# Patient Record
Sex: Male | Born: 1950
Health system: Southern US, Community
[De-identification: ages and names within clinical notes are randomized; demographics above are authoritative.]

## PROBLEM LIST (undated history)

## (undated) DIAGNOSIS — T7840XA Allergy, unspecified, initial encounter: Secondary | ICD-10-CM

## (undated) DIAGNOSIS — M0520 Rheumatoid vasculitis with rheumatoid arthritis of unspecified site: Secondary | ICD-10-CM

## (undated) DIAGNOSIS — R739 Hyperglycemia, unspecified: Secondary | ICD-10-CM

## (undated) DIAGNOSIS — E039 Hypothyroidism, unspecified: Secondary | ICD-10-CM

## (undated) DIAGNOSIS — I10 Essential (primary) hypertension: Secondary | ICD-10-CM

## (undated) DIAGNOSIS — M1990 Unspecified osteoarthritis, unspecified site: Secondary | ICD-10-CM

## (undated) DIAGNOSIS — D869 Sarcoidosis, unspecified: Secondary | ICD-10-CM

## (undated) DIAGNOSIS — G4730 Sleep apnea, unspecified: Secondary | ICD-10-CM

## (undated) DIAGNOSIS — R599 Enlarged lymph nodes, unspecified: Principal | ICD-10-CM

## (undated) DIAGNOSIS — R748 Abnormal levels of other serum enzymes: Secondary | ICD-10-CM

## (undated) DIAGNOSIS — E042 Nontoxic multinodular goiter: Secondary | ICD-10-CM

## (undated) DIAGNOSIS — K219 Gastro-esophageal reflux disease without esophagitis: Secondary | ICD-10-CM

## (undated) DIAGNOSIS — Z0000 Encounter for general adult medical examination without abnormal findings: Secondary | ICD-10-CM

## (undated) HISTORY — DX: Unspecified osteoarthritis, unspecified site: M19.90

## (undated) HISTORY — DX: Rheumatoid vasculitis with rheumatoid arthritis of unspecified site: M05.20

## (undated) HISTORY — PX: POLYPECTOMY: SHX149

## (undated) HISTORY — PX: ROTATOR CUFF REPAIR: SHX139

## (undated) HISTORY — DX: Hyperglycemia, unspecified: R73.9

## (undated) HISTORY — DX: Sarcoidosis, unspecified: D86.9

## (undated) HISTORY — DX: Sleep apnea, unspecified: G47.30

## (undated) HISTORY — PX: COLONOSCOPY: SHX174

## (undated) HISTORY — DX: Allergy, unspecified, initial encounter: T78.40XA

## (undated) HISTORY — DX: Essential (primary) hypertension: I10

## (undated) HISTORY — DX: Nontoxic multinodular goiter: E04.2

## (undated) HISTORY — DX: Hypothyroidism, unspecified: E03.9

## (undated) HISTORY — DX: Encounter for general adult medical examination without abnormal findings: Z00.00

## (undated) HISTORY — DX: Gastro-esophageal reflux disease without esophagitis: K21.9

## (undated) HISTORY — DX: Enlarged lymph nodes, unspecified: R59.9

## (undated) HISTORY — DX: Abnormal levels of other serum enzymes: R74.8

---

## 1997-06-26 ENCOUNTER — Ambulatory Visit: Admission: RE | Admit: 1997-06-26 | Discharge: 1997-06-26 | Payer: Self-pay | Admitting: Otolaryngology

## 1997-08-13 ENCOUNTER — Other Ambulatory Visit: Admission: RE | Admit: 1997-08-13 | Discharge: 1997-08-13 | Payer: Self-pay | Admitting: Urology

## 1997-10-03 ENCOUNTER — Ambulatory Visit: Admission: RE | Admit: 1997-10-03 | Discharge: 1997-10-03 | Payer: Self-pay | Admitting: Otolaryngology

## 1999-10-14 ENCOUNTER — Encounter: Payer: Self-pay | Admitting: Endocrinology

## 1999-10-14 ENCOUNTER — Ambulatory Visit (HOSPITAL_COMMUNITY): Admission: RE | Admit: 1999-10-14 | Discharge: 1999-10-14 | Payer: Self-pay | Admitting: Endocrinology

## 2000-02-28 ENCOUNTER — Encounter: Payer: Self-pay | Admitting: Otolaryngology

## 2000-02-29 ENCOUNTER — Encounter (INDEPENDENT_AMBULATORY_CARE_PROVIDER_SITE_OTHER): Payer: Self-pay | Admitting: *Deleted

## 2000-02-29 ENCOUNTER — Ambulatory Visit (HOSPITAL_COMMUNITY): Admission: RE | Admit: 2000-02-29 | Discharge: 2000-02-29 | Payer: Self-pay | Admitting: Otolaryngology

## 2000-03-13 HISTORY — PX: THYROIDECTOMY: SHX17

## 2000-03-14 ENCOUNTER — Ambulatory Visit (HOSPITAL_COMMUNITY): Admission: RE | Admit: 2000-03-14 | Discharge: 2000-03-14 | Payer: Self-pay | Admitting: Otolaryngology

## 2000-03-28 ENCOUNTER — Encounter (INDEPENDENT_AMBULATORY_CARE_PROVIDER_SITE_OTHER): Payer: Self-pay | Admitting: Specialist

## 2000-03-28 ENCOUNTER — Inpatient Hospital Stay (HOSPITAL_COMMUNITY): Admission: RE | Admit: 2000-03-28 | Discharge: 2000-03-30 | Payer: Self-pay | Admitting: Otolaryngology

## 2000-05-03 ENCOUNTER — Encounter (INDEPENDENT_AMBULATORY_CARE_PROVIDER_SITE_OTHER): Payer: Self-pay | Admitting: Specialist

## 2000-05-04 ENCOUNTER — Inpatient Hospital Stay (HOSPITAL_COMMUNITY): Admission: EM | Admit: 2000-05-04 | Discharge: 2000-05-07 | Payer: Self-pay | Admitting: Internal Medicine

## 2000-05-06 ENCOUNTER — Encounter: Payer: Self-pay | Admitting: Internal Medicine

## 2000-05-07 ENCOUNTER — Encounter: Payer: Self-pay | Admitting: Internal Medicine

## 2004-02-11 ENCOUNTER — Ambulatory Visit: Payer: Self-pay | Admitting: Endocrinology

## 2004-02-12 ENCOUNTER — Ambulatory Visit: Payer: Self-pay | Admitting: Internal Medicine

## 2004-04-05 ENCOUNTER — Ambulatory Visit: Payer: Self-pay | Admitting: Endocrinology

## 2004-05-13 ENCOUNTER — Encounter: Admission: RE | Admit: 2004-05-13 | Discharge: 2004-05-13 | Payer: Self-pay | Admitting: Otolaryngology

## 2004-08-04 ENCOUNTER — Ambulatory Visit: Payer: Self-pay | Admitting: Internal Medicine

## 2004-09-09 ENCOUNTER — Ambulatory Visit: Payer: Self-pay | Admitting: Internal Medicine

## 2004-09-09 ENCOUNTER — Encounter (INDEPENDENT_AMBULATORY_CARE_PROVIDER_SITE_OTHER): Payer: Self-pay | Admitting: Specialist

## 2004-09-09 ENCOUNTER — Inpatient Hospital Stay (HOSPITAL_COMMUNITY): Admission: RE | Admit: 2004-09-09 | Discharge: 2004-09-11 | Payer: Self-pay | Admitting: Otolaryngology

## 2004-12-23 ENCOUNTER — Encounter (INDEPENDENT_AMBULATORY_CARE_PROVIDER_SITE_OTHER): Payer: Self-pay | Admitting: Otolaryngology

## 2004-12-23 ENCOUNTER — Encounter (INDEPENDENT_AMBULATORY_CARE_PROVIDER_SITE_OTHER): Payer: Self-pay | Admitting: Specialist

## 2004-12-23 ENCOUNTER — Ambulatory Visit (HOSPITAL_COMMUNITY): Admission: RE | Admit: 2004-12-23 | Discharge: 2004-12-23 | Payer: Self-pay | Admitting: Otolaryngology

## 2005-01-19 ENCOUNTER — Ambulatory Visit: Payer: Self-pay | Admitting: Oncology

## 2005-02-17 ENCOUNTER — Ambulatory Visit (HOSPITAL_COMMUNITY): Admission: RE | Admit: 2005-02-17 | Discharge: 2005-02-17 | Payer: Self-pay | Admitting: Oncology

## 2005-02-21 ENCOUNTER — Ambulatory Visit: Payer: Self-pay | Admitting: Internal Medicine

## 2005-08-23 ENCOUNTER — Ambulatory Visit: Payer: Self-pay | Admitting: Oncology

## 2005-08-24 ENCOUNTER — Ambulatory Visit (HOSPITAL_COMMUNITY): Admission: RE | Admit: 2005-08-24 | Discharge: 2005-08-24 | Payer: Self-pay | Admitting: Oncology

## 2005-08-25 LAB — CBC WITH DIFFERENTIAL/PLATELET
BASO%: 0.3 % (ref 0.0–2.0)
Basophils Absolute: 0 10*3/uL (ref 0.0–0.1)
EOS%: 2.4 % (ref 0.0–7.0)
HGB: 16.5 g/dL (ref 13.0–17.1)
MCH: 31 pg (ref 28.0–33.4)
MCHC: 34.2 g/dL (ref 32.0–35.9)
MONO#: 0.7 10*3/uL (ref 0.1–0.9)
RDW: 13.7 % (ref 11.2–14.6)
WBC: 8.1 10*3/uL (ref 4.0–10.0)
lymph#: 3.2 10*3/uL (ref 0.9–3.3)

## 2005-08-25 LAB — MORPHOLOGY
PLT EST: ADEQUATE
RBC Comments: NORMAL

## 2005-10-06 ENCOUNTER — Ambulatory Visit: Payer: Self-pay | Admitting: Endocrinology

## 2005-10-23 ENCOUNTER — Ambulatory Visit: Payer: Self-pay | Admitting: Endocrinology

## 2005-11-30 ENCOUNTER — Ambulatory Visit: Payer: Self-pay | Admitting: Endocrinology

## 2005-12-04 ENCOUNTER — Ambulatory Visit: Payer: Self-pay | Admitting: Endocrinology

## 2006-01-16 ENCOUNTER — Ambulatory Visit: Payer: Self-pay | Admitting: Endocrinology

## 2006-01-27 ENCOUNTER — Emergency Department (HOSPITAL_COMMUNITY): Admission: EM | Admit: 2006-01-27 | Discharge: 2006-01-27 | Payer: Self-pay | Admitting: Emergency Medicine

## 2006-02-15 ENCOUNTER — Ambulatory Visit: Payer: Self-pay | Admitting: Oncology

## 2006-03-22 ENCOUNTER — Ambulatory Visit (HOSPITAL_COMMUNITY): Admission: RE | Admit: 2006-03-22 | Discharge: 2006-03-22 | Payer: Self-pay | Admitting: Oncology

## 2006-03-26 ENCOUNTER — Ambulatory Visit: Payer: Self-pay | Admitting: Endocrinology

## 2006-03-26 LAB — CONVERTED CEMR LAB
Hgb A1c MFr Bld: 5.8 % (ref 4.6–6.0)
TSH: 14.99 microintl units/mL — ABNORMAL HIGH (ref 0.35–5.50)

## 2006-04-18 ENCOUNTER — Ambulatory Visit: Payer: Self-pay

## 2006-05-10 ENCOUNTER — Ambulatory Visit: Payer: Self-pay | Admitting: Endocrinology

## 2006-05-10 LAB — CONVERTED CEMR LAB
Basophils Relative: 0.9 % (ref 0.0–1.0)
Eosinophils Absolute: 0.2 10*3/uL (ref 0.0–0.6)
Lymphocytes Relative: 43.9 % (ref 12.0–46.0)
Monocytes Relative: 10.4 % (ref 3.0–11.0)
Neutro Abs: 3.3 10*3/uL (ref 1.4–7.7)
Platelets: 240 10*3/uL (ref 150–400)
TSH: 0.86 microintl units/mL (ref 0.35–5.50)

## 2006-05-28 ENCOUNTER — Ambulatory Visit: Payer: Self-pay | Admitting: Gastroenterology

## 2006-05-29 ENCOUNTER — Ambulatory Visit (HOSPITAL_COMMUNITY): Admission: RE | Admit: 2006-05-29 | Discharge: 2006-05-30 | Payer: Self-pay | Admitting: Orthopedic Surgery

## 2006-06-26 ENCOUNTER — Ambulatory Visit: Payer: Self-pay | Admitting: Endocrinology

## 2006-07-24 ENCOUNTER — Ambulatory Visit: Payer: Self-pay | Admitting: Gastroenterology

## 2006-08-03 ENCOUNTER — Ambulatory Visit: Payer: Self-pay | Admitting: Gastroenterology

## 2006-10-30 ENCOUNTER — Ambulatory Visit: Payer: Self-pay | Admitting: Endocrinology

## 2007-03-21 ENCOUNTER — Ambulatory Visit (HOSPITAL_COMMUNITY): Admission: RE | Admit: 2007-03-21 | Discharge: 2007-03-21 | Payer: Self-pay | Admitting: Oncology

## 2007-03-21 ENCOUNTER — Ambulatory Visit: Payer: Self-pay | Admitting: Oncology

## 2007-03-25 ENCOUNTER — Encounter: Payer: Self-pay | Admitting: Endocrinology

## 2007-05-16 ENCOUNTER — Telehealth: Payer: Self-pay | Admitting: Endocrinology

## 2007-05-29 ENCOUNTER — Ambulatory Visit: Payer: Self-pay | Admitting: Endocrinology

## 2007-05-30 LAB — CONVERTED CEMR LAB
AST: 29 units/L (ref 0–37)
Bilirubin, Direct: 0.2 mg/dL (ref 0.0–0.3)
Cholesterol: 152 mg/dL (ref 0–200)
Eosinophils Absolute: 0.4 10*3/uL (ref 0.0–0.6)
Eosinophils Relative: 3.8 % (ref 0.0–5.0)
GFR calc Af Amer: 90 mL/min
GFR calc non Af Amer: 74 mL/min
Glucose, Bld: 99 mg/dL (ref 70–99)
HCT: 47.4 % (ref 39.0–52.0)
Hemoglobin: 15.2 g/dL (ref 13.0–17.0)
Leukocytes, UA: NEGATIVE
Lymphocytes Relative: 43.6 % (ref 12.0–46.0)
MCV: 94.6 fL (ref 78.0–100.0)
Neutro Abs: 3.8 10*3/uL (ref 1.4–7.7)
Neutrophils Relative %: 39.5 % — ABNORMAL LOW (ref 43.0–77.0)
Nitrite: NEGATIVE
PSA: 0.72 ng/mL (ref 0.10–4.00)
Sodium: 139 meq/L (ref 135–145)
Total CHOL/HDL Ratio: 3.7
Total Protein: 6.9 g/dL (ref 6.0–8.3)
Urobilinogen, UA: 1 (ref 0.0–1.0)
WBC: 9.7 10*3/uL (ref 4.5–10.5)

## 2007-06-06 ENCOUNTER — Ambulatory Visit: Payer: Self-pay | Admitting: Endocrinology

## 2007-06-06 DIAGNOSIS — R7309 Other abnormal glucose: Secondary | ICD-10-CM | POA: Insufficient documentation

## 2007-06-06 DIAGNOSIS — E042 Nontoxic multinodular goiter: Secondary | ICD-10-CM | POA: Insufficient documentation

## 2007-06-06 DIAGNOSIS — J45909 Unspecified asthma, uncomplicated: Secondary | ICD-10-CM | POA: Insufficient documentation

## 2007-06-06 DIAGNOSIS — K649 Unspecified hemorrhoids: Secondary | ICD-10-CM | POA: Insufficient documentation

## 2007-06-06 DIAGNOSIS — J310 Chronic rhinitis: Secondary | ICD-10-CM | POA: Insufficient documentation

## 2007-06-06 DIAGNOSIS — A6000 Herpesviral infection of urogenital system, unspecified: Secondary | ICD-10-CM | POA: Insufficient documentation

## 2007-06-06 DIAGNOSIS — G4730 Sleep apnea, unspecified: Secondary | ICD-10-CM | POA: Insufficient documentation

## 2007-06-06 DIAGNOSIS — D869 Sarcoidosis, unspecified: Secondary | ICD-10-CM | POA: Insufficient documentation

## 2007-12-25 ENCOUNTER — Telehealth (INDEPENDENT_AMBULATORY_CARE_PROVIDER_SITE_OTHER): Payer: Self-pay | Admitting: *Deleted

## 2007-12-26 ENCOUNTER — Ambulatory Visit: Payer: Self-pay | Admitting: Internal Medicine

## 2007-12-27 ENCOUNTER — Ambulatory Visit: Payer: Self-pay | Admitting: Endocrinology

## 2007-12-27 DIAGNOSIS — M5137 Other intervertebral disc degeneration, lumbosacral region: Secondary | ICD-10-CM | POA: Insufficient documentation

## 2007-12-27 DIAGNOSIS — M51379 Other intervertebral disc degeneration, lumbosacral region without mention of lumbar back pain or lower extremity pain: Secondary | ICD-10-CM | POA: Insufficient documentation

## 2007-12-27 LAB — CONVERTED CEMR LAB
Albumin: 4 g/dL (ref 3.5–5.2)
Basophils Absolute: 0 10*3/uL (ref 0.0–0.1)
Basophils Relative: 0.4 % (ref 0.0–3.0)
CO2: 29 meq/L (ref 19–32)
Calcium: 9.5 mg/dL (ref 8.4–10.5)
Chloride: 105 meq/L (ref 96–112)
GFR calc Af Amer: 100 mL/min
Lymphocytes Relative: 40 % (ref 12.0–46.0)
MCHC: 33.7 g/dL (ref 30.0–36.0)
Neutrophils Relative %: 45.4 % (ref 43.0–77.0)
Platelets: 229 10*3/uL (ref 150–400)
Potassium: 4.6 meq/L (ref 3.5–5.1)
RBC: 5.11 M/uL (ref 4.22–5.81)
TSH: 0.18 microintl units/mL — ABNORMAL LOW (ref 0.35–5.50)
Total Protein: 7.1 g/dL (ref 6.0–8.3)
WBC: 9.3 10*3/uL (ref 4.5–10.5)

## 2007-12-30 LAB — CONVERTED CEMR LAB: Angiotensin 1 Converting Enzyme: 70 units/L — ABNORMAL HIGH (ref 9–67)

## 2008-01-03 ENCOUNTER — Telehealth (INDEPENDENT_AMBULATORY_CARE_PROVIDER_SITE_OTHER): Payer: Self-pay | Admitting: *Deleted

## 2008-02-13 ENCOUNTER — Ambulatory Visit: Payer: Self-pay | Admitting: Internal Medicine

## 2008-02-13 DIAGNOSIS — R635 Abnormal weight gain: Secondary | ICD-10-CM | POA: Insufficient documentation

## 2008-02-14 ENCOUNTER — Telehealth: Payer: Self-pay | Admitting: Endocrinology

## 2008-02-25 ENCOUNTER — Ambulatory Visit (HOSPITAL_COMMUNITY): Admission: RE | Admit: 2008-02-25 | Discharge: 2008-02-25 | Payer: Self-pay | Admitting: Oncology

## 2008-03-23 ENCOUNTER — Encounter: Payer: Self-pay | Admitting: Endocrinology

## 2008-03-23 ENCOUNTER — Ambulatory Visit: Payer: Self-pay | Admitting: Oncology

## 2008-04-16 ENCOUNTER — Encounter: Payer: Self-pay | Admitting: Endocrinology

## 2008-04-22 ENCOUNTER — Encounter: Admission: RE | Admit: 2008-04-22 | Discharge: 2008-04-22 | Payer: Self-pay | Admitting: Otolaryngology

## 2008-06-19 ENCOUNTER — Ambulatory Visit: Payer: Self-pay | Admitting: Internal Medicine

## 2008-07-06 ENCOUNTER — Ambulatory Visit: Payer: Self-pay | Admitting: Endocrinology

## 2008-07-06 LAB — CONVERTED CEMR LAB
ALT: 26 units/L (ref 0–53)
Bilirubin Urine: NEGATIVE
Calcium: 9.1 mg/dL (ref 8.4–10.5)
Cholesterol: 146 mg/dL (ref 0–200)
Eosinophils Relative: 4.2 % (ref 0.0–5.0)
GFR calc non Af Amer: 99.38 mL/min (ref >60)
HCT: 48.2 % (ref 39.0–52.0)
HDL: 39.6 mg/dL (ref >39.00)
Hemoglobin, Urine: NEGATIVE
Ketones, ur: NEGATIVE mg/dL
Leukocytes, UA: NEGATIVE
Lymphs Abs: 3.6 10*3/uL (ref 0.7–4.0)
MCV: 93.3 fL (ref 78.0–100.0)
Monocytes Absolute: 0.9 10*3/uL (ref 0.1–1.0)
PSA: 0.84 ng/mL (ref 0.10–4.00)
Platelets: 194 10*3/uL (ref 150.0–400.0)
RDW: 12.8 % (ref 11.5–14.6)
Sodium: 143 meq/L (ref 135–145)
Specific Gravity, Urine: 1.01 (ref 1.000–1.030)
TSH: 11.91 microintl units/mL — ABNORMAL HIGH (ref 0.35–5.50)
Total Bilirubin: 1.3 mg/dL — ABNORMAL HIGH (ref 0.3–1.2)
Triglycerides: 41 mg/dL (ref 0.0–149.0)
Urobilinogen, UA: 0.2 (ref 0.0–1.0)
WBC: 7.4 10*3/uL (ref 4.5–10.5)

## 2008-07-07 ENCOUNTER — Ambulatory Visit: Payer: Self-pay | Admitting: Endocrinology

## 2008-07-07 DIAGNOSIS — N401 Enlarged prostate with lower urinary tract symptoms: Secondary | ICD-10-CM | POA: Insufficient documentation

## 2008-07-07 DIAGNOSIS — E890 Postprocedural hypothyroidism: Secondary | ICD-10-CM | POA: Insufficient documentation

## 2008-11-02 ENCOUNTER — Ambulatory Visit: Payer: Self-pay | Admitting: Internal Medicine

## 2008-11-10 ENCOUNTER — Ambulatory Visit: Payer: Self-pay | Admitting: Internal Medicine

## 2008-11-10 DIAGNOSIS — L559 Sunburn, unspecified: Secondary | ICD-10-CM | POA: Insufficient documentation

## 2008-11-24 ENCOUNTER — Ambulatory Visit: Payer: Self-pay | Admitting: Endocrinology

## 2008-11-24 DIAGNOSIS — I10 Essential (primary) hypertension: Secondary | ICD-10-CM | POA: Insufficient documentation

## 2008-11-27 ENCOUNTER — Telehealth (INDEPENDENT_AMBULATORY_CARE_PROVIDER_SITE_OTHER): Payer: Self-pay | Admitting: *Deleted

## 2008-11-27 LAB — CONVERTED CEMR LAB: Hgb A1c MFr Bld: 5.5 % (ref 4.6–6.5)

## 2009-01-25 ENCOUNTER — Ambulatory Visit: Payer: Self-pay | Admitting: Internal Medicine

## 2009-02-08 ENCOUNTER — Telehealth (INDEPENDENT_AMBULATORY_CARE_PROVIDER_SITE_OTHER): Payer: Self-pay | Admitting: *Deleted

## 2009-02-09 ENCOUNTER — Ambulatory Visit: Payer: Self-pay | Admitting: Endocrinology

## 2009-02-15 ENCOUNTER — Encounter: Payer: Self-pay | Admitting: Endocrinology

## 2009-02-19 ENCOUNTER — Ambulatory Visit: Payer: Self-pay | Admitting: Pulmonary Disease

## 2009-02-19 ENCOUNTER — Inpatient Hospital Stay (HOSPITAL_COMMUNITY): Admission: RE | Admit: 2009-02-19 | Discharge: 2009-02-21 | Payer: Self-pay | Admitting: Orthopedic Surgery

## 2009-03-18 ENCOUNTER — Ambulatory Visit: Payer: Self-pay | Admitting: Oncology

## 2009-03-24 ENCOUNTER — Ambulatory Visit (HOSPITAL_COMMUNITY): Admission: RE | Admit: 2009-03-24 | Discharge: 2009-03-24 | Payer: Self-pay | Admitting: Oncology

## 2009-03-29 ENCOUNTER — Encounter: Payer: Self-pay | Admitting: Endocrinology

## 2009-03-29 LAB — CBC WITH DIFFERENTIAL/PLATELET
BASO%: 0.4 % (ref 0.0–2.0)
Basophils Absolute: 0 10*3/uL (ref 0.0–0.1)
EOS%: 2.6 % (ref 0.0–7.0)
HGB: 17.6 g/dL — ABNORMAL HIGH (ref 13.0–17.1)
MCH: 31.2 pg (ref 27.2–33.4)
MONO#: 1 10*3/uL — ABNORMAL HIGH (ref 0.1–0.9)
RDW: 14.3 % (ref 11.0–14.6)
WBC: 9.1 10*3/uL (ref 4.0–10.3)
lymph#: 3.8 10*3/uL — ABNORMAL HIGH (ref 0.9–3.3)

## 2009-03-29 LAB — MORPHOLOGY: PLT EST: ADEQUATE

## 2009-04-15 ENCOUNTER — Telehealth: Payer: Self-pay | Admitting: Endocrinology

## 2010-01-17 ENCOUNTER — Telehealth: Payer: Self-pay | Admitting: Endocrinology

## 2010-02-15 ENCOUNTER — Ambulatory Visit: Payer: Self-pay | Admitting: Endocrinology

## 2010-02-15 LAB — CONVERTED CEMR LAB
Albumin: 3.9 g/dL (ref 3.5–5.2)
Alkaline Phosphatase: 68 units/L (ref 39–117)
BUN: 17 mg/dL (ref 6–23)
Basophils Absolute: 0 10*3/uL (ref 0.0–0.1)
Bilirubin Urine: NEGATIVE
Bilirubin, Direct: 0.2 mg/dL (ref 0.0–0.3)
CO2: 31 meq/L (ref 19–32)
Calcium: 9.1 mg/dL (ref 8.4–10.5)
Cholesterol: 139 mg/dL (ref 0–200)
Creatinine, Ser: 1.1 mg/dL (ref 0.4–1.5)
Eosinophils Absolute: 0.4 10*3/uL (ref 0.0–0.7)
Glucose, Bld: 93 mg/dL (ref 70–99)
HDL: 36.5 mg/dL — ABNORMAL LOW (ref >39.00)
Ketones, ur: NEGATIVE mg/dL
LDL Cholesterol: 95 mg/dL (ref 0–99)
Leukocytes, UA: NEGATIVE
Lymphocytes Relative: 41.7 % (ref 12.0–46.0)
MCHC: 34.8 g/dL (ref 30.0–36.0)
Monocytes Absolute: 1 10*3/uL (ref 0.1–1.0)
Neutrophils Relative %: 42.6 % — ABNORMAL LOW (ref 43.0–77.0)
RDW: 14 % (ref 11.5–14.6)
Total CHOL/HDL Ratio: 4
Triglycerides: 39 mg/dL (ref 0.0–149.0)
Urine Glucose: NEGATIVE mg/dL
pH: 7 (ref 5.0–8.0)

## 2010-02-18 ENCOUNTER — Ambulatory Visit: Payer: Self-pay | Admitting: Endocrinology

## 2010-02-22 ENCOUNTER — Encounter: Payer: Self-pay | Admitting: Endocrinology

## 2010-03-17 ENCOUNTER — Ambulatory Visit: Payer: Self-pay | Admitting: Oncology

## 2010-03-21 ENCOUNTER — Ambulatory Visit (HOSPITAL_COMMUNITY)
Admission: RE | Admit: 2010-03-21 | Discharge: 2010-03-21 | Payer: Self-pay | Source: Home / Self Care | Attending: Oncology | Admitting: Oncology

## 2010-03-28 ENCOUNTER — Encounter: Payer: Self-pay | Admitting: Endocrinology

## 2010-03-28 LAB — CBC WITH DIFFERENTIAL/PLATELET
BASO%: 0.5 % (ref 0.0–2.0)
Basophils Absolute: 0 10*3/uL (ref 0.0–0.1)
EOS%: 3.8 % (ref 0.0–7.0)
Eosinophils Absolute: 0.3 10*3/uL (ref 0.0–0.5)
HCT: 48.7 % (ref 38.4–49.9)
HGB: 16.7 g/dL (ref 13.0–17.1)
LYMPH%: 45.4 % (ref 14.0–49.0)
MCH: 31.8 pg (ref 27.2–33.4)
MCHC: 34.2 g/dL (ref 32.0–36.0)
MCV: 92.8 fL (ref 79.3–98.0)
MONO#: 0.8 10*3/uL (ref 0.1–0.9)
MONO%: 9.9 % (ref 0.0–14.0)
NEUT#: 3.1 10*3/uL (ref 1.5–6.5)
NEUT%: 40.4 % (ref 39.0–75.0)
Platelets: 204 10*3/uL (ref 140–400)
RBC: 5.25 10*6/uL (ref 4.20–5.82)
RDW: 14.6 % (ref 11.0–14.6)
WBC: 7.7 10*3/uL (ref 4.0–10.3)
lymph#: 3.5 10*3/uL — ABNORMAL HIGH (ref 0.9–3.3)

## 2010-03-28 LAB — COMPREHENSIVE METABOLIC PANEL
ALT: 26 U/L (ref 0–53)
AST: 36 U/L (ref 0–37)
Albumin: 4.5 g/dL (ref 3.5–5.2)
Alkaline Phosphatase: 71 U/L (ref 39–117)
BUN: 14 mg/dL (ref 6–23)
CO2: 28 mEq/L (ref 19–32)
Calcium: 9.4 mg/dL (ref 8.4–10.5)
Chloride: 105 mEq/L (ref 96–112)
Creatinine, Ser: 1.14 mg/dL (ref 0.40–1.50)
Glucose, Bld: 89 mg/dL (ref 70–99)
Potassium: 4.1 mEq/L (ref 3.5–5.3)
Sodium: 140 mEq/L (ref 135–145)
Total Bilirubin: 0.8 mg/dL (ref 0.3–1.2)
Total Protein: 7 g/dL (ref 6.0–8.3)

## 2010-03-28 LAB — LACTATE DEHYDROGENASE: LDH: 216 U/L (ref 94–250)

## 2010-04-03 ENCOUNTER — Encounter: Payer: Self-pay | Admitting: Oncology

## 2010-04-12 NOTE — Progress Notes (Signed)
Summary: rx  Phone Note Call from Patient Call back at Home Phone [phone]   Caller: Patient Summary of Call: need rx resent for LEVOTHYROXINE SODIUM 125 MCG TABS (LEVOTHYROXINE SODIUM) qd  #30 x 11.Thayer Headings stated pharmacy did not get   Entered and Authorized by:   Minus Breeding MD   Signed by:   Minus Breeding MD on 12/27/2007   Method used:   Electronically to        [facility] #[id number]* (retail)       [address] [place], [place]  [address]       Ph: [phone]       Fax: [phone] Initial call taken by: Shelbie Proctor,  January 03, 2008 8:52 AM  Follow-up for Phone Call        i have resent Follow-up by: Minus Breeding MD,  January 03, 2008 9:54 AM  Additional Follow-up for Phone Call Additional follow up Details #1::        called pt to inform spoke with pt made aware Additional Follow-up by: Shelbie Proctor,  January 03, 2008 10:57 AM      Prescriptions: LEVOTHYROXINE SODIUM 125 MCG TABS (LEVOTHYROXINE SODIUM) qd  #30 x 11   Entered and Authorized by:   Minus Breeding MD   Signed by:   Minus Breeding MD on 01/03/2008   Method used:   Electronically to        [facility] #[id number]* (retail)       [address] [place], [place]  [address]       Ph: [phone]       Fax: [phone]   RxID:   [id number]

## 2010-04-12 NOTE — Letter (Signed)
Summary: [facility]  [facility]   Imported By: Esmeralda Links D'jimraou 04/17/2007 11:20:16  _____________________________________________________________________  External Attachment:    Type:   Image     Comment:   External Document

## 2010-04-12 NOTE — Progress Notes (Signed)
Summary: back brace  Phone Note Call from Patient   Caller: Patient [phone] Summary of Call: pt called stating that Rx written for back brace was lost, okay to re-print for MD's signiture? Initial call taken by: Crissie Sickles, [place],  April 15, 2009 8:28 AM  Follow-up for Phone Call        i printed Follow-up by: Donavan Foil MD,  April 15, 2009 8:45 AM  Additional Follow-up for Phone Call Additional follow up Details #1::        pt informed via VM. told to call back with any further questions or concerns. rx in [place] for pt pick up Additional Follow-up by: Crissie Sickles, CMA,  April 15, 2009 9:30 AM    Prescriptions: BACK  BRACE 722.52   #1 x 0   Entered and Authorized by:   Donavan Foil MD   Signed by:   Donavan Foil MD on 04/15/2009   Method used:   Print then Give to Patient   RxID:   [id number]

## 2010-04-12 NOTE — Progress Notes (Signed)
Summary: Lab  order   Phone Note Call from Patient   Caller: Patient Summary of Call: Patient called requesting lab order to have thyroid level rechecked. Please advise Initial call taken by: Rock Nephew CMA,  May 16, 2007 9:51 AM  Follow-up for Phone Call        looks like it has been 1 year since last ov.  please advise ov--cpx if you wish Follow-up by: Minus Breeding MD,  May 16, 2007 11:15 AM  Additional Follow-up for Phone Call Additional follow up Details #1::        Returned call to patient to advise per MD//LMOVM to call back Additional Follow-up by: Rock Nephew CMA,  May 16, 2007 2:11 PM    Additional Follow-up for Phone Call Additional follow up Details #2::    LMOVM per MD Follow-up by: Rock Nephew CMA,  May 17, 2007 11:13 AM

## 2010-04-12 NOTE — Consult Note (Signed)
Summary: Thyroid neoplasm [facility]  Thyroid neoplasm [facility]ign/[facility]   Imported By: Lester Stanley 04/27/2008 10:56:36  _____________________________________________________________________  External Attachment:    Type:   Image     Comment:   External Document

## 2010-04-12 NOTE — Progress Notes (Signed)
Summary: levothyroxine  Phone Note Refill Request Message from:  Fax from Pharmacy on February 14, 2008 8:38 AM  Refills Requested: Medication #1:  LEVOTHYROXINE SODIUM 125 MCG TABS qd. [facility] pharm [phone]   Method Requested: Fax to Local Pharmacy Initial call taken by: Orlan Leavens,  February 14, 2008 8:39 AM      Prescriptions: LEVOTHYROXINE SODIUM 125 MCG TABS (LEVOTHYROXINE SODIUM) qd  #90 x 3   Entered by:   Orlan Leavens   Authorized by:   Minus Breeding MD   Signed by:   Orlan Leavens on 02/14/2008   Method used:   Electronically to        [facility]* (retail)       [address]       [place], [place]  [address]       Ph: [phone]       Fax: [phone]   RxID:   [id number]

## 2010-04-12 NOTE — Letter (Signed)
Summary: [facility]   Imported By: Rise Patience 04/15/2009 14:25:24  _____________________________________________________________________  External Attachment:    Type:   Image     Comment:   External Document

## 2010-04-12 NOTE — Assessment & Plan Note (Signed)
Summary: PER CHRISTY--PAPER WORK  --STC   Vital Signs:  Patient profile:   60 year old male Height:      67 inches (170.18 cm) Weight:      206.25 pounds (93.75 kg) O2 Sat:      95 % on Room air Temp:     97.8 degrees F (36.56 degrees C) oral Pulse rate:   89 / minute BP sitting:   140 / 78  (left arm) Cuff size:   large  Vitals Entered By: Josph Macho CMA (February 09, 2009 2:00 PM)  O2 Flow:  Room air CC: Follow-up visit/ pt needs paperwork filled out/ pt denies flu shot/ CF Is Patient Diabetic? No   Referring Provider:  Self Primary Provider:  Dr. Romero Belling  CC:  Follow-up visit/ pt needs paperwork filled out/ pt denies flu shot/ CF.  History of Present Illness: pt is scheduled for right shoulder surgery on 02/19/09.  pt states he feels well in general. synthroid was increased 2 1/2 mos ago. cxr 2 weeks ago was unchanged. mildly elevated bp is noted today.  Current Medications (verified): 1)  Acyclovir 800 Mg Tabs (Acyclovir) .... Take 1 By Mouth Once Daily Physical Is Due No Addtional Refills Until Appt 2)  Back  Brace 722.52 3)  Xyzal 5 Mg Tabs (Levocetirizine Dihydrochloride) .... Once Daily As Needed For Icthing 4)  Levothyroxine Sodium 175 Mcg Tabs (Levothyroxine Sodium) .Marland Kitchen.. 1 Qd  Allergies (verified): 1)  ! * Seafood  Past History:  Past Medical History: Last updated: 01/25/2009 ROUTINE GENERAL MEDICAL EXAM@HEALTH  CARE FACL (ICD-V70.0).........Marland KitchenEllison UNSPECIFIED GENITAL HERPES (ICD-054.10) ALLERGIC RHINITIS (ICD-477.9) SLEEP APNEA (ICD-780.57) SARCOIDOSIS (ICD-135)....................................................................................Marland KitchenWert    - Dx by TBBx 2/02    - PFT's nl January 25, 2009  HYPOTHYROIDISM (ICD-244.9) GOITER, MULTINODULAR (ICD-241.1) HEMORRHOIDS (ICD-455.6) HYPERGLYCEMIA (ICD-790.29)  Review of Systems  The patient denies chest pain and dyspnea on exertion.         denes numbness  Physical  Exam  General:  normal appearance.   Lungs:  Clear to auscultation bilaterally. Normal respiratory effort.  Heart:  Regular rate and rhythm without murmurs or gallops noted. Normal S1,S2.   Additional Exam:  TSH          8.81 uIU/mL  (sept 2010)   Impression & Recommendations:  Problem # 1:  HYPERTENSION (ICD-401.9) prob has situational component  Problem # 2:  HYPOTHYROIDISM, POSTSURGICAL (ICD-244.0)  Problem # 3:  SARCOIDOSIS (ICD-135) stable  Other Orders: Est. Patient Level IV ([address])  Patient Instructions: 1)  his surgical risk is low and outweighed by the potential benefit of the surgery.  he is therefore medically cleared. 2)  we'll follow the elevated bp for now. 3)  cc dr Ranell Patrick

## 2010-04-12 NOTE — Assessment & Plan Note (Signed)
Summary: 2 wks f/u $50/cde   Vital Signs:  Patient profile:   61 year old male Height:      67 inches Weight:      206 pounds BMI:     32.38 O2 Sat:      97 % on Room air Temp:     97.0 degrees F oral Pulse rate:   61 / minute BP sitting:   142 / 78  (left arm) Cuff size:   large  Vitals Entered By: Bill Salinas CMA (November 24, 2008 9:31 AM)  O2 Flow:  Room air CC: 2 week follow up/ ab   Referring Provider:  Self Primary Provider:  Dr. Romero Belling  CC:  2 week follow up/ ab.  History of Present Illness: he feels much better since recent sunburn episode.   he does not have h/o htn  Current Medications (verified): 1)  Acyclovir 800 Mg Tabs (Acyclovir) .... Take 1 By Mouth Once Daily Physical Is Due No Addtional Refills Until Appt 2)  Levothyroxine Sodium 150 Mcg Tabs (Levothyroxine Sodium) .Marland Kitchen.. 1 Qd 3)  Back  Brace 722.52 4)  Xyzal 5 Mg Tabs (Levocetirizine Dihydrochloride) .... Once Daily As Needed For Icthing  Allergies (verified): 1)  ! * Seafood  Past History:  Past Medical History: Last updated: 11/02/2008 ROUTINE GENERAL MEDICAL EXAM@HEALTH  CARE FACL (ICD-V70.0).........Marland KitchenEverardo All ASTHMA (ICD-493.90) UNSPECIFIED GENITAL HERPES (ICD-054.10) ALLERGIC RHINITIS (ICD-477.9) SLEEP APNEA (ICD-780.57) SARCOIDOSIS (ICD-135)...................................................................................Marland KitchenWert    - Dx by TBBx 2/02 HYPOTHYROIDISM (ICD-244.9) GOITER, MULTINODULAR (ICD-241.1) HEMORRHOIDS (ICD-455.6) HYPERGLYCEMIA (ICD-790.29)  Review of Systems  The patient denies weight loss and weight gain.    Physical Exam  General:  normal appearance.   Neck:  Supple without thyroid enlargement or tenderness. No cervical lymphadenopathy, neck masses or tracheal deviation.  Skin:  Normal turgor and pigmentation without lesions or rashes.     Impression & Recommendations:  Problem # 1:  HYPERTENSION (ICD-401.9)  Problem # 2:   sunburn better  Medications Added to Medication List This Visit: 1)  Levothyroxine Sodium 175 Mcg Tabs (Levothyroxine sodium) .Marland Kitchen.. 1 qd  Other Orders: TLB-TSH (Thyroid Stimulating Hormone) (84443-TSH) TLB-A1C / Hgb A1C (Glycohemoglobin) (83036-A1C) Est. Patient Level III ([address])  Patient Instructions: 1)  we'll follow bp for now 2)  same medications 3)  tests are being ordered for you today.  a few days after the test(s), please call [phone] to hear your test results.  this is very important to do because the results may change the instructions you see here 4)  physical next year. Prescriptions: LEVOTHYROXINE SODIUM 175 MCG TABS (LEVOTHYROXINE SODIUM) 1 qd  #30 x 11   Entered and Authorized by:   Minus Breeding MD   Signed by:   Minus Breeding MD on 11/24/2008   Method used:   Electronically to        [facility] #[id number]* (retail)       [address] [place], [place]  [address]       Ph: [phone]       Fax: [phone]   RxID:   [id number]

## 2010-04-12 NOTE — Assessment & Plan Note (Signed)
Summary: Pulmonary/ sarcoidosis fu   Visit Type:  Initial Consult Referred by:  Self PCP:  Dr. Romero Belling  Chief Complaint:  Sarcoid.  History of Present Illness: 75 yobm never smoker with sarcoidosis dx by FOB    December 26, 2007 seen in pulmonary clinic for indolent onset increasing doe x sev  months , just walking down the hall.  no cough fever, chills sweats, wt loss, rash or aches or pains  only new problem is 10 lbs wt gain and working out less. Pt denies any significant sore throat, nasal congestion or excess secretions, fever, chills, sweats, unintended wt loss, pleuritic or exertional cp, orthopnea pnd or leg swelling.  Pt also denies any obvious fluctuation in symptoms with weather or environmental change or other alleviating or aggravating factors.          Updated Prior Medication List: LEVOTHYROXINE SODIUM 150 MCG  TABS (LEVOTHYROXINE SODIUM) TAKE 1 by mouth once daily NEED FOLLOW-[facility] PPT FOR ADDTIONAL REFILLS  Current Allergies (reviewed today): ! * SEAFOOD  Past Medical History:    Reviewed history from 06/06/2007 and no changes required:       ROUTINE GENERAL MEDICAL EXAM@HEALTH  CARE FACL (ICD-V70.0)       ASTHMA (ICD-493.90)       UNSPECIFIED GENITAL HERPES (ICD-054.10)       ALLERGIC RHINITIS (ICD-477.9)       SLEEP APNEA (ICD-780.57)       SARCOIDOSIS (ICD-135)          - Dx by TBBx 2/02       HYPOTHYROIDISM (ICD-244.9)       GOITER, MULTINODULAR (ICD-241.1)       HEMORRHOIDS (ICD-455.6)       HYPERGLYCEMIA (ICD-790.29)          Family History:    Cousins have asthma    neg sarcoid  Social History:    married, children    works [employer]    Never smoker    ETOH 3 times a month        Review of Systems  The patient denies anorexia, fever, weight loss, weight gain, vision loss, decreased hearing, hoarseness, chest pain, syncope, peripheral edema, prolonged cough, headaches, hemoptysis, abdominal pain, melena, hematochezia,  severe indigestion/heartburn, hematuria, incontinence, muscle weakness, suspicious skin lesions, transient blindness, difficulty walking, depression, unusual weight change, abnormal bleeding, enlarged lymph nodes, and angioedema.     Vital Signs:  Patient Profile:   60 Years Old Male Weight:      200.50 pounds O2 Sat:      98 % O2 treatment:    Room Air Temp:     98.4 degrees F oral Pulse rate:   84 / minute BP sitting:   110 / 68  (left arm)  Vitals Entered By: Vernie Murders (December 26, 2007 10:38 AM)                 Physical Exam  wt 198 > 200 Ambulatory healthy appearing in no acute distress. Afeb with normal vital signs HEENT: nl dentition, turbinates, and orophanx. Nl external ear canals without cough reflex Neck without JVD/Nodes/TM Lungs clear to A and P bilaterally without cough on insp or exp maneuvers RRR no s3 or murmur or increase in P2 Abd soft and benign with nl excursion in the supine position. No bruits or organomegaly Ext warm without calf tenderness, cyanosis clubbing or edema Skin warm and dry without lesions        Problem # 1:  SARCOIDOSIS (ICD-135) no evidence or progression or recurrence, needs pft's to complete w/u  Suspect his doe is related to wt gain and deconditioning  Problem # 2:  HYPOTHYROIDISM (ICD-244.9) TSH is a bit low, defer rx adjustments in thyroid replacement to Dr Everardo All His updated medication list for this problem includes:    Levothyroxine Sodium 150 Mcg Tabs (Levothyroxine sodium) .Marland Kitchen... Take 1 by mouth once daily need follow-upa ppt for addtional refills    Patient Instructions: 1)  Please schedule a follow-up appointment in 1 month. 2)  Weight control is simply a matter of calorie balance which needs to be tilted in your favor by eating less and exercising more.  To get the most out of exercise, you need to be continuously aware that you are short of breath, but never out of breath, for 30 minutes daily. As you improve,  it will actually be easier for you to do the same amount in  30 minutes so always push to the level where you are short of breath.  If this does not result in gradual weight reduction,  I recommend  a nutritionist for a food diary   ]  Appended Document: Pulmonary/ sarcoidosis fu apparently did not go for cxr - needs cxr and pft's on his return visit to complete the work up  Appended Document: Pulmonary/ sarcoidosis fu lmomtcb

## 2010-04-12 NOTE — Progress Notes (Signed)
Summary: Rx refill req  Phone Note Refill Request Message from:  Patient on January 17, 2010 2:15 PM  Refills Requested: Medication #1:  ACYCLOVIR 800 MG TABS TAKE 1 by mouth once daily PHYSICAL IS DUE NO ADDTIONAL REFILLS UNTIL APPT   Dosage confirmed as above?Dosage Confirmed   Supply Requested: 6 months Pt req refill to [address]   Method Requested: Electronic Initial call taken by: Crissie Sickles, CMA,  January 17, 2010 2:16 PM  Follow-up for Phone Call        Pt has scheduled CPX Follow-up by: Crissie Sickles, CMA,  January 17, 2010 2:17 PM    Prescriptions: ACYCLOVIR 800 MG TABS (ACYCLOVIR) TAKE 1 by mouth once daily PHYSICAL IS DUE NO ADDTIONAL REFILLS UNTIL APPT  #90 x 1   Entered by:   Crissie Sickles, CMA   Authorized by:   Donavan Foil MD   Signed by:   Crissie Sickles, CMA on 01/17/2010   Method used:   Electronically to        [facility] #[id number]* (retail)       [place], [place]  [address]       Ph: [phone]       Fax: [phone]   RxID:   [id number]

## 2010-04-14 NOTE — Assessment & Plan Note (Signed)
Summary: cpx-lb   Vital Signs:  Patient profile:   60 year old male Height:      67 inches (170.18 cm) Weight:      210.13 pounds (95.51 kg) BMI:     33.03 O2 Sat:      98 % on Room air Temp:     98.5 degrees F (36.94 degrees C) oral Pulse rate:   83 / minute Pulse rhythm:   regular BP sitting:   120 / 70  (left arm) Cuff size:   large  Vitals Entered By: Rebeca Alert CMA Deborra Medina) (February 22, 2010 1:36 PM)  O2 Flow:  Room air CC: CPX/pt declined flu shot/Refill on Levothyroxine/test to find out blood type/aj Is Patient Diabetic? No   Referring Provider:  Self Primary Provider:  Dr. Renato Shin  CC:  CPX/pt declined flu shot/Refill on Levothyroxine/test to find out blood type/aj.  History of Present Illness: here for regular wellness examination.  He's feeling pretty well in general, and does not smoke.  alcohol is rare.    Current Medications (verified): 1)  Acyclovir 800 Mg Tabs (Acyclovir) .... Take 1 By Mouth Once Daily Physical Is Due No Addtional Refills Until Appt 2)  Back  Brace 722.52 3)  Levothyroxine Sodium 175 Mcg Tabs (Levothyroxine Sodium) .Marland Kitchen.. 1 Qd  Allergies (verified): 1)  ! * Seafood  Past History:  Past Medical History: ROUTINE GENERAL MEDICAL EXAM@HEALTH  CARE FACL (ICD-V70.0).........Marland KitchenEllison UNSPECIFIED GENITAL HERPES (ICD-054.10) ALLERGIC RHINITIS (ICD-477.9) SLEEP APNEA (ICD-780.57) SARCOIDOSIS (ICD-135)....................................................................................Marland KitchenWert    - Dx by TBBx 2/02    - PFT's nl January 25, 2009  HYPOTHYROIDISM (ICD-244.9) GOITER, MULTINODULAR (ICD-241.1) HEMORRHOIDS (ICD-455.6) HYPERGLYCEMIA (ICD-790.29)  pulm: wert oncol: granfortuna urol: kimbrough  Family History: Reviewed history from 12/26/2007 and no changes required. Cousins have asthma neg sarcoid  Social History: Reviewed history from 07/07/2008 and no changes required. 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         he has a slight cough  Physical Exam  General:  normal appearance.   Head:  head: no deformity eyes: no periorbital swelling, no proptosis external nose and ears are normal mouth: no lesion seen Neck:  a healed scar is present.  i do not appreciate a nodule in the thyroid or elsewhere in the neck  Lungs:  Clear to auscultation bilaterally. Normal respiratory effort.  Heart:  Regular rate and rhythm without murmurs or gallops noted. Normal S1,S2.   Abdomen:  abdomen is soft, nontender.  no hepatosplenomegaly.   not distended.  no hernia  Rectal:  normal external and internal exam.  heme neg  Prostate:  Normal size prostate without masses or tenderness.  Msk:  muscle bulk and strength are grossly normal.  no obvious joint swelling.  gait is normal and steady there are several surgical scars on both shoulders Pulses:  dorsalis pedis intact bilat.  no carotid bruit  Extremities:  no deformity.  no ulcer on the feet.  feet are of normal color and temp.  no edema  Neurologic:  sensation is intact to touch on the feet. Skin:  normal texture and temp. not diaphoretic  Cervical Nodes:  No significant adenopathy.  Psych:  Alert and cooperative; normal mood and affect; normal attention span and concentration.  Additional Exam:  SEPARATE EVALUATION FOLLOWS--EACH PROBLEM HERE IS NEW, NOT RESPONDING TO TREATMENT, OR POSES SIGNIFICANT RISK TO THE PATIENT'S HEALTH: HISTORY OF THE PRESENT ILLNESS: pt states 1-2 mos of slight rash on the hands and feet, and assoc itching PAST MEDICAL HISTORY reviewed and up to date today REVIEW OF SYSTEMS: he has toenail  fungus PHYSICAL EXAMINATION: mild eczematous rash of dorsal aspects of the hands and feet IMPRESSION: rash.  uncertain etiology onychomycosis of the toenails PLAN: see instruction sheet   Impression & Recommendations:  Problem # 1:  ROUTINE GENERAL MEDICAL EXAM@HEALTH  CARE FACL (ICD-V70.0)  Medications Added to Medication List This Visit: 1)  Acyclovir 800 Mg Tabs (Acyclovir) .... Take 1 by mouth once daily 2)  Clotrimazole-betamethasone 1-0.05 % Crea (Clotrimazole-betamethasone) .... Apply to hands three times a day as needed for rash 3)  Terbinafine Hcl 250 Mg Tabs (Terbinafine hcl) .Marland Kitchen.. 1 tab once daily  Other Orders: Est. Patient Level III (67672) Est. Patient 40-64 years ([address])   Patient Instructions: 1)  clotrimazole-betamethasone cream three times a day to the hands and feet as needed for rash. 2)  take terbinafine 250 mg once daily, x 90 days 3)  please consider these measures for your health:  minimize alcohol.  do not use tobacco products.  have a colonoscopy at least every 10 years from age 65.  keep firearms safely stored.  always use seat belts.  have working smoke alarms in your home.  see an eye doctor and dentist regularly.  never drive under the influence of alcohol or drugs (including prescription drugs).  Prescriptions: TERBINAFINE HCL 250 MG TABS (TERBINAFINE HCL) 1 tab once daily  #90 x 0   Entered and Authorized by:   Donavan Foil MD   Signed by:   Donavan Foil MD on 02/22/2010   Method used:   Electronically to        [facility] [phone]* (retail)       [address] [place], [place]  [address]       Ph: [phone]       Fax: [phone]   RxID:   [id number] ACYCLOVIR 800 MG TABS (ACYCLOVIR) TAKE 1 by mouth once daily  #90 x 3   Entered and Authorized by:   Donavan Foil MD   Signed by:   Donavan Foil MD on 02/22/2010   Method used:   Electronically to        [facility] #[id number]*  (retail)       [place], [place]  [address]       Ph: [phone]       Fax: [phone]   RxID:   [id number] LEVOTHYROXINE SODIUM 175 MCG TABS (LEVOTHYROXINE SODIUM) 1 qd  #90 x 3   Entered and Authorized by:   Donavan Foil MD   Signed by:   Donavan Foil MD on 02/22/2010   Method used:   Electronically to        [facility]. #[id number]* (retail)       [place],   [address]       Ph: [phone]       Fax: [phone]   RxID:   [id number] CLOTRIMAZOLE-BETAMETHASONE 1-0.05 % CREA (CLOTRIMAZOLE-BETAMETHASONE) apply to hands three times a day as needed  for rash  #1 med tube x 3   Entered and Authorized by:   Donavan Foil MD   Signed by:   Donavan Foil MD on 02/22/2010   Method used:   Electronically to        [facility] #[id number]* (retail)       [place], [place]  [address]       Ph: [phone]       Fax: [phone]   RxID:   [id number]    Orders Added: 1)  Est. Patient Level III [41364] 2)  Est. Patient 40-64 years [id number]   Not Administered:    Influenza Vaccine not given due to: declined

## 2010-04-20 NOTE — Letter (Signed)
Summary: [facility]   Imported By: Phillis Knack 04/15/2010 07:55:16  _____________________________________________________________________  External Attachment:    Type:   Image     Comment:   External Document

## 2010-06-14 LAB — CBC
HCT: 46.3 % (ref 39.0–52.0)
HCT: 50.8 % (ref 39.0–52.0)
HCT: 52 % (ref 39.0–52.0)
Hemoglobin: 17.8 g/dL — ABNORMAL HIGH (ref 13.0–17.0)
Hemoglobin: 18.3 g/dL — ABNORMAL HIGH (ref 13.0–17.0)
MCHC: 34.2 g/dL (ref 30.0–36.0)
MCHC: 34.6 g/dL (ref 30.0–36.0)
MCV: 93.7 fL (ref 78.0–100.0)
Platelets: 175 10*3/uL (ref 150–400)
Platelets: 182 10*3/uL (ref 150–400)
Platelets: 183 10*3/uL (ref 150–400)
RBC: 5.53 MIL/uL (ref 4.22–5.81)
RDW: 14.4 % (ref 11.5–15.5)
RDW: 14.4 % (ref 11.5–15.5)
WBC: 20 10*3/uL — ABNORMAL HIGH (ref 4.0–10.5)
WBC: 23.6 10*3/uL — ABNORMAL HIGH (ref 4.0–10.5)
WBC: 8 10*3/uL (ref 4.0–10.5)

## 2010-06-14 LAB — COMPREHENSIVE METABOLIC PANEL
ALT: 34 U/L (ref 0–53)
AST: 58 U/L — ABNORMAL HIGH (ref 0–37)
Albumin: 3.6 g/dL (ref 3.5–5.2)
Alkaline Phosphatase: 69 U/L (ref 39–117)
Calcium: 8.4 mg/dL (ref 8.4–10.5)
GFR calc Af Amer: >60 mL/min (ref >60)
Potassium: 4.2 mEq/L (ref 3.5–5.1)
Sodium: 140 mEq/L (ref 135–145)
Total Protein: 6.5 g/dL (ref 6.0–8.3)

## 2010-06-14 LAB — URINALYSIS, ROUTINE W REFLEX MICROSCOPIC
Glucose, UA: NEGATIVE mg/dL
Nitrite: NEGATIVE
pH: 7 (ref 5.0–8.0)

## 2010-06-14 LAB — DIFFERENTIAL
Eosinophils Relative: 3 % (ref 0–5)
Lymphocytes Relative: 38 % (ref 12–46)
Lymphs Abs: 3 10*3/uL (ref 0.7–4.0)
Monocytes Absolute: 0.8 10*3/uL (ref 0.1–1.0)
Monocytes Relative: 10 % (ref 3–12)
Neutro Abs: 3.9 10*3/uL (ref 1.7–7.7)

## 2010-06-14 LAB — MRSA PCR SCREENING: MRSA by PCR: NEGATIVE

## 2010-06-14 LAB — BLOOD GAS, ARTERIAL
Bicarbonate: 26.2 mEq/L — ABNORMAL HIGH (ref 20.0–24.0)
Expiratory PAP: 5
O2 Saturation: 97.8 %
Patient temperature: 98.6
TCO2: 27.7 mmol/L (ref 0–100)
pH, Arterial: 7.342 — ABNORMAL LOW (ref 7.350–7.450)
pO2, Arterial: 108 mmHg — ABNORMAL HIGH (ref 80.0–100.0)

## 2010-06-14 LAB — BASIC METABOLIC PANEL
BUN: 13 mg/dL (ref 6–23)
CO2: 25 mEq/L (ref 19–32)
Chloride: 99 mEq/L (ref 96–112)
Creatinine, Ser: 1.09 mg/dL (ref 0.4–1.5)
GFR calc Af Amer: >60 mL/min (ref >60)
GFR calc non Af Amer: >60 mL/min (ref >60)
Glucose, Bld: 147 mg/dL — ABNORMAL HIGH (ref 70–99)
Glucose, Bld: 93 mg/dL (ref 70–99)
Potassium: 4.2 mEq/L (ref 3.5–5.1)
Potassium: 4.3 mEq/L (ref 3.5–5.1)
Sodium: 139 mEq/L (ref 135–145)

## 2010-06-14 LAB — APTT: aPTT: 34 seconds (ref 24–37)

## 2010-06-14 LAB — CARDIAC PANEL(CRET KIN+CKTOT+MB+TROPI)
CK, MB: 14.7 ng/mL — ABNORMAL HIGH (ref 0.3–4.0)
Relative Index: 1.1 (ref 0.0–2.5)
Total CK: 1354 U/L — ABNORMAL HIGH (ref 7–232)

## 2010-06-14 LAB — BRAIN NATRIURETIC PEPTIDE: Pro B Natriuretic peptide (BNP): <30 pg/mL (ref 0.0–100.0)

## 2010-06-22 ENCOUNTER — Other Ambulatory Visit: Payer: Self-pay | Admitting: Endocrinology

## 2010-06-22 MED ORDER — LEVOTHYROXINE SODIUM 175 MCG PO TABS: 175.0000 ug | ORAL_TABLET | Freq: Every day | ORAL | Status: DC

## 2010-06-22 NOTE — Telephone Encounter (Signed)
R'cd fax from [facility] for refill of pt's Levothyroxine.  Last OV-02/22/2010  Last filled-02/22/2010 (sent to [facility])

## 2010-07-26 NOTE — Assessment & Plan Note (Signed)
[place] OFFICE NOTE   ADONI, GREENOUGH                      MRN:          [id number]  DATE:07/24/2006                            DOB:          09-13-1950    Mr. Lindahl has had substantial improvement in his rectal bleeding. He  still notes intermittent small amounts of bleeding and he continues to  use Canasa suppositories. He notes that raw vegetables and bread tend to  lead to small-volume hematochezia without other bowel habit changes. He  is status post a recent left rotator cuff repair and he remains in  physical therapy. He is able to tolerate lying on his left side and left  shoulder without any difficulty. He is restricted in full extension of  his shoulder.   CURRENT MEDICATIONS:  Listed on the chart updated and reviewed.   MEDICATION ALLERGIES:  SHELLFISH.   PHYSICAL EXAMINATION:  GENERAL:  No acute distress.  VITAL SIGNS:  Weight 194 pounds, blood pressure is 118/82, pulse is 88  and regular. He is not reexamined, see prior exam from May 28, 2006.   ASSESSMENT/PLAN:  Persistent small-volume hematochezia. Rule out  hemorrhoids, proctitis, and colorectal neoplasms. The risks, benefits,  and alternatives to colonoscopy with possible biopsy, possible  polypectomy and possible destruction of internal hemorrhoids discussed  with the patient and he consents to proceed. This will be scheduled  electively. He will remain on Canasa suppositories for now.     Pricilla Riffle. Fuller Plan, MD, [facility]  Electronically Signed    MTS/MedQ  DD: 07/24/2006  DT: 07/24/2006  Job #: [id number]   cc:   Hilliard Clark A. Loanne Drilling, MD

## 2010-07-29 NOTE — Op Note (Signed)
[place]  Patient:    Philip Woodard, Philip Woodard                      MRN: [id number] Proc. Date: 02/29/00 Adm. Date:  [id number] Attending:  Delsa Bern CC:         Gloris Ham, M.D. [facility]   Operative Report  PREOPERATIVE DIAGNOSIS: 1. Submental mass. 2. Right buccal space mass.  POSTOPERATIVE DIAGNOSIS: 1. Submental mass. 2. Right buccal space mass.  OPERATION PERFORMED: 1. Excisional biopsy of submental mass. 2. Transoral excision of right buccal space mass.  SURGEON:  Early Chars. Wilburn Cornelia, M.D.  ANESTHESIA:  General endotracheal.  ASSISTANT:  Dr. Jennette Kettle.  ESTIMATED BLOOD LOSS:  Minimal.  COMPLICATIONS:  None.   The patient was transferred from the operating room to the recovery room in stable condition.  INDICATIONS FOR PROCEDURE:  [name] is a 60 year old black male who has been followed with a history of obstructive sleep apnea.  He was referred by his primary care physician, Gloris Ham, M.D. with a six-week history of swelling in the submental space with a corresponding mass in the right perimandibular/anterior buccal space.  The patient was treated with a course of antibiotics and a CT scan was obtained.  This showed heterogeneous density in the deep submental space consistent with possible lymphadenopathy.  The patient also had a mass in the right anterior buccal/perimandibular space consistent with a cyst or necrotic lymph node.  Incidental finding included a large multinodular goiter with large substernal extent and some minimal tracheal deviation.  Given the patients history and examination and failure to respond to antibiotic therapy, I recommended undertaking excisional biopsy of the submental and anterior buccal space masses.  Risks, benefits and possible complications were discussed in detail.  The patient understood and concurred with our plan for surgery which was scheduled as above.  DESCRIPTION  OF PROCEDURE:  The patient was brought to the operating room on February 29, 2000 and placed in supine position on the operating table. General endotracheal anesthesia was established without difficulty.  When the patient was adequately anesthetized, he was injected with a total of 3 cc of 1% lidocaine with 1:100,000 dilution of epinephrine.  2 cc were injected in the submental subcutaneous space and 1 cc was injected transorally into the buccal mucosa.  After allowing adequate time for vasoconstriction and anesthesia, the patient was prepped and draped and the surgical procedure was begun.  Creating an approximately 3 cm horizontal skin incision in the submental area in a pre-existing skin crease, the skin was incised and the deep subcutaneous tissues was divided.  Platysma muscle was identified and divided.  This allowed access to the deep submental space.  A large approximately 2 x 3 cm cluster of fibrous lymph nodes was identified.  This was gently dissected from the surrounding fascial tissue and resected in its entirety.  Sent to pathology for frozen section examination which revealed lymphoid hyperplasia or possibly low-grade lymphoma.  No evidence of metastatic carcinoma.  The incision was thoroughly irrigated with antibiotic saline solution and closed in layers with a 4-0 Vicryl suture with reapproximation of the platysma muscle and deep subcutaneous layers.  Final skin closure was achieved with a 5-0 Ethilon suture in a running locked fashion.  The patients wound was then dressed with bacitracin ointment. Attention was turned to the anterior buccal space mass and a 2 cm horizontal transoral incision was created in the lateral aspect  of the right cheek.  This was carried through the mucosa and a large approximately 2 x 2 cm cystic mass was identified.  This was gently dissected from the surrounding tissues.  The cyst was ruptured and thick mucoid material was suctioned.  There was  no evidence of infection or significant inflammation.  The cyst was then dissected from the surrounding buccal musculature and buccal fat and the entire cyst was removed.  The wound was then thoroughly irrigated with antibiotic saline solution and closed with a 3-0 Vicryl suture, tapered needle with several interrupted sutures.  The patients oral cavity and oropharynx was then thoroughly irrigated and suctioned.  He was awakened from his anesthetic, extubated and transferred from the operating room to the recovery room in stable condition.  There were no complications.  Estimated blood loss for this procedure was minimal. DD:  02/29/00 TD:  02/29/00 Job: [id number]

## 2010-07-29 NOTE — Assessment & Plan Note (Signed)
[place]                         GASTROENTEROLOGY OFFICE NOTE   Philip Woodard, Philip Woodard                      MRN:          [id number]  DATE:05/28/2006                            DOB:          1950-09-15    REFERRING PHYSICIAN:  Sean A. Loanne Drilling, MD   REASON FOR REFERRAL:  Hematochezia.   HISTORY OF PRESENT ILLNESS:  Philip Woodard is a very nice 60 year old  African American male that I have seen in the past.  He underwent  colonoscopy in October 2004 for hematochezia.  Medium-sized internal  hemorrhoids were noted and they were injected with hypertonic saline.  He had a very good response and he has not noted any significant  problems until several months ago, when he noted problems with recurrent  small amounts of rectal bleeding with bowel movements.  The bleeding  increased over the past several days and he was evaluated by Dr. Loanne Drilling  on February 28 and prescribed Anusol-HC cream and stool softeners.  Rectal examination at that time included external hemorrhoids and no  other abnormalities.  Despite this treatment, his symptoms have  persisted and actually worsened over the past few days and he is now  referred to me for further evaluation.  He has left rotator cuff surgery  scheduled for tomorrow with Dr. Veverly Fells.  He notes no change in stool  caliber, abdominal pain or weight loss.  No family history of colon  cancer, colon polyps or inflammatory bowel disease.   PAST MEDICAL HISTORY:  1. Asthma.  2. Status post thyroidectomy.  3. Sleep apnea.  4. Internal and external hemorrhoids.  5. Sarcoidosis.  6. Recurrent follicular hyperplasia.   CURRENT MEDICATIONS:  Listed on the chart, updated and reviewed.   MEDICATION ALLERGIES:  None known.   SOCIAL HISTORY AND REVIEW OF SYSTEMS:  Per the handwritten form.   PHYSICAL EXAMINATION:  Overweight, in no acute distress.  Height 5 feet  8 inches.  Weight 202 pounds.  Blood pressure is 138/80,  pulse 88 and regular.  HEENT:  Anicteric sclerae.  EOMI.  PERRLA.  Conjunctivae are pink.  Oropharynx clear.  NECK:  Well-healed incision consistent with his history of a  thyroidectomy.  No adenopathy appreciated.  Supple without carotid  bruits.  CHEST:  Clear to auscultation bilaterally.  CARDIAC:  Regular rate and rhythm without murmurs; normal S1 and S2.  There are gallops or rubs.  ABDOMEN:  Soft, nontender and non-distended.  Normoactive bowel sounds.  No palpable organomegaly, masses or hernias.  DIGITAL RECTAL:  Examination revealed external hemorrhoids and 1+  Hemoccult-positive brown stool in the vault.  EXTREMITIES:  Without clubbing, cyanosis, or edema.  NEUROLOGIC:  Alert and oriented x3.  Grossly nonfocal.   ASSESSMENT AND PLAN:  1. Hematochezia. Hemoccult-positive stool. Internal and external      hemorrhoids. The bleeding appears to be hemorrhoidal; however,      proctitis, inflammatory bowel disease and colorectal neoplasms need      to be further excluded with colonoscopy.  He will need to recover      from his left rotator cuff repair before we  can proceed with      colonoscopy.  Begin Canasa 1000 mg suppositories per rectum daily      for 2 weeks and then p.r.n., begin Analpram 2.5% HC cream b.i.d.      for 2 weeks and then p.r.n.  He is to maintain standard rectal care      and hemorrhoidal care instructions and written instructions were      supplied to the patient.  Return office visit with me in 6 weeks to      discuss the timing of colonoscopy.     Pricilla Riffle. Fuller Plan, MD, [facility]  Electronically Signed    MTS/MedQ  DD: 05/28/2006  DT: 05/29/2006  Job #: [id number]   cc:   Hilliard Clark A. Loanne Drilling, MD

## 2010-07-29 NOTE — Discharge Summary (Signed)
[place]. [facility]  Patient:    Philip Woodard, Philip Woodard                      MRN: [id number] Adm. Date:  [id number] Disc. Date: [id number] Attending:  Delsa Bern                           Discharge Summary  ADMISSION DIAGNOSES: 1. Multinodular goiter with substernal extension and tracheal compression. 2. Diffuse pulmonary nodularity.  DISCHARGE DIAGNOSES: 1. Multinodular goiter with substernal extension and tracheal compression. 2. Diffuse pulmonary nodularity.  PROCEDURES:  Subtotal thyroidectomy with substernal dissection performed on March 28, 2000.  DISPOSITION:  Discharged to home, in the company of his family.  CONDITION ON DISCHARGE:  Stable.  DISCHARGE MEDICATIONS: 1. Admission medications, which are:    a. Allegra 60 mg p.o. b.i.d. p.r.n.    b. Azmacort metered dose inhaler 3 puffs twice daily.    c. Nasonex nasal spray 2 puffs per nostril q.h.s.    d. Vioxx 50 mg p.o. q.d. 2. Additional discharge medications include:    a. Augmentin 500 mg p.o. b.i.d. for 10 days.    b. Percocet 5/325 1-2 tablets every four to six hours as needed for pain.  ACTIVITY:  Limited, no lifting or straining.  DIET:  No restrictions.  WOUND CARE:  Half-strength hydrogen peroxide and bacitracin ointment applied to the incision twice daily.  FOLLOW-UP:  The patient is scheduled to follow up in my office on April 09, 2000, for further postoperative care, or sooner as needed.  HISTORY OF PRESENT ILLNESS:  Philip Woodard is a 60 year old black male followed with a history of a large multinodular goiter.  The patient was referred by his primary care physician for evaluation of a submental mass, and workup included a CT scan of the neck.  This revealed a large substernal component to the patients multinodular goiter with compression of the trachea and significant narrowing as well as substernal expansion of the goiter.  In addition, the patient was found to have  diffuse nodularity within the lungs bilaterally, and this was thought to be consistent with possible metastatic disease.  Given significant thyroid enlargement, concerns were raised regarding possible thyroid carcinoma.  Given these findings and history, the patient was scheduled for a thyroidectomy under general anesthesia.  Prior to the operation, the risks, benefits, and possible complications of subtotal thyroidectomy were discussed in detail with Philip Woodard and his wife.  They understood and concurred with our plan for surgery, which was scheduled on March 28, 2000.  HOSPITAL COURSE:  The patient was admitted to the ENT service under Dr. Danie Binder care on March 28, 2000, at [facility].  He was taken to the operating room and under general anesthesia a subtotal thyroidectomy with substernal dissection was performed.  The patients surgical procedure was uneventful.  Frozen section pathology from the resected specimen revealed findings that were benign and consistent with multinodular goiter.  The surgical procedure consisted of subtotal thyroidectomy with removal of left thyroid lobe and large goiterous mass extending to the substernal/upper mediastinal space.  A portion of the right thyroid lobe and isthmus were also removed, and there was significant nodularity in this region.  The patient was extubated and then transferred from the operating room to recovery and from recovery to Unit 5700 for postoperative care.  The patients postoperative course was uneventful.  He had a moderate amount of  drain output through the Jackson-Pratt drain.  This was followed on an every-shift basis and, over the two days after surgery, the output gradually diminished to 15 cc per shift.  The Jackson-Pratt drain was removed on March 30, 2000.  The incision was dry and intact.  The patient was doing well.  He was tolerating a normal oral diet without difficulty.  He was ambulating,  had normal bowel and bladder function and no significant complaints.  Laboratory values were followed closely.  The patients calcium was normal throughout the postoperative period.  He had a gradually rising white blood cell count which may have been related to intravenous steroids given at the time of surgery. There was no evidence of infection.  The wound was dry and intact, and the patient was afebrile.  Philip Woodard was discharged to home in stable condition, with the above discharge instructions and medications.  He is comfortable with our discharge plan and will return to my office on April 09, 2000, for further postoperative evaluation, or sooner as warranted.  Prior to discharge, pathology of the resected specimen was reviewed with Philip Woodard.  Pathologist had contacted me regarding his final pathologic diagnosis, which was benign multinodular goiter without evidence of thyroid carcinoma.DD:  03/30/00 TD:  04/01/00 Job: [id number]

## 2010-07-29 NOTE — Op Note (Signed)
NAME:  Philip Woodard, Philip Woodard NO.:  [id number]   MEDICAL RECORD NO.:  [id number]          PATIENT TYPE:  INP   LOCATION:  3308                         FACILITY:  [place]   PHYSICIAN:  Early Chars. Wilburn Cornelia, M.D.DATE OF BIRTH:  [birth year]   DATE OF PROCEDURE:  09/09/2004  DATE OF DISCHARGE:                                 OPERATIVE REPORT   PREOPERATIVE DIAGNOSES:  1.  Post surgical hemorrhage/hematoma  2.  Status post right hemithyroidectomy with substernal dissection.  3.  Obstructive sleep apnea.   POSTOPERATIVE DIAGNOSES:  1.  Post surgical hemorrhage/hematoma  2.  Status post right hemithyroidectomy with substernal dissection.  3.  Obstructive sleep apnea.   SURGICAL PROCEDURES:  Examination and evacuation of hemorrhage and ligation  of bleeding site after right hemithyroidectomy.   ANESTHESIA:  General endotracheal.   SURGEON:  Early Chars. Wilburn Cornelia, M.D.   COMPLICATIONS:  None.   ESTIMATED BLOOD LOSS:  Approximately 100 mL.   DISPOSITION:  The patient was transferred from the operating room to the  recovery room in stable condition.   BRIEF HISTORY:  [name] is a 60 year old white male with a history of a  large right substernal goiter, who underwent transcervical excision on September 09, 2004. The patient was transferred from the operating room to recovery  and had postobstructive sleep apnea with a known history of sleep apnea and  was transferred to unit 3300 for postoperative observation and care. On the  floor approximately six hours after his surgery, the patient developed  swelling in the right neck with pain and pressure symptoms. No airway  distress or stridor. Examination at the bedside revealed a subcutaneous  hematoma in the right neck. No evidence of tracheal deviation or airway  compromise. Given the patient's history and examination, I recommended that  we take him to the operating room and explore the incision and ligate  bleeding source. The  risks, benefits and possible complications of the  procedure were discussed with the patient and his wife, who understood and  concurred with our plan for surgery which is scheduled on an emergent basis  at the [facility] [place]:  The patient was brought to the operating room on  September 09, 2004. General endotracheal anesthesia was established without  difficulty. When the patient had been adequately anesthetized, his sutures  were cut and the surgical incision was explored. There were several areas of  active bleeding, particularly along the right lateral neck superficial to  the platysma muscle and external to the  thyroidectomy site. The patient was  found to have a large vessel which appeared to be a branch from the external  jugular vein. This was suture ligated. Several other areas of point  hemorrhage were also ligated and cauterized. The patient's wound was then  thoroughly irrigated and suctioned and was closed in layers. A previously  placed drain was left in place. The platysma muscle was reapproximated with  a 3-0 Vicryl suture. The subcutaneous tissue was approximated with a 4-0  Vicryl suture. The final skin edges were  approximated with a 5-0 Ethilon  suture. The patient then awakened from his anesthetic, extubated and was  transferred from the operating room to the recovery room in stable  condition. No complications. The blood loss was approximately 100 mL.       DLS/MEDQ  D:  62/94/7654  T:  09/09/2004  Job:  [id number]

## 2010-07-29 NOTE — Op Note (Signed)
NAME:  Philip Woodard, Philip Woodard NO.:  [id number]   MEDICAL RECORD NO.:  [id number]          PATIENT TYPE:  OIB   LOCATION:  2550                         FACILITY:  [facility]   PHYSICIAN:  Doran Heater. Veverly Fells, M.D. DATE OF BIRTH:  1950-08-12   DATE OF PROCEDURE:  05/29/2006  DATE OF DISCHARGE:                               OPERATIVE REPORT   PREOPERATIVE DIAGNOSIS:  Left shoulder rotator cuff tear.   POSTOPERATIVE DIAGNOSES:  1. Left shoulder rotator cuff tear.  2. Left shoulder frozen shoulder.   PROCEDURE PERFORMED:  Left shoulder arthroscopy with extensive  intraarticular debridement including arthroscopic capsule release,  arthroscopic subacromial decompression with coracoacromial ligament  release followed by mini open rotator cuff repair.   ATTENDING SURGEON:  Doran Heater. Veverly Fells, M.D.   ASSISTANT:  Abbott Pao. Dixon, P.A.-C   ANESTHESIA:  General.  Left interscalene block.   ESTIMATED BLOOD LOSS:  Minimal.   FLUID REPLACEMENT:  1200 mL crystalloid.   INSTRUMENT COUNT:  Correct.   COMPLICATIONS:  No complications.   PERIOPERATIVE ANTIBIOTICS:  Given.   INDICATIONS:  The patient is a 60 year old male with worsening left  shoulder pain noted secondary to rotator cuff tear.  The patient had an  MRI documenting full thickness rotator cuff tear and has failed  conservative management, consistent of activity modifications, anti-  inflammatories, injections, and therapy, presents now for operative  treatment to relieve pain and regain function.  Informed consent  obtained.   DESCRIPTION OF PROCEDURE:  After an adequate level of anesthesia was  achieved, the patient was positioned in the modified beach-chair  position. The left shoulder  was examined under anesthesia.  Had forward elevation limited to 90  degrees.  Manipulated up to 160.  Abduction was about 70 degrees.  We  improved this to about 100 degrees.  External rotation started about 45  and manipulated out to  about 70.  Internal rotation was just to the mid  axillary line.  We were able to get that behind his back and  approximately 45 degrees of the arm abducted.  We did have a nice pop at  posterior cross arm adduction and internal rotation indicating posterior  capsular disruption.  At this point, we concluded the exam under  anesthesia and manipulation under anesthesia.  We went ahead and  proceeded with our shoulder arthroscopy through standard portals.  Anterior, posterior, and lateral portals were all created.  We entered  the shoulder and noted there to be significant capsular inflammation,  consistent with adhesive capsulitis.  There was a thickened rotator  interval.  We performed a debridement of all inflamed capsule using the  ArthroCare unit, followed by capsule release, starting with the rotator  interval release first.  Subscapularis was normal.  Rotator cuff was  torn full thickness with retraction.  The superior labrum and biceps  anchor were intact.  Biceps was normal.  Posterior cuff looked to be  good in the region of the teres minor.  Did perform a capsule release  inferiorly, as well, down in the anterior inferior glenohumeral ligament  area such that the  subscap was fully freed up and skeletonized.  We did  not release the inferior portion of the capsule over a fear of injuring  the axillary nerve.  We did do a complete posterior capsule release, as  visualized from the anterior portal.  Following the capsule release, we  went ahead and placed the scope in the subacromial space, performed a  thorough bursectomy and acromioplasty, creating a type-1 acromial shape  with a third decompression.  The patient had an extremely large and  impinging osteophyte off the anterior edge of the acromion.  This was  completely removed.  We visualized from both lateral portals and  posterior portals, as well as the bursa was removed and we noted there  to be full thickness rotator cuff  tear.  At this point, we concluded the  arthroscopy, went ahead and made our small mini open incision at the  anterolateral border of the acromion.  We dissected down to the deltoid  and split it in line with the raphe to the anterolateral head to  identify the rotator cuff tear.  Significant bursa still remaining, we  removed all of that bursa and then went ahead and noted this to be an L-  type tear.  We freed up both the bursal surface and the joint surface of  the tear, releasing over the top of the glenoid.  We placed two #2  FiberWire sutures in a modified Mason-Allen suture technique into the  free edge of the tendon and got it to where it balanced nicely and  returned to its normal anatomic location.  We then prepared the rotator  cuff with rongeurs and curets up to the articular margin.  We then  placed two 5.5 [employer]-Corkscrew anchors, adjusting the articular  margin, and used those with matching suture technique to repair the  medial portion of the footprint, bringing the lateral modified [employer] sutures out through drill holes in the greater tuberosity and tied  out over the lateral humerus.  Once we had all our sutures tied, we had  basically an anatomic repair of the rotator cuff.  We reinforced the  posterior-most aspect of the tear with some 0 Vicryl suture, as well as  the anterior-most into the rotator interval area with 0 Vicryl suture,  tucking the subscapularis down.  The biceps appeared to be in normal  position.  We checked to make sure we did not incarcerate that and the  biceps screw.  We had nice low-profile repair.  We took the shoulder  through a full range of motion, thoroughly irrigating, and then closing  the deltoid to itself with interrupted 0 Vicryl suture, followed by 2-0  Vicryl subcutaneous, and 4-0 Monocryl to the skin.  Steri-Strips  applied, followed by sterile dressing.  The patient tolerated surgery  well.      Doran Heater. Veverly Fells,  M.D.  Electronically Signed     SRN/MEDQ  D:  05/29/2006  T:  05/29/2006  Job:  [id number]

## 2010-07-29 NOTE — Discharge Summary (Signed)
NAME:  Philip Woodard, Philip Woodard NO.:  [id number]   MEDICAL RECORD NO.:  [id number]          PATIENT TYPE:  OIB   LOCATION:  5012                         FACILITY:  [facility]   PHYSICIAN:  Doran Heater. Veverly Fells, M.D. DATE OF BIRTH:  09-16-1950   DATE OF ADMISSION:  DATE OF DISCHARGE:  05/30/2006                               DISCHARGE SUMMARY   ADMISSION DIAGNOSIS:  Left shoulder pain secondary to rotator cuff tear.   DISCHARGE DIAGNOSIS:  Left shoulder rotator cuff tear and also a left  frozen shoulder.   PROCEDURE:  The patient had a left shoulder arthroscopy with rotator  cuff repair performed by Dr. Esmond Plants on May 29, 2006.  The  assistant was Shelle Iron, PA-C.  General anesthesia with inner scalene  block.  Estimated blood loss was minimal with no complications.   HOSPITAL COURSE:  The patient was admitted on May 29, 2006 for the  above-stated procedure which the patient tolerated well, and after  adequate time in the post anesthesia care unit, he was transferred up to  5000.  Postoperative day 1, the patient complained about some moderate  pain in that left shoulder, but it was tolerable and under good control  from his current pain medications.  The patient did rest in a recliner  the entire night.  Denied any nausea.  Was afebrile throughout his  entire stay.  Neurovascularly, he was intact on exam of that left upper  extremity and was left in a shoulder abduction pillow sling.  Capillary  refill was less than 2 seconds.   DISCHARGE PLANNING:  The patient will be discharged home on May 30, 2006.  His condition is stable.  His diet is regular.  Activities:  No  use of the left upper extremity for weightbearing.  The patient has  discharge medications of Percocet 5/325 mg 2 tablets every 4-6 hours  p.r.n. for pain and also Robaxin 500 mg one tablet every 6 hours p.r.n.  for pain.  He also takes Zovirax 800 mg p.o. daily, Levothyroxine 150  mcg p.o. daily,  and he has no known drug allergies.      Thomas B. Doren Custard, P.A.      Doran Heater. Veverly Fells, M.D.  Electronically Signed    TBD/MEDQ  D:  05/30/2006  T:  05/30/2006  Job:  [id number]

## 2010-07-29 NOTE — Op Note (Signed)
[facility]  Patient:    Philip Woodard, Philip Woodard                      MRN: [id number] Proc. Date: 03/28/00 Adm. Date:  [id number] Attending:  Delsa Bern                           Operative Report  PREOPERATIVE DIAGNOSES: 1. Multinodular goiter with substernal extension and tracheal compression. 2. Pulmonary nodularity.  POSTOPERATIVE DIAGNOSES: 1. Multinodular goiter with substernal extension and tracheal compression. 2. Pulmonary nodularity.  PROCEDURE:  Subtotal thyroidectomy with substernal dissection.  ANESTHESIA:  General endotracheal.  SURGEON:  Early Chars. Wilburn Cornelia, M.D.  ASSISTANT:  Juanda Bond. Harle Battiest, M.D.  COMPLICATIONS:  None.  ESTIMATED BLOOD LOSS:  Approximately 150 cc.  DISPOSITION:  Patient transferred from the operating room to the recovery room in stable condition.  DRAINS:  A 10 mm Blake drain was placed at the conclusion of the procedure.  PATHOLOGY:  Frozen section pathology showed benign thyroid tissue consistent with multinodular goiter.  BRIEF HISTORY:  Mr. Philip Woodard is a 60 year old black male who is followed through my office with a history of obstructive sleep apnea.  The patient was seen approximately eight weeks prior to surgery with swelling in the submental space and on evaluation was found to have enlargement of his previously- identified multinodular goiter.  A CT scan of the neck was obtained, which showed a mass in the submental space and a significant enlargement of the patients thyroid gland with substernal extension and tracheal compression. He was also found to have pulmonary nodularity on preoperative chest x-ray. The patient underwent uneventful excisional biopsy of his submental mass, which was consistent with benign lymphadenopathy.  Given findings of enlarging multinodular goiter and tracheal compression, he was counseled regarding thyroidectomy.  Findings on chest x-ray and chest CT scan were  consistent with diffuse pulmonary nodularity, and concerns were raised regarding possible metastatic disease.  Workup included CT scan of the abdomen and pelvis, which was negative.  Based on these findings, we continued to be concerned about possible metastatic thyroid carcinoma.  Given his findings and history, I have recommended that he undertake subtotal thyroidectomy with excision of the large multinodular goiter substernal portion and biopsy for possible malignancy.  The risks, benefits, and possible complications of a subtotal thyroidectomy were discussed in detail with [name] and his family.  These include but are certainly not limited to bleeding, infection, pain, scarring, hypocalcemia secondary to parathyroid injury, and possible recurrent laryngeal nerve injury with chronic hoarseness.  [name] understood and concurred with our plan for surgery as outlined above, scheduled at [facility] main OR.  DESCRIPTION OF PROCEDURE:  The patient was brought to the operating room on March 28, 2000, and placed in a supine position on the operating table. General endotracheal anesthesia was established without difficulty.  When the patient was adequately anesthetized, he was injected with 6 cc of 1% lidocaine and 1:100,000 solution of epinephrine injected in a subcutaneous fashion in a horizontal line across the anterior inferior neck.  The patient was then prepped and draped in a sterile fashion.  The endotracheal tube placed was a NIMS (nerve integrity monitor system) endotracheal tube for monitoring of the recurrent laryngeal nerve and vocal function.  The nerve monitor was tested prior to surgery, and there was good nerve conduction.  The patient was then prepped and draped in a sterile  fashion, and the surgical procedure was begun by creating a 7 cm horizontal incision along the anterior lower aspect of the neck.  This was carried through the skin and underlying subcutaneous  tissue. The platysma muscles were identified laterally and divided, and a subplatysmal flap was elevated superiorly and inferiorly from the level of the incision to the laryngeal cartilage and inferiorly to the suprasternal notch and clavicles.  The midline strap muscles were identified and divided in the midline.  They were then lateralized, allowing direct access to the anterior compartment of the neck.  The sternocleidomastoid muscles were also identified and dissected free of the surrounding tissue along the anterior borders with lateral retraction.  A self-retaining retractor was placed, and the procedure was begun by gently elevating the tissue surrounding the thyroid gland.  The patient had a large mass involving the inferior aspect of the left thyroid gland and thyroid isthmus.  The mass measured approximately 3 x 4 cm.  With gentle dissection, I was able to elevate the lateral and inferior aspects of the left thyroid gland, gently dissecting along the posterior aspect of the sternum and elevating the substernal component of the tumor.  Dissection was then carried in an extracapsular fashion along the lateral aspect of the left thyroid lobe.  The middle thyroid vein was identified, divided, and ligated. This mobilized the gland medially.  Attention was then turned to the superior component of the gland, and gentle dissection in an extracapsular fashion was performed.  The superior thyroid artery and vein were identified, divided, and suture ligated individually, and the superior aspect of the left thyroid lobe was reflected inferiorly and medially.  Attention was then turned to the inferior component of the gland, and again gentle blunt and sharp dissection were carried out in order to mobilize the gland superiorly from its substernal component.  With the gland mobilized into the neck, substernal dissection was completed and there was no significant bleeding or injury to the  substernal or intrathoracic structures.  The inferior thyroid pedicle was then identified along the entrance to the gland.  Artery and vein were divided and suture  ligated.  Deep dissection in the tracheoesophageal groove identified the main trunk of the recurrent laryngeal nerve, and this was traced from proximal to distal, heading superiorly along the lateral aspect of the left side of the trachea, dissecting the overlying thyroid gland and elevating it medially. The anterior thyroid ligament was identified and again dissected off the anterior tracheal wall, preserving the recurrent laryngeal nerve throughout. A left superior parathyroid gland was identified and preserved.  Inferior gland on the left-hand side was not identified.  The left thyroid lobe was then removed at the thyroid isthmus and sent to pathology for initial frozen section evaluation.  Frozen section showed benign thyroid mass with significant multinodular component and cystic structures.  Attention was then turned to the patients right-hand side, where a firm mass in the medial aspect of the right thyroid lobe was identified.  The gland was gently dissected off the right side of the trachea.  Superior midline veins were identified and divided, and using a large curved clamp, the medial aspect of the gland was clamped.  This was removed using the 15 scalpel and sent to pathology for separate frozen section evaluation, which was negative for carcinoma.  The free edge was then oversewn with a 2-0 silk suture in a running locked fashion.  The remainder of the thyroid gland, including the superior, posterior, and inferior aspects,  were left intact.  The recurrent laryngeal nerve was not identified on the right-hand side.  The wound was then thoroughly irrigated and hemostasis was maintained with bipolar cautery. Small piece of Surgicel gauze measuring approximately 1 x 3 cm was laid over the left recurrent laryngeal nerve,  and a 10 mm Blake suction was placed through a separate stab incision in the depths of the left neck.  The wound was then closed in multiple layers, initially reapproximating the strap muscles in the midline with a 3-0 Vicryl suture, platysma muscle was reapproximated with a 4-0 Vicryl suture, as were midline structures, and the final subcutaneous closure was achieved with 4-0 Vicryl.  Skin closure was completed with 5-0 Ethilon suture in a running locked fashion.  The patients wound was dressed with bacitracin ointment.  He was awakened from his anesthetic, extubated without difficulty, and transferred from the operating room to the recovery room in stable condition.  There were no complications. Estimated blood loss was approximately 150 cc. DD:  03/28/00 TD:  03/29/00 Job: [id number]

## 2010-07-29 NOTE — Op Note (Signed)
[facility]  Patient:    Philip Woodard, Philip Woodard                      MRN: [id number] Proc. Date: 05/03/00 Adm. Date:  [id number] Attending:  Arturo Morton CC:         Gloris Ham, M.D. [facility]   Operative Report  REFERRING PHYSICIAN:  Gloris Ham, M.D.  PROCEDURE:  Fiberoptic bronchoscopy with transbronchial biopsy of the right upper lobe.  SURGEON:  Christena Deem. Melvyn Novas, M.D.  HISTORY AND INDICATIONS:  This is a very nice 60 year old black male with a biliary pattern on chest x-ray and CT scan which were done during the workup for a thyroid _________ which turned out to be benign.  He also had associated nonspecific lymphadenitis.  The discrepancy between the pulmonary infiltrates, which are extensive, and the clinical syndrome, which is basically asymptomatic, strongly suggest sarcoid and a transbronchial biopsy is being performed at this point to obtain tissue for confirmation.  The patient agreed to the procedure after a full discussion of risks, benefits, and alternatives.  For more information on this patient, please see comprehensive pulmonary consultation note dictated in the office within the past two weeks.  Since that evaluation, there has been no change on exam or history as the patient remains totally asymptomatic.  DESCRIPTION OF PROCEDURE:  The procedure was performed in the bronchoscopy suite with continuously monitoring by surface ECG and oximetry.  He maintained greater than 90% saturation throughout the procedure in sinus rhythm.  He received a total of 25 mg of IV Demerol and 2.5 mg of IV Versed for adequate sedation and cough suppression.  The right nares and oropharynx were liberally anesthetized with 10% lidocaine spray.  Both nares were additionally prepared with 2% lidocaine jelly.  I could not pass the bronchoscope easily through the right nares, but I could pass it through the left nares on a second attempt.  The  upper airway was poorly visualized until we did a _______, but appeared normal with no definite endobronchial lesions and vocal cords appeared to move normally to phonation.  Using additional 1% lidocaine as needed, the entire tracheal bronchial tree was explored bilaterally with the following findings:  1. There was striking diffuse macrocobblestoning of the airway.  It appeared    to perhaps a bit worse in the right upper lobe, but was generalized.  All    the airways however were opened widely with the subsegmental bilaterally    with no focal lesions.  No focal obstruction from these macronodular    changes consistent with large "cobblestones."  Therefore, using a transbronchial approach in the right upper lobe, multiple biopsies were obtained with adequate tissue.  There was minimal bleeding encountered and so the right middle lobe was also lavaged.  I approached the right upper lobe in a wedge position using fluoroscopic guidance with six total transbronchial biopsies obtained.  IMPRESSION:  Diffuse macronodular lung disease consistent with sarcoid in this patient who is asymptomatic.  Differential diagnosis does include other granulomatous diseases and also metastatic cancer, although he certainly does not appear to have an infection or malignant process at present clinically.  RECOMMENDATIONS:  Await tissue diagnosis from histology from the transbronchial biopsies and also I submitted the lavage of the right middle lobe for cytology, AFB, and fungal stain and culture, and Pneumocystis stain.  FINAL DIAGNOSIS:  Multiple lung nodules, undetermined etiology, strongly suspect sarcoid. DD:  05/03/00 TD:  05/04/00  Job: [id number]

## 2010-07-29 NOTE — Discharge Summary (Signed)
[facility]  Patient:    Philip Woodard, Philip Woodard                      MRN: [id number] Adm. Date:  [id number] Disc. Date: 05/07/00 Attending:  Arturo Morton Dictator:   Thomasene Lot, RN, MSN, ACNP CC:         Early Chars. Wilburn Cornelia, M.D.             Gloris Ham, M.D. [facility]                           Discharge Summary  DISCHARGE DIAGNOSES: 1. Right pneumothorax, status post transbronchial biopsy. 2. Sarcoid. 3. Nasal stuffiness. 4. Asthma. 5. Obstructive sleep apnea. 7. Goiter, status post surgery.  HISTORY OF PRESENT ILLNESS:  Philip Woodard is a 60 year old, African-American male who admits to have never smoked.  He has been under evaluation by Dr. Renato Shin and Dr. Jerrell Belfast for submental mass and goiter, found to have lymphoid hyperplasia with no specific diagnosis other than nonspecific goiter.  He was incidentally discovered to have miliary changes by CT scan and workup for goiter March 07, 2000.  He agreed to have transbronchial biopsy on May 04, 2000.  Following the procedure, he had a tiny pneumothorax on the right and approximately two hours later after being on observation, he was noted to have a 50% pneumothorax which required a chest tube insertion per Dr. Melvyn Novas.  LABORATORY DATA AND X-RAY FINDINGS:  ESR 4.  Angiotensin converting enzyme is pending.  Pneumocystis carinii is negative.  AFBs are pending.  Fungus and culture smear are negative at this time.  No yeast is seen.  Initial chest x-ray revealed a tiny right pneumothorax.  Subsequent chest x-ray on February 22, revealed 50% pneumothorax.  Chest tube removal and followup chest x-ray on February 25, showed no pneumothorax.  Chest x-ray also shows some air space disease with lungs bilaterally.  Diffuse, interstitial patchy air space opacity in lungs were described.  HOSPITAL COURSE:  #1 - RIGHT PNEUMOTHORAX, STATUS POST TRANSBRONCHIAL BIOPSY: Chest tube was inserted on  May 04, 2000, by Dr. Melvyn Novas with resolution of a right 50% pneumothorax.  Subsequent chest x-ray revealed the lung to be improperly inflated.  Chest tube was removed on 05/06/00.  Twenty-four hours later, the chest x-ray revealed no pneumothorax.  Right pneumothorax has resolved.  Transbronchial biopsies consistent with sarcoid.  Currently, he is not on any steroid therapy.  #2 - SARCOID:  He will follow up with Dr. Melvyn Novas within two days to be evaluated for steroids versus monitoring of diagnosis of sarcoid.  Also noted prior to transbronchial biopsy, a fiberoptic bronchoscopy noted cobblestoning which is consistent with sarcoid.  #3 - NASAL STUFFINESS:  Nasal stuffiness is being addressed with Nasonex.  #4 - ASTHMA:  He will be continued on his Pulmicort three puffs b.i.d.  #5 - OBSTRUCTIVE SLEEP APNEA:  For obstructive sleep apnea, he will be continued on this CPAP machine as per Dr. Wilburn Cornelia.  #6 - GOITER:  He is status post goiter surgery.  This has been evaluated by Dr. Wilburn Cornelia.  ACTIVITY:  As tolerated.  DIET:  No restriction.  WOUND CARE:  Right chest tube insertion site is being addressed with antimicrobial therapy after three days of Rocephin.  He will be on four more days of Ceftin 250 mg b.i.d.  He has been instructed if he has any drainage that is purulent,  to contact Dr. Melvyn Novas.  FOLLOWUP:  Follow-up appointment with Dr. Melvyn Novas on May 10, 2000, at 10:50 a.m.  DISCHARGE MEDICATIONS: 1. Ceftin 250 mg one b.i.d. 2. Pulmicort three puffs b.i.d. 3. Vioxx 25 mg and/or Advil 800 mg t.i.d.  CONDITION ON DISCHARGE:  Right pneumothorax has completely resolved.  He has been diagnosed with sarcoid and this will be further evaluated by Dr. Melvyn Novas on an outpatient basis for the question of initiation of steroid therapy.  Of note, he is asymptomatic from his sarcoid at this point.  ACE level is pending.  His sedimentation rate was 4. DD:  05/07/00 TD:  05/07/00 Job:  [id number]

## 2010-07-29 NOTE — Op Note (Signed)
NAME:  Philip Woodard, Philip Woodard NO.:  [id number]   MEDICAL RECORD NO.:  [id number]          PATIENT TYPE:  OIB   LOCATION:  6381                         FACILITY:  [facility]   PHYSICIAN:  Early Chars. Wilburn Cornelia, M.D.DATE OF BIRTH:  05/04/1950   DATE OF PROCEDURE:  DATE OF DISCHARGE:                                 OPERATIVE REPORT   PRE-AND-POSTOPERATIVE DIAGNOSIS AND INDICATIONS FOR SURGERY:  1.  Submental deep neck mass.  2.  Status post thyroidectomy for a benign multinodular goiter.   SURGICAL PROCEDURES:  Excisional biopsy of the submental deep neck mass.   ANESTHESIA:  General endotracheal   SURGEON:  Shanon Brow L. Wilburn Cornelia, M.D.   COMPLICATIONS:  None.   BLOOD LOSS:  Minimal.   The patient transferred to the operating room to recovery room in stable  condition.   BRIEF HISTORY:  The patient is a 60 year old black male who has been  followed with a history of obstructive sleep apnea and multinodular goiter.  The patient underwent excision of substernal thyroid goiter and was also  noted to have an incidental submental neck mass. Previous biopsy in that  area showed benign lymphadenopathy.  After his biopsy several years ago, the  patient noted gradual enlargement of a similar mass which was otherwise  asymptomatic. No erythema or tenderness. The finding was noted on the  patient's preoperative CT scan for the thyroid surgery; and we opted to  proceed with an excisional biopsy, to rule out any additional problems.  The  risk, benefits and possible complications of the surgical procedure were  discussed in detail. Surgery performed under general anesthesia at [place].   DESCRIPTION OF PROCEDURE:  The patient brought to the operating room at  [facility] and placed in the supine position on the operating  table. General endotracheal anesthesia was established without difficulty;  and the patient was adequately anesthetized.  He was injected  with 2 mL of  1% lidocaine 1:100,000 dilution epinephrine injected in a subcutaneous  fashion in the submental region. The patient was then prepped and draped in  a sterile fashion and a 3-cm, horizontally oriented incision was created in  the submental space. This was carried through the skin into underlying deep  subcutaneous tissue.   The midline strap muscles were identified and divided in the midline. They  were then lateralized and a large approximately 2-cm soft tissue mass was  dissected free from the deep submental soft tissues superior to the hyoid  bone. Dissection was carried out with Bovie electrocautery and sharp  dissection.  The mass was resected in its entirety and sent to pathology for  gross and microscopic evaluation. The patient's wound was then thoroughly  irrigated with saline solution. There several areas of point hemorrhage  which were cauterized. The patient's wound was closed with deep subcutaneous  closure consisting of 5-0 Vicryl suture and final skin closure with 4-0  Vicryl  suture. Final skin closure with 5-0 Ethilon. The patient was dressed with  bacitracin ointment. The patient was awakened from his anesthetic,  extubated, and transferred from  the operating room to the recovery in stable  condition.           ______________________________  Early Chars Wilburn Cornelia, M.D.     DLS/MEDQ  D:  20/23/3435  T:  12/23/2004  Job:  [id number]

## 2010-07-29 NOTE — Consult Note (Signed)
[facility]                          ENDOCRINOLOGY CONSULTATION   CHIDIEBERE, WYNN                      MRN:          [id number]  DATE:03/26/2006                            DOB:          05/06/1950    REFERRING PHYSICIAN:  Doran Heater. Veverly Fells, M.D.   This is a preoperative consult.   REASON FOR REFERRAL:  Scheduled for left rotator cuff surgery early  2008.  He states he feels well in general.   Regarding his hypothyroidism, he is still on Synthroid 125 mcg a day.   Regarding his hyperglycemia, no recent change in his weight.   Regarding his sarcoidosis, he has been off steroids for more 2 years.   PAST MEDICAL HISTORY:  1. Allergic rhinitis.  2. HSV-2.  3. Mild asthma.  4. Sleep apnea.  5. Hemorrhoids.   MEDICATIONS:  1. Levothyroxine 125 mcg a day.  2. Mobic 15 mg a day.  3. Acyclovir 800 mg a day.   REVIEW OF SYSTEMS:  Denies the following:  Fever, chest pain, shortness  of breath, and cough.   PHYSICAL EXAMINATION:  VITAL SIGNS:  Blood pressure 136/81, heart rate  97, temperature is 97.8, the weight is 201.  GENERAL:  No distress.  SKIN:  Not diaphoretic, no rash.  HEENT:  No proptosis, no periorbital swelling.  Pharynx is normal  NECK:  Thyroid is minimally enlarged with an irregular surface.  CHEST:  Clear to auscultation.  No respiratory distress.  CARDIOVASCULAR:  No JVD, no edema.  Regular rate and rhythm, no murmur.  Pedal pulses are intact.  ABDOMEN:  Soft, nontender, no hepatosplenomegaly, no mass, no hernia.   Electrocardiogram is minimally abnormal with a question of a QS complex  in V2.   IMPRESSION:  1. Abnormal electrocardiogram in a preoperative patient.  2. Hyperglycemia.  3. Hypothyroidism.  4. History of sarcoidosis, not recently active.   PLAN:  1. Check exercise cardiac nuclear study.  2. Check TSH.  3. Check A1c.  4. Chest x-ray is done in the office and to my reading there is no       abnormality.  5. Return here p.r.n.  6. If the above studies are okay, he is medically cleared for surgery      (if his TSH is slightly high, I would increase his Synthroid, but      he does not have to have that normalized to go for surgery).     Sean A. Loanne Drilling, MD  Electronically Signed    SAE/MedQ  DD: 03/26/2006  DT: 03/26/2006  Job #: [id number]   cc:   Pollyann Savoy

## 2010-07-29 NOTE — H&P (Signed)
[facility]  Patient:    Philip Woodard, Philip Woodard                      MRN: [id number] Adm. Date:  [id number] Attending:  Arturo Morton                         History and Physical  CHIEF COMPLAINT:  Dyspnea.  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 Gloris Ham and Dr. Early Chars. Sho[place] for submental mass with goiter and found to have lymphoid hyperplasia with no specific diagnosis other than nonspecific goiter. He was incidentally discovered to miliary changes by CT scan on workup for the goiter on December 26th and he agreed to a transbronchial biopsy today after discussion of the risks, benefits and alternatives in the office.  The procedure was remarkable for marked cobblestoning and studding of the airways consistent with either severe sarcoid or even the possibility of malignant involvement.  A total of four biopsies were obtained from the right upper lobe randomly.  A post-bronchoscopy chest x-ray showed a tiny right pneumothorax and he was kept in observation on 6 L nasal prongs through the morning.  Three hours later, the pneumothorax had increased to 50% in size and therefore I admitted the patient to the telemetry floor for placement of a right chest tube.  At the time of admission, the patient is mildly short of breath at rest but denies any pleuritic pain, fevers, chills, sweats, nausea, orthopnea, PND or history of leg swelling or unintended weight loss.  PAST MEDICAL HISTORY:  Significant for thyroidectomy, March 28, 2000, and a buccal mass excision, February 29, 2000.  ALLERGIES:  None known.  SOCIAL HISTORY:  He has never smoked.  He works as a Landscape architect extensively for 25 years.  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  FAMILY HISTORY:  Negative for respiratory disease, TB or cancer.  REVIEW OF SYSTEMS:   Review of systems is taken in detail and essentially negative except as noted above.  PHYSICAL EXAMINATION  GENERAL:  This is a very pleasant, ambulatory black male who does not appear in any acute distress although he has minimal increased work of breathing.  VITAL SIGNS:  He is afebrile with normal vital signs.  HEENT:  Unremarkable.  Pharynx is clear.  Dentition is intact.  NECK:  Supple without cervical adenopathy or tenderness.  Trachea is midline. There is a scar at the base of the neck consistent with thyroidectomy scar and healing well.  CHEST:  Chest reveals hyperresonance to percussion and decreased breath sounds in the right versus the left.  Overall air movement is adequate.  CARDIAC:  There is a regular rate and rhythm without murmur, gallop or rub present.  There was no Hammans crunch present.  ABDOMEN:  Soft and benign.  EXTREMITIES:  Warm without calf tenderness, cyanosis, clubbing or edema.  NEUROLOGIC:  No focal deficits or pathologic reflexes.  SKIN:  Warm and dry.  X-RAY FINDINGS:  Chest x-ray reveals a large right pneumothorax with bilaterally severe diffuse reticular macular nodular infiltrates of undetermined etiology.  IMPRESSION:  Right pneumothorax following transbronchial biopsy.  Since these biopsies are tiny forceps biopsies, the amount of air usually is very small. I was reassured initially by the tiny amount of pneumothorax present but he states he was coughing after the procedure  and perhaps he generated a secondary worsening air leak related to coughing.  At this point we have no choice but to place a chest tube and I discussed the risks, benefits and alternatives to this in detail and he agreed to the procedure this afternoon, which will be done immediately after arrival to the floor.  We will await transbronchial biopsy results.  If they are nonspecific, I believe an open lung biopsy will be necessary this admission, given the severity  of his parenchymal lung disease and the lack of a specific diagnosis. DD:  05/03/00 TD:  05/04/00 Job: [id number]

## 2010-07-29 NOTE — Op Note (Signed)
NAME:  Philip Woodard, Philip Woodard NO.:  [id number]   MEDICAL RECORD NO.:  [id number]          PATIENT TYPE:  INP   LOCATION:  2899                         FACILITY:  [facility]   PHYSICIAN:  Early Chars. Wilburn Cornelia, M.D.DATE OF BIRTH:  April 04, 1950   DATE OF PROCEDURE:  09/09/2004  DATE OF DISCHARGE:                                 OPERATIVE REPORT   PREOPERATIVE DIAGNOSES:  1.  Right substernal thyroid mass.  2.  Status post subtotal thyroidectomy with left substernal dissection for      benign multinodular goiter (March 28, 2000).  3.  History obstructive sleep apnea.   POSTOPERATIVE DIAGNOSES:  1.  Right substernal thyroid mass.  2.  Status post subtotal thyroidectomy with left substernal dissection for      benign multinodular goiter (March 28, 2000).  3.  History obstructive sleep apnea.   INDICATIONS FOR PROCEDURE:  1.  Right substernal thyroid mass.  2.  Status post subtotal thyroidectomy with left substernal dissection for      benign multinodular goiter (March 28, 2000).  3.  History obstructive sleep apnea.   BRIEF HISTORY:  Mr. Hovis is a 60 year old black male who has been followed  with a history of a multinodular goiter. He was initially treated in January  2002, with a left hemithyroidectomy and substernal dissection for large,  approximately 8 cm, substernal thyroid goiter.  The mass was benign.  The  patient has been on Synthroid for thyroid suppression and at the time of the  surgery, the right thyroid gland was not significantly abnormal and was not  operated on.  Over the last four years, the patient has had no significant  symptoms but follow-up CT scanning for a submental neck mass revealed a  large, approximately 6 cm, right thyroid enlarged with substernal extension.  Given the patient's history and examination, I recommended that we undertake  a right hemithyroidectomy with substernal dissection to reduce the risk of  enlargement over time and  airway obstruction. The patient understood the  risks, benefits and possible complications of the surgical procedure, in  addition to typical risks of bleeding, infection, pain and scarring,  additional risks of possible postoperative hypocalcemia and recurrent  laryngeal nerve injury. The patient understood and concurred with our plan  for surgery which is scheduled as above.   SURGICAL PROCEDURE:  The patient brought to the operating room [place]. [place] A NIMS nerve integrity monitor enabled  endotracheal tube was placed for monitoring of recurrent laryngeal nerve  function throughout the operation.  The patient had been adequately  anesthetized.  He was injected with a total of 4 mL of 1% lidocaine with  1:100,000 solution epinephrine injected in the preexisting scar was prior  thyroid surgery.  After allowing adequate time for vasoconstriction and  hemostasis, the patient was prepped and draped in sterile fashion.  The  surgical procedure was begun incising his previous thyroid incision with a  #15 scalpel blade.  This was carried through the skin and underlying  subcutaneous tissue.  The platysma muscles identified on the right-hand side  and divided and subplatysmal flaps were elevated superiorly and inferiorly.  The midline was identified and divided.  Strap muscles were lateralized.  There was a significant amount of scarring from his previous surgery.  Left  side appeared normal, right side showed a large thyroid mass consistent with  multinodular goiter.  The dissection was then carried out along the capsule  of the thyroid gland. The middle thyroid vein was identified, divided and  suture ligated.  The superior pole of the thyroid gland was then dissected  and superior thyroid artery and vein were identified and suture ligated with  2-0 silk suture.  The gland was then reflected medially. Dissection was  carried out along the lateral and inferior aspect  of the gland.  The  recurrent laryngeal nerve was identified in the lateral aspect the gland and  this was confirmed with NIMS monitor and once the nerve had been identified,  dissection was carried out preserving the nerve through its entire course.  Dissection then carried out inferiorly around the inferior pole to gland,  dividing the inferior thyroid vessels and staying as close to the thyroid  capsule as possible and to preserve blood flow to the parathyroid glands.  The gland was then reflected further medially and the attachment to the  trachea was dissected with bipolar scissors. The gland was then removed  entirely and sent to pathology for gross microscopic evaluation. The  patient's wound was thoroughly irrigated with saline solution. Several areas  of point hemorrhage were cauterized with suction cautery. At the conclusion  of the procedure, the NIMS system was used to stimulate the nerve with good  conduction to  demonstrate intact right recurrent laryngeal nerve.  A 7 mm  Blake drain was then placed through a separate stab incision at the base of  the incision and strap muscles were reapproximated midline with 4-0 Vicryl  suture. The platysma muscle was reapproximated with 4-0 Vicryl suture in  interrupted fashion. Deep subcutaneous closure was achieved with the same  stitch.  Final skin closure was achieved with 5-0 Ethilon in running locked  fashion and the wound was dressed with bacitracin ointment. The patient was  then awakened from his anesthetic, extubated and transferred from the  operating room to recovery room in stable condition. No complications. Blood  loss approximately 100 mL.       DLS/MEDQ  D:  50/05/7046  T:  09/09/2004  Job:  [id number]

## 2010-09-19 ENCOUNTER — Encounter: Payer: Self-pay | Admitting: Adult Health

## 2010-09-19 ENCOUNTER — Ambulatory Visit (INDEPENDENT_AMBULATORY_CARE_PROVIDER_SITE_OTHER): Payer: BC Managed Care – PPO | Admitting: Adult Health

## 2010-09-19 VITALS — BP 134/80 | HR 72 | Temp 97.3°F | Ht 67.0 in | Wt 214.8 lb

## 2010-09-19 DIAGNOSIS — L568 Other specified acute skin changes due to ultraviolet radiation: Secondary | ICD-10-CM | POA: Insufficient documentation

## 2010-09-19 DIAGNOSIS — L559 Sunburn, unspecified: Secondary | ICD-10-CM

## 2010-09-19 MED ORDER — PREDNISONE 10 MG PO TABS: ORAL_TABLET | ORAL | Status: AC

## 2010-09-19 MED ORDER — METHYLPREDNISOLONE ACETATE 80 MG/ML IJ SUSP
120.0000 mg | Freq: Once | INTRAMUSCULAR | Status: AC
  Administered 2010-09-19: 120 mg via INTRAMUSCULAR

## 2010-09-19 NOTE — Assessment & Plan Note (Addendum)
?   Dermatitis due to sun exposure.  Unclear if this is due to hypersensitivity to sun or extreme heat duration/sun  Doubt if due to synthroid hypersensitivity reaction.   Plan:  Close follow up  Depo Medrol 137m IM  Prednisone pack Begin Zyrtec 133mdaily for 5 days  Begin Benadryl 2552mt bedtime  For 5 days  Begin Pepcid 29m67m bedtime  For 5 days  follow up 4 days and As needed   Please contact office for sooner follow up if symptoms do not improve or worsen or seek emergency care
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
## 2010-09-19 NOTE — Patient Instructions (Signed)
Prednisone taper over next week.  Begin Zyrtec 81m daily for 5 days  Begin Benadryl 249mAt bedtime  For 5 days  Begin Pepcid 2061mt bedtime  For 5 days  follow up 4 days and As needed   Please contact office for sooner follow up if symptoms do not improve or worsen or seek emergency care

## 2010-09-22 ENCOUNTER — Encounter: Payer: Self-pay | Admitting: Adult Health

## 2010-09-22 ENCOUNTER — Ambulatory Visit (INDEPENDENT_AMBULATORY_CARE_PROVIDER_SITE_OTHER): Payer: BC Managed Care – PPO | Admitting: Adult Health

## 2010-09-22 DIAGNOSIS — L559 Sunburn, unspecified: Secondary | ICD-10-CM

## 2010-09-22 NOTE — Patient Instructions (Signed)
Finish Prednisone taper .  May use the combination of  Zyrtec 89m daily, and Benadryl 281mAt bedtime , and  Pepcid 2042mrior to sun exposure Limit sun and extreme heat exposure.  Use hypoallergenic sun screen. And proper clothing if going to be out in sun  Hydrate well .  Please contact office for sooner follow up if symptoms do not improve or worsen or seek emergency care

## 2010-09-22 NOTE — Assessment & Plan Note (Addendum)
Improving with tx  Advised on possible pre tx prior to sun exposure.  Educated on sun exposure/prevention   Plan:  Finish Prednisone taper .  May use the combination of  Zyrtec 67m daily, and Benadryl 258mAt bedtime , and  Pepcid 2056mrior to sun exposure Limit sun and extreme heat exposure.  Use hypoallergenic sun screen. And proper clothing if going to be out in sun  Hydrate well .  Please contact office for sooner follow up if symptoms do not improve or worsen or seek emergency care

## 2010-09-22 NOTE — Progress Notes (Signed)
Subjective:    Patient ID: Philip Woodard, male    DOB: Apr 25, 1950, 60 y.o.   MRN: [id number]  HPI 66 yobm never smoker with sarcoidosis dx by [facility] 04/2000 rx x 3 months with prednisone initially not chronically steroid dep previously with cough /sob did not relapse off prednisone   December 26, 2007 seen in pulmonary clinic for indolent onset increasing doe x sev months , just walking down the hall. cxr suggested slt worse, ace 70, tsh decreased, so thyroid supplement decreased.   February 13, 2008 returns doing fine, no doe, rash, arthritis so no prednisone or change in rx   June 19, 2008 ov no sob, ex aerobics tol well, no cough, rash, arthralgias or optic co's, so no change in rx.  ? November 02, 2008 ov good ex tol, no articular or optic c'os or rash.   January 25, 2009 3 month followup with PFT's and cxr. Pt states that his breathing has been okay. He only c/o dry "seasonal cough" x 3 months = tickle attributable to pnds. no rash or arthraligia or ocular co's. >>no changes   09/19/2010 Acute OV  Pt presents for a work in visit. Complains of sun burn to face x 2 days. Pt was unable to get in with his PCP today . He informs me that he gets sun burned easily has had this few times in past.  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 He used a hat and long sleeves and some sunscreen. He is on Synthroid daily . No other otc meds. Previously on Lamisil however has not taken in greater than 2 months.  Noticed face was red that night and next day with puffiness along face, eyes and neck, skin tender and tight. No tongue/lips/throat swelling. No difficulty swallowing or dyspnea. Had ~2 years very similar required steroid shot.  Has been using aloe vera , sunburn creams. No rash, or skin irritation other than face and neck. No cough or discolored mucus. No headache or dizziness.  >>?photodermatitis  09/22/2010 follow up  Pt was seen 4 days ago  for skin rash after sun exposure . Felt to be from photodematitis /sensitivty reaction.  He was treated with steroid taper along with zyrtec, pepcid and benadryl. He returns today much improved . Swelling along ears  And eyes has resolved. Redness is going away. Continues to use aloe vera lotion on face.      Past Medical History:  ROUTINE GENERAL MEDICAL EXAM@HEALTH  CARE FACL (ICD-V70.0).........Marland KitchenEllison  UNSPECIFIED GENITAL HERPES (ICD-054.10)  ALLERGIC RHINITIS (ICD-477.9)  SLEEP APNEA (ICD-780.57)  SARCOIDOSIS (ICD-135)....................................................................................Marland KitchenWert  - Dx by [facility] 2/02  - PFT's nl January 25, 2009  HYPOTHYROIDISM (ICD-244.9)  GOITER, MULTINODULAR (ICD-241.1)  HEMORRHOIDS (ICD-455.6)  HYPERGLYCEMIA (ICD-790.29)     Review of Systems  Constitutional:   No  weight loss, night sweats,  Fevers, chills, fatigue, or  lassitude.  HEENT:   No headaches,  Difficulty swallowing,  Tooth/dental problems, or  Sore throat,                No sneezing, itching, ear ache, nasal congestion, post nasal drip,   CV:  No chest pain,  Orthopnea, PND, swelling in lower extremities, anasarca, dizziness, palpitations, syncope.   GI  No heartburn, indigestion, abdominal pain, nausea, vomiting, diarrhea, change in bowel habits, loss of appetite, bloody stools.   Resp: No shortness of breath with exertion or at rest.  No excess mucus, no  productive cough,  No non-productive cough,  No coughing up of blood.  No change in color of mucus.  No wheezing.  No chest wall deformity  Skin: +resolving sunburn on face and neck   GU: no dysuria, change in color of urine, no urgency or frequency.  No flank pain, no hematuria   MS:  No joint pain or swelling.  No decreased range of motion.  No back pain.  Psych:  No change in mood or affect. No depression or anxiety.  No memory loss.          Objective:   Physical Exam  GEN: A/Ox3; pleasant , NAD,  well nourished   HEENT:  [place]/AT,  EACs-clear, TMs-wnl, NOSE-clear, THROAT-clear, no lesions, no postnasal drip or exudate noted.   NECK:  Supple w/ fair ROM; no JVD; normal carotid impulses w/o bruits; no thyromegaly or nodules palpated; no lymphadenopathy.  RESP  Clear  P & A; w/o, wheezes/ rales/ or rhonchi.no accessory muscle use, no dullness to percussion  CARD:  RRR, no m/r/g  , no peripheral edema, pulses intact, no cyanosis or clubbing.  GI:   Soft & nt; nml bowel sounds; no organomegaly or masses detected.  Musco: Warm bil, no deformities or joint swelling noted.   Neuro: alert, no focal deficits noted.    Skin: Warm, resolved swelling along eyes and ears. Skin with decreased redness Minimal skin peeling along beard line.        Assessment & Plan:

## 2010-10-26 ENCOUNTER — Ambulatory Visit (INDEPENDENT_AMBULATORY_CARE_PROVIDER_SITE_OTHER): Payer: BC Managed Care – PPO | Admitting: Internal Medicine

## 2010-10-26 ENCOUNTER — Encounter: Payer: Self-pay | Admitting: Internal Medicine

## 2010-10-26 VITALS — BP 112/70 | HR 64 | Temp 98.6°F | Ht 67.0 in | Wt 206.4 lb

## 2010-10-26 DIAGNOSIS — M542 Cervicalgia: Secondary | ICD-10-CM | POA: Insufficient documentation

## 2010-10-26 DIAGNOSIS — Y92410 Unspecified street and highway as the place of occurrence of the external cause: Secondary | ICD-10-CM | POA: Insufficient documentation

## 2010-10-26 DIAGNOSIS — I10 Essential (primary) hypertension: Secondary | ICD-10-CM

## 2010-10-26 MED ORDER — IBUPROFEN 800 MG PO TABS: 800.0000 mg | ORAL_TABLET | Freq: Three times a day (TID) | ORAL | Status: AC | PRN

## 2010-10-26 MED ORDER — CYCLOBENZAPRINE HCL 5 MG PO TABS: 5.0000 mg | ORAL_TABLET | Freq: Three times a day (TID) | ORAL | Status: AC | PRN

## 2010-10-26 NOTE — Patient Instructions (Signed)
Take all new medications as prescribed Continue all other medications as before  

## 2010-10-26 NOTE — Assessment & Plan Note (Signed)
Exam o/w benign, no other acute issues noted, pt reassured, no further eval or tx needed
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
## 2010-10-26 NOTE — Assessment & Plan Note (Signed)
stable overall by hx and exam, most recent data reviewed with pt, and pt to continue medical treatment as before  BP Readings from Last 3 Encounters:  10/26/10 112/70  09/22/10 134/84  09/19/10 134/80

## 2011-03-04 ENCOUNTER — Telehealth: Payer: Self-pay | Admitting: Oncology

## 2011-03-04 NOTE — Telephone Encounter (Signed)
lmonvm adviisng the pt of her r/s appts from [place] to feb and march due to the md will be out of the office for surgery

## 2011-03-20 ENCOUNTER — Other Ambulatory Visit: Payer: BC Managed Care – PPO | Admitting: Lab

## 2011-04-14 ENCOUNTER — Other Ambulatory Visit: Payer: Self-pay | Admitting: Endocrinology

## 2011-04-22 ENCOUNTER — Other Ambulatory Visit: Payer: Self-pay | Admitting: Endocrinology

## 2011-05-11 ENCOUNTER — Other Ambulatory Visit: Payer: BC Managed Care – PPO | Admitting: Lab

## 2011-05-15 ENCOUNTER — Ambulatory Visit: Payer: BC Managed Care – PPO | Admitting: Oncology

## 2011-06-14 ENCOUNTER — Other Ambulatory Visit: Payer: Self-pay | Admitting: Oncology

## 2011-06-14 ENCOUNTER — Telehealth: Payer: Self-pay | Admitting: *Deleted

## 2011-06-14 DIAGNOSIS — D869 Sarcoidosis, unspecified: Secondary | ICD-10-CM

## 2011-06-14 NOTE — Telephone Encounter (Signed)
left voice mail message to inform the patient of the new date and time

## 2011-06-21 ENCOUNTER — Other Ambulatory Visit: Payer: Self-pay | Admitting: Oncology

## 2011-06-23 ENCOUNTER — Telehealth: Payer: Self-pay | Admitting: Oncology

## 2011-06-23 ENCOUNTER — Other Ambulatory Visit (HOSPITAL_BASED_OUTPATIENT_CLINIC_OR_DEPARTMENT_OTHER): Payer: Federal, State, Local not specified - PPO | Admitting: Lab

## 2011-06-23 ENCOUNTER — Ambulatory Visit (HOSPITAL_COMMUNITY)
Admission: RE | Admit: 2011-06-23 | Discharge: 2011-06-23 | Disposition: A | Discharge status: Home / Self Care | Payer: Federal, State, Local not specified - PPO | Source: Ambulatory Visit | Attending: Oncology | Admitting: Oncology

## 2011-06-23 DIAGNOSIS — E039 Hypothyroidism, unspecified: Secondary | ICD-10-CM

## 2011-06-23 DIAGNOSIS — D869 Sarcoidosis, unspecified: Secondary | ICD-10-CM | POA: Insufficient documentation

## 2011-06-23 DIAGNOSIS — R918 Other nonspecific abnormal finding of lung field: Secondary | ICD-10-CM | POA: Insufficient documentation

## 2011-06-23 DIAGNOSIS — I517 Cardiomegaly: Secondary | ICD-10-CM | POA: Insufficient documentation

## 2011-06-23 DIAGNOSIS — M4120 Other idiopathic scoliosis, site unspecified: Secondary | ICD-10-CM | POA: Insufficient documentation

## 2011-06-23 LAB — CBC WITH DIFFERENTIAL/PLATELET
Eosinophils Absolute: 0.3 10*3/uL (ref 0.0–0.5)
HCT: 45.9 % (ref 38.4–49.9)
LYMPH%: 43.4 % (ref 14.0–49.0)
MCHC: 33.9 g/dL (ref 32.0–36.0)
MCV: 91.1 fL (ref 79.3–98.0)
MONO%: 8.5 % (ref 0.0–14.0)
NEUT#: 3 10*3/uL (ref 1.5–6.5)
NEUT%: 43.1 % (ref 39.0–75.0)
Platelets: 218 10*3/uL (ref 140–400)
RBC: 5.03 10*6/uL (ref 4.20–5.82)
nRBC: 0 % (ref 0–0)

## 2011-06-23 NOTE — Telephone Encounter (Signed)
Called pt today and left message regarding new appt date 4/16 per MD, lab schduled for today 4/12 4:15 pm

## 2011-06-26 LAB — COMPREHENSIVE METABOLIC PANEL
ALT: 24 U/L (ref 0–53)
BUN: 13 mg/dL (ref 6–23)
CO2: 28 mEq/L (ref 19–32)
Calcium: 9.4 mg/dL (ref 8.4–10.5)
Creatinine, Ser: 0.97 mg/dL (ref 0.50–1.35)
Total Bilirubin: 0.8 mg/dL (ref 0.3–1.2)

## 2011-06-26 LAB — LACTATE DEHYDROGENASE: LDH: 246 U/L (ref 94–250)

## 2011-06-27 ENCOUNTER — Ambulatory Visit (HOSPITAL_BASED_OUTPATIENT_CLINIC_OR_DEPARTMENT_OTHER): Payer: Federal, State, Local not specified - PPO | Admitting: Oncology

## 2011-06-27 ENCOUNTER — Encounter: Payer: Self-pay | Admitting: Oncology

## 2011-06-27 VITALS — BP 124/72 | HR 104 | Temp 97.5°F | Ht 67.0 in | Wt 211.2 lb

## 2011-06-27 DIAGNOSIS — R599 Enlarged lymph nodes, unspecified: Secondary | ICD-10-CM

## 2011-06-27 DIAGNOSIS — R748 Abnormal levels of other serum enzymes: Secondary | ICD-10-CM

## 2011-06-27 DIAGNOSIS — D869 Sarcoidosis, unspecified: Secondary | ICD-10-CM

## 2011-06-27 HISTORY — DX: Enlarged lymph nodes, unspecified: R59.9

## 2011-06-28 ENCOUNTER — Other Ambulatory Visit: Payer: BC Managed Care – PPO | Admitting: Lab

## 2011-06-28 ENCOUNTER — Encounter: Payer: Self-pay | Admitting: Oncology

## 2011-06-28 DIAGNOSIS — R748 Abnormal levels of other serum enzymes: Secondary | ICD-10-CM | POA: Insufficient documentation

## 2011-06-28 HISTORY — DX: Abnormal levels of other serum enzymes: R74.8

## 2011-06-28 NOTE — Progress Notes (Signed)
Hematology and Oncology Follow Up Visit  Philip Woodard [id number] 14-Feb-1951 61 y.o. 06/28/2011 10:27 AM   Principle Diagnosis: Encounter Diagnosis  Name Primary?  . Hyperplastic lymph node Yes     Interim History:   Followup visit for this pleasant 61 year old man with known sarcoidosis which has been inactive and not required any recent treatment..  I saw him initially back in 01/2005 to evaluate recurrent submental lymphadenopathy.  A submental node was excised in 12/2004 with previous resection in 03/2000.  Nodes showed follicular hyperplasia.  The 12/2004 biopsy showed a small focus of atypical lymphoid proliferation.  Findings were not diagnostic for lymphoma.  He had small mediastinal lymph gland enlargement on CT scan, and other patchy pulmonary parenchymal changes consistent with his sarcoid, and these were stable over time.  When CT scans remained stable over a 2-year interval, I elected to follow him just with regular chest radiographs on an annual basis, and these have also remained unchanged including most recent study done  06/23/2011 which I personally reviewed with him today.  He has had no interim medical problems. He gets through the winter without any significant infection even though he was exposed to people who have the Nor virus. No cough or bronchitis. No constitutional symptoms. 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 Despite his seafood allergy, since he is raising the shrimp in fresh water, he is able to he can without getting a reaction.   Medications: reviewed  Allergies:  Allergies  Allergen Reactions  . Other     SEAFOOD    Review of Systems: Constitutional:   No constitutional symptoms Respiratory: No cough or dyspnea Cardiovascular:  No chest pain or palpitations Gastrointestinal: No change in bowel habit Genito-Urinary: No urinary tract symptoms Musculoskeletal: No muscle or bone pain Neurologic: No headache or change in  vision Skin: No rash or ecchymosis Remaining ROS negative.  Physical Exam: Blood pressure 124/72, pulse 104, temperature 97.5 F (36.4 C), temperature source Oral, height 5' 7"  (1.702 m), weight 211 lb 3.2 oz (95.8 kg). Wt Readings from Last 3 Encounters:  06/27/11 211 lb 3.2 oz (95.8 kg)  10/26/10 206 lb 6 oz (93.611 kg)  09/22/10 210 lb 3.2 oz (95.346 kg)     General appearance: Pleasant well-nourished African American man HENNT: Tail of the parotid glands are prominent bilaterally but symmetric, pharynx no erythema exudate or mass, and previous thyroidectomy scar. Lymph nodes: No cervical supraclavicular submental or axillary lymphadenopathy Breasts: Lungs: Clear to auscultation resonant to percussion Heart: Regular rhythm no murmur Abdomen: Soft nontender no mass no organomegaly Extremities: No edema no calf tenderness Vascular: No cyanosis Neurologic: No focal deficit Skin: No rash or ecchymosis  Lab Results: Lab Results  Component Value Date   WBC 7.0 06/23/2011   HGB 15.5 06/23/2011   HCT 45.9 06/23/2011   MCV 91.1 06/23/2011   PLT 218 06/23/2011     Chemistry      Component Value Date/Time   NA 140 06/23/2011 1637   K 3.6 06/23/2011 1637   CL 102 06/23/2011 1637   CO2 28 06/23/2011 1637   BUN 13 06/23/2011 1637   CREATININE 0.97 06/23/2011 1637      Component Value Date/Time   CALCIUM 9.4 06/23/2011 1637   ALKPHOS 85 06/23/2011 1637   AST 30 06/23/2011 1637   ALT 24 06/23/2011 1637   BILITOT 0.8 06/23/2011 1637       Radiological Studies: See above   Impression and Plan:  1. Submental lymphadenopathy showing follicular hyperplasia.  No further recurrence since 12/2004. 2. He has now been followed for over 6 years since second submental lymph node biopsy and has not developed any obvious lymphoproliferative process. I told him I would be happy to graduate  Him from  our practice at this time and see him only on an as needed basis.   3. Sarcoidosis, currently  asymptomatic on no treatment. 4. Hypothyroid on replacement with history of previous thyroidectomy for benign multinodular goiter. 5. High normal hemoglobin and hematocrit in the past. Hemoglobin currently normal. No further evaluation indicated. 6. Fluctuating elevation of alkaline phosphatase. I believe this is likely related to granuloma formation in his liver related to sarcoidosis. I brought this to his attention. I don't think he needs further evaluation at this time.  CC:. Dr. Renato Shin; Christinia Gully; Jerrell Belfast   Annia Belt, MD 4/17/201310:27 AM

## 2011-07-05 ENCOUNTER — Ambulatory Visit: Payer: BC Managed Care – PPO | Admitting: Oncology

## 2011-07-20 ENCOUNTER — Other Ambulatory Visit: Payer: Self-pay | Admitting: Endocrinology

## 2011-08-22 ENCOUNTER — Other Ambulatory Visit: Payer: Self-pay | Admitting: Endocrinology

## 2011-08-29 ENCOUNTER — Telehealth: Payer: Self-pay | Admitting: *Deleted

## 2011-08-29 DIAGNOSIS — Z0389 Encounter for observation for other suspected diseases and conditions ruled out: Secondary | ICD-10-CM

## 2011-08-29 DIAGNOSIS — Z0000 Encounter for general adult medical examination without abnormal findings: Secondary | ICD-10-CM

## 2011-08-29 NOTE — Telephone Encounter (Signed)
Message copied by Legrand Como on Tue Aug 29, 2011  2:22 PM ------      Message from: Sherral Hammers      Created: Fri Aug 18, 2011  3:47 PM       PHYSICAL LABS Henriette Combs 28 APPT / NOT DM

## 2011-08-29 NOTE — Telephone Encounter (Signed)
Labs placed into Epic for upcoming CPX appointment.

## 2011-09-08 ENCOUNTER — Encounter: Payer: Federal, State, Local not specified - PPO | Admitting: Endocrinology

## 2011-09-11 ENCOUNTER — Other Ambulatory Visit (INDEPENDENT_AMBULATORY_CARE_PROVIDER_SITE_OTHER): Payer: Federal, State, Local not specified - PPO

## 2011-09-11 DIAGNOSIS — Z0000 Encounter for general adult medical examination without abnormal findings: Secondary | ICD-10-CM

## 2011-09-11 DIAGNOSIS — Z0389 Encounter for observation for other suspected diseases and conditions ruled out: Secondary | ICD-10-CM

## 2011-09-11 LAB — URINALYSIS, ROUTINE W REFLEX MICROSCOPIC
Hgb urine dipstick: NEGATIVE
Nitrite: NEGATIVE
Specific Gravity, Urine: 1.025 (ref 1.000–1.030)
Total Protein, Urine: NEGATIVE
Urine Glucose: NEGATIVE
pH: 6 (ref 5.0–8.0)

## 2011-09-11 LAB — LIPID PANEL: Cholesterol: 140 mg/dL (ref 0–200)

## 2011-09-11 LAB — CBC WITH DIFFERENTIAL/PLATELET
Basophils Relative: 0.5 % (ref 0.0–3.0)
Eosinophils Absolute: 0.4 10*3/uL (ref 0.0–0.7)
Eosinophils Relative: 4.2 % (ref 0.0–5.0)
HCT: 46.5 % (ref 39.0–52.0)
Lymphs Abs: 3.4 10*3/uL (ref 0.7–4.0)
MCHC: 34.1 g/dL (ref 30.0–36.0)
MCV: 91.3 fl (ref 78.0–100.0)
Monocytes Absolute: 1.1 10*3/uL — ABNORMAL HIGH (ref 0.1–1.0)
Neutrophils Relative %: 47 % (ref 43.0–77.0)
Platelets: 206 10*3/uL (ref 150.0–400.0)
RBC: 5.1 Mil/uL (ref 4.22–5.81)
WBC: 9.2 10*3/uL (ref 4.5–10.5)

## 2011-09-11 LAB — BASIC METABOLIC PANEL
BUN: 20 mg/dL (ref 6–23)
Calcium: 9.4 mg/dL (ref 8.4–10.5)
GFR: 96.05 mL/min (ref >60.00)
Glucose, Bld: 90 mg/dL (ref 70–99)

## 2011-09-11 LAB — HEPATIC FUNCTION PANEL
Albumin: 3.9 g/dL (ref 3.5–5.2)
Alkaline Phosphatase: 71 U/L (ref 39–117)
Bilirubin, Direct: 0.2 mg/dL (ref 0.0–0.3)
Total Bilirubin: 1.3 mg/dL — ABNORMAL HIGH (ref 0.3–1.2)

## 2011-09-13 ENCOUNTER — Ambulatory Visit (INDEPENDENT_AMBULATORY_CARE_PROVIDER_SITE_OTHER): Payer: Federal, State, Local not specified - PPO | Admitting: Endocrinology

## 2011-09-13 ENCOUNTER — Encounter: Payer: Self-pay | Admitting: Endocrinology

## 2011-09-13 ENCOUNTER — Other Ambulatory Visit: Payer: Federal, State, Local not specified - PPO

## 2011-09-13 VITALS — BP 118/78 | HR 86 | Temp 97.0°F | Ht 67.0 in | Wt 215.0 lb

## 2011-09-13 DIAGNOSIS — K769 Liver disease, unspecified: Secondary | ICD-10-CM

## 2011-09-13 DIAGNOSIS — I10 Essential (primary) hypertension: Secondary | ICD-10-CM

## 2011-09-13 MED ORDER — FLUTICASONE PROPIONATE 50 MCG/ACT NA SUSP: 2.0000 | Freq: Every day | NASAL | Status: DC

## 2011-09-13 MED ORDER — CICLOPIROX 8 % EX SOLN: Freq: Every day | CUTANEOUS | Status: AC

## 2011-09-13 MED ORDER — LEVOTHYROXINE SODIUM 175 MCG PO TABS: ORAL_TABLET | ORAL | Status: DC

## 2011-09-13 MED ORDER — ACYCLOVIR 800 MG PO TABS: ORAL_TABLET | ORAL | Status: DC

## 2011-09-13 NOTE — Patient Instructions (Addendum)
blood tests are being requested for you today.  You will receive a letter with results.   i have sent a prescription to your pharmacy, for a nasal steroid spray.  Call if this does not help, and i would be happy to refer you to an allergy specialist.   please consider these measures for your health:  minimize alcohol.  do not use tobacco products.  have a colonoscopy at least every 10 years from age 61.  keep firearms safely stored.  always use seat belts.  have working smoke alarms in your home.  see an eye doctor and dentist regularly.  never drive under the influence of alcohol or drugs (including prescription drugs).   Please return in 1 year.   Also, i have sent a prescription to your pharmacy, for a liquid to apply to your left brig toenail.

## 2011-09-13 NOTE — Progress Notes (Signed)
Subjective:    Patient ID: Philip Woodard, male    DOB: Mar 11, 1951, 61 y.o.   MRN: [id number]  HPI here for regular wellness examination.  He's feeling pretty well in general, and says chronic med probs are stable, except as noted below Past Medical History  Diagnosis Date  . Routine general medical examination at a health care facility   . Genital herpes   . Allergic rhinitis   . Sleep apnea   . Sarcoidosis   . Hypothyroidism   . Multinodular goiter   . Hemorrhoids   . Hyperglycemia   . Hyperplastic lymph node 06/27/2011    Submental node excised 1/02; regrowth: re-excision 10/06  . Alkaline phosphatase raised 06/28/2011    Fluctuating - suspect due to sarcoidosis    No past surgical history on file.  History   Social History  . Marital Status: Married    Spouse Name: N/A    Number of Children: N/A  . Years of Education: N/A   Occupational History  . Not on file.   Social History Main Topics  . Smoking status: Never Smoker   . Smokeless tobacco: Not on file  . Alcohol Use: Yes     3 times per month  . Drug Use: Not on file  . Sexually Active: Not on file   Other Topics Concern  . Not on file   Social History Narrative  . No narrative on file    Current Outpatient Prescriptions on File Prior to Visit  Medication Sig Dispense Refill  . levothyroxine (SYNTHROID, LEVOTHROID) 175 MCG tablet TAKE 1 TABLET BY MOUTH EVERY DAY  30 tablet  11  . fluticasone (FLONASE) 50 MCG/ACT nasal spray Place 2 sprays into the nose daily.  16 g  6    Allergies  Allergen Reactions  . Other     SEAFOOD    Family History  Problem Relation Age of Onset  . Asthma Cousin   . Sarcoidosis Neg Hx     BP 118/78  Pulse 86  Temp 97 F (36.1 C) (Oral)  Ht 5' 7"  (1.702 m)  Wt 215 lb (97.523 kg)  BMI 33.67 kg/m2  SpO2 96%     Review of Systems  Constitutional: Negative for fever and unexpected weight change.  HENT: Negative for hearing loss.   Eyes: Negative for visual  disturbance.  Respiratory: Negative for shortness of breath.   Cardiovascular: Negative for chest pain.  Gastrointestinal: Negative for anal bleeding.  Genitourinary: Negative for hematuria and difficulty urinating.  Skin: Negative for rash.  Neurological: Negative for syncope.  Hematological: Does not bruise/bleed easily.  Psychiatric/Behavioral: Negative for dysphoric mood.       Objective:   Physical Exam VS: see vs page GEN: no distress HEAD: head: no deformity eyes: no periorbital swelling, no proptosis external nose and ears are normal mouth: no lesion seen Neck: a healed scar is present.  i do not appreciate a nodule in the thyroid or elsewhere in the neck CHEST WALL: no deformity LUNGS: clear to auscultation CV: reg rate and rhythm, no murmur ABD: abdomen is soft, nontender.  no hepatosplenomegaly.  not distended.  no hernia.   MUSCULOSKELETAL: muscle bulk and strength are grossly normal.  no obvious joint swelling.  gait is normal and steady GENITAL/PROSTATE: sees urology PULSES: dorsalis pedis intact bilat.  no carotid bruit NEURO:  cn 2-12 grossly intact.   readily moves all 4's.  sensation is intact to touch on the feet SKIN:  Normal texture  and temperature.  No rash or suspicious lesion is visible.  Old healed surgical scars at both shoulders. NODES:  None palpable at the neck. PSYCH: alert, oriented x3.  Does not appear anxious nor depressed.      Assessment & Plan:  Wellness visit today, with problems stable, except as noted.    SEPARATE EVALUATION FOLLOWS--EACH PROBLEM HERE IS NEW, NOT RESPONDING TO TREATMENT, OR POSES SIGNIFICANT RISK TO THE PATIENT'S HEALTH: HISTORY OF THE PRESENT ILLNESS: Pt states few years of thickening of the left great toenail, and assoc cracking PAST MEDICAL HISTORY reviewed and up to date today. REVIEW OF SYSTEMS: He has nasal congestion despite claritin-d.  Denies numbness PHYSICAL EXAMINATION: Pulses: dorsalis pedis intact  bilat.   Feet: no deformity.  no ulcer on the feet.  feet are of normal color and temp.  no edema.  There is onychomycosis of the left great toe. VITAL SIGNS:  See vs page GENERAL: no distress LAB/XRAY RESULTS: Lab Results  Component Value Date   WBC 9.2 09/11/2011   HGB 15.9 09/11/2011   HCT 46.5 09/11/2011   PLT 206.0 09/11/2011   GLUCOSE 90 09/11/2011   CHOL 140 09/11/2011   TRIG 78.0 09/11/2011   HDL 34.20* 09/11/2011   LDLCALC 90 09/11/2011   ALT 29 09/11/2011   AST 50* 09/11/2011   NA 140 09/11/2011   K 4.1 09/11/2011   CL 103 09/11/2011   CREATININE 1.0 09/11/2011   BUN 20 09/11/2011   CO2 28 09/11/2011   TSH 0.53 09/11/2011   PSA 1.05 09/11/2011   INR 1.04 02/18/2009   HGBA1C 5.5 11/24/2008  IMPRESSION: elev transaminase, prob due to sacroidosis Onychomycosis, new allergic rhinitis, persistent PLAN: See instruction page

## 2011-09-14 ENCOUNTER — Encounter: Payer: Self-pay | Admitting: Endocrinology

## 2011-09-14 LAB — HEPATITIS B SURFACE ANTIGEN: Hepatitis B Surface Ag: NEGATIVE

## 2011-09-18 ENCOUNTER — Telehealth: Payer: Self-pay | Admitting: *Deleted

## 2011-09-18 NOTE — Telephone Encounter (Signed)
Called pt to inform of lab results, left message for pt to callback office (letter also mailed to pt). 

## 2011-11-30 ENCOUNTER — Encounter: Payer: Self-pay | Admitting: Gastroenterology

## 2011-12-12 ENCOUNTER — Ambulatory Visit (INDEPENDENT_AMBULATORY_CARE_PROVIDER_SITE_OTHER): Payer: Federal, State, Local not specified - PPO | Admitting: Internal Medicine

## 2011-12-12 ENCOUNTER — Encounter: Payer: Self-pay | Admitting: Internal Medicine

## 2011-12-12 VITALS — BP 124/72 | HR 84 | Temp 98.3°F | Ht 67.0 in | Wt 215.6 lb

## 2011-12-12 DIAGNOSIS — D869 Sarcoidosis, unspecified: Secondary | ICD-10-CM

## 2011-12-12 DIAGNOSIS — R599 Enlarged lymph nodes, unspecified: Secondary | ICD-10-CM

## 2011-12-12 NOTE — Patient Instructions (Addendum)
Return April 2013 for chest xray and pft's on return  If lymph node gets a lot worse may need to see ENT doctor

## 2011-12-12 NOTE — Assessment & Plan Note (Addendum)
Submental node excised 1/02; regrowth: re-excision 12/2004 > hyperplastic, no NCG, neg flow cytometry.  Not clear this is the same process but note he has noticed regression(indicating benign reactive node) in size since first detected, but if grows again consider ent referral as very unlikely this is sarcoid ( has seen [facility]
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
## 2011-12-12 NOTE — Assessment & Plan Note (Signed)
-   Dx by [facility] 04/2000   - PFT's nl January 25, 2009  No clinical evidence of pulmonary or systemic dz activity now almost 10 years p last required prednisone but does need yearly f/u cxr and since no pft's in 3 years we'll do this on return as well and if stable as suspected just f/u prn from that point onward

## 2012-02-19 ENCOUNTER — Telehealth: Payer: Self-pay | Admitting: Internal Medicine

## 2012-02-19 NOTE — Telephone Encounter (Signed)
Left Message 3x to schd follow up apt. Sent letter 02/19/12.

## 2012-03-29 DIAGNOSIS — E039 Hypothyroidism, unspecified: Secondary | ICD-10-CM | POA: Insufficient documentation

## 2012-05-01 ENCOUNTER — Ambulatory Visit (INDEPENDENT_AMBULATORY_CARE_PROVIDER_SITE_OTHER): Payer: Federal, State, Local not specified - PPO | Admitting: Endocrinology

## 2012-05-01 VITALS — BP 126/80 | HR 118 | Wt 215.0 lb

## 2012-05-01 DIAGNOSIS — R42 Dizziness and giddiness: Secondary | ICD-10-CM | POA: Insufficient documentation

## 2012-05-01 LAB — CBC WITH DIFFERENTIAL/PLATELET
Basophils Absolute: 0 10*3/uL (ref 0.0–0.1)
Eosinophils Relative: 0.7 % (ref 0.0–5.0)
HCT: 51.2 % (ref 39.0–52.0)
Lymphs Abs: 1.3 10*3/uL (ref 0.7–4.0)
Monocytes Relative: 6.4 % (ref 3.0–12.0)
Neutrophils Relative %: 82.1 % — ABNORMAL HIGH (ref 43.0–77.0)
Platelets: 203 10*3/uL (ref 150.0–400.0)
RDW: 14.5 % (ref 11.5–14.6)
WBC: 12.1 10*3/uL — ABNORMAL HIGH (ref 4.5–10.5)

## 2012-05-01 LAB — BASIC METABOLIC PANEL
BUN: 25 mg/dL — ABNORMAL HIGH (ref 6–23)
Calcium: 9.4 mg/dL (ref 8.4–10.5)
Creatinine, Ser: 1.3 mg/dL (ref 0.4–1.5)
GFR: 74.42 mL/min (ref >60.00)
Glucose, Bld: 100 mg/dL — ABNORMAL HIGH (ref 70–99)
Potassium: 4.1 mEq/L (ref 3.5–5.1)

## 2012-05-01 MED ORDER — ONDANSETRON HCL 4 MG PO TABS: 4.0000 mg | ORAL_TABLET | Freq: Three times a day (TID) | ORAL | Status: DC | PRN

## 2012-05-01 NOTE — Patient Instructions (Addendum)
Here are some samples of "prilosec otc."  Take 2 per day blood tests are being requested for you today.  We'll contact you with results. i have sent a prescription to your pharmacy, for an anti-nausea pill.   I hope you feel better soon.  If you don't feel better by next week, please call back.  Please call sooner if you get worse. Please rest for a few days, and stand up slowly. Drink plenty of fluids.

## 2012-05-01 NOTE — Progress Notes (Signed)
Subjective:    Patient ID: Philip Woodard, male    DOB: 11-15-1950, 62 y.o.   MRN: [id number]  HPI Pt states 6 hrs of diarrhea, but no bleeding from the rectum.  He had associated moderate lightheadedness, and he fell, striking his head.  Past Medical History  Diagnosis Date  . Routine general medical examination at a health care facility   . Genital herpes   . Allergic rhinitis   . Sleep apnea   . Sarcoidosis   . Hypothyroidism   . Multinodular goiter   . Hemorrhoids   . Hyperglycemia   . Hyperplastic lymph node 06/27/2011    Submental node excised 1/02; regrowth: re-excision 10/06  . Alkaline phosphatase raised 06/28/2011    Fluctuating - suspect due to sarcoidosis    No past surgical history on file.  History   Social History  . Marital Status: Married    Spouse Name: N/A    Number of Children: N/A  . Years of Education: N/A   Occupational History  . Not on file.   Social History Main Topics  . Smoking status: Never Smoker   . Smokeless tobacco: Not on file  . Alcohol Use: Yes     Comment: 3 times per month  . Drug Use: Not on file  . Sexually Active: Not on file   Other Topics Concern  . Not on file   Social History Narrative  . No narrative on file    Current Outpatient Prescriptions on File Prior to Visit  Medication Sig Dispense Refill  . acyclovir (ZOVIRAX) 800 MG tablet TAKE 1 TABLET BY MOUTH EVERY DAY  90 tablet  3  . fluticasone (FLONASE) 50 MCG/ACT nasal spray Place 2 sprays into the nose daily.  16 g  6  . levothyroxine (SYNTHROID, LEVOTHROID) 175 MCG tablet TAKE 1 TABLET BY MOUTH EVERY DAY  30 tablet  11   No current facility-administered medications on file prior to visit.    Allergies  Allergen Reactions  . Other     SEAFOOD    Family History  Problem Relation Age of Onset  . Asthma Cousin   . Sarcoidosis Neg Hx     BP 126/80  Pulse 118  Wt 215 lb (97.523 kg)  BMI 33.67 kg/m2  SpO2 96%  Review of Systems Denies fever, but  he is uncertain if he had LOC.  He has slight nausea, but no vomiting.  He has had indigestion x a few weeks.      Objective:   Physical Exam VITAL SIGNS:  See vs page GENERAL: no distress head: no deformity Left parietal area: 2 cm laceration, no bleeding.   eyes: no periorbital swelling, no proptosis external nose and ears are normal mouth: no lesion seen Both eac's and tm's are normal. Neck: supple LUNGS:  Clear to auscultation HEART:  Regular rate and rhythm without murmurs noted. Normal S1,S2.   ABDOMEN: abdomen is soft, nontender.  no hepatosplenomegaly.   not distended.  no hernia Neuro: CN are intact bilaterally.  Gait normal and steady.    Lab Results  Component Value Date   WBC 12.1* 05/01/2012   HGB 17.3* 05/01/2012   HCT 51.2 05/01/2012   PLT 203.0 05/01/2012   GLUCOSE 100* 05/01/2012   CHOL 140 09/11/2011   TRIG 78.0 09/11/2011   HDL 34.20* 09/11/2011   LDLCALC 90 09/11/2011   ALT 29 09/11/2011   AST 50* 09/11/2011   NA 137 05/01/2012   K 4.1 05/01/2012  CL 103 05/01/2012   CREATININE 1.3 05/01/2012   BUN 25* 05/01/2012   CO2 27 05/01/2012   TSH 0.53 09/11/2011   PSA 1.05 09/11/2011   INR 1.04 02/18/2009   HGBA1C 5.5 11/24/2008      Assessment & Plan:  Head contusion, new Dyspeptic sxs, new, uncertain etiology Gastroenteritis, new, usually viral

## 2012-05-02 ENCOUNTER — Telehealth: Payer: Self-pay | Admitting: Internal Medicine

## 2012-05-02 NOTE — Telephone Encounter (Signed)
error 

## 2012-09-16 ENCOUNTER — Other Ambulatory Visit: Payer: Self-pay | Admitting: Endocrinology

## 2012-09-17 ENCOUNTER — Other Ambulatory Visit: Payer: Self-pay | Admitting: *Deleted

## 2012-09-17 MED ORDER — FLUTICASONE PROPIONATE 50 MCG/ACT NA SUSP: 2.0000 | Freq: Every day | NASAL | Status: DC

## 2012-10-02 ENCOUNTER — Other Ambulatory Visit: Payer: Self-pay | Admitting: Endocrinology

## 2012-12-30 ENCOUNTER — Other Ambulatory Visit: Payer: Self-pay

## 2012-12-31 ENCOUNTER — Other Ambulatory Visit: Payer: Self-pay | Admitting: *Deleted

## 2012-12-31 MED ORDER — LEVOTHYROXINE SODIUM 175 MCG PO TABS: 175.0000 ug | ORAL_TABLET | Freq: Every day | ORAL | Status: DC

## 2013-01-22 ENCOUNTER — Ambulatory Visit (INDEPENDENT_AMBULATORY_CARE_PROVIDER_SITE_OTHER): Payer: Federal, State, Local not specified - PPO | Admitting: Internal Medicine

## 2013-01-22 DIAGNOSIS — D869 Sarcoidosis, unspecified: Secondary | ICD-10-CM

## 2013-01-22 DIAGNOSIS — J45909 Unspecified asthma, uncomplicated: Secondary | ICD-10-CM

## 2013-01-22 LAB — PULMONARY FUNCTION TEST

## 2013-01-22 NOTE — Progress Notes (Signed)
PFT done today. 

## 2013-01-24 ENCOUNTER — Encounter: Payer: Self-pay | Admitting: Internal Medicine

## 2013-01-24 ENCOUNTER — Ambulatory Visit (INDEPENDENT_AMBULATORY_CARE_PROVIDER_SITE_OTHER): Payer: Federal, State, Local not specified - PPO | Admitting: Internal Medicine

## 2013-01-24 ENCOUNTER — Ambulatory Visit (INDEPENDENT_AMBULATORY_CARE_PROVIDER_SITE_OTHER)
Admission: RE | Admit: 2013-01-24 | Discharge: 2013-01-24 | Disposition: A | Discharge status: Home / Self Care | Payer: Federal, State, Local not specified - PPO | Source: Ambulatory Visit | Attending: Internal Medicine | Admitting: Internal Medicine

## 2013-01-24 ENCOUNTER — Encounter (INDEPENDENT_AMBULATORY_CARE_PROVIDER_SITE_OTHER): Payer: Self-pay

## 2013-01-24 ENCOUNTER — Other Ambulatory Visit (INDEPENDENT_AMBULATORY_CARE_PROVIDER_SITE_OTHER): Payer: Federal, State, Local not specified - PPO

## 2013-01-24 VITALS — BP 130/84 | HR 73 | Temp 97.8°F | Ht 67.5 in | Wt 215.0 lb

## 2013-01-24 DIAGNOSIS — R0609 Other forms of dyspnea: Secondary | ICD-10-CM

## 2013-01-24 DIAGNOSIS — D869 Sarcoidosis, unspecified: Secondary | ICD-10-CM

## 2013-01-24 DIAGNOSIS — R0600 Dyspnea, unspecified: Secondary | ICD-10-CM | POA: Insufficient documentation

## 2013-01-24 DIAGNOSIS — R0989 Other specified symptoms and signs involving the circulatory and respiratory systems: Secondary | ICD-10-CM

## 2013-01-24 DIAGNOSIS — E890 Postprocedural hypothyroidism: Secondary | ICD-10-CM

## 2013-01-24 LAB — HEPATIC FUNCTION PANEL
ALT: 28 U/L (ref 0–53)
AST: 32 U/L (ref 0–37)
Albumin: 4 g/dL (ref 3.5–5.2)
Alkaline Phosphatase: 71 U/L (ref 39–117)
Total Bilirubin: 1.1 mg/dL (ref 0.3–1.2)
Total Protein: 6.9 g/dL (ref 6.0–8.3)

## 2013-01-24 LAB — BASIC METABOLIC PANEL
Calcium: 9.4 mg/dL (ref 8.4–10.5)
Creatinine, Ser: 1.1 mg/dL (ref 0.4–1.5)
GFR: 91.46 mL/min (ref >60.00)
Glucose, Bld: 97 mg/dL (ref 70–99)
Sodium: 138 mEq/L (ref 135–145)

## 2013-01-24 LAB — CBC WITH DIFFERENTIAL/PLATELET
Basophils Absolute: 0 10*3/uL (ref 0.0–0.1)
Basophils Relative: 0.5 % (ref 0.0–3.0)
Eosinophils Absolute: 0.3 10*3/uL (ref 0.0–0.7)
Eosinophils Relative: 3.8 % (ref 0.0–5.0)
Hemoglobin: 16 g/dL (ref 13.0–17.0)
Lymphocytes Relative: 39.9 % (ref 12.0–46.0)
Lymphs Abs: 2.9 10*3/uL (ref 0.7–4.0)
MCHC: 34.1 g/dL (ref 30.0–36.0)
Monocytes Absolute: 0.6 10*3/uL (ref 0.1–1.0)
Neutro Abs: 3.5 10*3/uL (ref 1.4–7.7)
Neutrophils Relative %: 48 % (ref 43.0–77.0)
Platelets: 221 10*3/uL (ref 150.0–400.0)
RBC: 5.11 Mil/uL (ref 4.22–5.81)
WBC: 7.3 10*3/uL (ref 4.5–10.5)

## 2013-01-24 LAB — TSH: TSH: 0.54 u[IU]/mL (ref 0.35–5.50)

## 2013-01-24 NOTE — Patient Instructions (Addendum)
Please remember to go to the  Lab and x-ray department downstairs for your tests - we will call you with the results when they are available.  If you notice any decline in your exercise tolerance on the eliptical,  You  Will need a stress test and I prefer the CPST - call [facility] [phone] to schedule

## 2013-01-24 NOTE — Progress Notes (Signed)
Quick Note:  Spoke with pt and notified of results per Dr. Wert. Pt verbalized understanding and denied any questions.  ______ 

## 2013-01-24 NOTE — Progress Notes (Signed)
Subjective:    Patient ID: Philip Woodard, male    DOB: 1950-08-20 .   MRN: [id number]  Brief patient profile:  68 yobm never smoker with sarcoidosis dx by FOB 04/2000 rx x 3 months with prednisone initially not chronically steroid dep previously originally presented with cough /sob did not relapse off prednisone        History of Present Illness   January 25, 2009 3 month followup with PFT's and cxr. Pt states that his breathing has been okay. He only c/o dry "seasonal cough" x 3 months = tickle attributable to pnds. no rash or arthraligia or ocular co's. >>no changes     01/24/2013 f/u ov/Analie Katzman re: sarcodiosis Chief Complaint  Patient presents with  . Followup with PFT    Pt c/o increased DOE for the past 4 months- gets winded walking approx 500 ft and also when he walks up steps. He also c/o prod cough with very minimal yellow sputum x 3-4 months.    does eliptical ok  Three times weekly with no decline in aerobic capacity   No obvious day to day or daytime variabilty or assoc chronic cough or cp or chest tightness, subjective wheeze overt sinus or hb symptoms. No unusual exp hx or h/o childhood pna/ asthma or knowledge of premature birth.  Sleeping ok without nocturnal  or early am exacerbation  of respiratory  c/o's or need for noct saba. Also denies any obvious fluctuation of symptoms with weather or environmental changes or other aggravating or alleviating factors except as outlined above   Current Medications, Allergies, Complete Past Medical History, Past Surgical History, Family History, and Social History were reviewed in [employer] record.  ROS  The following are not active complaints unless bolded sore throat, dysphagia, dental problems, itching, sneezing,  nasal congestion or excess/ purulent secretions, ear ache,   fever, chills, sweats, unintended wt loss, pleuritic or exertional cp, hemoptysis,  orthopnea pnd or leg swelling, presyncope,  palpitations, heartburn, abdominal pain, anorexia, nausea, vomiting, diarrhea  or change in bowel or urinary habits, change in stools or urine, dysuria,hematuria,  rash, arthralgias, visual complaints, headache, numbness weakness or ataxia or problems with walking or coordination,  change in mood/affect or memory.           Past Medical History:  ROUTINE GENERAL MEDICAL EXAM@HEALTH  CARE FACL (ICD-V70.0).........Marland KitchenEllison  UNSPECIFIED GENITAL HERPES (ICD-054.10)  ALLERGIC RHINITIS (ICD-477.9)  SLEEP APNEA (ICD-780.57)  SARCOIDOSIS (ICD-135)....................................................................................Marland KitchenWert  - Dx by [facility]BBx 04/2000  - PFT's nl January 25, 2009  HYPOTHYROIDISM (ICD-244.9)  GOITER, MULTINODULAR (ICD-241.1)  HEMORRHOIDS (ICD-455.6)  HYPERGLYCEMIA (ICD-790.29)      Objective:   Physical Exam   01/24/2013     215  Wt Readings from Last 3 Encounters:  12/12/11 215 lb 9.6 oz (97.796 kg)  09/13/11 215 lb (97.523 kg)  06/27/11 211 lb 3.2 oz (95.8 kg)     GEN: A/Ox3; pleasant , NAD, well nourished   HEENT:  [place]/AT,  EACs-clear, TMs-wnl, NOSE-clear, THROAT-clear, no lesions, no postnasal drip or exudate noted.   NECK:  Supple w/ fair ROM; no JVD; normal carotid impulses w/o bruits; no thyromegaly - small marble sized rubbery submental node R neck, non tender, o/w no nodes  RESP  Clear  P & A; w/o, wheezes/ rales/ or rhonchi.no accessory muscle use, no dullness to percussion  CARD:  RRR, no m/r/g  , no peripheral edema, pulses intact, no cyanosis or clubbing.  GI:   Soft & nt; nml bowel  sounds; no organomegaly or masses detected.  Musco: Warm bil, no deformities or joint swelling noted.   Neuro: alert, no focal deficits noted.    Skin: no lesions      CXR  01/24/2013 :   Interval mild enlargement of the right hilum suggesting adenopathy.  Interim increase interstitial markings in the lung bases with mild  atelectasis. These changes could be  related to pneumonitis and the  patient's known sarcoid. My overview: no sign changes when accounting for difference in technique   Assessment & Plan:

## 2013-01-25 NOTE — Assessment & Plan Note (Addendum)
Unexplained by labs/ cxr/ pft's and not proportionate to ex as can do eliptical fine> rec he keep pushing aerobics and call for cpst if continue to notice disproportionate doe.  See instructions for specific recommendations which were reviewed directly with the patient who was given a copy with highlighter outlining the key components.

## 2013-01-25 NOTE — Assessment & Plan Note (Signed)
-   Dx by [facility]BBx 04/2000 rx x 3 months pred only   - PFT's nl January 25, 2009 -  PFT"s wnl 01/24/2013   Despite radiology's concern, there is no clinical evidence of active sarcoid - pft's look great, so will just have the cxr repeated in 6 months

## 2013-01-25 NOTE — Assessment & Plan Note (Signed)
Lab Results  Component Value Date   TSH 0.54 01/24/2013     Adequate control on present rx, reviewed > no change in rx needed

## 2013-01-31 ENCOUNTER — Ambulatory Visit (INDEPENDENT_AMBULATORY_CARE_PROVIDER_SITE_OTHER): Payer: Federal, State, Local not specified - PPO

## 2013-01-31 ENCOUNTER — Other Ambulatory Visit: Payer: Self-pay | Admitting: *Deleted

## 2013-01-31 ENCOUNTER — Encounter: Payer: Self-pay | Admitting: Endocrinology

## 2013-01-31 ENCOUNTER — Ambulatory Visit (INDEPENDENT_AMBULATORY_CARE_PROVIDER_SITE_OTHER): Payer: Federal, State, Local not specified - PPO | Admitting: Endocrinology

## 2013-01-31 VITALS — BP 140/70 | HR 80 | Temp 99.0°F | Resp 16 | Ht 67.5 in | Wt 213.1 lb

## 2013-01-31 DIAGNOSIS — Z0000 Encounter for general adult medical examination without abnormal findings: Secondary | ICD-10-CM | POA: Insufficient documentation

## 2013-01-31 DIAGNOSIS — Z23 Encounter for immunization: Secondary | ICD-10-CM

## 2013-01-31 DIAGNOSIS — B351 Tinea unguium: Secondary | ICD-10-CM

## 2013-01-31 DIAGNOSIS — R7309 Other abnormal glucose: Secondary | ICD-10-CM

## 2013-01-31 DIAGNOSIS — Z125 Encounter for screening for malignant neoplasm of prostate: Secondary | ICD-10-CM | POA: Insufficient documentation

## 2013-01-31 DIAGNOSIS — I10 Essential (primary) hypertension: Secondary | ICD-10-CM

## 2013-01-31 LAB — EKG 12-LEAD

## 2013-01-31 LAB — URINALYSIS, ROUTINE W REFLEX MICROSCOPIC
Nitrite: NEGATIVE
Specific Gravity, Urine: 1.02 (ref 1.000–1.030)
Urine Glucose: NEGATIVE
Urobilinogen, UA: 0.2 (ref 0.0–1.0)

## 2013-01-31 LAB — HEMOGLOBIN A1C: Hgb A1c MFr Bld: 5.9 % (ref 4.6–6.5)

## 2013-01-31 LAB — LIPID PANEL
Cholesterol: 129 mg/dL (ref 0–200)
LDL Cholesterol: 84 mg/dL (ref 0–99)

## 2013-01-31 LAB — PSA: PSA: 0.64 ng/mL (ref 0.10–4.00)

## 2013-01-31 MED ORDER — TERBINAFINE HCL 250 MG PO TABS: 250.0000 mg | ORAL_TABLET | Freq: Every day | ORAL | Status: DC

## 2013-01-31 MED ORDER — LEVOTHYROXINE SODIUM 175 MCG PO TABS: 175.0000 ug | ORAL_TABLET | Freq: Every day | ORAL | Status: DC

## 2013-01-31 NOTE — Progress Notes (Signed)
Subjective:    Patient ID: Philip Woodard, male    DOB: 17-Mar-1950, 62 y.o.   MRN: [id number]  HPI Pt is here for regular wellness examination, and is feeling pretty well in general, and says chronic med probs are stable, except as noted below Past Medical History  Diagnosis Date  . Routine general medical examination at a health care facility   . Genital herpes   . Allergic rhinitis   . Sleep apnea   . Sarcoidosis   . Hypothyroidism   . Multinodular goiter   . Hemorrhoids   . Hyperglycemia   . Hyperplastic lymph node 06/27/2011    Submental node excised 1/02; regrowth: re-excision 10/06  . Alkaline phosphatase raised 06/28/2011    Fluctuating - suspect due to sarcoidosis    No past surgical history on file.  History   Social History  . Marital Status: Married    Spouse Name: N/A    Number of Children: N/A  . Years of Education: N/A   Occupational History  . Not on file.   Social History Main Topics  . Smoking status: Never Smoker   . Smokeless tobacco: Not on file  . Alcohol Use: Yes     Comment: 3 times per month  . Drug Use: Not on file  . Sexual Activity: Not on file   Other Topics Concern  . Not on file   Social History Narrative  . No narrative on file    Current Outpatient Prescriptions on File Prior to Visit  Medication Sig Dispense Refill  . acyclovir (ZOVIRAX) 800 MG tablet TAKE 1 TABLET BY MOUTH EVERY DAY  90 tablet  3  . fluticasone (FLONASE) 50 MCG/ACT nasal spray Place 2 sprays into the nose daily.  16 g  6  . levothyroxine (SYNTHROID, LEVOTHROID) 175 MCG tablet Take 1 tablet (175 mcg total) by mouth daily.  30 tablet  4   No current facility-administered medications on file prior to visit.    Allergies  Allergen Reactions  . Other     SEAFOOD    Family History  Problem Relation Age of Onset  . Asthma Cousin   . Sarcoidosis Neg Hx     BP 140/70  Pulse 80  Temp(Src) 99 F (37.2 C) (Oral)  Resp 16  Ht 5' 7.5" (1.715 m)  Wt 213  lb 1.6 oz (96.662 kg)  BMI 32.86 kg/m2  Review of Systems  Constitutional: Negative for unexpected weight change.  HENT: Negative for hearing loss.   Eyes: Negative for visual disturbance.  Respiratory: Negative for shortness of breath.   Cardiovascular: Negative for chest pain.  Gastrointestinal: Negative for abdominal pain.  Endocrine: Negative for cold intolerance.  Genitourinary: Negative for hematuria and difficulty urinating.  Musculoskeletal: Negative for gait problem.  Allergic/Immunologic: Negative for environmental allergies.  Neurological: Negative for numbness.  Hematological: Does not bruise/bleed easily.  Psychiatric/Behavioral: Negative for dysphoric mood.       Objective:   Physical Exam VS: see vs page GEN: no distress HEAD: head: no deformity eyes: no periorbital swelling, no proptosis external nose and ears are normal mouth: no lesion seen NECK: a healed scar is present.  i do not appreciate a nodule in the thyroid or elsewhere in the neck CHEST WALL: no deformity LUNGS: clear to auscultation BREASTS:  No gynecomastia CV: reg rate and rhythm, no murmur ABD: abdomen is soft, nontender.  no hepatosplenomegaly.  not distended.  no hernia. RECTAL: normal external and internal exam.  heme neg.  PROSTATE:  Normal size.  No nodule MUSCULOSKELETAL: muscle bulk and strength are grossly normal.  no obvious joint swelling.  gait is normal and steady EXTEMITIES: no deformity.  no ulcer on the feet.  feet are of normal color and temp.  no edema PULSES: dorsalis pedis intact bilat.  no carotid bruit NEURO:  cn 2-12 grossly intact.   readily moves all 4's.  sensation is intact to touch on the feet SKIN:  Normal texture and temperature.  No rash or suspicious lesion is visible.   NODES:  None palpable at the neck PSYCH: alert, oriented x3.  Does not appear anxious nor depressed.    i reviewed electrocardiogram    Assessment & Plan:  Wellness visit today, with  problems stable, except as noted. we discussed code status.  pt requests full code, but would not want to be started or maintained on artificial life-support measures if there was not a reasonable chance of recovery      SEPARATE EVALUATION FOLLOWS--EACH PROBLEM HERE IS NEW, NOT RESPONDING TO TREATMENT, OR POSES SIGNIFICANT RISK TO THE PATIENT'S HEALTH: HISTORY OF THE PRESENT ILLNESS: Pt states few mos of moderate cracking of both great toenails, and assoc onycholysis Pt also has a small hyperpigmented nodule at the right upper arm. PAST MEDICAL HISTORY reviewed and up to date today REVIEW OF SYSTEMS: Denies fever and rash PHYSICAL EXAMINATION: VITAL SIGNS:  See vs page GENERAL: no distress Toes: there is bilateral onychomycosis, and onycholysis of the great toenails.   Skin: 5 mm hyperpigmented nodule at the right upper arm.   LABS: LFT=normal IMPRESSION: Onychomycosis, new Skin nodule: low-risk, new Risk of liver disease with lamisil, but LFT are normal today PLAN: See instruction page

## 2013-01-31 NOTE — Patient Instructions (Addendum)
please consider these measures for your health:  minimize alcohol.  do not use tobacco products.  have a colonoscopy at least every 10 years from age 60.  keep firearms safely stored.  always use seat belts.  have working smoke alarms in your home.  see an eye doctor and dentist regularly.  never drive under the influence of alcohol or drugs (including prescription drugs).   blood tests are being requested for you today.  We'll contact you with results. i have sent a prescription to your pharmacy, for toenail fungus. Please return in 1 year. Let's recheck the colonoscopy.  you will receive a phone call, about a day and time for an appointment. Please call if the area on your arm enlarges further.

## 2013-02-01 DIAGNOSIS — B351 Tinea unguium: Secondary | ICD-10-CM | POA: Insufficient documentation

## 2013-02-07 ENCOUNTER — Encounter: Payer: Self-pay | Admitting: Internal Medicine

## 2013-02-12 ENCOUNTER — Telehealth: Payer: Self-pay

## 2013-02-12 DIAGNOSIS — J309 Allergic rhinitis, unspecified: Secondary | ICD-10-CM

## 2013-02-12 NOTE — Telephone Encounter (Signed)
Patient ok with referral.

## 2013-02-12 NOTE — Telephone Encounter (Signed)
Patient called requesting a change in prescription. Patient is currently on Flonase and states that it is not helping with sinus.   Please advise,  Thanks!

## 2013-02-12 NOTE — Telephone Encounter (Signed)
The next step would be to see a specialist.  Ok to refer?

## 2013-02-21 NOTE — Telephone Encounter (Signed)
done

## 2013-03-01 ENCOUNTER — Other Ambulatory Visit: Payer: Self-pay | Admitting: Endocrinology

## 2013-04-04 ENCOUNTER — Ambulatory Visit (INDEPENDENT_AMBULATORY_CARE_PROVIDER_SITE_OTHER): Payer: Federal, State, Local not specified - PPO | Admitting: Internal Medicine

## 2013-04-04 ENCOUNTER — Encounter (INDEPENDENT_AMBULATORY_CARE_PROVIDER_SITE_OTHER): Payer: Self-pay

## 2013-04-04 ENCOUNTER — Encounter: Payer: Self-pay | Admitting: Internal Medicine

## 2013-04-04 ENCOUNTER — Ambulatory Visit: Payer: Federal, State, Local not specified - PPO

## 2013-04-04 VITALS — BP 136/80 | HR 82 | Ht 67.0 in | Wt 213.4 lb

## 2013-04-04 DIAGNOSIS — D869 Sarcoidosis, unspecified: Secondary | ICD-10-CM

## 2013-04-04 DIAGNOSIS — J309 Allergic rhinitis, unspecified: Secondary | ICD-10-CM

## 2013-04-04 DIAGNOSIS — G4733 Obstructive sleep apnea (adult) (pediatric): Secondary | ICD-10-CM

## 2013-04-04 NOTE — Progress Notes (Signed)
04/04/13- 57 yoM never smoker Referred by Dr. Loanne Drilling for allergy consult.  Pt c/o some SOB with exertion, sinus headaches, postnasal drip X5 years.  Followed by Dr Melvyn Novas for Sarcoid. Patient reports headache, perennial, intermittent, associated with sinus congestion. He was told these were from allergy over 5 years ago. Father had a history of allergic rhinitis. Spring and fall are worst. Congestion is bigger problem than itching or sneezing. Some benefit from Claritin D. And Flonase. No ENT surgery. No history of skin rash. Allergy skin testing 10 years ago-diagnosed allergic to seafood. He eats what he wants but pretreatment with Benadryl. Environment: House, crawlspace, gas heat, carpet, no pets, water damage/mold or smokers. Medical history of sarcoid Medical history of obstructive sleep apnea treated with CPAP from ENT with no followup  He would like some help with this. PFT 01/30/13- WNL (Dr Melvyn Novas) CXR 01/24/13 IMPRESSION:  Interval mild enlargement of the right hilum suggesting adenopathy.  Interim increase interstitial markings in the lung bases with mild  atelectasis. These changes could be related to pneumonitis and the  patient's known sarcoid.  Electronically Signed  By: Marcello Moores Register  On: 01/24/2013 11:09   Prior to Admission medications   Medication Sig Start Date End Date Taking? Authorizing Provider  acyclovir (ZOVIRAX) 800 MG tablet TAKE 1 TABLET BY MOUTH EVERY DAY 03/01/13  Yes Renato Shin, MD  levothyroxine (SYNTHROID, LEVOTHROID) 175 MCG tablet Take 1 tablet (175 mcg total) by mouth daily. 01/31/13  Yes Renato Shin, MD  terbinafine (LAMISIL) 250 MG tablet Take 1 tablet (250 mg total) by mouth daily. 01/31/13  Yes Renato Shin, MD   Past Medical History  Diagnosis Date  . Routine general medical examination at a health care facility   . Genital herpes   . Allergic rhinitis   . Sleep apnea   . Sarcoidosis   . Hypothyroidism   . Multinodular goiter   . Hemorrhoids    . Hyperglycemia   . Hyperplastic lymph node 06/27/2011    Submental node excised 1/02; regrowth: re-excision 10/06  . Alkaline phosphatase raised 06/28/2011    Fluctuating - suspect due to sarcoidosis   Past Surgical History  Procedure Laterality Date  . Thyroidectomy  2002  . Rotator cuff repair Bilateral [id number]   Family History  Problem Relation Age of Onset  . Asthma Cousin   . Sarcoidosis Neg Hx    History   Social History  . Marital Status: Married    Spouse Name: N/A    Number of Children: N/A  . Years of Education: N/A   Occupational History  . Not on file.   Social History Main Topics  . Smoking status: Never Smoker   . Smokeless tobacco: Never Used  . Alcohol Use: Yes     Comment: 3 times per month  . Drug Use: Not on file  . Sexual Activity: Not on file   Other Topics Concern  . Not on file   Social History Narrative  . No narrative on file   ROS-see HPI Constitutional:   No-   weight loss, night sweats, fevers, chills, fatigue, lassitude. HEENT:   No-  headaches, difficulty swallowing, tooth/dental problems, sore throat,       No-  sneezing, itching, ear ache, nasal congestion, post nasal drip,  CV:  No-   chest pain, orthopnea, PND, swelling in lower extremities, anasarca,  dizziness, palpitations Resp: No-   shortness of breath with exertion or at rest.              No-   productive cough,  + non-productive cough,  No- coughing up of blood.              No-   change in color of mucus.  No- wheezing.   Skin: No-   rash or lesions. GI:  No-   heartburn, +indigestion, no-abdominal pain, nausea, vomiting, diarrhea,                 change in bowel habits, loss of appetite GU: No-   dysuria, change in color of urine, no urgency or frequency.  No- flank pain. MS:  No-   joint pain or swelling.  No- decreased range of motion.  No- back pain. Neuro-     nothing unusual Psych:  No- change in mood or affect. No depression or  anxiety.  No memory loss.  OBJ- Physical Exam General- Alert, Oriented, Affect-appropriate, Distress- none acute Skin- rash-none, lesions- none, excoriation- none Lymphadenopathy- none Head- atraumatic            Eyes- Gross vision intact, PERRLA, conjunctivae and secretions clear            Ears- Hearing, canals-normal            Nose- Clear, no-Septal dev, mucus, polyps, erosion, perforation             Throat- Mallampati II , mucosa clear , drainage- none, tonsils- atrophic Neck- flexible , trachea midline, no stridor , thyroid nl, carotid no bruit Chest - symmetrical excursion , unlabored           Heart/CV- RRR , no murmur , no gallop  , no rub, nl s1 s2                           - JVD- none , edema- none, stasis changes- none, varices- none           Lung- clear to P&A, wheeze- none, cough- none , dullness-none, rub- none           Chest wall-  Abd- tender-no, distended-no, bowel sounds-present, HSM- no Br/ Gen/ Rectal- Not done, not indicated Extrem- cyanosis- none, clubbing, none, atrophy- none, strength- nl Neuro- grossly intact to observation

## 2013-04-04 NOTE — Patient Instructions (Signed)
Order- Allergy Profile, Food IgE profile   Order- [facility]- Continue CPAP at current pressure, mask of choice, supplies, humidifier        Dx OSA             Our staff please ask [facility] what is the listed CPAP pressure for our records ?

## 2013-04-07 LAB — ALLERGEN FOOD PROFILE SPECIFIC IGE
Apple: <0.1 kU/L
Chicken IgE: <0.1 kU/L
Egg White IgE: <0.1 kU/L
Fish Cod: <0.1 kU/L
IgE (Immunoglobulin E), Serum: 1.7 IU/mL (ref 0.0–180.0)
Milk IgE: <0.1 kU/L
Orange: <0.1 kU/L
Soybean IgE: <0.1 kU/L
Tuna IgE: <0.1 kU/L

## 2013-04-07 LAB — ALLERGY FULL PROFILE
Allergen,Goose feathers, e70: <0.1 kU/L
Alternaria Alternata: <0.1 kU/L
Aspergillus fumigatus, m3: <0.1 kU/L
Bermuda Grass: <0.1 kU/L
Box Elder IgE: <0.1 kU/L
Candida Albicans: <0.1 kU/L
Cat Dander: <0.1 kU/L
Curvularia lunata: <0.1 kU/L
Dog Dander: <0.1 kU/L
Elm IgE: <0.1 kU/L
Fescue: <0.1 kU/L
G009 Red Top: <0.1 kU/L
Goldenrod: <0.1 kU/L
Helminthosporium halodes: <0.1 kU/L
Lamb's Quarters: <0.1 kU/L
Plantain: <0.1 kU/L
Stemphylium Botryosum: <0.1 kU/L

## 2013-05-02 ENCOUNTER — Encounter: Payer: Self-pay | Admitting: Internal Medicine

## 2013-05-02 DIAGNOSIS — Z9989 Dependence on other enabling machines and devices: Secondary | ICD-10-CM | POA: Insufficient documentation

## 2013-05-02 DIAGNOSIS — G4733 Obstructive sleep apnea (adult) (pediatric): Secondary | ICD-10-CM | POA: Insufficient documentation

## 2013-05-02 NOTE — Assessment & Plan Note (Signed)
Followed by Dr. Melvyn Novas. Consider that rhinitis might come from sarcoid.

## 2013-05-02 NOTE — Assessment & Plan Note (Signed)
Plan-DME/[facility] to provide CPAP settings. Prescription for supplies, download etc

## 2013-05-03 NOTE — Assessment & Plan Note (Signed)
He reports allergy testing years ago, remembered as positive only for foods. Consider possible sarcoid rhinitis Plan-allergy profile

## 2013-05-16 ENCOUNTER — Ambulatory Visit (INDEPENDENT_AMBULATORY_CARE_PROVIDER_SITE_OTHER): Payer: Federal, State, Local not specified - PPO | Admitting: Internal Medicine

## 2013-05-16 ENCOUNTER — Encounter (INDEPENDENT_AMBULATORY_CARE_PROVIDER_SITE_OTHER): Payer: Self-pay

## 2013-05-16 ENCOUNTER — Encounter: Payer: Self-pay | Admitting: Internal Medicine

## 2013-05-16 VITALS — BP 130/74 | HR 71 | Ht 67.0 in | Wt 211.0 lb

## 2013-05-16 DIAGNOSIS — J310 Chronic rhinitis: Secondary | ICD-10-CM

## 2013-05-16 NOTE — Progress Notes (Signed)
04/04/13- 37 yoM never smoker Referred by Dr. Loanne Drilling for allergy consult.  Pt c/o some SOB with exertion, sinus headaches, postnasal drip X5 years.  Followed by Dr Melvyn Novas for Sarcoid. Patient reports headache, perennial, intermittent, associated with sinus congestion. He was told these were from allergy over 5 years ago. Father had a history of allergic rhinitis. Spring and fall are worst. Congestion is bigger problem than itching or sneezing. Some benefit from Claritin D. And Flonase. No ENT surgery. No history of skin rash. Allergy skin testing 10 years ago-diagnosed allergic to seafood. He eats what he wants but pretreatment with Benadryl. Environment: House, crawlspace, gas heat, carpet, no pets, water damage/mold or smokers. Medical history of sarcoid Medical history of obstructive sleep apnea treated with CPAP from ENT with no followup  He would like some help with this. PFT 01/30/13- WNL (Dr Melvyn Novas) CXR 01/24/13 IMPRESSION:  Interval mild enlargement of the right hilum suggesting adenopathy.  Interim increase interstitial markings in the lung bases with mild  atelectasis. These changes could be related to pneumonitis and the  patient's known sarcoid.  Electronically Signed  By: Marcello Moores Register  On: 01/24/2013 11:09   05/16/13- 41 yoM never smoker Referred by Dr. Loanne Drilling for allergy consult.  Pt c/o some SOB with exertion, sinus headaches, postnasal drip X5 years.  Followed by Dr Melvyn Novas for Sarcoid. FOLLOWS FOR:  Still having sinus pressure and congestion with PND Frontal Headache, nasal congestion vary. Ok right now.  Allergy profile 04/04/13- Negative- Total IgE 1.7, No elevations on broad environmental profile or Food profile.  Hx OSA managed elsewhere.  ROS-see HPI Constitutional:   No-   weight loss, night sweats, fevers, chills, fatigue, lassitude. HEENT:   No-  headaches, difficulty swallowing, tooth/dental problems, sore throat,       No-  sneezing, itching, ear ache, nasal  congestion, post nasal drip,  CV:  No-   chest pain, orthopnea, PND, swelling in lower extremities, anasarca, dizziness, palpitations Resp: No-   shortness of breath with exertion or at rest.              No-   productive cough,  + non-productive cough,  No- coughing up of blood.              No-   change in color of mucus.  No- wheezing.   Skin: No-   rash or lesions. GI:  No-   heartburn, +indigestion, no-abdominal pain, nausea, vomiting,  GU:  MS:  No-   joint pain or swelling.   Neuro-     nothing unusual Psych:  No- change in mood or affect. No depression or anxiety.  No memory loss.  OBJ- Physical Exam General- Alert, Oriented, Affect-appropriate, Distress- none acute Skin- rash-none, lesions- none, excoriation- none Lymphadenopathy- none Head- atraumatic            Eyes- Gross vision intact, PERRLA, conjunctivae and secretions clear            Ears- Hearing, canals-normal            Nose-  +turbinate edema, no-Septal dev, mucus+bridging, No-polyps, erosion, perforation             Throat- Mallampati II , mucosa clear , drainage- none, tonsils- atrophic Neck- flexible , trachea midline, no stridor , thyroid nl, carotid no bruit Chest - symmetrical excursion , unlabored           Heart/CV- RRR , no murmur , no gallop  , no rub, nl s1  s2                           - JVD- none , edema- none, stasis changes- none, varices- none           Lung- clear to P&A, wheeze- none, cough- none , dullness-none, rub- none           Chest wall-  Abd-  Br/ Gen/ Rectal- Not done, not indicated Extrem- cyanosis- none, clubbing, none, atrophy- none, strength- nl Neuro- grossly intact to observation

## 2013-05-16 NOTE — Patient Instructions (Signed)
This appears to be a non-allergic rhinitis, possibly related to your sarcoid  Try using otc nasal steroid spray- Flonase/ fluticasone, or Nasacort      1-2 puffs each nostril, every night at bedtime while needed  Ask [facility] to check your CPAP machine, and ask them what the pressure setting is. We can help you with it if needed.  Follow up with Dr Melvyn Novas for your sarcoid as directed, or sooner if you have a concern.

## 2013-05-27 ENCOUNTER — Encounter: Payer: Self-pay | Admitting: Internal Medicine

## 2013-06-03 ENCOUNTER — Encounter: Payer: Self-pay | Admitting: Internal Medicine

## 2013-06-03 NOTE — Assessment & Plan Note (Signed)
This is not an allergic rhinitis. Consider vasomotor or possibly sarcoid rhinitis. Some of the headache may be muscle tension. Plan- try otc steroid nasal spray- fluticasone or nasacort

## 2013-06-09 ENCOUNTER — Encounter: Payer: Self-pay | Admitting: Endocrinology

## 2013-06-09 ENCOUNTER — Ambulatory Visit (INDEPENDENT_AMBULATORY_CARE_PROVIDER_SITE_OTHER): Payer: Federal, State, Local not specified - PPO | Admitting: Endocrinology

## 2013-06-09 VITALS — BP 140/72 | HR 67 | Temp 97.9°F | Ht 67.0 in | Wt 213.0 lb

## 2013-06-09 DIAGNOSIS — L568 Other specified acute skin changes due to ultraviolet radiation: Secondary | ICD-10-CM

## 2013-06-09 MED ORDER — METHYLPREDNISOLONE (PAK) 4 MG PO TABS: ORAL_TABLET | ORAL | Status: DC

## 2013-06-09 NOTE — Progress Notes (Signed)
   Subjective:    Patient ID: Philip Woodard, male    DOB: 05/11/1950, 63 y.o.   MRN: [id number]  HPI Pt states a few days of slight swelling of the skin of the head and neck, and assoc itching.  He feels this is related to being in the sun.  This has happened on 2 other occasions--each after sun exposure.   Past Medical History  Diagnosis Date  . Routine general medical examination at a health care facility   . Genital herpes   . Allergic rhinitis   . Sleep apnea   . Sarcoidosis   . Hypothyroidism   . Multinodular goiter   . Hemorrhoids   . Hyperglycemia   . Hyperplastic lymph node 06/27/2011    Submental node excised 1/02; regrowth: re-excision 10/06  . Alkaline phosphatase raised 06/28/2011    Fluctuating - suspect due to sarcoidosis    Past Surgical History  Procedure Laterality Date  . Thyroidectomy  2002  . Rotator cuff repair Bilateral [id number]    History   Social History  . Marital Status: Married    Spouse Name: N/A    Number of Children: N/A  . Years of Education: N/A   Occupational History  . Not on file.   Social History Main Topics  . Smoking status: Never Smoker   . Smokeless tobacco: Never Used  . Alcohol Use: Yes     Comment: 3 times per month  . Drug Use: Not on file  . Sexual Activity: Not on file   Other Topics Concern  . Not on file   Social History Narrative  . No narrative on file    Current Outpatient Prescriptions on File Prior to Visit  Medication Sig Dispense Refill  . acyclovir (ZOVIRAX) 800 MG tablet TAKE 1 TABLET BY MOUTH EVERY DAY  90 tablet  0  . levothyroxine (SYNTHROID, LEVOTHROID) 175 MCG tablet Take 1 tablet (175 mcg total) by mouth daily.  30 tablet  4   No current facility-administered medications on file prior to visit.    Allergies  Allergen Reactions  . Other     SEAFOOD    Family History  Problem Relation Age of Onset  . Asthma Cousin   . Sarcoidosis Neg Hx     BP 140/72  Pulse 67  Temp(Src) 97.9 F  (36.6 C) (Oral)  Ht 5' 7"  (1.702 m)  Wt 213 lb (96.616 kg)  BMI 33.35 kg/m2  SpO2 95%   Review of Systems He has a slight rash on the head and neck.     Objective:   Physical Exam VITAL SIGNS:  See vs page. GENERAL: no distress.   Skin; mild erythema of the head and neck.        Assessment & Plan:  Photodermatitis, recurrent

## 2013-06-09 NOTE — Patient Instructions (Addendum)
i have sent a prescription to your pharmacy, for a steroid "pack."   I hope you feel better soon.  If you don't feel better in the next few days, please call back.

## 2013-07-03 ENCOUNTER — Other Ambulatory Visit: Payer: Self-pay | Admitting: Endocrinology

## 2013-08-03 ENCOUNTER — Other Ambulatory Visit: Payer: Self-pay | Admitting: Endocrinology

## 2013-08-31 ENCOUNTER — Other Ambulatory Visit: Payer: Self-pay | Admitting: Endocrinology

## 2013-09-17 ENCOUNTER — Other Ambulatory Visit: Payer: Self-pay | Admitting: Endocrinology

## 2013-10-04 ENCOUNTER — Other Ambulatory Visit: Payer: Self-pay | Admitting: Endocrinology

## 2013-11-04 ENCOUNTER — Other Ambulatory Visit: Payer: Self-pay | Admitting: Endocrinology

## 2013-12-04 ENCOUNTER — Ambulatory Visit: Payer: Federal, State, Local not specified - PPO | Admitting: Endocrinology

## 2013-12-10 ENCOUNTER — Ambulatory Visit (INDEPENDENT_AMBULATORY_CARE_PROVIDER_SITE_OTHER): Payer: Federal, State, Local not specified - PPO | Admitting: Endocrinology

## 2013-12-10 ENCOUNTER — Encounter: Payer: Self-pay | Admitting: Endocrinology

## 2013-12-10 VITALS — BP 124/60 | HR 76 | Temp 98.2°F | Ht 67.0 in | Wt 208.0 lb

## 2013-12-10 DIAGNOSIS — N401 Enlarged prostate with lower urinary tract symptoms: Secondary | ICD-10-CM

## 2013-12-10 DIAGNOSIS — R748 Abnormal levels of other serum enzymes: Secondary | ICD-10-CM

## 2013-12-10 DIAGNOSIS — R7309 Other abnormal glucose: Secondary | ICD-10-CM

## 2013-12-10 DIAGNOSIS — E890 Postprocedural hypothyroidism: Secondary | ICD-10-CM

## 2013-12-10 DIAGNOSIS — G4730 Sleep apnea, unspecified: Secondary | ICD-10-CM

## 2013-12-10 DIAGNOSIS — I10 Essential (primary) hypertension: Secondary | ICD-10-CM

## 2013-12-10 DIAGNOSIS — Z23 Encounter for immunization: Secondary | ICD-10-CM

## 2013-12-10 DIAGNOSIS — Z125 Encounter for screening for malignant neoplasm of prostate: Secondary | ICD-10-CM

## 2013-12-10 DIAGNOSIS — M2550 Pain in unspecified joint: Secondary | ICD-10-CM | POA: Insufficient documentation

## 2013-12-10 DIAGNOSIS — E042 Nontoxic multinodular goiter: Secondary | ICD-10-CM

## 2013-12-10 LAB — LIPID PANEL
CHOL/HDL RATIO: 3.2 ratio
Cholesterol: 142 mg/dL (ref 0–200)
HDL: 45 mg/dL (ref >39)
LDL Cholesterol: 80 mg/dL (ref 0–99)
Triglycerides: 86 mg/dL (ref <150)
VLDL: 17 mg/dL (ref 0–40)

## 2013-12-10 LAB — CBC WITH DIFFERENTIAL/PLATELET
BASOS PCT: 0 % (ref 0–1)
Basophils Absolute: 0 10*3/uL (ref 0.0–0.1)
EOS ABS: 0.3 10*3/uL (ref 0.0–0.7)
Eosinophils Relative: 4 % (ref 0–5)
HCT: 44.1 % (ref 39.0–52.0)
Hemoglobin: 15.6 g/dL (ref 13.0–17.0)
Lymphocytes Relative: 38 % (ref 12–46)
Lymphs Abs: 2.7 10*3/uL (ref 0.7–4.0)
MCH: 30.4 pg (ref 26.0–34.0)
MCHC: 35.4 g/dL (ref 30.0–36.0)
MCV: 86 fL (ref 78.0–100.0)
Monocytes Absolute: 0.5 10*3/uL (ref 0.1–1.0)
Monocytes Relative: 7 % (ref 3–12)
NEUTROS PCT: 51 % (ref 43–77)
Neutro Abs: 3.6 10*3/uL (ref 1.7–7.7)
Platelets: 239 10*3/uL (ref 150–400)
RBC: 5.13 MIL/uL (ref 4.22–5.81)
RDW: 14.7 % (ref 11.5–15.5)
WBC: 7.1 10*3/uL (ref 4.0–10.5)

## 2013-12-10 LAB — HEPATIC FUNCTION PANEL
ALBUMIN: 4 g/dL (ref 3.5–5.2)
ALT: 21 U/L (ref 0–53)
AST: 25 U/L (ref 0–37)
Alkaline Phosphatase: 84 U/L (ref 39–117)
Bilirubin, Direct: 0.2 mg/dL (ref 0.0–0.3)
Indirect Bilirubin: 0.7 mg/dL (ref 0.2–1.2)
TOTAL PROTEIN: 6.5 g/dL (ref 6.0–8.3)
Total Bilirubin: 0.9 mg/dL (ref 0.2–1.2)

## 2013-12-10 LAB — BASIC METABOLIC PANEL
BUN: 18 mg/dL (ref 6–23)
CO2: 25 mEq/L (ref 19–32)
Calcium: 9.3 mg/dL (ref 8.4–10.5)
Chloride: 105 mEq/L (ref 96–112)
Creat: 1.34 mg/dL (ref 0.50–1.35)
Glucose, Bld: 85 mg/dL (ref 70–99)
POTASSIUM: 4.4 meq/L (ref 3.5–5.3)
SODIUM: 142 meq/L (ref 135–145)

## 2013-12-10 LAB — SEDIMENTATION RATE: SED RATE: 1 mm/h (ref 0–16)

## 2013-12-10 LAB — URIC ACID: URIC ACID, SERUM: 5.7 mg/dL (ref 4.0–7.8)

## 2013-12-10 LAB — HEMOGLOBIN A1C
HEMOGLOBIN A1C: 5.8 % — AB (ref <5.7)
Mean Plasma Glucose: 120 mg/dL — ABNORMAL HIGH (ref <117)

## 2013-12-10 LAB — TSH: TSH: 0.485 u[IU]/mL (ref 0.350–4.500)

## 2013-12-10 MED ORDER — ACYCLOVIR 800 MG PO TABS: ORAL_TABLET | ORAL | Status: DC

## 2013-12-10 MED ORDER — LEVOTHYROXINE SODIUM 175 MCG PO TABS: ORAL_TABLET | ORAL | Status: DC

## 2013-12-10 NOTE — Progress Notes (Signed)
Subjective:    Patient ID: Philip Woodard, male    DOB: Aug 16, 1950, 63 y.o.   MRN: [id number]  HPI Pt reports few months of slight pain at the knees, but no assoc numbness.  Past Medical History  Diagnosis Date  . Routine general medical examination at a health care facility   . Genital herpes   . Allergic rhinitis   . Sleep apnea   . Sarcoidosis   . Hypothyroidism   . Multinodular goiter   . Hemorrhoids   . Hyperglycemia   . Hyperplastic lymph node 06/27/2011    Submental node excised 1/02; regrowth: re-excision 10/06  . Alkaline phosphatase raised 06/28/2011    Fluctuating - suspect due to sarcoidosis    Past Surgical History  Procedure Laterality Date  . Thyroidectomy  2002  . Rotator cuff repair Bilateral [id number]    History   Social History  . Marital Status: Married    Spouse Name: N/A    Number of Children: N/A  . Years of Education: N/A   Occupational History  . Not on file.   Social History Main Topics  . Smoking status: Never Smoker   . Smokeless tobacco: Never Used  . Alcohol Use: Yes     Comment: 3 times per month  . Drug Use: Not on file  . Sexual Activity: Not on file   Other Topics Concern  . Not on file   Social History Narrative  . No narrative on file    No current outpatient prescriptions on file prior to visit.   No current facility-administered medications on file prior to visit.    Allergies  Allergen Reactions  . Other     SEAFOOD    Family History  Problem Relation Age of Onset  . Asthma Cousin   . Sarcoidosis Neg Hx     BP 124/60  Pulse 76  Temp(Src) 98.2 F (36.8 C) (Oral)  Ht 5' 7"  (1.702 m)  Wt 208 lb (94.348 kg)  BMI 32.57 kg/m2  SpO2 94%   Review of Systems Denies neck pain, edema, sob, weight change, and chest pain    Objective:   Physical Exam VITAL SIGNS:  See vs page GENERAL: no distress Neck: a healed scar is present.  i do not appreciate a nodule in the thyroid or elsewhere in the  neck LUNGS:  Clear to auscultation HEART:  Regular rate and rhythm without murmurs noted. Normal S1,S2.   Knees: normal to my exam Gait: normal and steady  Lab Results  Component Value Date   WBC 7.1 12/10/2013   HGB 15.6 12/10/2013   HCT 44.1 12/10/2013   PLT 239 12/10/2013   GLUCOSE 85 12/10/2013   CHOL 142 12/10/2013   TRIG 86 12/10/2013   HDL 45 12/10/2013   LDLCALC 80 12/10/2013   ALT 21 12/10/2013   AST 25 12/10/2013   NA 142 12/10/2013   K 4.4 12/10/2013   CL 105 12/10/2013   CREATININE 1.34 12/10/2013   BUN 18 12/10/2013   CO2 25 12/10/2013   TSH 0.485 12/10/2013   PSA 0.85 12/10/2013   INR 1.04 02/18/2009   HGBA1C 5.8* 12/10/2013       Assessment & Plan:  Knee pain, new, uncertain etiology Hyperglycemia, stable Dyslipidemia: stable.  Patient is advised the following: Patient Instructions  Please come back for a regular physical appointment after 01/31/14. blood tests are being requested for you today.  We'll contact you with results. Please see a specialist for your  knees.  you will receive a phone call, about a day and time for an appointment

## 2013-12-10 NOTE — Patient Instructions (Addendum)
Please come back for a regular physical appointment after 01/31/14. blood tests are being requested for you today.  We'll contact you with results. Please see a specialist for your knees.  you will receive a phone call, about a day and time for an appointment

## 2013-12-11 LAB — URINALYSIS, ROUTINE W REFLEX MICROSCOPIC
BILIRUBIN URINE: NEGATIVE
GLUCOSE, UA: NEGATIVE mg/dL
HGB URINE DIPSTICK: NEGATIVE
Ketones, ur: NEGATIVE mg/dL
Leukocytes, UA: NEGATIVE
Nitrite: NEGATIVE
PROTEIN: NEGATIVE mg/dL
Specific Gravity, Urine: 1.015 (ref 1.005–1.030)
Urobilinogen, UA: 1 mg/dL (ref 0.0–1.0)
pH: 8 (ref 5.0–8.0)

## 2013-12-11 LAB — PSA: PSA: 0.85 ng/mL (ref <=4.00)

## 2014-01-08 ENCOUNTER — Other Ambulatory Visit: Payer: Self-pay | Admitting: Endocrinology

## 2014-02-08 ENCOUNTER — Other Ambulatory Visit: Payer: Self-pay | Admitting: Endocrinology

## 2014-02-10 ENCOUNTER — Encounter: Payer: Federal, State, Local not specified - PPO | Admitting: Endocrinology

## 2014-02-19 ENCOUNTER — Ambulatory Visit (INDEPENDENT_AMBULATORY_CARE_PROVIDER_SITE_OTHER): Payer: Federal, State, Local not specified - PPO | Admitting: Endocrinology

## 2014-02-19 ENCOUNTER — Encounter: Payer: Self-pay | Admitting: Endocrinology

## 2014-02-19 VITALS — BP 130/80 | HR 75 | Temp 98.7°F | Ht 67.0 in | Wt 211.0 lb

## 2014-02-19 DIAGNOSIS — M5137 Other intervertebral disc degeneration, lumbosacral region: Secondary | ICD-10-CM

## 2014-02-19 DIAGNOSIS — H9190 Unspecified hearing loss, unspecified ear: Secondary | ICD-10-CM

## 2014-02-19 DIAGNOSIS — Z0000 Encounter for general adult medical examination without abnormal findings: Secondary | ICD-10-CM

## 2014-02-19 NOTE — Patient Instructions (Addendum)
please consider these measures for your health:  minimize alcohol.  do not use tobacco products.  have a colonoscopy at least every 10 years from age 63.  keep firearms safely stored.  always use seat belts.  have working smoke alarms in your home.  see an eye doctor and dentist regularly.  never drive under the influence of alcohol or drugs (including prescription drugs).   Please return in 1 year.  Please see hearing and back specialists.  you will receive a phone call, about days and times for an appointments

## 2014-02-19 NOTE — Progress Notes (Signed)
Subjective:    Patient ID: Philip Woodard, male    DOB: 1950-03-20, 63 y.o.   MRN: [id number]  HPI Pt is here for regular wellness examination, and is feeling pretty well in general, and says chronic med probs are stable, except as noted below.   Past Medical History  Diagnosis Date  . Routine general medical examination at a health care facility   . Genital herpes   . Allergic rhinitis   . Sleep apnea   . Sarcoidosis   . Hypothyroidism   . Multinodular goiter   . Hemorrhoids   . Hyperglycemia   . Hyperplastic lymph node 06/27/2011    Submental node excised 1/02; regrowth: re-excision 10/06  . Alkaline phosphatase raised 06/28/2011    Fluctuating - suspect due to sarcoidosis    Past Surgical History  Procedure Laterality Date  . Thyroidectomy  2002  . Rotator cuff repair Bilateral [id number]    History   Social History  . Marital Status: Married    Spouse Name: N/A    Number of Children: N/A  . Years of Education: N/A   Occupational History  . Not on file.   Social History Main Topics  . Smoking status: Never Smoker   . Smokeless tobacco: Never Used  . Alcohol Use: Yes     Comment: 3 times per month  . Drug Use: Not on file  . Sexual Activity: Not on file   Other Topics Concern  . Not on file   Social History Narrative    Current Outpatient Prescriptions on File Prior to Visit  Medication Sig Dispense Refill  . acyclovir (ZOVIRAX) 800 MG tablet TAKE 1 TABLET BY MOUTH DAILY 90 tablet 3  . levothyroxine (SYNTHROID, LEVOTHROID) 175 MCG tablet TAKE 1 TABLET BY MOUTH DAILY 30 tablet 0   No current facility-administered medications on file prior to visit.    Allergies  Allergen Reactions  . Other     SEAFOOD    Family History  Problem Relation Age of Onset  . Asthma Cousin   . Sarcoidosis Neg Hx     BP 130/80 mmHg  Pulse 75  Temp(Src) 98.7 F (37.1 C) (Oral)  Ht 5' 7"  (1.702 m)  Wt 211 lb (95.709 kg)  BMI 33.04 kg/m2  SpO2 95%  Review of  Systems  Constitutional: Negative for fever and unexpected weight change.  HENT: Negative for ear discharge.   Eyes: Negative for visual disturbance.  Respiratory: Negative for shortness of breath.   Cardiovascular: Negative for chest pain.  Gastrointestinal: Negative for blood in stool.  Endocrine: Negative for cold intolerance.  Genitourinary: Negative for hematuria and difficulty urinating.  Musculoskeletal: Negative for gait problem.  Skin: Negative for rash.  Allergic/Immunologic: Negative for immunocompromised state.  Neurological: Negative for numbness.  Hematological: Does not bruise/bleed easily.  Psychiatric/Behavioral: Negative for dysphoric mood.       Objective:   Physical Exam VS: see vs page GEN: no distress HEAD: head: no deformity eyes: no periorbital swelling, no proptosis external nose and ears are normal mouth: no lesion seen NECK: a healed scar is present.  i do not appreciate a nodule in the thyroid or elsewhere in the neck CHEST WALL: no deformity LUNGS: clear to auscultation BREASTS:  No gynecomastia CV: reg rate and rhythm, no murmur ABD: abdomen is soft, nontender.  no hepatosplenomegaly.  not distended.  no hernia RECTAL: normal external and internal exam.  heme neg. PROSTATE:  Normal size.  No nodule MUSCULOSKELETAL: muscle bulk  and strength are grossly normal.  no obvious joint swelling.  gait is normal and steady EXTEMITIES: no deformity.  no ulcer on the feet.  feet are of normal color and temp.  no edema PULSES: dorsalis pedis intact bilat.  no carotid bruit NEURO:  cn 2-12 grossly intact.   readily moves all 4's.  sensation is intact to touch on the feet SKIN:  Normal texture and temperature.  No rash or suspicious lesion is visible.   NODES:  None palpable at the neck PSYCH: alert, well-oriented.  Does not appear anxious nor depressed.       Assessment & Plan:  Wellness visit today, with problems stable, except as noted.    SEPARATE  EVALUATION FOLLOWS--EACH PROBLEM HERE IS NEW, NOT RESPONDING TO TREATMENT, OR POSES SIGNIFICANT RISK TO THE PATIENT'S HEALTH: HISTORY OF THE PRESENT ILLNESS: Pt states few mos of slight hearing loss from both ears, but no assoc pain.  PAST MEDICAL HISTORY reviewed and up to date today REVIEW OF SYSTEMS: Denies nasal congestion.  Back pain has recurred PHYSICAL EXAMINATION: VITAL SIGNS:  See vs page GENERAL: no distress Both eac's and tm's are normal IMPRESSION: Hearing loss, new Low-back pain, recurrent PLAN:  Ref sports med and audiol

## 2014-03-08 ENCOUNTER — Other Ambulatory Visit: Payer: Self-pay | Admitting: Endocrinology

## 2014-03-09 ENCOUNTER — Other Ambulatory Visit: Payer: Self-pay | Admitting: *Deleted

## 2014-03-09 MED ORDER — LEVOTHYROXINE SODIUM 175 MCG PO TABS: 175.0000 ug | ORAL_TABLET | Freq: Every day | ORAL | Status: DC

## 2014-03-20 ENCOUNTER — Ambulatory Visit: Payer: Federal, State, Local not specified - PPO | Admitting: Family Medicine

## 2014-03-30 ENCOUNTER — Ambulatory Visit: Payer: Federal, State, Local not specified - PPO | Admitting: Family Medicine

## 2014-05-27 ENCOUNTER — Ambulatory Visit (INDEPENDENT_AMBULATORY_CARE_PROVIDER_SITE_OTHER): Payer: Federal, State, Local not specified - PPO | Admitting: Endocrinology

## 2014-05-27 ENCOUNTER — Encounter: Payer: Self-pay | Admitting: Endocrinology

## 2014-05-27 VITALS — BP 140/74 | HR 83 | Temp 98.2°F | Ht 68.0 in | Wt 219.0 lb

## 2014-05-27 DIAGNOSIS — J069 Acute upper respiratory infection, unspecified: Secondary | ICD-10-CM

## 2014-05-27 MED ORDER — CEFACLOR 250 MG PO CAPS
250.0000 mg | ORAL_CAPSULE | Freq: Three times a day (TID) | ORAL | Status: DC
Start: 2014-05-27 — End: 2015-02-22

## 2014-05-27 MED ORDER — BENZONATATE 100 MG PO CAPS: 100.0000 mg | ORAL_CAPSULE | Freq: Three times a day (TID) | ORAL | Status: DC | PRN

## 2014-05-27 MED ORDER — FLUTICASONE-SALMETEROL 100-50 MCG/DOSE IN AEPB: 1.0000 | INHALATION_SPRAY | Freq: Two times a day (BID) | RESPIRATORY_TRACT | Status: DC

## 2014-05-27 NOTE — Progress Notes (Signed)
   Subjective:    Patient ID: Philip Woodard, male    DOB: October 06, 1950, 63 y.o.   MRN: [id number]  HPI Pt states few days of slight dry-quality cough in the chest, and assoc headache. Past Medical History  Diagnosis Date  . Routine general medical examination at a health care facility   . Genital herpes   . Allergic rhinitis   . Sleep apnea   . Sarcoidosis   . Hypothyroidism   . Multinodular goiter   . Hemorrhoids   . Hyperglycemia   . Hyperplastic lymph node 06/27/2011    Submental node excised 1/02; regrowth: re-excision 10/06  . Alkaline phosphatase raised 06/28/2011    Fluctuating - suspect due to sarcoidosis    Past Surgical History  Procedure Laterality Date  . Thyroidectomy  2002  . Rotator cuff repair Bilateral [id number]    History   Social History  . Marital Status: Married    Spouse Name: N/A  . Number of Children: N/A  . Years of Education: N/A   Occupational History  . Not on file.   Social History Main Topics  . Smoking status: Never Smoker   . Smokeless tobacco: Never Used  . Alcohol Use: Yes     Comment: 3 times per month  . Drug Use: Not on file  . Sexual Activity: Not on file   Other Topics Concern  . Not on file   Social History Narrative    Current Outpatient Prescriptions on File Prior to Visit  Medication Sig Dispense Refill  . acyclovir (ZOVIRAX) 800 MG tablet TAKE 1 TABLET BY MOUTH DAILY 90 tablet 3  . levothyroxine (SYNTHROID, LEVOTHROID) 175 MCG tablet Take 1 tablet (175 mcg total) by mouth daily. 30 tablet 2   No current facility-administered medications on file prior to visit.    Allergies  Allergen Reactions  . Other     SEAFOOD    Family History  Problem Relation Age of Onset  . Asthma Cousin   . Sarcoidosis Neg Hx     BP 140/74 mmHg  Pulse 83  Temp(Src) 98.2 F (36.8 C) (Oral)  Ht 5' 8"  (1.727 m)  Wt 219 lb (99.338 kg)  BMI 33.31 kg/m2  SpO2 92%  Review of Systems He has nasal congestion and left earache.   Denies fever.  He has slight wheezing.      Objective:   Physical Exam VITAL SIGNS:  See vs page GENERAL: no distress head: no deformity eyes: no periorbital swelling, no proptosis external nose and ears are normal mouth: no lesion seen Both tm's are red LUNGS:  Clear to auscultation      Assessment & Plan:  URI: new  Patient is advised the following: Patient Instructions  i have sent 2 prescription to your pharmacy: for an antibiotic pill, inhaler, and non-drowsy cough pills. Loratadine-d (non-prescription) will help your congestion.  I hope you feel better soon.  If you don't feel better by next week, please call back.  Please call sooner if you get worse.

## 2014-05-27 NOTE — Patient Instructions (Addendum)
i have sent 2 prescription to your pharmacy: for an antibiotic pill, inhaler, and non-drowsy cough pills. Loratadine-d (non-prescription) will help your congestion.  I hope you feel better soon.  If you don't feel better by next week, please call back.  Please call sooner if you get worse.

## 2014-06-09 ENCOUNTER — Other Ambulatory Visit: Payer: Self-pay | Admitting: Endocrinology

## 2014-07-08 ENCOUNTER — Other Ambulatory Visit: Payer: Self-pay | Admitting: Endocrinology

## 2014-07-20 ENCOUNTER — Encounter: Payer: Self-pay | Admitting: Family Medicine

## 2014-07-20 ENCOUNTER — Ambulatory Visit (INDEPENDENT_AMBULATORY_CARE_PROVIDER_SITE_OTHER)
Admission: RE | Admit: 2014-07-20 | Discharge: 2014-07-20 | Disposition: A | Discharge status: Home / Self Care | Payer: Federal, State, Local not specified - PPO | Source: Ambulatory Visit | Attending: Family Medicine | Admitting: Family Medicine

## 2014-07-20 ENCOUNTER — Ambulatory Visit (INDEPENDENT_AMBULATORY_CARE_PROVIDER_SITE_OTHER): Payer: Federal, State, Local not specified - PPO | Admitting: Family Medicine

## 2014-07-20 VITALS — BP 132/72 | HR 83 | Wt 224.0 lb

## 2014-07-20 DIAGNOSIS — M129 Arthropathy, unspecified: Secondary | ICD-10-CM

## 2014-07-20 DIAGNOSIS — M5137 Other intervertebral disc degeneration, lumbosacral region: Secondary | ICD-10-CM

## 2014-07-20 DIAGNOSIS — M25561 Pain in right knee: Secondary | ICD-10-CM | POA: Diagnosis not present

## 2014-07-20 DIAGNOSIS — M5416 Radiculopathy, lumbar region: Secondary | ICD-10-CM

## 2014-07-20 DIAGNOSIS — IMO0002 Reserved for concepts with insufficient information to code with codable children: Secondary | ICD-10-CM | POA: Insufficient documentation

## 2014-07-20 DIAGNOSIS — M2142 Flat foot [pes planus] (acquired), left foot: Secondary | ICD-10-CM

## 2014-07-20 DIAGNOSIS — M2141 Flat foot [pes planus] (acquired), right foot: Secondary | ICD-10-CM | POA: Insufficient documentation

## 2014-07-20 MED ORDER — DICLOFENAC SODIUM 2 % TD SOLN
TRANSDERMAL | Status: DC
Start: 2014-07-20 — End: 2015-02-22

## 2014-07-20 NOTE — Assessment & Plan Note (Signed)
Repeat x-rays given today. Patient did work with Product/process development scientist today to learn different exercises for more core strengthening and hip abductor strengthening. We did discuss the patient's flat feet could also be playing a role. Patient will try some over-the-counter natural supplementations this low down the progression of arthritis. Patient will come back in 3-4 weeks for further evaluation and treatment and will come back sooner if he has any radicular symptoms.

## 2014-07-20 NOTE — Progress Notes (Signed)
[name] [place], [place] [address] Phone: [phone] Subjective:    I'm seeing this patient by the request  of:  Renato Shin, MD   CC: Lower back pain, right knee.  [id number] Philip Woodard is a 64 y.o. male coming in with complaint of low back pain. Patient does have a past medical history significant for sarcoidosis.    Regarding patient's back pain he did have an injury when he was 64 years of age. Patient denies needing any true surgery. Patient had seen another provider but was doing very well with core strengthening. Patient states now that he is 20 years later from last time seen anybody is starting have some mild discomfort. Nothing that stops him from activities and no radicular symptoms. Patient rates the severity is 4 out of 10. Denies any fevers, chills, or any abnormal weight loss. Patient has noticed though if he gains weight it seems to be worse. Describes it as a dull ache.  Patient is also describing some right knee pain. Discuss it is more of a dull throbbing aching sensation more on the medial aspect of the knee. Worse when he is doing activity and better with rest. Patient states that he notices different shoes can also be beneficial or harmful. Patient denies any radiation, denies any instability. States more of a sharp pain that is followed by a dull ache. Only happens occasionally and no instability. No injury noted.      Past Medical History  Diagnosis Date  . Routine general medical examination at a health care facility   . Genital herpes   . Allergic rhinitis   . Sleep apnea   . Sarcoidosis   . Hypothyroidism   . Multinodular goiter   . Hemorrhoids   . Hyperglycemia   . Hyperplastic lymph node 06/27/2011    Submental node excised 1/02; regrowth: re-excision 10/06  . Alkaline phosphatase raised 06/28/2011    Fluctuating - suspect due to sarcoidosis   Past Surgical History  Procedure Laterality Date  .  Thyroidectomy  2002  . Rotator cuff repair Bilateral [id number]   History  Substance Use Topics  . Smoking status: Never Smoker   . Smokeless tobacco: Never Used  . Alcohol Use: Yes     Comment: 3 times per month   Family History  Problem Relation Age of Onset  . Asthma Cousin   . Sarcoidosis Neg Hx    Allergies  Allergen Reactions  . Other     SEAFOOD     Past medical history, social, surgical and family history all reviewed in electronic medical record.   Review of Systems: No headache, visual changes, nausea, vomiting, diarrhea, constipation, dizziness, abdominal pain, skin rash, fevers, chills, night sweats, weight loss, swollen lymph nodes, body aches, joint swelling, muscle aches, chest pain, shortness of breath, mood changes.   Objective Blood pressure 132/72, pulse 83, weight 224 lb (101.606 kg), SpO2 96 %.  General: No apparent distress alert and oriented x3 mood and affect normal, dressed appropriately.  HEENT: Pupils equal, extraocular movements intact  Respiratory: Patient's speak in full sentences and does not appear short of breath  Cardiovascular: No lower extremity edema, non tender, no erythema  Skin: Warm dry intact with no signs of infection or rash on extremities or on axial skeleton.  Abdomen: Soft nontender  Neuro: Cranial nerves II through XII are intact, neurovascularly intact in all extremities with 2+ DTRs and 2+ pulses.  Lymph: No lymphadenopathy  of posterior or anterior cervical chain or axillae bilaterally.  Gait normal with good balance and coordination.  MSK:  Non tender with full range of motion and good stability and symmetric strength and tone of shoulders, elbows, wrist, hip, and ankles bilaterally.   Foot exam shows the patient does have severe pes planus bilaterally with overpronation the hindfoot. Back Exam:  Inspection: Unremarkable  Motion: Flexion 30 deg, Extension 25 deg, Side Bending to 25 deg bilaterally,  Rotation to 35 deg  bilaterally  SLR laying: Negative  XSLR laying: Negative  Palpable tenderness: Moderate tenderness of the paraspinal musculature bilaterally FABER: Tightness bilaterally Sensory change: Gross sensation intact to all lumbar and sacral dermatomes.  Reflexes: 2+ at both patellar tendons, 2+ at achilles tendons, Babinski's downgoing.  Strength at foot  Plantar-flexion: 5/5 Dorsi-flexion: 5/5 Eversion: 5/5 Inversion: 5/5  Leg strength  Quad: 5/5 Hamstring: 5/5 Hip flexor: 5/5 Hip abductors: 4/5  Gait unremarkable.  Knee: Right Mild valgus deformity Tender to palpation over the medial joint line ROM full in flexion and extension and lower leg rotation. Ligaments with solid consistent endpoints including ACL, PCL, LCL, MCL. Negative Mcmurray's, Apley's, and Thessalonian tests. Mild painful patellar compression. Patellar glide with moderate crepitus. Patellar and quadriceps tendons unremarkable. Hamstring and quadriceps strength is normal.  Contralateral knee has mild medial joint line tenderness otherwise unremarkable.  [id number]; 15 minutes spent for Therapeutic exercises as stated in above notes.  This included exercises focusing on stretching, strengthening, with significant focus on eccentric aspects. Low back exercises that included:  Pelvic tilt/bracing instruction to focus on control of the pelvic girdle and lower abdominal muscles  Glute strengthening exercises, focusing on proper firing of the glutes without engaging the low back muscles Proper stretching techniques for maximum relief for the hamstrings, hip flexors, low back and some rotation where tolerated  Proper technique shown and discussed handout in great detail with ATC.  All questions were discussed and answered.     Impression and Recommendations:     This case required medical decision making of moderate complexity.

## 2014-07-20 NOTE — Progress Notes (Signed)
Pre visit review using our clinic review tool, if applicable. No additional management support is needed unless otherwise documented below in the visit note. 

## 2014-07-20 NOTE — Patient Instructions (Addendum)
Good to see you.  xrays downstairs today.  Ice 20 minutes 2 times daily. Usually after activity and before bed. Exercises 3 times a week. Alternate knee and and back.  pennsaid pinkie amount topically 2 times daily as needed.  Take tylenol 650 mg three times a day is the best evidence based medicine we have for arthritis.  Glucosamine sulfate156m a day is a supplement that has been shown to help moderate to severe arthritis. Vitamin D 2000 IU daily Fish oil 2 grams daily.  Tumeric 5060mtwice daily.  Capsaicin topically up to four times a day may also help with pain. Cortisone injections are an option if these interventions do not seem to make a difference or need more relief.  If cortisone injections do not help, there are different types of shots that may help but they take longer to take effect.  We can discuss this at follow up.  It's important that you continue to stay active. Controlling your weight is important.  Consider physical therapy to strengthen muscles around the joint that hurts to take pressure off of the joint itself. Shoe inserts with good arch support may be helpful.  [employer] orthotics at [employer] could help.  Good shoes with rigid bottom.  [employer]rell or New balance greater then [employer] cycling with low resistance are the best two types of exercise for arthritis. Come back and see me in 3-4 weeks.

## 2014-07-20 NOTE — Assessment & Plan Note (Signed)
No some more moderate arthritis of the knee. We discussed icing regimen and home exercises. We discussed bracing and patient was given a medial unloader brace. Patient work with Product/process development scientist today. I do think that patient will try some over-the-counter natural supplementations as well. Patient will limit the vitamin D supplementation secondary to his history of sarcoid. Patient and will come back and see me again in 3 weeks to see how he is responding. If continuing have pain we'll consider injection.

## 2014-08-07 ENCOUNTER — Other Ambulatory Visit: Payer: Self-pay | Admitting: Endocrinology

## 2014-08-11 ENCOUNTER — Ambulatory Visit: Payer: Federal, State, Local not specified - PPO | Admitting: Family Medicine

## 2014-08-11 DIAGNOSIS — Z0289 Encounter for other administrative examinations: Secondary | ICD-10-CM

## 2014-09-08 ENCOUNTER — Ambulatory Visit (INDEPENDENT_AMBULATORY_CARE_PROVIDER_SITE_OTHER): Payer: Federal, State, Local not specified - PPO | Admitting: Family Medicine

## 2014-09-08 ENCOUNTER — Encounter: Payer: Self-pay | Admitting: Family Medicine

## 2014-09-08 VITALS — BP 138/82 | HR 74 | Ht 68.0 in | Wt 223.0 lb

## 2014-09-08 DIAGNOSIS — M129 Arthropathy, unspecified: Secondary | ICD-10-CM

## 2014-09-08 DIAGNOSIS — IMO0002 Reserved for concepts with insufficient information to code with codable children: Secondary | ICD-10-CM

## 2014-09-08 DIAGNOSIS — M5137 Other intervertebral disc degeneration, lumbosacral region: Secondary | ICD-10-CM

## 2014-09-08 NOTE — Progress Notes (Signed)
[facility] [place], [place] [address] Phone: [phone] Subjective:    I'm seeing this patient by the request  of:  Renato Shin, MD   CC: Lower back pain, right knee.  [id number] Philip Woodard is a 64 y.o. male coming in with complaint of low back pain. Patient does have a past medical history significant for sarcoidosis.    Regarding patient's back pain he did have an injury when he was 63 years of age. Patient was seen previously and was given a series of exercises, over-the-counter natural supple mentation's, icing protocol. Patient also had x-rays that did show severe osteophytic changes mostly at L4-L5. Patient states that overall this is only moderately improved. Patient has been doing the range of motion. This though is not stopping him from any activities. Has noticed that maybe some mild improvement with the natural supplementation. No radiation down the legs and no numbness or tingling.  Patient is also describing some right knee pain. Patient was found to have moderate osteophytic changes. Patient was given a medial unloader, topical anti-inflammatory's, icing protocol. Patient states his knee is approximately 50% better. States that the brace is very helpful. We discussed icing which is been doing really regular as well. Denies any locking, clicking or giving out on him. Able to do all activities of daily living.      Past Medical History  Diagnosis Date  . Routine general medical examination at a health care facility   . Genital herpes   . Allergic rhinitis   . Sleep apnea   . Sarcoidosis   . Hypothyroidism   . Multinodular goiter   . Hemorrhoids   . Hyperglycemia   . Hyperplastic lymph node 06/27/2011    Submental node excised 1/02; regrowth: re-excision 10/06  . Alkaline phosphatase raised 06/28/2011    Fluctuating - suspect due to sarcoidosis   Past Surgical History  Procedure Laterality Date  . Thyroidectomy   2002  . Rotator cuff repair Bilateral [id number]   History  Substance Use Topics  . Smoking status: Never Smoker   . Smokeless tobacco: Never Used  . Alcohol Use: Yes     Comment: 3 times per month   Family History  Problem Relation Age of Onset  . Asthma Cousin   . Sarcoidosis Neg Hx    Allergies  Allergen Reactions  . Other     SEAFOOD     Past medical history, social, surgical and family history all reviewed in electronic medical record.   Review of Systems: No headache, visual changes, nausea, vomiting, diarrhea, constipation, dizziness, abdominal pain, skin rash, fevers, chills, night sweats, weight loss, swollen lymph nodes, body aches, joint swelling, muscle aches, chest pain, shortness of breath, mood changes.   Objective Blood pressure 138/82, pulse 74, height 5' 8"  (1.727 m), weight 223 lb (101.152 kg), SpO2 97 %.  General: No apparent distress alert and oriented x3 mood and affect normal, dressed appropriately.  HEENT: Pupils equal, extraocular movements intact  Respiratory: Patient's speak in full sentences and does not appear short of breath  Cardiovascular: No lower extremity edema, non tender, no erythema  Skin: Warm dry intact with no signs of infection or rash on extremities or on axial skeleton.  Abdomen: Soft nontender  Neuro: Cranial nerves II through XII are intact, neurovascularly intact in all extremities with 2+ DTRs and 2+ pulses.  Lymph: No lymphadenopathy of posterior or anterior cervical chain or axillae bilaterally.  Gait normal with  good balance and coordination.  MSK:  Non tender with full range of motion and good stability and symmetric strength and tone of shoulders, elbows, wrist, hip, and ankles bilaterally.   Foot exam shows the patient does have severe pes planus bilaterally with overpronation the hindfoot. Back Exam:  Inspection: Unremarkable  Motion: Flexion 30 deg, Extension 25 deg, Side Bending to 25 deg bilaterally,  Rotation to 35  deg bilaterally  SLR laying: Negative  XSLR laying: Negative  Palpable tenderness: Moderate tenderness of the paraspinal musculature bilaterally  FABER: Continued tightness but improved Sensory change: Gross sensation intact to all lumbar and sacral dermatomes.  Reflexes: 2+ at both patellar tendons, 2+ at achilles tendons, Babinski's downgoing.  Strength at foot  Plantar-flexion: 5/5 Dorsi-flexion: 5/5 Eversion: 5/5 Inversion: 5/5  Leg strength  Quad: 5/5 Hamstring: 5/5 Hip flexor: 5/5 Hip abductors: 4/5  Gait unremarkable.  Knee: Right Mild valgus deformity Tender to palpation over the medial joint line may be mildly less than last time ROM full in flexion and extension and lower leg rotation. Ligaments with solid consistent endpoints including ACL, PCL, LCL, MCL. Negative Mcmurray's, Apley's, and Thessalonian tests. Mild painful patellar compression. Patellar glide with moderate crepitus. Patellar and quadriceps tendons unremarkable. Hamstring and quadriceps strength is normal.  Contralateral knee has mild medial joint line tenderness otherwise unremarkable. No significant change from previous exam     Impression and Recommendations:     This case required medical decision making of moderate complexity.

## 2014-09-08 NOTE — Assessment & Plan Note (Signed)
She does have moderate arthritis of the knee. Seems to be doing well with conservative therapy. We will not make any drastic changes in care and patient will continue with the medial unloader brace. If patient has any worsening symptoms I would consider injection. Patient given topical anti-inflammatory's and prescription given. Patient and will come back and see me again in 4-6 weeks for further evaluation.

## 2014-09-08 NOTE — Assessment & Plan Note (Signed)
I do believe the patient has localized arthritis from the disc disease. L4-L5 seems to be the worsening area. We discussed home exercises, icing protocol and patient will call if any radicular symptoms occurs. We discussed the possibility of formal physical therapy which patient declined. Patient does go to the gym about 5 times a week and we discussed different core exercises that could be beneficial. Patient will try to make these changes and come back again in 6 weeks for further evaluation and treatment.  Spent  25 minutes with patient face-to-face and had greater than 50% of counseling including as described above in assessment and plan.

## 2014-09-08 NOTE — Progress Notes (Signed)
Pre visit review using our clinic review tool, if applicable. No additional management support is needed unless otherwise documented below in the visit note. 

## 2014-09-08 NOTE — Patient Instructions (Addendum)
Good to see you Ice is your friend Continue the exercises Biking or swimming could be great Continue the brace on the knee and do exercises on both sides pennsaid pinkie amount topically 2 times daily as needed. Send me a message if you like it.  Continue the vitamins See me again in 6 weeks.

## 2014-09-11 ENCOUNTER — Other Ambulatory Visit: Payer: Self-pay | Admitting: Endocrinology

## 2014-10-09 ENCOUNTER — Other Ambulatory Visit: Payer: Self-pay | Admitting: Endocrinology

## 2014-10-20 ENCOUNTER — Ambulatory Visit (INDEPENDENT_AMBULATORY_CARE_PROVIDER_SITE_OTHER): Payer: Federal, State, Local not specified - PPO | Admitting: Family Medicine

## 2014-10-20 ENCOUNTER — Encounter: Payer: Self-pay | Admitting: Family Medicine

## 2014-10-20 VITALS — BP 130/70 | HR 71 | Ht 68.0 in | Wt 222.0 lb

## 2014-10-20 DIAGNOSIS — M5137 Other intervertebral disc degeneration, lumbosacral region: Secondary | ICD-10-CM

## 2014-10-20 DIAGNOSIS — M129 Arthropathy, unspecified: Secondary | ICD-10-CM

## 2014-10-20 DIAGNOSIS — IMO0002 Reserved for concepts with insufficient information to code with codable children: Secondary | ICD-10-CM

## 2014-10-20 NOTE — Patient Instructions (Signed)
Good to see you Ice is your friend Conitnue the brace when you need it.  Stay active Continue the tylenol  Naproxen when need it.  See me again when you need me.

## 2014-10-20 NOTE — Assessment & Plan Note (Signed)
Patient is doing much better at this time. Encourage him to continue conservative therapy and wear the brace when needed. If patient has any worsening symptoms we can try an injection which patient has not had. In addition of this patient would be a candidate for viscous supplementation if needed and we can still do formal physical therapy. Patient though as long as he continues to do well we'll follow-up with me as needed.

## 2014-10-20 NOTE — Progress Notes (Signed)
Pre visit review using our clinic review tool, if applicable. No additional management support is needed unless otherwise documented below in the visit note. 

## 2014-10-20 NOTE — Assessment & Plan Note (Signed)
Patient overall is doing relatively well with the amount of osteophytic changes of the back. Continue exercises and icing and nutritional supplements.  Patient will continue to remain active. Patient has any worsening symptoms he'll follow-up as needed.

## 2014-10-20 NOTE — Progress Notes (Signed)
[name] [place], [place] [address] Phone: [phone] Subjective:    I'm seeing this patient by the request  of:  Renato Shin, MD   CC: Lower back pain, right knee.  [id number] Philip Woodard is a 64 y.o. male coming in with complaint of low back pain. Patient does have a past medical history significant for sarcoidosis.    Regarding patient's back pain he did have an injury when he was 64 years of age. Patient was seen previously and was given a series of exercises, over-the-counter natural supple mentation's, icing protocol. Patient also had x-rays that did show severe osteophytic changes mostly at L4-L5. Patient states that his back is doing fair. Patient denies any radiation down the legs. Nothing that his stopping him from activity. Patient continues the Tylenol on a very regular basis. Patient is wondering what he can use for breakthrough pain. Denies any new symptoms.  Patient is also describing some right knee pain. Patient was found to have moderate osteophytic changes. Patient states that he is approximately 80% better. Patient states he wears the brace when he is doing a lot of activity and does not wear it every day now. Patient continues with the topical anti-inflammatory's which he finds this helpful. Patient remains active and continues to work out most days of the week. No locking or instability that he can note at this time.      Past Medical History  Diagnosis Date  . Routine general medical examination at a health care facility   . Genital herpes   . Allergic rhinitis   . Sleep apnea   . Sarcoidosis   . Hypothyroidism   . Multinodular goiter   . Hemorrhoids   . Hyperglycemia   . Hyperplastic lymph node 06/27/2011    Submental node excised 1/02; regrowth: re-excision 10/06  . Alkaline phosphatase raised 06/28/2011    Fluctuating - suspect due to sarcoidosis   Past Surgical History  Procedure Laterality Date  .  Thyroidectomy  2002  . Rotator cuff repair Bilateral [id number]   History  Substance Use Topics  . Smoking status: Never Smoker   . Smokeless tobacco: Never Used  . Alcohol Use: Yes     Comment: 3 times per month   Family History  Problem Relation Age of Onset  . Asthma Cousin   . Sarcoidosis Neg Hx    Allergies  Allergen Reactions  . Other     SEAFOOD     Past medical history, social, surgical and family history all reviewed in electronic medical record.   Review of Systems: No headache, visual changes, nausea, vomiting, diarrhea, constipation, dizziness, abdominal pain, skin rash, fevers, chills, night sweats, weight loss, swollen lymph nodes, body aches, joint swelling, muscle aches, chest pain, shortness of breath, mood changes.   Objective Blood pressure 130/70, pulse 71, height 5' 8"  (1.727 m), weight 222 lb (100.699 kg), SpO2 97 %.  General: No apparent distress alert and oriented x3 mood and affect normal, dressed appropriately.  HEENT: Pupils equal, extraocular movements intact  Respiratory: Patient's speak in full sentences and does not appear short of breath  Cardiovascular: No lower extremity edema, non tender, no erythema  Skin: Warm dry intact with no signs of infection or rash on extremities or on axial skeleton.  Abdomen: Soft nontender  Neuro: Cranial nerves II through XII are intact, neurovascularly intact in all extremities with 2+ DTRs and 2+ pulses.  Lymph: No lymphadenopathy of posterior or  anterior cervical chain or axillae bilaterally.  Gait normal with good balance and coordination.  MSK:  Non tender with full range of motion and good stability and symmetric strength and tone of shoulders, elbows, wrist, hip, and ankles bilaterally.   Foot exam shows the patient does have severe pes planus bilaterally with overpronation the hindfoot. Back Exam:  Inspection: Unremarkable  Motion: Flexion 30 deg, Extension 25 deg, Side Bending to 25 deg bilaterally,    Rotation to 35 deg bilaterally  SLR laying: Negative  XSLR laying: Negative  Palpable tenderness: Moderate tenderness of the paraspinal musculature bilaterally  FABER: Continued tightness but improved Sensory change: Gross sensation intact to all lumbar and sacral dermatomes.  Reflexes: 2+ at both patellar tendons, 2+ at achilles tendons, Babinski's downgoing.  Strength at foot  Plantar-flexion: 5/5 Dorsi-flexion: 5/5 Eversion: 5/5 Inversion: 5/5  Leg strength  Quad: 5/5 Hamstring: 5/5 Hip flexor: 5/5 Hip abductors: 4/5  Gait unremarkable.  Knee: Right Mild valgus deformity Minimal tenderness over the medial joint line ROM full in flexion and extension and lower leg rotation. Ligaments with solid consistent endpoints including ACL, PCL, LCL, MCL. Negative Mcmurray's, Apley's, and Thessalonian tests. Mild painful patellar compression. Patellar glide with moderate crepitus. Patellar and quadriceps tendons unremarkable. Hamstring and quadriceps strength is normal.  Contralateral knee has mild medial joint line tenderness otherwise unremarkable. No significant change from previous exam     Impression and Recommendations:     This case required medical decision making of moderate complexity.

## 2014-10-21 ENCOUNTER — Encounter: Payer: Self-pay | Admitting: Family Medicine

## 2014-11-23 ENCOUNTER — Encounter: Payer: Self-pay | Admitting: Endocrinology

## 2014-11-25 ENCOUNTER — Other Ambulatory Visit: Payer: Self-pay | Admitting: Endocrinology

## 2014-11-25 DIAGNOSIS — G4733 Obstructive sleep apnea (adult) (pediatric): Secondary | ICD-10-CM

## 2014-11-26 NOTE — Telephone Encounter (Signed)
Pt returning Annasha's call

## 2015-01-13 ENCOUNTER — Other Ambulatory Visit: Payer: Self-pay | Admitting: Internal Medicine

## 2015-02-09 ENCOUNTER — Other Ambulatory Visit: Payer: Self-pay | Admitting: Endocrinology

## 2015-02-22 ENCOUNTER — Encounter: Payer: Self-pay | Admitting: Endocrinology

## 2015-02-22 ENCOUNTER — Ambulatory Visit (INDEPENDENT_AMBULATORY_CARE_PROVIDER_SITE_OTHER): Payer: Federal, State, Local not specified - PPO | Admitting: Endocrinology

## 2015-02-22 VITALS — BP 136/84 | HR 94 | Temp 98.0°F | Ht 68.0 in | Wt 222.0 lb

## 2015-02-22 DIAGNOSIS — R635 Abnormal weight gain: Secondary | ICD-10-CM

## 2015-02-22 DIAGNOSIS — E890 Postprocedural hypothyroidism: Secondary | ICD-10-CM | POA: Diagnosis not present

## 2015-02-22 DIAGNOSIS — R1310 Dysphagia, unspecified: Secondary | ICD-10-CM

## 2015-02-22 DIAGNOSIS — Z0000 Encounter for general adult medical examination without abnormal findings: Secondary | ICD-10-CM

## 2015-02-22 DIAGNOSIS — R7309 Other abnormal glucose: Secondary | ICD-10-CM | POA: Diagnosis not present

## 2015-02-22 DIAGNOSIS — K7581 Nonalcoholic steatohepatitis (NASH): Secondary | ICD-10-CM

## 2015-02-22 DIAGNOSIS — Z125 Encounter for screening for malignant neoplasm of prostate: Secondary | ICD-10-CM

## 2015-02-22 DIAGNOSIS — D869 Sarcoidosis, unspecified: Secondary | ICD-10-CM

## 2015-02-22 LAB — URINALYSIS, ROUTINE W REFLEX MICROSCOPIC
BILIRUBIN URINE: NEGATIVE
Hgb urine dipstick: NEGATIVE
Ketones, ur: NEGATIVE
LEUKOCYTES UA: NEGATIVE
NITRITE: NEGATIVE
RBC / HPF: NONE SEEN (ref 0–?)
Specific Gravity, Urine: 1.015 (ref 1.000–1.030)
Total Protein, Urine: NEGATIVE
UROBILINOGEN UA: 0.2 (ref 0.0–1.0)
Urine Glucose: NEGATIVE
WBC UA: NONE SEEN (ref 0–?)
pH: 7.5 (ref 5.0–8.0)

## 2015-02-22 LAB — HEPATIC FUNCTION PANEL
ALBUMIN: 4.1 g/dL (ref 3.5–5.2)
ALK PHOS: 69 U/L (ref 39–117)
ALT: 28 U/L (ref 0–53)
AST: 39 U/L — ABNORMAL HIGH (ref 0–37)
Bilirubin, Direct: 0.2 mg/dL (ref 0.0–0.3)
Total Bilirubin: 1 mg/dL (ref 0.2–1.2)
Total Protein: 6.4 g/dL (ref 6.0–8.3)

## 2015-02-22 LAB — LIPID PANEL
CHOL/HDL RATIO: 3
CHOLESTEROL: 134 mg/dL (ref 0–200)
HDL: 43.1 mg/dL (ref >39.00)
LDL Cholesterol: 74 mg/dL (ref 0–99)
NONHDL: 90.84
Triglycerides: 83 mg/dL (ref 0.0–149.0)
VLDL: 16.6 mg/dL (ref 0.0–40.0)

## 2015-02-22 LAB — TSH: TSH: 1.3 u[IU]/mL (ref 0.35–4.50)

## 2015-02-22 LAB — CBC WITH DIFFERENTIAL/PLATELET
BASOS ABS: 0 10*3/uL (ref 0.0–0.1)
Basophils Relative: 0.6 % (ref 0.0–3.0)
EOS ABS: 0.3 10*3/uL (ref 0.0–0.7)
Eosinophils Relative: 4.3 % (ref 0.0–5.0)
HEMATOCRIT: 47.2 % (ref 39.0–52.0)
HEMOGLOBIN: 15.8 g/dL (ref 13.0–17.0)
LYMPHS PCT: 40.6 % (ref 12.0–46.0)
Lymphs Abs: 3 10*3/uL (ref 0.7–4.0)
MCHC: 33.5 g/dL (ref 30.0–36.0)
MCV: 92.2 fl (ref 78.0–100.0)
MONOS PCT: 9.2 % (ref 3.0–12.0)
Monocytes Absolute: 0.7 10*3/uL (ref 0.1–1.0)
NEUTROS ABS: 3.4 10*3/uL (ref 1.4–7.7)
Neutrophils Relative %: 45.3 % (ref 43.0–77.0)
Platelets: 220 10*3/uL (ref 150.0–400.0)
RBC: 5.12 Mil/uL (ref 4.22–5.81)
RDW: 14.5 % (ref 11.5–15.5)
WBC: 7.5 10*3/uL (ref 4.0–10.5)

## 2015-02-22 LAB — BASIC METABOLIC PANEL
BUN: 13 mg/dL (ref 6–23)
CALCIUM: 9.5 mg/dL (ref 8.4–10.5)
CHLORIDE: 106 meq/L (ref 96–112)
CO2: 30 mEq/L (ref 19–32)
CREATININE: 1.09 mg/dL (ref 0.40–1.50)
GFR: 87.95 mL/min (ref >60.00)
Glucose, Bld: 98 mg/dL (ref 70–99)
Potassium: 4 mEq/L (ref 3.5–5.1)
SODIUM: 142 meq/L (ref 135–145)

## 2015-02-22 LAB — HEMOGLOBIN A1C: HEMOGLOBIN A1C: 5.6 % (ref 4.6–6.5)

## 2015-02-22 LAB — PSA: PSA: 0.64 ng/mL (ref 0.10–4.00)

## 2015-02-22 MED ORDER — OMEPRAZOLE 40 MG PO CPDR
40.0000 mg | DELAYED_RELEASE_CAPSULE | Freq: Every day | ORAL | Status: DC
Start: 2015-02-22 — End: 2016-09-07

## 2015-02-22 NOTE — Progress Notes (Signed)
Subjective:    Patient ID: Philip Woodard, male    DOB: 10/27/1950, 64 y.o.   MRN: [id number]  HPI Pt is here for regular wellness examination, and is feeling pretty well in general, and says chronic med probs are stable, except as noted below Past Medical History  Diagnosis Date  . Routine general medical examination at a health care facility   . Genital herpes   . Allergic rhinitis   . Sleep apnea   . Sarcoidosis ([place])   . Hypothyroidism   . Multinodular goiter   . Hemorrhoids   . Hyperglycemia   . Hyperplastic lymph node 06/27/2011    Submental node excised 1/02; regrowth: re-excision 10/06  . Alkaline phosphatase raised 06/28/2011    Fluctuating - suspect due to sarcoidosis    Past Surgical History  Procedure Laterality Date  . Thyroidectomy  2002  . Rotator cuff repair Bilateral 2005,2007    Social History   Social History  . Marital Status: Married    Spouse Name: N/A  . Number of Children: N/A  . Years of Education: N/A   Occupational History  . Not on file.   Social History Main Topics  . Smoking status: Never Smoker   . Smokeless tobacco: Never Used  . Alcohol Use: Yes     Comment: 3 times per month  . Drug Use: Not on file  . Sexual Activity: Not on file   Other Topics Concern  . Not on file   Social History Narrative    Current Outpatient Prescriptions on File Prior to Visit  Medication Sig Dispense Refill  . acyclovir (ZOVIRAX) 800 MG tablet TAKE 1 TABLET BY MOUTH DAILY 90 tablet 3  . levothyroxine (SYNTHROID, LEVOTHROID) 175 MCG tablet TAKE 1 TABLET BY MOUTH DAILY 30 tablet 0   No current facility-administered medications on file prior to visit.    Allergies  Allergen Reactions  . Other     SEAFOOD    Family History  Problem Relation Age of Onset  . Asthma Cousin   . Sarcoidosis Neg Hx     BP 136/84 mmHg  Pulse 94  Temp(Src) 98 F (36.7 C) (Oral)  Ht 5' 8"  (1.727 m)  Wt 222 lb (100.699 kg)  BMI 33.76 kg/m2  SpO2  96%   Review of Systems  Constitutional: Negative for fever.  HENT: Negative for hearing loss.   Eyes: Negative for visual disturbance.  Respiratory: Negative for shortness of breath.   Gastrointestinal: Negative for blood in stool.  Endocrine: Negative for cold intolerance.  Genitourinary: Negative for hematuria and difficulty urinating.  Musculoskeletal: Positive for back pain.  Skin: Negative for wound.  Allergic/Immunologic: Positive for environmental allergies.  Neurological: Negative for syncope and headaches.  Hematological: Does not bruise/bleed easily.  Psychiatric/Behavioral: Negative for dysphoric mood.       Objective:   Physical Exam VS: see vs page GEN: no distress HEAD: head: no deformity eyes: no periorbital swelling, no proptosis external nose and ears are normal mouth: no lesion seen NECK: a healed scar is present.  i do not appreciate a nodule in the thyroid or elsewhere in the neck CHEST WALL: no deformity LUNGS: clear to auscultation BREASTS:  No gynecomastia CV: reg rate and rhythm, no murmur RECTAL: normal external and internal exam.  heme neg. PROSTATE:  Normal size.  No nodule MUSCULOSKELETAL: muscle bulk and strength are grossly normal.  no obvious joint swelling.  gait is normal and steady EXTEMITIES: no deformity.  no ulcer  on the feet.  feet are of normal color and temp.  no edema PULSES: dorsalis pedis intact bilat.  no carotid bruit NEURO:  cn 2-12 grossly intact.   readily moves all 4's.  sensation is intact to touch on the feet SKIN:  Normal texture and temperature.  No rash or suspicious lesion is visible.   NODES:  None palpable at the neck PSYCH: alert, well-oriented.  Does not appear anxious nor depressed.      Assessment & Plan:  Wellness visit today, with problems stable, except as noted.  SEPARATE EVALUATION FOLLOWS--EACH PROBLEM HERE IS NEW, NOT RESPONDING TO TREATMENT, OR POSES SIGNIFICANT RISK TO THE PATIENT'S HEALTH: HISTORY  OF THE PRESENT ILLNESS: Pt states of intermittent food-only dysphagia sensation in the chest, and assoc heartburn.   PAST MEDICAL HISTORY reviewed and up to date today REVIEW OF SYSTEMS: No weight change.  Denies chest pain. PHYSICAL EXAMINATION: VITAL SIGNS:  See vs page GENERAL: no distress ABDOMEN: abdomen is soft, nontender.  no hepatosplenomegaly.  not distended.  no hernia. LAB/XRAY RESULTS: i personally reviewed electrocardiogram tracing (today): Indication: HTN Impression:normal. Lab Results  Component Value Date   WBC 7.5 02/22/2015   HGB 15.8 02/22/2015   HCT 47.2 02/22/2015   MCV 92.2 02/22/2015   PLT 220.0 02/22/2015  IMPRESSION: Karlene Lineman, recurrent Dysphagia, new GERD: new PLAN:  Ihave sent a prescription to your pharmacy, for prilosec Ref GI Weight loss is advised

## 2015-02-22 NOTE — Progress Notes (Signed)
we discussed code status.  pt requests full code, but would not want to be started or maintained on artificial life-support measures if there was not a reasonable chance of recovery 

## 2015-02-22 NOTE — Patient Instructions (Addendum)
i have sent a prescription to your pharmacy, for the heartburn. Please see a specialist for the swallowing.  you will receive a phone call, about a day and time for an appointment. Please also see a dietician specialist.  please consider these measures for your health:  minimize alcohol.  do not use tobacco products.  have a colonoscopy at least every 10 years from age 64.   keep firearms safely stored.  always use seat belts.  have working smoke alarms in your home.  see an eye doctor and dentist regularly.  never drive under the influence of alcohol or drugs (including prescription drugs).   blood tests are requested for you today.  We'll let you know about the results. Please return in 1 year.

## 2015-03-03 ENCOUNTER — Other Ambulatory Visit: Payer: Self-pay | Admitting: Endocrinology

## 2015-03-04 ENCOUNTER — Encounter: Payer: Self-pay | Admitting: Dietician

## 2015-03-04 ENCOUNTER — Encounter: Payer: Federal, State, Local not specified - PPO | Attending: Endocrinology | Admitting: Dietician

## 2015-03-04 VITALS — Ht 67.0 in | Wt 223.0 lb

## 2015-03-04 DIAGNOSIS — Z713 Dietary counseling and surveillance: Secondary | ICD-10-CM | POA: Insufficient documentation

## 2015-03-04 DIAGNOSIS — R635 Abnormal weight gain: Secondary | ICD-10-CM

## 2015-03-04 NOTE — Patient Instructions (Signed)
Add protein to your breakfast. Be mindful when you eat.  When am I full?  Before a snack ask, am I hungry? Be aware foods eaten out.  Consider the Calorie [employer] Pace app on your ipad. Continue the exercise habit.  Aim for 10,000 steps per day or one hour at the gym most days of the week.   Consider increasing your non starchy vegetables.

## 2015-03-04 NOTE — Progress Notes (Signed)
  Medical Nutrition Therapy:  Appt start time: 1100 end time:  1100.   Assessment:  Primary concerns today: Patient is here alone.  His thyroid was removed and he has been gaining weight. He has a Physiological scientist, exercises 3-4x/week burning 500 calories each time.  He follows weight watchers point system closely.  Hx includes sarcoidosis and sleep apnea.  He does use a C-Pap and has not required prednisone for the sarcoidosis recently.  Weight Hx:   Highest 219 lbs (today's weight) Lowest 170 lbs (2008) second thyroid surgery with total removal Goal 180 lbs He states that he is frustrated because prior to Thanksgiving he lost to 213 lbs then gained to current weight during Thanksgiving.  He is large boned, stocky frame with increased muscle mass.    Patient lives with wife.  Patient shops and wife cooks.  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  TANITA  BODY COMP RESULTS 03/04/15 219 lbs   BMI (kg/m^2) 34.3   Fat Mass (lbs) 70.5   Fat Free Mass (lbs) 148.5   Total Body Water (lbs) 108.5   Preferred Learning Style:   No preference indicated  Learning Readiness:   Ready  Change in progress  MEDICATIONS: see list   DIETARY INTAKE: Usual eating pattern includes 3 meals and 1-2 snacks per day. 24-hr recall:  B (6:30-7 AM): today (special) activia yogurt, 6 oz OJ, 2 haw[place]ian rolls with 2 sausage patties OR yogurt or oatmeal Snk ( AM): occasional fruit or 2 activia or 1 instant oatmeal L (12:00 PM): (From NH) starch, vegetable, 2-3 ounce portion of meat OR out to [employer] Snk (3-4 PM): fruit or yogurt, rice cakes D (6:30-7 PM): (Home or NH)  If home after 7:00 then eats oatmeal for dinner,  Starch, vegetable, 2-3 ounces of meat Snk ( PM): none Beverages: OJ, water, crystal lite, coffee with cream and sweet and low  Usual physical activity: 3-4 times per week.  20 minute sessions on Ecliptical, 100 sit ups then repeats until burned  500 calories (am).  He used to do more weights but has changed to more aerobic in attempt to manage his weight.  Estimated energy needs: 1500 calories 170 g carbohydrates 94 g protein 50 g fat  Progress Towards Goal(s):  In progress.   Nutritional Diagnosis:  NB-1.1 Food and nutrition-related knowledge deficit As related to energy balance.  As evidenced by inability to lose weight.    Intervention:  Nutrition counseling/education regarding weight management, intuitive eating.  Discussed portion sizes, nutritional composition of foods eaten out and use of Calorie [employer] Pace app to make better decisions. Discussed appropriate weight goal.  Add protein to your breakfast. Be mindful when you eat.  When am I full?  Before a snack ask, am I hungry? Be aware foods eaten out.  Consider the Calorie Edison Pace app on your ipad. Continue the exercise habit.  Aim for 10,000 steps per day or one hour at the gym most days of the week.   Consider increasing your non starchy vegetables.  Teaching Method Utilized:  Visual Auditory Hands on  Handouts given during visit include:  My plate  Weight loss tips  Label reading  Barriers to learning/adherence to lifestyle change: none  Demonstrated degree of understanding via:  Teach Back   Monitoring/Evaluation:  Dietary intake, exercise, label reading, and body weight in 1 month(s).

## 2015-03-14 ENCOUNTER — Other Ambulatory Visit: Payer: Self-pay | Admitting: Endocrinology

## 2015-04-05 ENCOUNTER — Encounter: Payer: Managed Care, Other (non HMO) | Attending: Endocrinology | Admitting: Dietician

## 2015-04-05 ENCOUNTER — Encounter: Payer: Self-pay | Admitting: Dietician

## 2015-04-05 DIAGNOSIS — E669 Obesity, unspecified: Secondary | ICD-10-CM

## 2015-04-05 DIAGNOSIS — R635 Abnormal weight gain: Secondary | ICD-10-CM | POA: Diagnosis not present

## 2015-04-05 DIAGNOSIS — Z713 Dietary counseling and surveillance: Secondary | ICD-10-CM | POA: Insufficient documentation

## 2015-04-05 NOTE — Progress Notes (Signed)
Medical Nutrition Therapy:  Appt start time: 0945 end time:  1000.   Assessment:  03/04/15 Primary concerns today: Patient is here alone.  His thyroid was removed and he has been gaining weight. He has a Physiological scientist, exercises 3-4x/week burning 500 calories each time.  He follows weight watchers point system closely.  Hx includes sarcoidosis and sleep apnea.  He does use a C-Pap and has not required prednisone for the sarcoidosis recently.  Weight Hx:   Highest 219 lbs (today's weight) Lowest 170 lbs (2008) second thyroid surgery with total removal Goal 180 lbs He states that he is frustrated because prior to Thanksgiving he lost to 213 lbs then gained to current weight during Thanksgiving.  He is large boned, stocky frame with increased muscle mass.    Patient lives with wife.  Patient shops and wife cooks.  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  TANITA  BODY COMP RESULTSb   03/04/15 219 lbs   BMI (kg/m^2) 34.3   Fat Mass (lbs) 70.5   Fat Free Mass (lbs) 148.5   Total Body Water (lbs) 108.5   04/05/15: Patient is here alone.  He has lost 3 lbs.  He states that he is sleeping better and gets about 7 hours each night.  He goes to the gym each morning around 5 am and works out 20 minutes on the ecliptical, rests for 2-3 minutes and then repeats.  Goal continues to be to burn 500 calories.  He has been more mindful about his eating choices.  He feels that his clothes are fitting better.  He states that he is challenged to always have healthy snacks available.  TANITA  BODY COMP RESULTS 04/05/15 216 lbs   BMI (kg/m^2) 33.8   Fat Mass (lbs) 78   Fat Free Mass (lbs) 138   Total Body Water (lbs) 101   Preferred Learning Style:   No preference indicated  Learning Readiness:   Ready  Change in progress  MEDICATIONS: see list   DIETARY INTAKE: Usual eating pattern includes 3 meals and 1-2 snacks per day.  Doesn't eat after 7 pm  unless it is 1-2 packs of oatmeal or fruit. 24-hr recall:  B (6:30-7 AM):  Meal replacement prior to workout and 1 after along with 1 pack of oatmeal with raisins Snk ( AM): occasional activia L (12:00 PM): (From NH) starch, vegetable, 2-3 ounce portion of meat  Snk (3-4 PM): fruit or yogurt, rice cakes D (6:30-7 PM): (Home or NH)  If home after 7:00 then eats oatmeal for dinner,  Starch, vegetable, 2-3 ounces of meat or [place] restaurant with fried rice and sauteed vegetables Snk ( PM): none Beverages: OJ, water, crystal lite, coffee with cream and sweet and low  Usual physical activity: 3-4 times per week.  20 minute sessions on Ecliptical, 100 sit ups then repeats until burned 500 calories (am).  He used to do more weights but has changed to more aerobic in attempt to manage his weight.  Estimated energy needs: 1500 calories 170 g carbohydrates 94 g protein 50 g fat  Progress Towards Goal(s):  In progress.   Nutritional Diagnosis:  NB-1.1 Food and nutrition-related knowledge deficit As related to energy balance.  As evidenced by inability to lose weight.    Intervention:  Nutrition counseling/education regarding weight management, intuitive eating continued.  Discussed choices when eating out.  Add protein to your breakfast. Be mindful when you eat.  When am I full?  Before a snack ask, am I hungry? Be aware foods eaten out.  Consider the Calorie [employer] Pace app on your ipad. Continue the exercise habit.  Aim for 10,000 steps per day or one hour at the gym most days of the week.   Consider increasing your non starchy vegetables.  Teaching Method Utilized:  Visual Auditory Hands on  Handouts given during visit include:  My plate  Weight loss tips  Label reading  Barriers to learning/adherence to lifestyle change: none  Demonstrated degree of understanding via:  Teach Back   Monitoring/Evaluation:  Dietary intake, exercise, label reading, and body weight prn.

## 2015-04-27 ENCOUNTER — Ambulatory Visit: Payer: Federal, State, Local not specified - PPO | Admitting: Gastroenterology

## 2015-06-03 ENCOUNTER — Other Ambulatory Visit: Payer: Self-pay

## 2015-06-03 MED ORDER — ACYCLOVIR 800 MG PO TABS: 800.0000 mg | ORAL_TABLET | Freq: Every day | ORAL | Status: DC

## 2015-06-16 ENCOUNTER — Ambulatory Visit: Payer: Managed Care, Other (non HMO) | Admitting: Gastroenterology

## 2015-06-19 ENCOUNTER — Other Ambulatory Visit: Payer: Self-pay | Admitting: Endocrinology

## 2015-09-19 ENCOUNTER — Other Ambulatory Visit: Payer: Self-pay | Admitting: Endocrinology

## 2015-12-23 ENCOUNTER — Other Ambulatory Visit: Payer: Self-pay | Admitting: Endocrinology

## 2016-01-27 ENCOUNTER — Other Ambulatory Visit: Payer: Self-pay

## 2016-01-27 MED ORDER — LEVOTHYROXINE SODIUM 175 MCG PO TABS: 175.0000 ug | ORAL_TABLET | Freq: Every day | ORAL | 1 refills | Status: DC

## 2016-02-20 NOTE — Progress Notes (Signed)
Subjective:    Patient ID: Philip Woodard, male    DOB: 05-Nov-1950, 65 y.o.   MRN: [id number]  HPI Pt is here for regular wellness examination, and is feeling pretty well in general, and says chronic med probs are stable, except as noted below Past Medical History:  Diagnosis Date  . Alkaline phosphatase raised 06/28/2011   Fluctuating - suspect due to sarcoidosis  . Allergic rhinitis   . Genital herpes   . Hemorrhoids   . Hyperglycemia   . Hyperplastic lymph node 06/27/2011   Submental node excised 1/02; regrowth: re-excision 10/06  . Hypothyroidism   . Multinodular goiter   . Routine general medical examination at a health care facility   . Sarcoidosis ([place])   . Sleep apnea     Past Surgical History:  Procedure Laterality Date  . ROTATOR CUFF REPAIR Bilateral [id number]  . THYROIDECTOMY  2002    Social History   Social History  . Marital status: Married    Spouse name: N/A  . Number of children: N/A  . Years of education: N/A   Occupational History  . Not on file.   Social History Main Topics  . Smoking status: Never Smoker  . Smokeless tobacco: Never Used  . Alcohol use Yes     Comment: 3 times per month  . Drug use: Unknown  . Sexual activity: Not on file   Other Topics Concern  . Not on file   Social History Narrative  . No narrative on file    Current Outpatient Prescriptions on File Prior to Visit  Medication Sig Dispense Refill  . acyclovir (ZOVIRAX) 800 MG tablet Take 1 tablet (800 mg total) by mouth daily. 90 tablet 0  . levothyroxine (SYNTHROID, LEVOTHROID) 175 MCG tablet Take 1 tablet (175 mcg total) by mouth daily. 90 tablet 1  . Multiple Vitamin (MULTIVITAMIN WITH MINERALS) TABS tablet Take 1 tablet by mouth daily.    Marland Kitchen omeprazole (PRILOSEC) 40 MG capsule Take 1 capsule (40 mg total) by mouth daily. (Patient not taking: Reported on 02/22/2016) 30 capsule 3   No current facility-administered medications on file prior to visit.      Allergies  Allergen Reactions  . Other     SEAFOOD    Family History  Problem Relation Age of Onset  . Asthma Cousin   . Sarcoidosis Neg Hx     BP 134/84   Pulse 72   Ht 5' 8"  (1.727 m)   Wt 216 lb (98 kg)   SpO2 96%   BMI 32.84 kg/m   Review of Systems Constitutional: Negative for fever.  HENT: Negative for hearing loss.   Eyes: Negative for visual disturbance.  Respiratory: Negative for shortness of breath.  CV: denies chest pain  Gastrointestinal: Negative for blood in stool.  Endocrine: Negative for cold intolerance.  Genitourinary: Negative for hematuria and difficulty urinating.  Musculoskeletal: Positive for back pain.  Skin: Negative for rash.  Allergic/Immunologic: Positive for environmental allergies.  Neurological: Negative for syncope and headaches.  Hematological: Does not bruise/bleed easily.  Psychiatric/Behavioral: Negative for dysphoric mood.     Objective:   Physical Exam VS: see vs page GEN: no distress HEAD: head: no deformity eyes: no periorbital swelling, no proptosis external nose and ears are normal mouth: no lesion seen NECK: a healed scar is present.  I do not appreciate a nodule in the thyroid or elsewhere in the neck CHEST WALL: no deformity LUNGS: clear to auscultation BREASTS:  No gynecomastia  CV: reg rate and rhythm, no murmur RECTAL/PROSTATE: declined.  MUSCULOSKELETAL: muscle bulk and strength are grossly normal.  no obvious joint swelling.  gait is normal and steady.  Old healed surgical scars on both shoulders.  EXTEMITIES: no deformity.  no ulcer on the feet.  feet are of normal color and temp.  no edema PULSES: dorsalis pedis intact bilat.  no carotid bruit NEURO:  cn 2-12 grossly intact.   readily moves all 4's.  sensation is intact to touch on the feet SKIN:  Normal texture and temperature.  No rash or suspicious lesion is visible.   NODES:  None palpable at the neck PSYCH: alert, well-oriented.  Does not appear  anxious nor depressed.  i personally reviewed electrocardiogram tracing (today):  Indication: wellness Impression: LAE     Assessment & Plan:  Wellness visit today, with problems stable, except as noted.    Patient is advised the following: Patient Instructions  Please consider these measures for your health:  minimize alcohol.  Do not use tobacco products.  Have a colonoscopy at least every 10 years from age 25. Keep firearms safely stored.  Always use seat belts.  have working smoke alarms in your home.  See an eye doctor and dentist regularly.  Never drive under the influence of alcohol or drugs (including prescription drugs).   please let me know what your wishes would be, if artificial life support measures should become necessary.  It is critically important to prevent falling down (keep floor areas well-lit, dry, and free of loose objects.  If you have a cane, walker, or wheelchair, you should use it, even for short trips around the house.  Wear flat-soled shoes.  Also, try not to rush).   blood tests are requested for you today.  We'll let you know about the results. Please return in 1 year.

## 2016-02-22 ENCOUNTER — Encounter: Payer: Self-pay | Admitting: Endocrinology

## 2016-02-22 ENCOUNTER — Ambulatory Visit (INDEPENDENT_AMBULATORY_CARE_PROVIDER_SITE_OTHER): Payer: Managed Care, Other (non HMO) | Admitting: Endocrinology

## 2016-02-22 VITALS — BP 134/84 | HR 72 | Ht 68.0 in | Wt 216.0 lb

## 2016-02-22 DIAGNOSIS — Z0000 Encounter for general adult medical examination without abnormal findings: Secondary | ICD-10-CM

## 2016-02-22 DIAGNOSIS — Z23 Encounter for immunization: Secondary | ICD-10-CM

## 2016-02-22 DIAGNOSIS — R7309 Other abnormal glucose: Secondary | ICD-10-CM

## 2016-02-22 LAB — CBC WITH DIFFERENTIAL/PLATELET
Basophils Absolute: 0.1 10*3/uL (ref 0.0–0.1)
Basophils Relative: 0.6 % (ref 0.0–3.0)
EOS ABS: 0.4 10*3/uL (ref 0.0–0.7)
Eosinophils Relative: 4.1 % (ref 0.0–5.0)
HCT: 47.2 % (ref 39.0–52.0)
HEMOGLOBIN: 16.2 g/dL (ref 13.0–17.0)
LYMPHS ABS: 3.2 10*3/uL (ref 0.7–4.0)
Lymphocytes Relative: 36.8 % (ref 12.0–46.0)
MCHC: 34.3 g/dL (ref 30.0–36.0)
MCV: 91.4 fl (ref 78.0–100.0)
MONO ABS: 0.8 10*3/uL (ref 0.1–1.0)
Monocytes Relative: 9.4 % (ref 3.0–12.0)
NEUTROS PCT: 49.1 % (ref 43.0–77.0)
Neutro Abs: 4.3 10*3/uL (ref 1.4–7.7)
Platelets: 231 10*3/uL (ref 150.0–400.0)
RBC: 5.16 Mil/uL (ref 4.22–5.81)
RDW: 15.1 % (ref 11.5–15.5)
WBC: 8.7 10*3/uL (ref 4.0–10.5)

## 2016-02-22 LAB — LIPID PANEL
CHOL/HDL RATIO: 3
Cholesterol: 149 mg/dL (ref 0–200)
HDL: 46.3 mg/dL (ref >39.00)
LDL Cholesterol: 87 mg/dL (ref 0–99)
NONHDL: 102.25
Triglycerides: 77 mg/dL (ref 0.0–149.0)
VLDL: 15.4 mg/dL (ref 0.0–40.0)

## 2016-02-22 LAB — TSH: TSH: 4.2 u[IU]/mL (ref 0.35–4.50)

## 2016-02-22 LAB — PSA: PSA: 0.85 ng/mL (ref 0.10–4.00)

## 2016-02-22 LAB — URINALYSIS, ROUTINE W REFLEX MICROSCOPIC
BILIRUBIN URINE: NEGATIVE
HGB URINE DIPSTICK: NEGATIVE
Ketones, ur: NEGATIVE
Leukocytes, UA: NEGATIVE
NITRITE: NEGATIVE
PH: 7 (ref 5.0–8.0)
Specific Gravity, Urine: 1.02 (ref 1.000–1.030)
TOTAL PROTEIN, URINE-UPE24: NEGATIVE
URINE GLUCOSE: NEGATIVE
UROBILINOGEN UA: 0.2 (ref 0.0–1.0)
WBC UA: NONE SEEN (ref 0–?)

## 2016-02-22 LAB — BASIC METABOLIC PANEL
BUN: 16 mg/dL (ref 6–23)
CALCIUM: 9.6 mg/dL (ref 8.4–10.5)
CO2: 32 mEq/L (ref 19–32)
Chloride: 102 mEq/L (ref 96–112)
Creatinine, Ser: 1.15 mg/dL (ref 0.40–1.50)
GFR: 82.42 mL/min (ref >60.00)
GLUCOSE: 101 mg/dL — AB (ref 70–99)
Potassium: 4.4 mEq/L (ref 3.5–5.1)
Sodium: 139 mEq/L (ref 135–145)

## 2016-02-22 LAB — HEPATIC FUNCTION PANEL
ALBUMIN: 4.5 g/dL (ref 3.5–5.2)
ALK PHOS: 67 U/L (ref 39–117)
ALT: 35 U/L (ref 0–53)
AST: 44 U/L — ABNORMAL HIGH (ref 0–37)
Bilirubin, Direct: 0.2 mg/dL (ref 0.0–0.3)
Total Bilirubin: 0.9 mg/dL (ref 0.2–1.2)
Total Protein: 7.1 g/dL (ref 6.0–8.3)

## 2016-02-22 LAB — HEMOGLOBIN A1C: HEMOGLOBIN A1C: 5.6 % (ref 4.6–6.5)

## 2016-02-22 NOTE — Patient Instructions (Addendum)
Please consider these measures for your health:  minimize alcohol.  Do not use tobacco products.  Have a colonoscopy at least every 10 years from age 65. Keep firearms safely stored.  Always use seat belts.  have working smoke alarms in your home.  See an eye doctor and dentist regularly.  Never drive under the influence of alcohol or drugs (including prescription drugs).   please let me know what your wishes would be, if artificial life support measures should become necessary.  It is critically important to prevent falling down (keep floor areas well-lit, dry, and free of loose objects.  If you have a cane, walker, or wheelchair, you should use it, even for short trips around the house.  Wear flat-soled shoes.  Also, try not to rush).   blood tests are requested for you today.  We'll let you know about the results. Please return in 1 year.

## 2016-02-23 LAB — HIV ANTIBODY (ROUTINE TESTING W REFLEX): HIV: NONREACTIVE

## 2016-03-09 ENCOUNTER — Other Ambulatory Visit: Payer: Self-pay | Admitting: Endocrinology

## 2016-03-22 ENCOUNTER — Telehealth: Payer: Self-pay | Admitting: Endocrinology

## 2016-03-22 ENCOUNTER — Encounter: Payer: Self-pay | Admitting: Endocrinology

## 2016-03-22 ENCOUNTER — Ambulatory Visit (INDEPENDENT_AMBULATORY_CARE_PROVIDER_SITE_OTHER): Payer: Managed Care, Other (non HMO) | Admitting: Endocrinology

## 2016-03-22 DIAGNOSIS — J069 Acute upper respiratory infection, unspecified: Secondary | ICD-10-CM

## 2016-03-22 MED ORDER — PROMETHAZINE-CODEINE 6.25-10 MG/5ML PO SYRP: 5.0000 mL | ORAL_SOLUTION | ORAL | 0 refills | Status: DC | PRN

## 2016-03-22 MED ORDER — CEFUROXIME AXETIL 250 MG PO TABS: 250.0000 mg | ORAL_TABLET | Freq: Two times a day (BID) | ORAL | 0 refills | Status: AC

## 2016-03-22 NOTE — Patient Instructions (Addendum)
I have sent a prescription to your pharmacy, for an antibiotic pill.   Here is a prescription for the cough.  Loratadine-d (non-prescription) will help your congestion.   I hope you feel better soon.  If you don't feel better by next week, please call back.  Please call sooner if you get worse.

## 2016-03-22 NOTE — Progress Notes (Signed)
   Subjective:    Patient ID: Philip Woodard, male    DOB: 03-Apr-1950, 66 y.o.   MRN: [id number]  HPI Pt states 10 day of slight dry-quality cough in the chest, and assoc congestion.   Past Medical History:  Diagnosis Date  . Alkaline phosphatase raised 06/28/2011   Fluctuating - suspect due to sarcoidosis  . Allergic rhinitis   . Genital herpes   . Hemorrhoids   . Hyperglycemia   . Hyperplastic lymph node 06/27/2011   Submental node excised 1/02; regrowth: re-excision 10/06  . Hypothyroidism   . Multinodular goiter   . Routine general medical examination at a health care facility   . Sarcoidosis ([place])   . Sleep apnea     Past Surgical History:  Procedure Laterality Date  . ROTATOR CUFF REPAIR Bilateral [id number]  . THYROIDECTOMY  2002    Social History   Social History  . Marital status: Married    Spouse name: N/A  . Number of children: N/A  . Years of education: N/A   Occupational History  . Not on file.   Social History Main Topics  . Smoking status: Never Smoker  . Smokeless tobacco: Never Used  . Alcohol use Yes     Comment: 3 times per month  . Drug use: Unknown  . Sexual activity: Not on file   Other Topics Concern  . Not on file   Social History Narrative  . No narrative on file    Current Outpatient Prescriptions on File Prior to Visit  Medication Sig Dispense Refill  . acyclovir (ZOVIRAX) 800 MG tablet TAKE 1 TABLET(800 MG) BY MOUTH DAILY 90 tablet 0  . levothyroxine (SYNTHROID, LEVOTHROID) 175 MCG tablet Take 1 tablet (175 mcg total) by mouth daily. 90 tablet 1  . Multiple Vitamin (MULTIVITAMIN WITH MINERALS) TABS tablet Take 1 tablet by mouth daily.    Marland Kitchen omeprazole (PRILOSEC) 40 MG capsule Take 1 capsule (40 mg total) by mouth daily. 30 capsule 3   No current facility-administered medications on file prior to visit.     Allergies  Allergen Reactions  . Other     SEAFOOD    Family History  Problem Relation Age of Onset  . Asthma  Cousin   . Sarcoidosis Neg Hx     BP (!) 144/80   Pulse 94   Temp 98.3 F (36.8 C) (Oral)   Ht 5' 8"  (1.727 m)   Wt 216 lb (98 kg)   SpO2 95%   BMI 32.84 kg/m    Review of Systems Denies earache and fever.      Objective:   Physical Exam VITAL SIGNS:  See vs page GENERAL: no distress. head: no deformity  eyes: no periorbital swelling, no proptosis  external nose and ears are normal  mouth: no lesion seen Both tm's are red  LUNGS:  Clear to auscultation     Assessment & Plan:  URI: new.    Patient is advised the following: Patient Instructions  I have sent a prescription to your pharmacy, for an antibiotic pill.   Here is a prescription for the cough.  Loratadine-d (non-prescription) will help your congestion.   I hope you feel better soon.  If you don't feel better by next week, please call back.  Please call sooner if you get worse.

## 2016-03-24 DIAGNOSIS — J069 Acute upper respiratory infection, unspecified: Secondary | ICD-10-CM | POA: Insufficient documentation

## 2016-05-04 ENCOUNTER — Telehealth: Payer: Self-pay | Admitting: Endocrinology

## 2016-05-04 MED ORDER — LEVOTHYROXINE SODIUM 175 MCG PO TABS: 175.0000 ug | ORAL_TABLET | Freq: Every day | ORAL | 1 refills | Status: DC

## 2016-05-04 NOTE — Telephone Encounter (Signed)
Philip Woodard is requiring a 90 day supply levothyroxine called to [facility] on [address]

## 2016-05-04 NOTE — Telephone Encounter (Signed)
Refill submitted. 

## 2016-06-07 ENCOUNTER — Encounter: Payer: Self-pay | Admitting: Gastroenterology

## 2016-07-05 ENCOUNTER — Other Ambulatory Visit: Payer: Self-pay | Admitting: Endocrinology

## 2016-09-07 ENCOUNTER — Encounter: Payer: Self-pay | Admitting: Endocrinology

## 2016-09-07 ENCOUNTER — Ambulatory Visit (INDEPENDENT_AMBULATORY_CARE_PROVIDER_SITE_OTHER): Payer: BLUE CROSS/BLUE SHIELD | Admitting: Endocrinology

## 2016-09-07 VITALS — BP 142/76 | HR 78 | Ht 68.0 in | Wt 214.0 lb

## 2016-09-07 DIAGNOSIS — R0609 Other forms of dyspnea: Secondary | ICD-10-CM | POA: Diagnosis not present

## 2016-09-07 DIAGNOSIS — R0600 Dyspnea, unspecified: Secondary | ICD-10-CM

## 2016-09-07 DIAGNOSIS — M2550 Pain in unspecified joint: Secondary | ICD-10-CM

## 2016-09-07 LAB — SEDIMENTATION RATE: SED RATE: 5 mm/h (ref 0–20)

## 2016-09-07 LAB — HIGH SENSITIVITY CRP: CRP HIGH SENSITIVITY: 1.64 mg/L (ref 0.000–5.000)

## 2016-09-07 MED ORDER — DICLOFENAC SODIUM 1 % TD GEL: 4.0000 g | Freq: Four times a day (QID) | TRANSDERMAL | 11 refills | Status: DC

## 2016-09-07 NOTE — Patient Instructions (Addendum)
blood tests are requested for you today.  We'll let you know about the results. I have sent a prescription to your pharmacy, for a gel to apply to the painful areas.

## 2016-09-07 NOTE — Progress Notes (Signed)
   Subjective:    Patient ID: Philip Woodard, male    DOB: 1950/04/25, 66 y.o.   MRN: [id number]  HPI In eval at [facility] for a study, he was noted to have elev CRP.  He has few years of worsening (now moderate) arthralgias throughout the body, and assoc doe.   Past Medical History:  Diagnosis Date  . Alkaline phosphatase raised 06/28/2011   Fluctuating - suspect due to sarcoidosis  . Allergic rhinitis   . Genital herpes   . Hemorrhoids   . Hyperglycemia   . Hyperplastic lymph node 06/27/2011   Submental node excised 1/02; regrowth: re-excision 10/06  . Hypothyroidism   . Multinodular goiter   . Routine general medical examination at a health care facility   . Sarcoidosis   . Sleep apnea     Past Surgical History:  Procedure Laterality Date  . ROTATOR CUFF REPAIR Bilateral [id number]  . THYROIDECTOMY  2002    Social History   Social History  . Marital status: Married    Spouse name: N/A  . Number of children: N/A  . Years of education: N/A   Occupational History  . Not on file.   Social History Main Topics  . Smoking status: Never Smoker  . Smokeless tobacco: Never Used  . Alcohol use Yes     Comment: 3 times per month  . Drug use: Unknown  . Sexual activity: Not on file   Other Topics Concern  . Not on file   Social History Narrative  . No narrative on file    Current Outpatient Prescriptions on File Prior to Visit  Medication Sig Dispense Refill  . acyclovir (ZOVIRAX) 800 MG tablet TAKE 1 TABLET BY MOUTH EVERY DAY 30 tablet 2  . levothyroxine (SYNTHROID, LEVOTHROID) 175 MCG tablet Take 1 tablet (175 mcg total) by mouth daily. 90 tablet 1  . Multiple Vitamin (MULTIVITAMIN WITH MINERALS) TABS tablet Take 1 tablet by mouth daily.     No current facility-administered medications on file prior to visit.     Allergies  Allergen Reactions  . Other     SEAFOOD    Family History  Problem Relation Age of Onset  . Asthma Cousin   . Sarcoidosis Neg Hx     BP  (!) 142/76   Pulse 78   Ht 5' 8"  (1.727 m)   Wt 214 lb (97.1 kg)   SpO2 97%   BMI 32.54 kg/m    Review of Systems He denies fever, wt loss, rash, dysuria, and cough.     Objective:   Physical Exam VITAL SIGNS:  See vs page GENERAL: no distress Gait: normal and steady LUNGS:  Clear to auscultation   I personally reviewed spirometry tracing (today):  Indication: dyspnea Impression: no obstruction Compared to 2014: no significant change     Assessment & Plan:  Arthralgias, uncertain etiology.  elev CRP, new, uncertain etiology.  HTN: poss situational.  We'll follow.  Patient Instructions  blood tests are requested for you today.  We'll let you know about the results. I have sent a prescription to your pharmacy, for a gel to apply to the painful areas.

## 2016-09-08 LAB — ANA: Anti Nuclear Antibody(ANA): NEGATIVE

## 2016-09-11 ENCOUNTER — Encounter: Payer: Self-pay | Admitting: Endocrinology

## 2016-09-11 ENCOUNTER — Ambulatory Visit (INDEPENDENT_AMBULATORY_CARE_PROVIDER_SITE_OTHER): Payer: BLUE CROSS/BLUE SHIELD | Admitting: Endocrinology

## 2016-09-11 VITALS — BP 122/82 | HR 82 | Ht 68.0 in | Wt 212.0 lb

## 2016-09-11 DIAGNOSIS — L568 Other specified acute skin changes due to ultraviolet radiation: Secondary | ICD-10-CM

## 2016-09-11 MED ORDER — METHYLPREDNISOLONE 4 MG PO TBPK: ORAL_TABLET | ORAL | 0 refills | Status: DC

## 2016-09-11 NOTE — Progress Notes (Signed)
   Subjective:    Patient ID: Philip Woodard, male    DOB: 1950/06/25, 66 y.o.   MRN: [id number]  HPI Pt states 2 days of moderate redness of the face and back of the neck, and assoc pain.  He feels this was caused by sun exposure, exac by sarcoidosis.  No help with OTC rx.  Past Medical History:  Diagnosis Date  . Alkaline phosphatase raised 06/28/2011   Fluctuating - suspect due to sarcoidosis  . Allergic rhinitis   . Genital herpes   . Hemorrhoids   . Hyperglycemia   . Hyperplastic lymph node 06/27/2011   Submental node excised 1/02; regrowth: re-excision 10/06  . Hypothyroidism   . Multinodular goiter   . Routine general medical examination at a health care facility   . Sarcoidosis   . Sleep apnea     Past Surgical History:  Procedure Laterality Date  . ROTATOR CUFF REPAIR Bilateral [id number]  . THYROIDECTOMY  2002    Social History   Social History  . Marital status: Married    Spouse name: N/A  . Number of children: N/A  . Years of education: N/A   Occupational History  . Not on file.   Social History Main Topics  . Smoking status: Never Smoker  . Smokeless tobacco: Never Used  . Alcohol use Yes     Comment: 3 times per month  . Drug use: Unknown  . Sexual activity: Not on file   Other Topics Concern  . Not on file   Social History Narrative  . No narrative on file    Current Outpatient Prescriptions on File Prior to Visit  Medication Sig Dispense Refill  . acyclovir (ZOVIRAX) 800 MG tablet TAKE 1 TABLET BY MOUTH EVERY DAY 30 tablet 2  . diclofenac sodium (VOLTAREN) 1 % GEL Apply 4 g topically 4 (four) times daily. 100 g 11  . levothyroxine (SYNTHROID, LEVOTHROID) 175 MCG tablet Take 1 tablet (175 mcg total) by mouth daily. 90 tablet 1  . Multiple Vitamin (MULTIVITAMIN WITH MINERALS) TABS tablet Take 1 tablet by mouth daily.     No current facility-administered medications on file prior to visit.     Allergies  Allergen Reactions  . Other    SEAFOOD    Family History  Problem Relation Age of Onset  . Asthma Cousin   . Sarcoidosis Neg Hx     BP 122/82   Pulse 82   Ht 5' 8"  (1.727 m)   Wt 212 lb (96.2 kg)   SpO2 96%   BMI 32.23 kg/m    Review of Systems Denies fever    Objective:   Physical Exam VITAL SIGNS:  See vs page.  GENERAL: no distress.  Skin: mild erythema of the face, neck and ears.  There is swelling of the skin, also.        Assessment & Plan:  Sunburn, new Sarcoidosis.  This exacerbates the skin injury.  Patient Instructions  I have sent a prescription to your pharmacy, for a steroid "pack." You should expect the skin to flake off.   I hope you feel better soon.  If you don't feel better by next week, please call back.  Please call sooner if you get worse.

## 2016-09-11 NOTE — Patient Instructions (Addendum)
I have sent a prescription to your pharmacy, for a steroid "pack." You should expect the skin to flake off.   I hope you feel better soon.  If you don't feel better by next week, please call back.  Please call sooner if you get worse.

## 2016-09-12 ENCOUNTER — Ambulatory Visit: Payer: BLUE CROSS/BLUE SHIELD | Admitting: Endocrinology

## 2016-10-25 ENCOUNTER — Other Ambulatory Visit: Payer: Self-pay | Admitting: Endocrinology

## 2016-11-27 ENCOUNTER — Telehealth: Payer: Self-pay | Admitting: Endocrinology

## 2016-12-18 ENCOUNTER — Telehealth: Payer: Self-pay | Admitting: Endocrinology

## 2016-12-18 NOTE — Telephone Encounter (Signed)
08:45 tomorrow

## 2016-12-18 NOTE — Telephone Encounter (Signed)
Patient called in with symptoms of "violent cough, greenish/yellow flem, and hard time talking". Scheduled patient at first appointment available 12/22/16. Please call patient and advise if able to get in sooner.

## 2016-12-18 NOTE — Telephone Encounter (Signed)
Called patient & left VM asking him to please call back ASAP to get on the schedule if he can make 8:45 appt tomorrow.

## 2016-12-19 ENCOUNTER — Other Ambulatory Visit: Payer: Self-pay

## 2016-12-19 ENCOUNTER — Encounter: Payer: Self-pay | Admitting: Endocrinology

## 2016-12-19 ENCOUNTER — Ambulatory Visit (INDEPENDENT_AMBULATORY_CARE_PROVIDER_SITE_OTHER): Payer: BLUE CROSS/BLUE SHIELD | Admitting: Endocrinology

## 2016-12-19 ENCOUNTER — Ambulatory Visit
Admission: RE | Admit: 2016-12-19 | Discharge: 2016-12-19 | Disposition: A | Discharge status: Home / Self Care | Payer: BLUE CROSS/BLUE SHIELD | Source: Ambulatory Visit | Attending: Endocrinology | Admitting: Endocrinology

## 2016-12-19 DIAGNOSIS — R059 Cough, unspecified: Secondary | ICD-10-CM | POA: Insufficient documentation

## 2016-12-19 DIAGNOSIS — Z23 Encounter for immunization: Secondary | ICD-10-CM | POA: Diagnosis not present

## 2016-12-19 DIAGNOSIS — R05 Cough: Secondary | ICD-10-CM

## 2016-12-19 MED ORDER — DOXYCYCLINE HYCLATE 100 MG PO TABS: 100.0000 mg | ORAL_TABLET | Freq: Two times a day (BID) | ORAL | 0 refills | Status: DC

## 2016-12-19 MED ORDER — FLUTICASONE-SALMETEROL 100-50 MCG/DOSE IN AEPB: 1.0000 | INHALATION_SPRAY | Freq: Two times a day (BID) | RESPIRATORY_TRACT | 3 refills | Status: DC

## 2016-12-19 MED ORDER — PROMETHAZINE-CODEINE 6.25-10 MG/5ML PO SYRP: 5.0000 mL | ORAL_SOLUTION | ORAL | 0 refills | Status: DC | PRN

## 2016-12-19 NOTE — Telephone Encounter (Signed)
Called patient, removed old pharamicies & sent prescription to correct pharmacy.

## 2016-12-19 NOTE — Patient Instructions (Addendum)
A chest x-ray is requested for you today.  We'll let you know about the results.  I have sent prescriptions to your pharmacy: inhaler and antibiotic.   Here is a prescription for cough syrup.  Loratadine-d (non-prescription) will help your congestion.   I hope you feel better soon.  If you don't feel better by next week, please call back.  Please call sooner if you get worse.

## 2016-12-19 NOTE — Telephone Encounter (Signed)
Patient uses [facility] on [address] in [place]. Please remove [facility] from chart.

## 2016-12-19 NOTE — Progress Notes (Signed)
   Subjective:    Patient ID: Philip Woodard, male    DOB: 26-Dec-1950, 66 y.o.   MRN: [id number]  HPI Pt states 5 days of moderate prod-quality cough in the chest, and assoc nasal congestion.  Past Medical History:  Diagnosis Date  . Alkaline phosphatase raised 06/28/2011   Fluctuating - suspect due to sarcoidosis  . Allergic rhinitis   . Genital herpes   . Hemorrhoids   . Hyperglycemia   . Hyperplastic lymph node 06/27/2011   Submental node excised 1/02; regrowth: re-excision 10/06  . Hypothyroidism   . Multinodular goiter   . Routine general medical examination at a health care facility   . Sarcoidosis   . Sleep apnea     Past Surgical History:  Procedure Laterality Date  . ROTATOR CUFF REPAIR Bilateral [id number]  . THYROIDECTOMY  2002    Social History   Social History  . Marital status: Married    Spouse name: N/A  . Number of children: N/A  . Years of education: N/A   Occupational History  . Not on file.   Social History Main Topics  . Smoking status: Never Smoker  . Smokeless tobacco: Never Used  . Alcohol use Yes     Comment: 3 times per month  . Drug use: Unknown  . Sexual activity: Not on file   Other Topics Concern  . Not on file   Social History Narrative  . No narrative on file    Current Outpatient Prescriptions on File Prior to Visit  Medication Sig Dispense Refill  . acyclovir (ZOVIRAX) 800 MG tablet TAKE 1 TABLET BY MOUTH EVERY DAY 30 tablet 2  . diclofenac sodium (VOLTAREN) 1 % GEL Apply 4 g topically 4 (four) times daily. 100 g 11  . levothyroxine (SYNTHROID, LEVOTHROID) 175 MCG tablet TAKE 1 TABLET BY MOUTH DAILY 90 tablet 1  . Multiple Vitamin (MULTIVITAMIN WITH MINERALS) TABS tablet Take 1 tablet by mouth daily.     No current facility-administered medications on file prior to visit.     Allergies  Allergen Reactions  . Other     SEAFOOD    Family History  Problem Relation Age of Onset  . Asthma Cousin   . Sarcoidosis Neg Hx      BP 122/74   Pulse 75   Wt 201 lb 6.4 oz (91.4 kg)   SpO2 96%   BMI 30.62 kg/m    Review of Systems Denies fever and sob, but he has wheezing.     Objective:   Physical Exam VITAL SIGNS:  See vs page GENERAL: no distress head: no deformity  eyes: no periorbital swelling, no proptosis  external nose and ears are normal  mouth: no lesion seen LUNGS:  Clear to auscultation      Assessment & Plan:  Cough, new.  R/o pneumonia.    Patient Instructions  A chest x-ray is requested for you today.  We'll let you know about the results.  I have sent prescriptions to your pharmacy: inhaler and antibiotic.   Here is a prescription for cough syrup.  Loratadine-d (non-prescription) will help your congestion.   I hope you feel better soon.  If you don't feel better by next week, please call back.  Please call sooner if you get worse.

## 2016-12-20 ENCOUNTER — Encounter: Payer: Self-pay | Admitting: Endocrinology

## 2016-12-20 MED ORDER — CEPHALEXIN 500 MG PO CAPS: 500.0000 mg | ORAL_CAPSULE | Freq: Three times a day (TID) | ORAL | 0 refills | Status: DC

## 2016-12-20 MED ORDER — FLUTICASONE-SALMETEROL 100-50 MCG/DOSE IN AEPB: 1.0000 | INHALATION_SPRAY | Freq: Two times a day (BID) | RESPIRATORY_TRACT | 0 refills | Status: DC

## 2016-12-22 ENCOUNTER — Ambulatory Visit: Payer: BLUE CROSS/BLUE SHIELD | Admitting: Endocrinology

## 2017-01-10 ENCOUNTER — Ambulatory Visit (INDEPENDENT_AMBULATORY_CARE_PROVIDER_SITE_OTHER): Payer: BLUE CROSS/BLUE SHIELD | Admitting: Endocrinology

## 2017-01-10 ENCOUNTER — Encounter: Payer: Self-pay | Admitting: Endocrinology

## 2017-01-10 ENCOUNTER — Ambulatory Visit
Admission: RE | Admit: 2017-01-10 | Discharge: 2017-01-10 | Disposition: A | Discharge status: Home / Self Care | Payer: BLUE CROSS/BLUE SHIELD | Source: Ambulatory Visit | Attending: Endocrinology | Admitting: Endocrinology

## 2017-01-10 VITALS — BP 150/88 | HR 71 | Wt 202.8 lb

## 2017-01-10 DIAGNOSIS — M25579 Pain in unspecified ankle and joints of unspecified foot: Secondary | ICD-10-CM | POA: Insufficient documentation

## 2017-01-10 DIAGNOSIS — M25572 Pain in left ankle and joints of left foot: Secondary | ICD-10-CM | POA: Diagnosis not present

## 2017-01-10 LAB — CBC WITH DIFFERENTIAL/PLATELET
BASOS PCT: 0.9 % (ref 0.0–3.0)
Basophils Absolute: 0.1 10*3/uL (ref 0.0–0.1)
EOS PCT: 3.8 % (ref 0.0–5.0)
Eosinophils Absolute: 0.3 10*3/uL (ref 0.0–0.7)
HCT: 45.9 % (ref 39.0–52.0)
Hemoglobin: 15.3 g/dL (ref 13.0–17.0)
Lymphocytes Relative: 34.6 % (ref 12.0–46.0)
Lymphs Abs: 2.6 10*3/uL (ref 0.7–4.0)
MCHC: 33.4 g/dL (ref 30.0–36.0)
MCV: 93.2 fl (ref 78.0–100.0)
MONO ABS: 0.8 10*3/uL (ref 0.1–1.0)
Monocytes Relative: 10.6 % (ref 3.0–12.0)
Neutro Abs: 3.8 10*3/uL (ref 1.4–7.7)
Neutrophils Relative %: 50.1 % (ref 43.0–77.0)
Platelets: 261 10*3/uL (ref 150.0–400.0)
RBC: 4.92 Mil/uL (ref 4.22–5.81)
RDW: 13.7 % (ref 11.5–15.5)
WBC: 7.6 10*3/uL (ref 4.0–10.5)

## 2017-01-10 LAB — URIC ACID: Uric Acid, Serum: 4.7 mg/dL (ref 4.0–7.8)

## 2017-01-10 LAB — SEDIMENTATION RATE: SED RATE: 5 mm/h (ref 0–20)

## 2017-01-10 NOTE — Patient Instructions (Addendum)
blood tests and x-rays are requested for you today.  We'll let you know about the results.  Please call if you want to see a specialist for this.

## 2017-01-10 NOTE — Progress Notes (Signed)
   Subjective:    Patient ID: Philip Woodard, male    DOB: 24-May-1950, 66 y.o.   MRN: [id number]  HPI Pt states 10 days of moderate pain at the left ankle, and assoc pain.  No injury.   Past Medical History:  Diagnosis Date  . Alkaline phosphatase raised 06/28/2011   Fluctuating - suspect due to sarcoidosis  . Allergic rhinitis   . Genital herpes   . Hemorrhoids   . Hyperglycemia   . Hyperplastic lymph node 06/27/2011   Submental node excised 1/02; regrowth: re-excision 10/06  . Hypothyroidism   . Multinodular goiter   . Routine general medical examination at a health care facility   . Sarcoidosis   . Sleep apnea     Past Surgical History:  Procedure Laterality Date  . ROTATOR CUFF REPAIR Bilateral [id number]  . THYROIDECTOMY  2002    Social History   Social History  . Marital status: Married    Spouse name: N/A  . Number of children: N/A  . Years of education: N/A   Occupational History  . Not on file.   Social History Main Topics  . Smoking status: Never Smoker  . Smokeless tobacco: Never Used  . Alcohol use Yes     Comment: 3 times per month  . Drug use: Unknown  . Sexual activity: Not on file   Other Topics Concern  . Not on file   Social History Narrative  . No narrative on file    Current Outpatient Prescriptions on File Prior to Visit  Medication Sig Dispense Refill  . acyclovir (ZOVIRAX) 800 MG tablet TAKE 1 TABLET BY MOUTH EVERY DAY 30 tablet 2  . cephALEXin (KEFLEX) 500 MG capsule Take 1 capsule (500 mg total) by mouth 3 (three) times daily. 21 capsule 0  . diclofenac sodium (VOLTAREN) 1 % GEL Apply 4 g topically 4 (four) times daily. 100 g 11  . Fluticasone-Salmeterol (ADVAIR) 100-50 MCG/DOSE AEPB Inhale 1 puff into the lungs 2 (two) times daily. 1 each 0  . levothyroxine (SYNTHROID, LEVOTHROID) 175 MCG tablet TAKE 1 TABLET BY MOUTH DAILY 90 tablet 1  . Multiple Vitamin (MULTIVITAMIN WITH MINERALS) TABS tablet Take 1 tablet by mouth daily.    .  promethazine-codeine (PHENERGAN WITH CODEINE) 6.25-10 MG/5ML syrup Take 5 mLs by mouth every 4 (four) hours as needed. 120 mL 0   No current facility-administered medications on file prior to visit.     Allergies  Allergen Reactions  . Other     SEAFOOD    Family History  Problem Relation Age of Onset  . Asthma Cousin   . Sarcoidosis Neg Hx     BP (!) 150/88   Pulse 71   Wt 202 lb 12.8 oz (92 kg)   SpO2 96%   BMI 30.84 kg/m    Review of Systems No pain or swelling of the right ankle.  No fever    Objective:   Physical Exam VITAL SIGNS:  See vs page GENERAL: no distress Left ankle: slight diffuse swelling and tend.  No warmth/erythema      Assessment & Plan:  Ankle pain, new  Patient Instructions  blood tests and x-rays are requested for you today.  We'll let you know about the results.  Please call if you want to see a specialist for this.

## 2017-01-12 ENCOUNTER — Encounter: Payer: Self-pay | Admitting: Endocrinology

## 2017-01-12 ENCOUNTER — Other Ambulatory Visit: Payer: Self-pay | Admitting: Endocrinology

## 2017-01-12 DIAGNOSIS — M25572 Pain in left ankle and joints of left foot: Secondary | ICD-10-CM

## 2017-01-22 ENCOUNTER — Ambulatory Visit (INDEPENDENT_AMBULATORY_CARE_PROVIDER_SITE_OTHER): Payer: BLUE CROSS/BLUE SHIELD | Admitting: Family Medicine

## 2017-01-22 ENCOUNTER — Encounter: Payer: Self-pay | Admitting: Family Medicine

## 2017-01-22 VITALS — BP 146/82 | HR 72 | Temp 98.7°F | Ht 68.0 in | Wt 203.0 lb

## 2017-01-22 DIAGNOSIS — M2142 Flat foot [pes planus] (acquired), left foot: Secondary | ICD-10-CM

## 2017-01-22 DIAGNOSIS — M76822 Posterior tibial tendinitis, left leg: Secondary | ICD-10-CM | POA: Insufficient documentation

## 2017-01-22 DIAGNOSIS — M2141 Flat foot [pes planus] (acquired), right foot: Secondary | ICD-10-CM

## 2017-01-22 DIAGNOSIS — M722 Plantar fascial fibromatosis: Secondary | ICD-10-CM | POA: Diagnosis not present

## 2017-01-22 MED ORDER — DICLOFENAC SODIUM 2 % TD SOLN: 1.0000 "application " | Freq: Two times a day (BID) | TRANSDERMAL | 3 refills | Status: DC

## 2017-01-22 MED ORDER — NITROGLYCERIN 0.2 MG/HR TD PT24: MEDICATED_PATCH | TRANSDERMAL | 11 refills | Status: DC

## 2017-01-22 NOTE — Assessment & Plan Note (Signed)
He seems to have chronic changes within the tendon as well as some acute changes surrounding the tendon sheath.  It appears the insertion is intact and has an accessory navicular bone.  Would likely benefit the most from structural changes associated with custom orthotics -Referral to [facility] sports medicine for custom orthotics. -Nitro patches initiated -Provided Pennsaid -Counseled on home exercise therapy -If no improvement can consider formal physical therapy

## 2017-01-22 NOTE — Progress Notes (Addendum)
Philip Woodard - 66 y.o. male MRN [id number]  Date of birth: Dec 11, 1950  SUBJECTIVE:  Including CC & ROS.  Chief Complaint  Patient presents with  . Left ankle pain    Present for 2 weeks. Patient states he noticed the pain after he wore different shoes,denies injury. He states the pain radiates upwards to his shin. He has noticed some swelling.      Philip Woodard is a 66 y.o. male that is presenting with left medial ankle pain and plantar pain.  His symptoms have been present for 2 weeks.  He denies any changes in exercise routines or injury.  He was walking with some new shoes and the pain developed subsequently.  The pain is staying the same.  The pain is mild to moderate in nature.  Denies any history of injury or surgery to his feet.  He does have a history of flat feet and has had orthotics made about 4 years ago.  Plantar foot pain seems to be worse with the first few steps in the morning.  The medial sided left ankle pain seems to be worse with walking.  The pain on the medial aspect of his ankle is dull in nature and the bottom of his foot is sharp.  Independent review of the left ankle x-ray from 10/31 shows no acute fracture   Review of Systems  Constitutional: Negative for fever.  Musculoskeletal: Positive for gait problem.  Skin: Negative for color change.  Neurological: Negative for weakness and numbness.  Hematological: Negative for adenopathy.    HISTORY: Past Medical, Surgical, Social, and Family History Reviewed & Updated per EMR.   Pertinent Historical Findings include:  Past Medical History:  Diagnosis Date  . Alkaline phosphatase raised 06/28/2011   Fluctuating - suspect due to sarcoidosis  . Allergic rhinitis   . Genital herpes   . Hemorrhoids   . Hyperglycemia   . Hyperplastic lymph node 06/27/2011   Submental node excised 1/02; regrowth: re-excision 10/06  . Hypothyroidism   . Multinodular goiter   . Routine general medical examination at a health care  facility   . Sarcoidosis   . Sleep apnea     Past Surgical History:  Procedure Laterality Date  . ROTATOR CUFF REPAIR Bilateral [id number]  . THYROIDECTOMY  2002    Allergies  Allergen Reactions  . Other     SEAFOOD    Family History  Problem Relation Age of Onset  . Asthma Cousin   . Sarcoidosis Neg Hx      Social History   Socioeconomic History  . Marital status: Married    Spouse name: Not on file  . Number of children: Not on file  . Years of education: Not on file  . Highest education level: Not on file  Social Needs  . Financial resource strain: Not on file  . Food insecurity - worry: Not on file  . Food insecurity - inability: Not on file  . Transportation needs - medical: Not on file  . Transportation needs - non-medical: Not on file  Occupational History  . Not on file  Tobacco Use  . Smoking status: Never Smoker  . Smokeless tobacco: Never Used  Substance and Sexual Activity  . Alcohol use: Yes    Comment: 3 times per month  . Drug use: Not on file  . Sexual activity: Not on file  Other Topics Concern  . Not on file  Social History Narrative  . Not on file  PHYSICAL EXAM:  VS: BP (!) 146/82 (BP Location: Left Arm, Patient Position: Sitting, Cuff Size: Normal)   Pulse 72   Temp 98.7 F (37.1 C) (Oral)   Ht 5' 8"  (1.727 m)   Wt 203 lb (92.1 kg)   SpO2 98%   BMI 30.87 kg/m  Physical Exam Gen: NAD, alert, cooperative with exam, well-appearing ENT: normal lips, normal nasal mucosa,  Eye: normal EOM, normal conjunctiva and lids CV:  no edema, +2 pedal pulses   Resp: no accessory muscle use, non-labored,  GI: no masses or tenderness, no hernia  Skin: no rashes, no areas of induration  Neuro: normal tone, normal sensation to touch Psych:  normal insight, alert and oriented MSK:  Left ankle/foot: Swelling over the medial aspect of the left ankle. Pes planus. Shift of the subtalar joint. No significant tenderness to the medial  calcaneus. Normal plantar and dorsiflexion. Able to rise up on his tiptoes. Some reproduction of pain with resistance to inversion. Neurovascularly intact  Limited ultrasound: Left ankle/foot:  Hypoechoic change to suggest an effusion surrounding the posterior tibialis tendon.  Also chronic changes within the posterior tibialis tendon to suggest mucoid changes to suggest a tendinopathy Normal insertion into the navicular. Appears to be an accessory navicular bone  Plantar fascia appears to be normal.   Summary: Posterior tibialis tendinopathy   Ultrasound and interpretation by Clearance Coots, MD            ASSESSMENT & PLAN:   Posterior tibial tendinitis of left lower extremity He seems to have chronic changes within the tendon as well as some acute changes surrounding the tendon sheath.  It appears the insertion is intact and has an accessory navicular bone.  Would likely benefit the most from structural changes associated with custom orthotics -Referral to [facility] health sports medicine for custom orthotics. -Nitro patches initiated -Provided Pennsaid -Counseled on home exercise therapy -If no improvement can consider formal physical therapy  Plantar fasciitis The plantar foot pain seems to be associated with plantar fasciitis but no significant changes on ultrasound today -Advised midfoot arch strap -Counseled on supportive therapy and home exercise therapy

## 2017-01-22 NOTE — Patient Instructions (Signed)
Thank you for coming in,   Nitroglycerin Protocol   Apply 1/4 nitroglycerin patch to affected area daily.  Change position of patch within the affected area every 24 hours.  You may experience a headache during the first 1-2 weeks of using the patch, these should subside.  If you experience headaches after beginning nitroglycerin patch treatment, you may take your preferred over the counter pain reliever.  Another side effect of the nitroglycerin patch is skin irritation or rash related to patch adhesive.  Please notify our office if you develop more severe headaches or rash, and stop the patch.  Tendon healing with nitroglycerin patch may require 12 to 24 weeks depending on the extent of injury.  Men should not use if taking Viagra, Cialis, or Levitra.   Do not use if you have migraines or rosacea.      Please feel free to call with any questions or concerns at any time, at [phone]. --Dr. Raeford Razor

## 2017-01-22 NOTE — Assessment & Plan Note (Signed)
The plantar foot pain seems to be associated with plantar fasciitis but no significant changes on ultrasound today -Advised midfoot arch strap -Counseled on supportive therapy and home exercise therapy

## 2017-01-30 ENCOUNTER — Ambulatory Visit (INDEPENDENT_AMBULATORY_CARE_PROVIDER_SITE_OTHER): Payer: BLUE CROSS/BLUE SHIELD | Admitting: Family Medicine

## 2017-01-30 ENCOUNTER — Encounter: Payer: Self-pay | Admitting: Family Medicine

## 2017-01-30 DIAGNOSIS — M76822 Posterior tibial tendinitis, left leg: Secondary | ICD-10-CM

## 2017-01-30 MED ORDER — DICLOFENAC SODIUM 2 % TD SOLN: 1.0000 "application " | Freq: Two times a day (BID) | TRANSDERMAL | 3 refills | Status: DC

## 2017-01-30 NOTE — Patient Instructions (Addendum)
Thank you for coming in,   Please call Philip Woodard as they are waiting for a phone call for you. [phone]  Please follow up in 4-6 weeks.      Please feel free to call with any questions or concerns at any time, at [phone]. --Dr. Raeford Razor

## 2017-01-30 NOTE — Progress Notes (Signed)
Philip Woodard - 66 y.o. male MRN [id number]  Date of birth: 08/16/1950  SUBJECTIVE:  Including CC & ROS.  Chief Complaint  Patient presents with  . Follow-up    Philip Woodard is a 66 y.o. male that is following up for this left posterior tib dysfunction and pes planus. Has been using nitro patches and notes improvement of his pain. Has not received the pennsaid yet. Swelling has improved. Still has pain with getting up but otherwise .  He was seen on 11/12 and independent review of the left ankle/foot ultrasound shows posterior tibialis tendinopathy.   Independent review of the left ankle xray from 10/31 shows no acute fracture.   Review of Systems  Constitutional: Negative for fever.  Musculoskeletal: Positive for gait problem. Negative for joint swelling.  Skin: Negative for color change.  Neurological: Negative for weakness and numbness.  Hematological: Negative for adenopathy.    HISTORY: Past Medical, Surgical, Social, and Family History Reviewed & Updated per EMR.   Pertinent Historical Findings include:  Past Medical History:  Diagnosis Date  . Alkaline phosphatase raised 06/28/2011   Fluctuating - suspect due to sarcoidosis  . Allergic rhinitis   . Genital herpes   . Hemorrhoids   . Hyperglycemia   . Hyperplastic lymph node 06/27/2011   Submental node excised 1/02; regrowth: re-excision 10/06  . Hypothyroidism   . Multinodular goiter   . Routine general medical examination at a health care facility   . Sarcoidosis   . Sleep apnea     Past Surgical History:  Procedure Laterality Date  . ROTATOR CUFF REPAIR Bilateral [id number]  . THYROIDECTOMY  2002    Allergies  Allergen Reactions  . Other     SEAFOOD    Family History  Problem Relation Age of Onset  . Asthma Cousin   . Sarcoidosis Neg Hx      Social History   Socioeconomic History  . Marital status: Married    Spouse name: Not on file  . Number of children: Not on file  . Years of education: Not  on file  . Highest education level: Not on file  Social Needs  . Financial resource strain: Not on file  . Food insecurity - worry: Not on file  . Food insecurity - inability: Not on file  . Transportation needs - medical: Not on file  . Transportation needs - non-medical: Not on file  Occupational History  . Not on file  Tobacco Use  . Smoking status: Never Smoker  . Smokeless tobacco: Never Used  Substance and Sexual Activity  . Alcohol use: Yes    Comment: 3 times per month  . Drug use: Not on file  . Sexual activity: Not on file  Other Topics Concern  . Not on file  Social History Narrative  . Not on file     PHYSICAL EXAM:  VS: BP 136/80   Ht 5' 8"  (1.727 m)   BMI 30.87 kg/m  Physical Exam Gen: NAD, alert, cooperative with exam, well-appearing ENT: normal lips, normal nasal mucosa,  Eye: normal EOM, normal conjunctiva and lids CV:  no edema, +2 pedal pulses   Resp: no accessory muscle use, non-labored,  Skin: no rashes, no areas of induration  Neuro: normal tone, normal sensation to touch Psych:  normal insight, alert and oriented MSK:  Left ankle:  No swelling present Normal strength to resistance with inversion and eversion.  Pes planus b/l  Normal ankle ROM.  Plantarflexion with [place] Silos test.  Loss of transverse arch Neurovascularly intact.   Limited ultrasound: left ankle:  Improvement of the hypoechoic/mucoid change of the tendon matrix of the posterior tibialis.  Normal flexor digitorum  Normal posterior tib insertion   Summary: improving posteror tibialis tendinopathy   Ultrasound and interpretation by Clearance Coots, MD         ASSESSMENT & PLAN:   Posterior tibial tendinitis of left lower extremity Improving so far with the nitro patches.  - continue nitro  - Pennsaid should be coming in the mail  - provided thera band  - will call to schedule an appointment for custom orthotics.  - f/u in 4-6 weeks. Could consider PT

## 2017-01-30 NOTE — Assessment & Plan Note (Signed)
Improving so far with the nitro patches.  - continue nitro  - Pennsaid should be coming in the mail  - provided thera band  - will call to schedule an appointment for custom orthotics.  - f/u in 4-6 weeks. Could consider PT

## 2017-02-08 ENCOUNTER — Ambulatory Visit (INDEPENDENT_AMBULATORY_CARE_PROVIDER_SITE_OTHER): Payer: BLUE CROSS/BLUE SHIELD | Admitting: Sports Medicine

## 2017-02-08 ENCOUNTER — Encounter: Payer: Self-pay | Admitting: Sports Medicine

## 2017-02-08 VITALS — BP 150/80 | Ht 67.0 in | Wt 194.0 lb

## 2017-02-08 DIAGNOSIS — M216X1 Other acquired deformities of right foot: Secondary | ICD-10-CM | POA: Diagnosis not present

## 2017-02-08 DIAGNOSIS — M216X2 Other acquired deformities of left foot: Secondary | ICD-10-CM

## 2017-02-09 ENCOUNTER — Encounter: Payer: Self-pay | Admitting: Sports Medicine

## 2017-02-09 NOTE — Progress Notes (Signed)
   Subjective:    Patient ID: Philip Woodard, male    DOB: 01/01/1951, 66 y.o.   MRN: [id number]  HPI chief complaint: "I'm here for orthotics"  Very pleasant 66 year old male comes in today at the request of [place] for custom orthotics. He's being treated for posterior tibialis tendon insufficiency and tendinitis. He is responding well to treatment. He has some old custom orthotics which do not provide him with much arch support. In fact, he was wearing these orthotics when his pain began. He wears primarily dress shoes. Currently being treated with nitroglycerin and Pennsaid. He is scheduled to return to Dr. Raeford Razor in a month or so.  Past medical history reviewed Medications reviewed Allergies reviewed    Review of Systems As above    Objective:   Physical Exam  Well-developed, well-nourished. No acute distress. Awake alert and oriented 3. Vital signs reviewed  Examination of his feet in the standing position shows marked pes planus bilaterally. Pronation with walking. He still maintains calcaneal inversion when standing on his tiptoes. I do not see any significant soft tissue swelling. No significant tenderness to palpation. Good pulses. Neurovascularly intact distally. Walking without a limp.      Assessment & Plan:   Improving posterior tibialis tendinitis/tendinopathy Posterior tibialis tendon insufficiency Pes planus Pronation  Custom orthotics were created for the patient today. We made him a pair of dress orthotics for his dress shoes. We added additional arch support in the form of a scaphoid pad. He found them to be very comfortable prior to leaving the office. He still has a slight amount of pronation with walking but much improved. He will return to Dr. Raeford Razor as scheduled and will follow-up with [place] as needed.  Total time spent with the patient was 30 minutes with greater than 50% of the time spent in face-to-face consultation discussing orthotic construction,  instruction, and fitting.  Patient was fitted for a : standard, cushioned, semi-rigid orthotic. The orthotic was heated and afterward the patient stood on the orthotic blank positioned on the orthotic stand. The patient was positioned in subtalar neutral position and 10 degrees of ankle dorsiflexion in a weight bearing stance. After completion of molding, a stable base was applied to the orthotic blank. The blank was ground to a stable position for weight bearing. Size: 10 dress orthotic Base:  Posting: Additional orthotic padding: scaphoid pad B/L

## 2017-02-13 ENCOUNTER — Telehealth: Payer: Self-pay | Admitting: *Deleted

## 2017-02-13 NOTE — Telephone Encounter (Signed)
I called and spoke with Darnelle Maffucci from [facility]. He stated that he would call patient to see if he wanted to try another alternative. From our records it looks like Voltaren gel wasn't even ordered by [place].

## 2017-02-13 NOTE — Telephone Encounter (Signed)
Darnelle Maffucci calling from [facility] is inquiring about a Prior Josem Kaufmann that was faxed to the office on 02/05/17 for the medication Voltaren so the patient can get medication. Please advise. Thank you .

## 2017-02-16 ENCOUNTER — Encounter: Payer: Self-pay | Admitting: Endocrinology

## 2017-02-21 ENCOUNTER — Ambulatory Visit: Payer: Managed Care, Other (non HMO) | Admitting: Endocrinology

## 2017-02-28 ENCOUNTER — Ambulatory Visit: Payer: BLUE CROSS/BLUE SHIELD | Admitting: Family Medicine

## 2017-04-24 ENCOUNTER — Other Ambulatory Visit: Payer: Self-pay | Admitting: Endocrinology

## 2017-05-26 ENCOUNTER — Other Ambulatory Visit: Payer: Self-pay | Admitting: Endocrinology

## 2017-06-19 DIAGNOSIS — Z8042 Family history of malignant neoplasm of prostate: Secondary | ICD-10-CM | POA: Diagnosis not present

## 2017-06-19 DIAGNOSIS — Z1509 Genetic susceptibility to other malignant neoplasm: Secondary | ICD-10-CM | POA: Diagnosis not present

## 2017-06-19 DIAGNOSIS — Z808 Family history of malignant neoplasm of other organs or systems: Secondary | ICD-10-CM | POA: Diagnosis not present

## 2017-06-28 ENCOUNTER — Telehealth: Payer: Self-pay | Admitting: Endocrinology

## 2017-06-28 NOTE — Telephone Encounter (Signed)
Patient is requesting RX for cough medicine (the last cough medication that Dr. Loanne Drilling prescribed) sent to [facility] [address]

## 2017-07-02 NOTE — Telephone Encounter (Signed)
Ov would be needed to consider

## 2017-07-04 NOTE — Telephone Encounter (Signed)
I called and since schedule is full he stated that he may go to an urgent care if OTC cough syrup doesn't help.

## 2017-09-14 DIAGNOSIS — G4733 Obstructive sleep apnea (adult) (pediatric): Secondary | ICD-10-CM | POA: Diagnosis not present

## 2017-10-23 ENCOUNTER — Encounter: Payer: Self-pay | Admitting: Family Medicine

## 2017-10-23 ENCOUNTER — Ambulatory Visit (INDEPENDENT_AMBULATORY_CARE_PROVIDER_SITE_OTHER): Payer: Medicare HMO | Admitting: Family Medicine

## 2017-10-23 DIAGNOSIS — M76822 Posterior tibial tendinitis, left leg: Secondary | ICD-10-CM

## 2017-10-23 NOTE — Progress Notes (Signed)
Philip Woodard - 67 y.o. male MRN [id number]  Date of birth: Nov 24, 1950  SUBJECTIVE:  Including CC & ROS.  Chief Complaint  Patient presents with  . Follow-up    Philip Woodard is a 67 y.o. male that is here today for left posterior tib tendinitis. Pain has improved, he has been applying nitro patch and pennsaid daily. Standing and walking trigger mild pain. Admits to minimal swelling. He has been completing daily exercise that were provided at his last visit.   Review of Systems  Constitutional: Negative for fever.  HENT: Negative for congestion.   Musculoskeletal: Negative for gait problem.  Skin: Negative for color change.    HISTORY: Past Medical, Surgical, Social, and Family History Reviewed & Updated per EMR.   Pertinent Historical Findings include:  Past Medical History:  Diagnosis Date  . Alkaline phosphatase raised 06/28/2011   Fluctuating - suspect due to sarcoidosis  . Allergic rhinitis   . Genital herpes   . Hemorrhoids   . Hyperglycemia   . Hyperplastic lymph node 06/27/2011   Submental node excised 1/02; regrowth: re-excision 10/06  . Hypothyroidism   . Multinodular goiter   . Routine general medical examination at a health care facility   . Sarcoidosis   . Sleep apnea     Past Surgical History:  Procedure Laterality Date  . ROTATOR CUFF REPAIR Bilateral [id number]  . THYROIDECTOMY  2002    Allergies  Allergen Reactions  . Other     SEAFOOD    Family History  Problem Relation Age of Onset  . Asthma Cousin   . Sarcoidosis Neg Hx      Social History   Socioeconomic History  . Marital status: Married    Spouse name: Not on file  . Number of children: Not on file  . Years of education: Not on file  . Highest education level: Not on file  Occupational History  . Not on file  Social Needs  . Financial resource strain: Not on file  . Food insecurity:    Worry: Not on file    Inability: Not on file  . Transportation needs:    Medical: Not  on file    Non-medical: Not on file  Tobacco Use  . Smoking status: Never Smoker  . Smokeless tobacco: Never Used  Substance and Sexual Activity  . Alcohol use: Yes    Comment: 3 times per month  . Drug use: Not on file  . Sexual activity: Not on file  Lifestyle  . Physical activity:    Days per week: Not on file    Minutes per session: Not on file  . Stress: Not on file  Relationships  . Social connections:    Talks on phone: Not on file    Gets together: Not on file    Attends religious service: Not on file    Active member of club or organization: Not on file    Attends meetings of clubs or organizations: Not on file    Relationship status: Not on file  . Intimate partner violence:    Fear of current or ex partner: Not on file    Emotionally abused: Not on file    Physically abused: Not on file    Forced sexual activity: Not on file  Other Topics Concern  . Not on file  Social History Narrative  . Not on file     PHYSICAL EXAM:  VS: BP 136/64 (BP Location: Left Arm, Patient Position: Sitting,  Cuff Size: Normal)   Pulse 76   Ht 5' 7"  (1.702 m)   Wt 187 lb (84.8 kg)   SpO2 96%   BMI 29.29 kg/m  Physical Exam Gen: NAD, alert, cooperative with exam, well-appearing ENT: normal lips, normal nasal mucosa,  Eye: normal EOM, normal conjunctiva and lids CV:  no edema, +2 pedal pulses   Resp: no accessory muscle use, non-labored,  Skin: no rashes, no areas of induration  Neuro: normal tone, normal sensation to touch Psych:  normal insight, alert and oriented MSK:  Left foot/ankle: Has significant pes planus. No significant tenderness palpation over the navicular or tarsal tunnel. No abnormal swelling. Normal strength to resistance with inversion and eversion. Normal gait. Neurovascular intact     ASSESSMENT & PLAN:   Posterior tibial tendinitis of left lower extremity Posterior tib on ultrasound looks good.  Has good strength.  No significant  pain. -Discontinue nitro at this time. -Would continue exercise every so often. -Has had custom orthotics made. -Follow-up as needed.

## 2017-10-23 NOTE — Patient Instructions (Signed)
Good to see you  Please continue the exercises  Please follow up with me if you would like to have custom orthotics made  Please try the pennsaid if you have any pain  Please follow up with me if you pain returns.

## 2017-10-23 NOTE — Assessment & Plan Note (Addendum)
Posterior tib on ultrasound looks good.  Has good strength.  No significant pain. -Discontinue nitro at this time. -Would continue exercise every so often. -Has had custom orthotics made. -Follow-up as needed.

## 2017-10-26 ENCOUNTER — Telehealth: Payer: Self-pay | Admitting: Endocrinology

## 2017-10-26 NOTE — Telephone Encounter (Signed)
Patient needs new RX with refills on it for [facility] [address].

## 2017-10-29 ENCOUNTER — Other Ambulatory Visit: Payer: Self-pay

## 2017-10-29 MED ORDER — ACYCLOVIR 800 MG PO TABS: 800.0000 mg | ORAL_TABLET | Freq: Every day | ORAL | 2 refills | Status: DC

## 2017-10-29 NOTE — Telephone Encounter (Signed)
I have sent for patient.

## 2017-11-02 DIAGNOSIS — M5416 Radiculopathy, lumbar region: Secondary | ICD-10-CM | POA: Diagnosis not present

## 2017-11-02 DIAGNOSIS — M9903 Segmental and somatic dysfunction of lumbar region: Secondary | ICD-10-CM | POA: Diagnosis not present

## 2017-11-08 ENCOUNTER — Other Ambulatory Visit: Payer: Self-pay | Admitting: Endocrinology

## 2017-11-14 ENCOUNTER — Ambulatory Visit (INDEPENDENT_AMBULATORY_CARE_PROVIDER_SITE_OTHER): Payer: Medicare HMO | Admitting: Endocrinology

## 2017-11-14 VITALS — BP 152/84 | HR 77 | Ht 67.0 in | Wt 200.8 lb

## 2017-11-14 DIAGNOSIS — Z23 Encounter for immunization: Secondary | ICD-10-CM | POA: Diagnosis not present

## 2017-11-14 DIAGNOSIS — Z0000 Encounter for general adult medical examination without abnormal findings: Secondary | ICD-10-CM

## 2017-11-14 DIAGNOSIS — R7309 Other abnormal glucose: Secondary | ICD-10-CM

## 2017-11-14 DIAGNOSIS — Z125 Encounter for screening for malignant neoplasm of prostate: Secondary | ICD-10-CM

## 2017-11-14 LAB — LIPID PANEL
Cholesterol: 137 mg/dL (ref 0–200)
HDL: 49.5 mg/dL (ref >39.00)
LDL CALC: 74 mg/dL (ref 0–99)
NonHDL: 87.73
TRIGLYCERIDES: 67 mg/dL (ref 0.0–149.0)
Total CHOL/HDL Ratio: 3
VLDL: 13.4 mg/dL (ref 0.0–40.0)

## 2017-11-14 LAB — BASIC METABOLIC PANEL
BUN: 13 mg/dL (ref 6–23)
CHLORIDE: 108 meq/L (ref 96–112)
CO2: 29 meq/L (ref 19–32)
Calcium: 9.7 mg/dL (ref 8.4–10.5)
Creatinine, Ser: 1.04 mg/dL (ref 0.40–1.50)
GFR: 92.05 mL/min (ref >60.00)
Glucose, Bld: 92 mg/dL (ref 70–99)
POTASSIUM: 4.1 meq/L (ref 3.5–5.1)
SODIUM: 142 meq/L (ref 135–145)

## 2017-11-14 LAB — URINALYSIS, ROUTINE W REFLEX MICROSCOPIC
BILIRUBIN URINE: NEGATIVE
Hgb urine dipstick: NEGATIVE
Ketones, ur: NEGATIVE
Leukocytes, UA: NEGATIVE
Nitrite: NEGATIVE
PH: 7 (ref 5.0–8.0)
RBC / HPF: NONE SEEN (ref 0–?)
Specific Gravity, Urine: 1.015 (ref 1.000–1.030)
Total Protein, Urine: NEGATIVE
Urine Glucose: NEGATIVE
Urobilinogen, UA: 0.2 (ref 0.0–1.0)

## 2017-11-14 LAB — HEPATIC FUNCTION PANEL
ALK PHOS: 71 U/L (ref 39–117)
ALT: 23 U/L (ref 0–53)
AST: 34 U/L (ref 0–37)
Albumin: 4.2 g/dL (ref 3.5–5.2)
BILIRUBIN DIRECT: 0.1 mg/dL (ref 0.0–0.3)
TOTAL PROTEIN: 6.7 g/dL (ref 6.0–8.3)
Total Bilirubin: 0.6 mg/dL (ref 0.2–1.2)

## 2017-11-14 LAB — PSA: PSA: 0.86 ng/mL (ref 0.10–4.00)

## 2017-11-14 LAB — HEMOGLOBIN A1C: HEMOGLOBIN A1C: 5.7 % (ref 4.6–6.5)

## 2017-11-14 LAB — TSH: TSH: 0.27 u[IU]/mL — ABNORMAL LOW (ref 0.35–4.50)

## 2017-11-14 MED ORDER — LEVOTHYROXINE SODIUM 150 MCG PO TABS: 150.0000 ug | ORAL_TABLET | Freq: Every day | ORAL | 3 refills | Status: DC

## 2017-11-14 NOTE — Patient Instructions (Signed)
Please consider these measures for your health:  minimize alcohol.  Do not use tobacco products.  Have a colonoscopy at least every 10 years from age 67.  Keep firearms safely stored.  Always use seat belts.  have working smoke alarms in your home.  See an eye doctor and dentist regularly.  Never drive under the influence of alcohol or drugs (including prescription drugs).   blood tests are requested for you today.  We'll let you know about the results.  Best wishes with your new primary care provider.

## 2017-11-14 NOTE — Progress Notes (Signed)
Subjective:    Patient ID: Philip Woodard, male    DOB: 1950/08/07, 67 y.o.   MRN: [id number]  HPI Pt is here for regular wellness examination, and is feeling pretty well in general, and says chronic med probs are stable, except as noted below.  Past Medical History:  Diagnosis Date  . Alkaline phosphatase raised 06/28/2011   Fluctuating - suspect due to sarcoidosis  . Allergic rhinitis   . Genital herpes   . Hemorrhoids   . Hyperglycemia   . Hyperplastic lymph node 06/27/2011   Submental node excised 1/02; regrowth: re-excision 10/06  . Hypothyroidism   . Multinodular goiter   . Routine general medical examination at a health care facility   . Sarcoidosis   . Sleep apnea     Past Surgical History:  Procedure Laterality Date  . ROTATOR CUFF REPAIR Bilateral [id number]  . THYROIDECTOMY  2002    Social History   Socioeconomic History  . Marital status: Married    Spouse name: Not on file  . Number of children: Not on file  . Years of education: Not on file  . Highest education level: Not on file  Occupational History  . Not on file  Social Needs  . Financial resource strain: Not on file  . Food insecurity:    Worry: Not on file    Inability: Not on file  . Transportation needs:    Medical: Not on file    Non-medical: Not on file  Tobacco Use  . Smoking status: Never Smoker  . Smokeless tobacco: Never Used  Substance and Sexual Activity  . Alcohol use: Yes    Comment: 3 times per month  . Drug use: Not on file  . Sexual activity: Not on file  Lifestyle  . Physical activity:    Days per week: Not on file    Minutes per session: Not on file  . Stress: Not on file  Relationships  . Social connections:    Talks on phone: Not on file    Gets together: Not on file    Attends religious service: Not on file    Active member of club or organization: Not on file    Attends meetings of clubs or organizations: Not on file    Relationship status: Not on file  .  Intimate partner violence:    Fear of current or ex partner: Not on file    Emotionally abused: Not on file    Physically abused: Not on file    Forced sexual activity: Not on file  Other Topics Concern  . Not on file  Social History Narrative  . Not on file    Current Outpatient Medications on File Prior to Visit  Medication Sig Dispense Refill  . acyclovir (ZOVIRAX) 800 MG tablet Take 1 tablet (800 mg total) by mouth daily. 30 tablet 2  . Diclofenac Sodium (PENNSAID) 2 % SOLN Place 1 application 2 (two) times daily onto the skin. 1 Bottle 3  . Multiple Vitamin (MULTIVITAMIN WITH MINERALS) TABS tablet Take 1 tablet by mouth daily.     No current facility-administered medications on file prior to visit.     Allergies  Allergen Reactions  . Other     SEAFOOD    Family History  Problem Relation Age of Onset  . Asthma Cousin   . Sarcoidosis Neg Hx     BP (!) 152/84 (BP Location: Left Arm, Patient Position: Sitting, Cuff Size: Normal)   Pulse 77  Ht 5' 7"  (1.702 m)   Wt 200 lb 12.8 oz (91.1 kg)   SpO2 97%   BMI 31.45 kg/m     Review of Systems Denies fever, fatigue, visual loss, hearing loss, chest pain, sob, back pain, depression, cold intolerance, BRBPR, hematuria, syncope, decreased urinary stream, numbness, allergy sxs, easy bruising, and rash.       Objective:   Physical Exam VS: see vs page GEN: no distress HEAD: head: no deformity eyes: no periorbital swelling, no proptosis external nose and ears are normal mouth: no lesion seen NECK: supple, thyroid is not enlarged CHEST WALL: no deformity LUNGS: clear to auscultation CV: reg rate and rhythm, no murmur ABD: abdomen is soft, nontender.  no hepatosplenomegaly.  not distended.  no hernia.   MUSCULOSKELETAL: muscle bulk and strength are grossly normal.  no obvious joint swelling.  gait is normal and steady.   EXTEMITIES: no deformity.  no ulcer on the feet.  feet are of normal color and temp.  Trace bilat  leg edema.  There is bilateral onychomycosis of the toenails.   PULSES: dorsalis pedis intact bilat.  no carotid bruit.   NEURO:  cn 2-12 grossly intact.   readily moves all 4's.  sensation is intact to touch on the feet.   SKIN:  Normal texture and temperature.  No rash or suspicious lesion is visible.   NODES:  None palpable at the neck PSYCH: alert, well-oriented.  Does not appear anxious nor depressed.    I personally reviewed electrocardiogram tracing (today): Indication: wellness Impression: NSR.  No MI.  No hypertrophy. Compared to 2017: no significant change      Assessment & Plan:  Wellness visit today, with problems stable, except as noted. HTN: prob situational.  Recheck next time  Patient Instructions  Please consider these measures for your health:  minimize alcohol.  Do not use tobacco products.  Have a colonoscopy at least every 10 years from age 79.  Keep firearms safely stored.  Always use seat belts.  have working smoke alarms in your home.  See an eye doctor and dentist regularly.  Never drive under the influence of alcohol or drugs (including prescription drugs).   blood tests are requested for you today.  We'll let you know about the results.  Best wishes with your new primary care provider.

## 2017-12-24 ENCOUNTER — Other Ambulatory Visit: Payer: Self-pay

## 2017-12-24 ENCOUNTER — Emergency Department (HOSPITAL_COMMUNITY): Payer: Medicare HMO

## 2017-12-24 ENCOUNTER — Emergency Department (HOSPITAL_COMMUNITY)
Admission: EM | Admit: 2017-12-24 | Discharge: 2017-12-24 | Disposition: A | Discharge status: Home / Self Care | Payer: Medicare HMO | Attending: Emergency Medicine | Admitting: Emergency Medicine

## 2017-12-24 ENCOUNTER — Encounter (HOSPITAL_COMMUNITY): Payer: Self-pay | Admitting: Emergency Medicine

## 2017-12-24 DIAGNOSIS — I10 Essential (primary) hypertension: Secondary | ICD-10-CM | POA: Diagnosis not present

## 2017-12-24 DIAGNOSIS — Z79899 Other long term (current) drug therapy: Secondary | ICD-10-CM | POA: Insufficient documentation

## 2017-12-24 DIAGNOSIS — M79661 Pain in right lower leg: Secondary | ICD-10-CM | POA: Diagnosis not present

## 2017-12-24 DIAGNOSIS — E039 Hypothyroidism, unspecified: Secondary | ICD-10-CM | POA: Diagnosis not present

## 2017-12-24 DIAGNOSIS — M545 Low back pain, unspecified: Secondary | ICD-10-CM

## 2017-12-24 DIAGNOSIS — J45909 Unspecified asthma, uncomplicated: Secondary | ICD-10-CM | POA: Insufficient documentation

## 2017-12-24 DIAGNOSIS — S3992XA Unspecified injury of lower back, initial encounter: Secondary | ICD-10-CM | POA: Diagnosis not present

## 2017-12-24 DIAGNOSIS — S8991XA Unspecified injury of right lower leg, initial encounter: Secondary | ICD-10-CM | POA: Diagnosis not present

## 2017-12-24 DIAGNOSIS — M549 Dorsalgia, unspecified: Secondary | ICD-10-CM | POA: Diagnosis not present

## 2017-12-24 MED ORDER — ACETAMINOPHEN 500 MG PO TABS
1000.0000 mg | ORAL_TABLET | Freq: Once | ORAL | Status: AC
  Administered 2017-12-24: 1000 mg via ORAL
  Filled 2017-12-24: qty 2

## 2017-12-24 MED ORDER — METHOCARBAMOL 500 MG PO TABS: 500.0000 mg | ORAL_TABLET | Freq: Two times a day (BID) | ORAL | 0 refills | Status: DC

## 2017-12-24 NOTE — Discharge Instructions (Addendum)
The pain your experiencing is likely due to muscle strain, you may take tylenol and Robaxin as needed for pain management. Robaxin can cause drowsiness, do not take before driving. The muscle soreness should improve over the next week. Follow up with your family doctor in the next week for a recheck if you are still having symptoms. Return to ED if pain is worsening, you develop weakness or numbness of extremities, or new or concerning symptoms develop.
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
## 2017-12-24 NOTE — ED Notes (Signed)
Patient transported to X-ray 

## 2017-12-25 ENCOUNTER — Encounter: Payer: Self-pay | Admitting: Gastroenterology

## 2018-01-03 ENCOUNTER — Encounter: Payer: Self-pay | Admitting: Family

## 2018-01-03 ENCOUNTER — Ambulatory Visit (INDEPENDENT_AMBULATORY_CARE_PROVIDER_SITE_OTHER): Payer: Medicare HMO | Admitting: Family

## 2018-01-03 VITALS — BP 130/80 | HR 70 | Temp 97.9°F | Ht 67.0 in | Wt 199.0 lb

## 2018-01-03 DIAGNOSIS — M542 Cervicalgia: Secondary | ICD-10-CM

## 2018-01-03 DIAGNOSIS — M545 Low back pain, unspecified: Secondary | ICD-10-CM

## 2018-01-03 DIAGNOSIS — E039 Hypothyroidism, unspecified: Secondary | ICD-10-CM

## 2018-01-03 MED ORDER — ZOSTER VAC RECOMB ADJUVANTED 50 MCG/0.5ML IM SUSR: 0.5000 mL | Freq: Once | INTRAMUSCULAR | 1 refills | Status: AC

## 2018-01-03 MED ORDER — MELOXICAM 15 MG PO TABS: 15.0000 mg | ORAL_TABLET | Freq: Every day | ORAL | 0 refills | Status: DC
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
## 2018-01-16 DIAGNOSIS — G4733 Obstructive sleep apnea (adult) (pediatric): Secondary | ICD-10-CM | POA: Diagnosis not present

## 2018-01-20 ENCOUNTER — Other Ambulatory Visit: Payer: Self-pay | Admitting: Endocrinology

## 2018-01-20 NOTE — Telephone Encounter (Signed)
Please refill x 1 Further refills from new PCP

## 2018-01-23 ENCOUNTER — Ambulatory Visit (AMBULATORY_SURGERY_CENTER): Payer: Self-pay | Admitting: *Deleted

## 2018-01-23 VITALS — Ht 67.0 in | Wt 204.0 lb

## 2018-01-23 DIAGNOSIS — Z1211 Encounter for screening for malignant neoplasm of colon: Secondary | ICD-10-CM

## 2018-01-23 MED ORDER — NA SULFATE-K SULFATE-MG SULF 17.5-3.13-1.6 GM/177ML PO SOLN: 1.0000 | Freq: Once | ORAL | 0 refills | Status: AC

## 2018-01-23 NOTE — Progress Notes (Signed)
No egg or soy allergy known to patient  No issues with past sedation with any surgeries  or procedures, no intubation problems  No diet pills per patient No home 02 use per patient  No blood thinners per patient  Pt denies issues with constipation  No A fib or A flutter  EMMI video sent to pt's e mail

## 2018-01-24 ENCOUNTER — Encounter: Payer: Self-pay | Admitting: Gastroenterology

## 2018-01-26 ENCOUNTER — Other Ambulatory Visit: Payer: Self-pay | Admitting: Family Medicine

## 2018-01-28 NOTE — Telephone Encounter (Signed)
Please advise 

## 2018-02-04 ENCOUNTER — Encounter: Payer: Self-pay | Admitting: Gastroenterology

## 2018-02-04 ENCOUNTER — Ambulatory Visit (AMBULATORY_SURGERY_CENTER): Payer: Medicare HMO | Admitting: Gastroenterology

## 2018-02-04 VITALS — BP 147/64 | HR 81 | Temp 99.5°F | Resp 19 | Ht 67.0 in | Wt 204.0 lb

## 2018-02-04 DIAGNOSIS — D125 Benign neoplasm of sigmoid colon: Secondary | ICD-10-CM | POA: Diagnosis not present

## 2018-02-04 DIAGNOSIS — Z1211 Encounter for screening for malignant neoplasm of colon: Secondary | ICD-10-CM

## 2018-02-04 HISTORY — PX: COLONOSCOPY: SHX174

## 2018-02-04 MED ORDER — SODIUM CHLORIDE 0.9 % IV SOLN: 500.0000 mL | Freq: Once | INTRAVENOUS | Status: DC

## 2018-02-04 NOTE — Patient Instructions (Signed)
Handouts Provided:  Polyps  No aspirin, ibuprofen, naproxen, or other non-steroidal anti-inflammatory drugs for 2 weeks after polyp removal.   YOU HAD AN ENDOSCOPIC PROCEDURE TODAY AT [facility]:   Refer to the procedure report that was given to you for any specific questions about what was found during the examination.  If the procedure report does not answer your questions, please call your gastroenterologist to clarify.  If you requested that your care partner not be given the details of your procedure findings, then the procedure report has been included in a sealed envelope for you to review at your convenience later.  YOU SHOULD EXPECT: Some feelings of bloating in the abdomen. Passage of more gas than usual.  Walking can help get rid of the air that was put into your GI tract during the procedure and reduce the bloating. If you had a lower endoscopy (such as a colonoscopy or flexible sigmoidoscopy) you may notice spotting of blood in your stool or on the toilet paper. If you underwent a bowel prep for your procedure, you may not have a normal bowel movement for a few days.  Please Note:  You might notice some irritation and congestion in your nose or some drainage.  This is from the oxygen used during your procedure.  There is no need for concern and it should clear up in a day or so.  SYMPTOMS TO REPORT IMMEDIATELY:   Following lower endoscopy (colonoscopy or flexible sigmoidoscopy):  Excessive amounts of blood in the stool  Significant tenderness or worsening of abdominal pains  Swelling of the abdomen that is new, acute  Fever of 100F or higher  For urgent or emergent issues, a gastroenterologist can be reached at any hour by calling [phone].   DIET:  We do recommend a small meal at first, but then you may proceed to your regular diet.  Drink plenty of fluids but you should avoid alcoholic beverages for 24 hours.  ACTIVITY:  You should plan to take it easy  for the rest of today and you should NOT DRIVE or use heavy machinery until tomorrow (because of the sedation medicines used during the test).    FOLLOW UP: Our staff will call the number listed on your records the next business day following your procedure to check on you and address any questions or concerns that you may have regarding the information given to you following your procedure. If we do not reach you, we will leave a message.  However, if you are feeling well and you are not experiencing any problems, there is no need to return our call.  We will assume that you have returned to your regular daily activities without incident.  If any biopsies were taken you will be contacted by phone or by letter within the next 1-3 weeks.  Please call us at [phone] if you have not heard about the biopsies in 3 weeks.    SIGNATURES/CONFIDENTIALITY: You and/or your care partner have signed paperwork which will be entered into your electronic medical record.  These signatures attest to the fact that that the information above on your After Visit Summary has been reviewed and is understood.  Full responsibility of the confidentiality of this discharge information lies with you and/or your care-partner.

## 2018-02-04 NOTE — Progress Notes (Signed)
Pt's states no medical or surgical changes since previsit or office visit. 

## 2018-02-04 NOTE — Op Note (Signed)
[place] Patient Name: Newel Oien Procedure Date: 02/04/2018 2:37 PM MRN: [id number] Endoscopist: Ladene Artist , MD Age: 67 Referring MD:  Date of Birth: 08-21-1950 Gender: Male Account #: [id number] Procedure:                Colonoscopy Indications:              Screening for colorectal malignant neoplasm Medicines:                Monitored Anesthesia Care Procedure:                Pre-Anesthesia Assessment:                           - Prior to the procedure, a History and Physical                            was performed, and patient medications and                            allergies were reviewed. The patient's tolerance of                            previous anesthesia was also reviewed. The risks                            and benefits of the procedure and the sedation                            options and risks were discussed with the patient.                            All questions were answered, and informed consent                            was obtained. Prior Anticoagulants: The patient has                            taken no previous anticoagulant or antiplatelet                            agents. ASA Grade Assessment: II - A patient with                            mild systemic disease. After reviewing the risks                            and benefits, the patient was deemed in                            satisfactory condition to undergo the procedure.                           After obtaining informed consent, the colonoscope  was passed under direct vision. Throughout the                            procedure, the patient's blood pressure, pulse, and                            oxygen saturations were monitored continuously. The                            Colonoscope was introduced through the anus and                            advanced to the the cecum, identified by                            appendiceal orifice and  ileocecal valve. The                            ileocecal valve, appendiceal orifice, and rectum                            were photographed. The quality of the bowel                            preparation was adequate. The colonoscopy was                            performed without difficulty. The patient tolerated                            the procedure well. Scope In: 2:46:05 PM Scope Out: 3:05:35 PM Scope Withdrawal Time: 0 hours 16 minutes 14 seconds  Total Procedure Duration: 0 hours 19 minutes 30 seconds  Findings:                 The perianal and digital rectal examinations                            revealed external hemorrhoids and tags. Otherwise                            normal.                           A 10 mm polyp was found in the sigmoid colon. The                            polyp was pedunculated. The polyp was removed with                            a hot snare. Resection and retrieval were complete.                           External and internal hemorrhoids were found during  retroflexion. The hemorrhoids were medium-sized and                            Grade I (internal hemorrhoids that do not prolapse).                           The exam was otherwise without abnormality on                            direct and retroflexion views. Complications:            No immediate complications. Estimated blood loss:                            None. Estimated Blood Loss:     Estimated blood loss: none. Impression:               - One 10 mm polyp in the sigmoid colon, removed                            with a hot snare. Resected and retrieved.                           - External and internal hemorrhoids.                           - The examination was otherwise normal on direct                            and retroflexion views. Recommendation:           - Repeat colonoscopy in 3 years for surveillance if                            polyp is  precancerous, otherwise 10 years.                           - Patient has a contact number available for                            emergencies. The signs and symptoms of potential                            delayed complications were discussed with the                            patient. Return to normal activities tomorrow.                            Written discharge instructions were provided to the                            patient.                           - Resume previous diet.                           -  Continue present medications.                           - Await pathology results.                           - No aspirin, ibuprofen, naproxen, or other                            non-steroidal anti-inflammatory drugs for 2 weeks                            after polyp removal. Ladene Artist, MD 02/04/2018 3:09:34 PM This report has been signed electronically.

## 2018-02-04 NOTE — Progress Notes (Signed)
PT taken to PACU. Monitors in place. VSS. Report given to RN. 

## 2018-02-04 NOTE — Progress Notes (Signed)
Called to room to assist during endoscopic procedure.  Patient ID and intended procedure confirmed with present staff. Received instructions for my participation in the procedure from the performing physician.  

## 2018-02-05 ENCOUNTER — Telehealth: Payer: Self-pay | Admitting: *Deleted

## 2018-02-05 NOTE — Telephone Encounter (Signed)
  Follow up Call-  Call back number 02/04/2018  Post procedure Call Back phone  # [phone]  Permission to leave phone message Yes  Some recent data might be hidden     Patient questions:  Do you have a fever, pain , or abdominal swelling? No. Pain Score  0 *  Have you tolerated food without any problems? Yes.    Have you been able to return to your normal activities? Yes.    Do you have any questions about your discharge instructions: Diet   No. Medications  No. Follow up visit  No.  Do you have questions or concerns about your Care? No.  Actions: * If pain score is 4 or above: No action needed, pain <4.

## 2018-02-15 ENCOUNTER — Other Ambulatory Visit: Payer: Self-pay | Admitting: Endocrinology

## 2018-02-15 ENCOUNTER — Encounter: Payer: Self-pay | Admitting: Gastroenterology

## 2018-02-15 NOTE — Telephone Encounter (Signed)
Ok to refill 

## 2018-02-15 NOTE — Telephone Encounter (Signed)
Please refill x 3 months Further refills would have to be considered by new PCP   

## 2018-02-23 ENCOUNTER — Other Ambulatory Visit: Payer: Self-pay | Admitting: Family Medicine

## 2018-02-26 NOTE — Telephone Encounter (Signed)
This came to me electronically.

## 2018-02-26 NOTE — Telephone Encounter (Signed)
Please advise 

## 2018-02-26 NOTE — Telephone Encounter (Signed)
Does he need this or did pharmacy just send refill request?

## 2018-02-26 NOTE — Telephone Encounter (Signed)
Called and left message for patient to return call to clinic to see if he needed it refilled or not.

## 2018-03-04 ENCOUNTER — Ambulatory Visit: Payer: Medicare HMO | Admitting: Family

## 2018-03-17 ENCOUNTER — Other Ambulatory Visit: Payer: Self-pay | Admitting: Endocrinology

## 2018-03-18 NOTE — Telephone Encounter (Signed)
Please refer request to new PCP

## 2018-05-10 ENCOUNTER — Other Ambulatory Visit: Payer: Self-pay | Admitting: Endocrinology

## 2018-05-10 DIAGNOSIS — M9903 Segmental and somatic dysfunction of lumbar region: Secondary | ICD-10-CM | POA: Diagnosis not present

## 2018-05-10 DIAGNOSIS — M5136 Other intervertebral disc degeneration, lumbar region: Secondary | ICD-10-CM | POA: Diagnosis not present

## 2018-05-10 DIAGNOSIS — M5416 Radiculopathy, lumbar region: Secondary | ICD-10-CM | POA: Diagnosis not present

## 2018-06-20 ENCOUNTER — Other Ambulatory Visit: Payer: Self-pay | Admitting: Endocrinology

## 2018-08-14 DIAGNOSIS — G4733 Obstructive sleep apnea (adult) (pediatric): Secondary | ICD-10-CM | POA: Diagnosis not present

## 2018-11-15 ENCOUNTER — Other Ambulatory Visit: Payer: Self-pay | Admitting: Endocrinology

## 2018-12-11 ENCOUNTER — Other Ambulatory Visit: Payer: Self-pay

## 2018-12-11 ENCOUNTER — Encounter: Payer: Self-pay | Admitting: Family

## 2018-12-11 ENCOUNTER — Ambulatory Visit (INDEPENDENT_AMBULATORY_CARE_PROVIDER_SITE_OTHER): Payer: Medicare HMO | Admitting: Family

## 2018-12-11 VITALS — BP 120/78 | HR 68 | Temp 98.4°F | Ht 67.0 in | Wt 201.0 lb

## 2018-12-11 DIAGNOSIS — Z23 Encounter for immunization: Secondary | ICD-10-CM | POA: Diagnosis not present

## 2018-12-11 DIAGNOSIS — E559 Vitamin D deficiency, unspecified: Secondary | ICD-10-CM | POA: Diagnosis not present

## 2018-12-11 DIAGNOSIS — Z125 Encounter for screening for malignant neoplasm of prostate: Secondary | ICD-10-CM | POA: Diagnosis not present

## 2018-12-11 DIAGNOSIS — Z13220 Encounter for screening for lipoid disorders: Secondary | ICD-10-CM | POA: Diagnosis not present

## 2018-12-11 DIAGNOSIS — Z0000 Encounter for general adult medical examination without abnormal findings: Secondary | ICD-10-CM

## 2018-12-11 DIAGNOSIS — E039 Hypothyroidism, unspecified: Secondary | ICD-10-CM | POA: Diagnosis not present

## 2018-12-11 MED ORDER — ACYCLOVIR 800 MG PO TABS: 800.0000 mg | ORAL_TABLET | Freq: Every day | ORAL | 3 refills | Status: DC

## 2018-12-11 MED ORDER — TAMSULOSIN HCL 0.4 MG PO CAPS: 0.4000 mg | ORAL_CAPSULE | Freq: Every day | ORAL | 0 refills | Status: DC

## 2018-12-11 NOTE — Patient Instructions (Signed)
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
## 2018-12-11 NOTE — Addendum Note (Signed)
Addended by: Marcina Millard on: 12/11/2018 11:14 AM   Modules accepted: Orders

## 2018-12-12 ENCOUNTER — Emergency Department (HOSPITAL_COMMUNITY)
Admission: EM | Admit: 2018-12-12 | Discharge: 2018-12-12 | Disposition: A | Discharge status: Home / Self Care | Payer: Medicare HMO | Attending: Emergency Medicine | Admitting: Emergency Medicine

## 2018-12-12 ENCOUNTER — Encounter (HOSPITAL_COMMUNITY): Payer: Self-pay | Admitting: Emergency Medicine

## 2018-12-12 ENCOUNTER — Other Ambulatory Visit: Payer: Self-pay

## 2018-12-12 ENCOUNTER — Other Ambulatory Visit (INDEPENDENT_AMBULATORY_CARE_PROVIDER_SITE_OTHER): Payer: Medicare HMO

## 2018-12-12 ENCOUNTER — Ambulatory Visit: Payer: Self-pay

## 2018-12-12 DIAGNOSIS — Y999 Unspecified external cause status: Secondary | ICD-10-CM | POA: Insufficient documentation

## 2018-12-12 DIAGNOSIS — Z79899 Other long term (current) drug therapy: Secondary | ICD-10-CM | POA: Diagnosis not present

## 2018-12-12 DIAGNOSIS — Y9384 Activity, sleeping: Secondary | ICD-10-CM | POA: Insufficient documentation

## 2018-12-12 DIAGNOSIS — E039 Hypothyroidism, unspecified: Secondary | ICD-10-CM | POA: Diagnosis not present

## 2018-12-12 DIAGNOSIS — Z125 Encounter for screening for malignant neoplasm of prostate: Secondary | ICD-10-CM | POA: Diagnosis not present

## 2018-12-12 DIAGNOSIS — Y92013 Bedroom of single-family (private) house as the place of occurrence of the external cause: Secondary | ICD-10-CM | POA: Diagnosis not present

## 2018-12-12 DIAGNOSIS — S161XXA Strain of muscle, fascia and tendon at neck level, initial encounter: Secondary | ICD-10-CM

## 2018-12-12 DIAGNOSIS — W19XXXA Unspecified fall, initial encounter: Secondary | ICD-10-CM

## 2018-12-12 DIAGNOSIS — W06XXXA Fall from bed, initial encounter: Secondary | ICD-10-CM | POA: Insufficient documentation

## 2018-12-12 DIAGNOSIS — S0083XA Contusion of other part of head, initial encounter: Secondary | ICD-10-CM | POA: Insufficient documentation

## 2018-12-12 DIAGNOSIS — Z13220 Encounter for screening for lipoid disorders: Secondary | ICD-10-CM | POA: Diagnosis not present

## 2018-12-12 DIAGNOSIS — E559 Vitamin D deficiency, unspecified: Secondary | ICD-10-CM | POA: Diagnosis not present

## 2018-12-12 LAB — COMPREHENSIVE METABOLIC PANEL
ALT: 24 U/L (ref 0–53)
AST: 35 U/L (ref 0–37)
Albumin: 4.4 g/dL (ref 3.5–5.2)
Alkaline Phosphatase: 81 U/L (ref 39–117)
BUN: 12 mg/dL (ref 6–23)
CO2: 29 mEq/L (ref 19–32)
Calcium: 9.4 mg/dL (ref 8.4–10.5)
Chloride: 104 mEq/L (ref 96–112)
Creatinine, Ser: 1.12 mg/dL (ref 0.40–1.50)
GFR: 79.25 mL/min (ref >60.00)
Glucose, Bld: 105 mg/dL — ABNORMAL HIGH (ref 70–99)
Potassium: 3.9 mEq/L (ref 3.5–5.1)
Sodium: 141 mEq/L (ref 135–145)
Total Bilirubin: 0.9 mg/dL (ref 0.2–1.2)
Total Protein: 7.1 g/dL (ref 6.0–8.3)

## 2018-12-12 LAB — CBC WITH DIFFERENTIAL/PLATELET
Basophils Absolute: 0.1 10*3/uL (ref 0.0–0.1)
Basophils Relative: 0.6 % (ref 0.0–3.0)
Eosinophils Absolute: 0.3 10*3/uL (ref 0.0–0.7)
Eosinophils Relative: 3.4 % (ref 0.0–5.0)
HCT: 44.9 % (ref 39.0–52.0)
Hemoglobin: 15 g/dL (ref 13.0–17.0)
Lymphocytes Relative: 24.2 % (ref 12.0–46.0)
Lymphs Abs: 2.3 10*3/uL (ref 0.7–4.0)
MCHC: 33.3 g/dL (ref 30.0–36.0)
MCV: 90 fl (ref 78.0–100.0)
Monocytes Absolute: 1 10*3/uL (ref 0.1–1.0)
Monocytes Relative: 10.9 % (ref 3.0–12.0)
Neutro Abs: 5.7 10*3/uL (ref 1.4–7.7)
Neutrophils Relative %: 60.9 % (ref 43.0–77.0)
Platelets: 254 10*3/uL (ref 150.0–400.0)
RBC: 4.99 Mil/uL (ref 4.22–5.81)
RDW: 14.8 % (ref 11.5–15.5)
WBC: 9.3 10*3/uL (ref 4.0–10.5)

## 2018-12-12 LAB — LIPID PANEL
Cholesterol: 142 mg/dL (ref 0–200)
HDL: 46.2 mg/dL (ref >39.00)
LDL Cholesterol: 86 mg/dL (ref 0–99)
NonHDL: 95.79
Total CHOL/HDL Ratio: 3
Triglycerides: 48 mg/dL (ref 0.0–149.0)
VLDL: 9.6 mg/dL (ref 0.0–40.0)

## 2018-12-12 LAB — TSH: TSH: 2.99 u[IU]/mL (ref 0.35–4.50)

## 2018-12-12 LAB — PSA, MEDICARE: PSA: 0.92 ng/ml (ref 0.10–4.00)

## 2018-12-12 LAB — VITAMIN D 25 HYDROXY (VIT D DEFICIENCY, FRACTURES): VITD: 27.77 ng/mL — ABNORMAL LOW (ref 30.00–100.00)

## 2018-12-12 MED ORDER — CYCLOBENZAPRINE HCL 10 MG PO TABS: 10.0000 mg | ORAL_TABLET | Freq: Two times a day (BID) | ORAL | 0 refills | Status: DC | PRN

## 2018-12-12 MED ORDER — IBUPROFEN 200 MG PO TABS
600.0000 mg | ORAL_TABLET | Freq: Once | ORAL | Status: AC
  Administered 2018-12-12: 600 mg via ORAL
  Filled 2018-12-12: qty 3
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
## 2018-12-12 NOTE — ED Triage Notes (Signed)
Pt reports that he fell/rolled  out of bed this morning around 2am. C/o pains in top of head. Denies LOC or taking blood thinners.

## 2018-12-12 NOTE — Telephone Encounter (Signed)
Pt. Reports he fell out of bed last night and hit left side of face and temple. No loss of consciousness. No swelling or bruising or lacerations. Does have "some numbness to my face where I hit." Complaining of headache and neck pain. [place] in the practice informed and pt. Will go to ED as recommended.  Answer Assessment - Initial Assessment Questions 1. MECHANISM: "How did the injury happen?" For falls, ask: "What height did you fall from?" and "What surface did you fall against?"      Fell yesterday at home 2. ONSET: "When did the injury happen?" (Minutes or hours ago)      Yesterday 3. NEUROLOGIC SYMPTOMS: "Was there any loss of consciousness?" "Are there any other neurological symptoms?"      No 4. MENTAL STATUS: "Does the person know who he is, who you are, and where he is?"      Oriented 5. LOCATION: "What part of the head was hit?"      Left side - face and temple 6. SCALP APPEARANCE: "What does the scalp look like? Is it bleeding now?" If so, ask: "Is it difficult to stop?"      No cuts 7. SIZE: For cuts, bruises, or swelling, ask: "How large is it?" (e.g., inches or centimeters)      No cuts 8. PAIN: "Is there any pain?" If so, ask: "How bad is it?"  (e.g., Scale 1-10; or mild, moderate, severe)     7 9. TETANUS: For any breaks in the skin, ask: "When was the last tetanus booster?"     Unsure 10. OTHER SYMPTOMS: "Do you have any other symptoms?" (e.g., neck pain, vomiting)       Neck pain 11. PREGNANCY: "Is there any chance you are pregnant?" "When was your last menstrual period?"       n/a  Protocols used: HEAD INJURY-A-AH

## 2018-12-13 ENCOUNTER — Encounter: Payer: Self-pay | Admitting: Family

## 2018-12-13 ENCOUNTER — Other Ambulatory Visit: Payer: Self-pay | Admitting: Family

## 2018-12-13 MED ORDER — LEVOTHYROXINE SODIUM 175 MCG PO TABS: 175.0000 ug | ORAL_TABLET | Freq: Every day | ORAL | 1 refills | Status: DC

## 2019-03-03 ENCOUNTER — Other Ambulatory Visit: Payer: Self-pay | Admitting: Family

## 2019-03-04 IMAGING — DX DG ANKLE COMPLETE 3+V*L*
3 series · 3 of 3 positions shown · non-contrast
Comparison: None.

CLINICAL DATA: Left ankle pain and swelling at medial malleolus. No
known injury.

EXAM:
LEFT ANKLE COMPLETE - 3+ VIEW

[dg ankle complete left (1 of 3)]
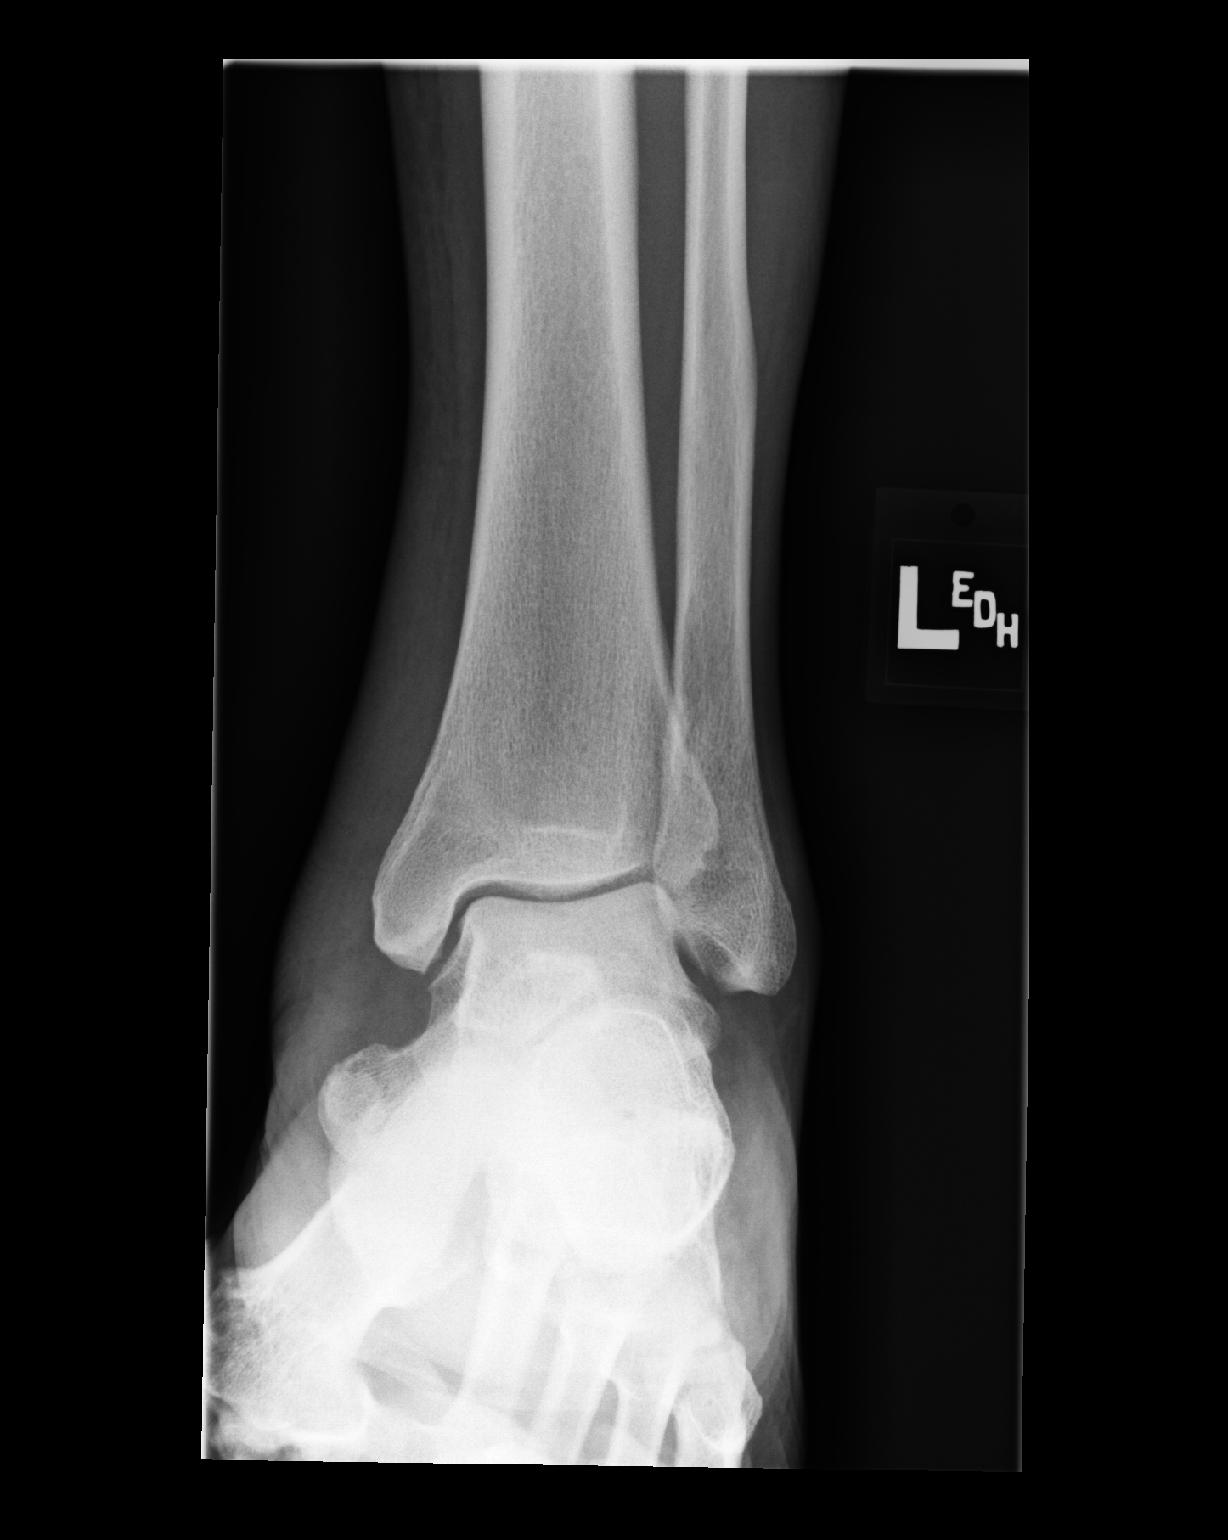

[dg ankle complete left (2 of 3)]
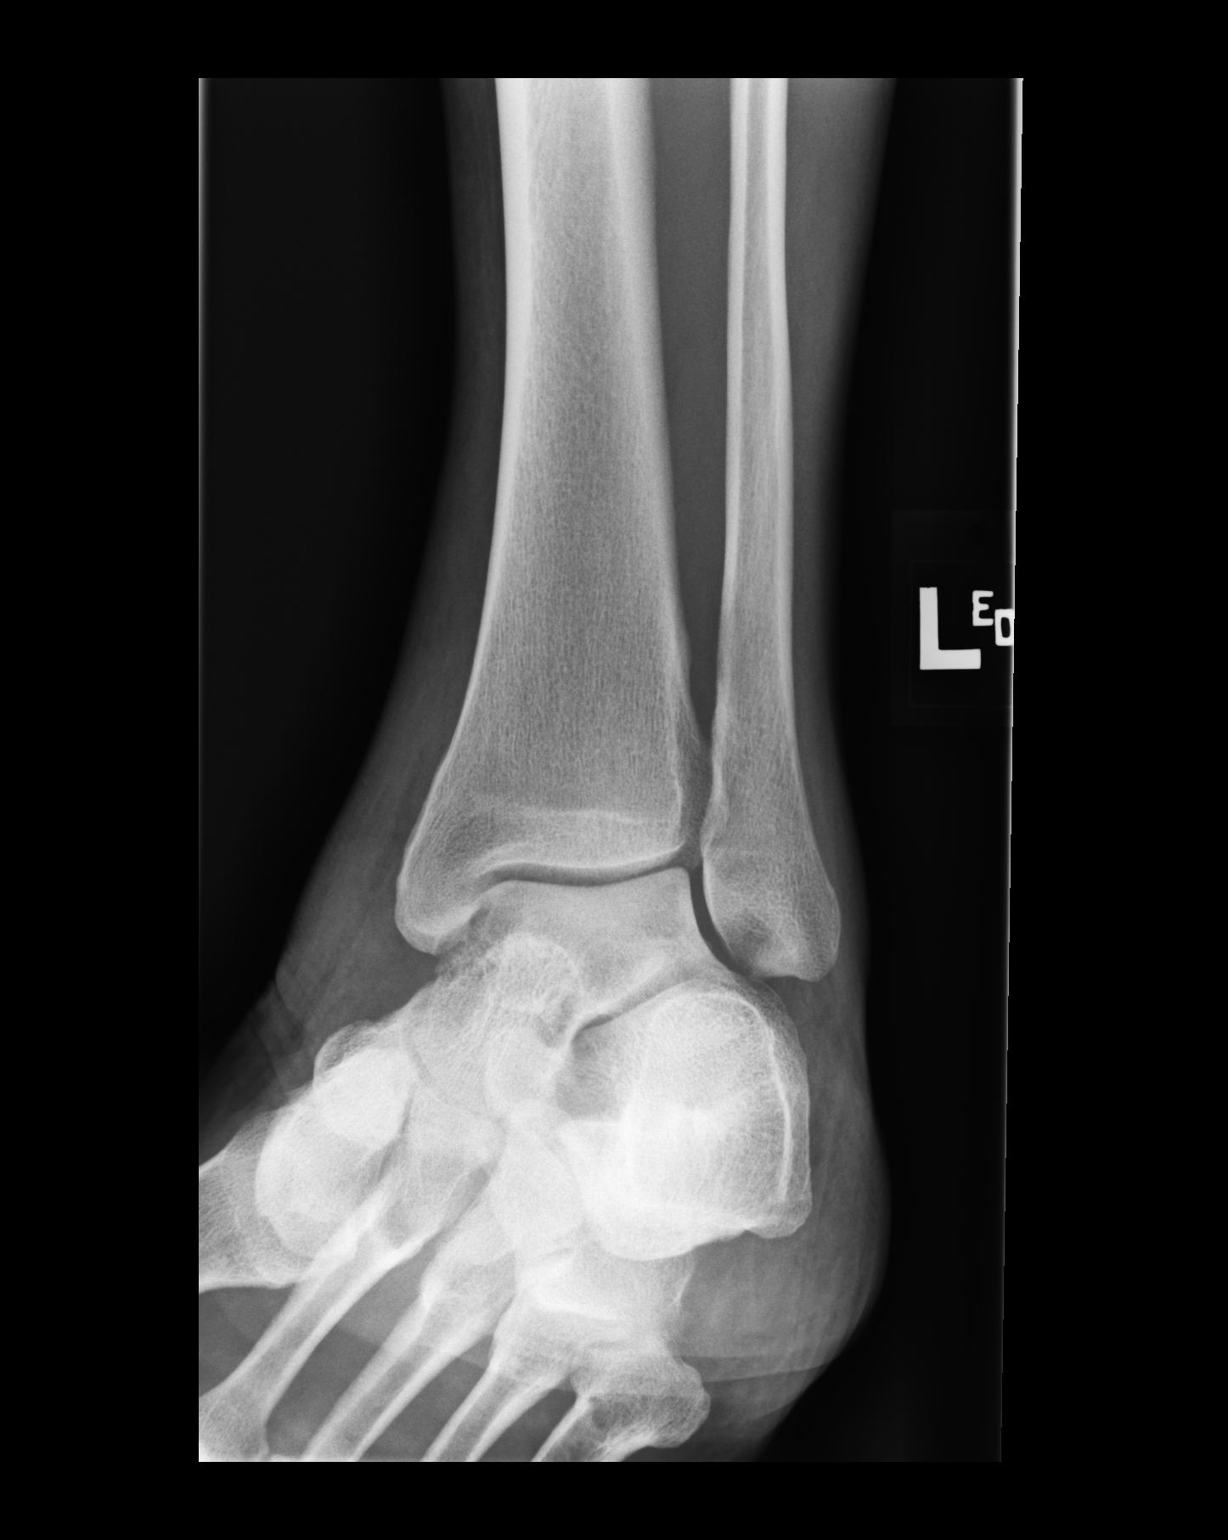

[dg ankle complete left (3 of 3)]
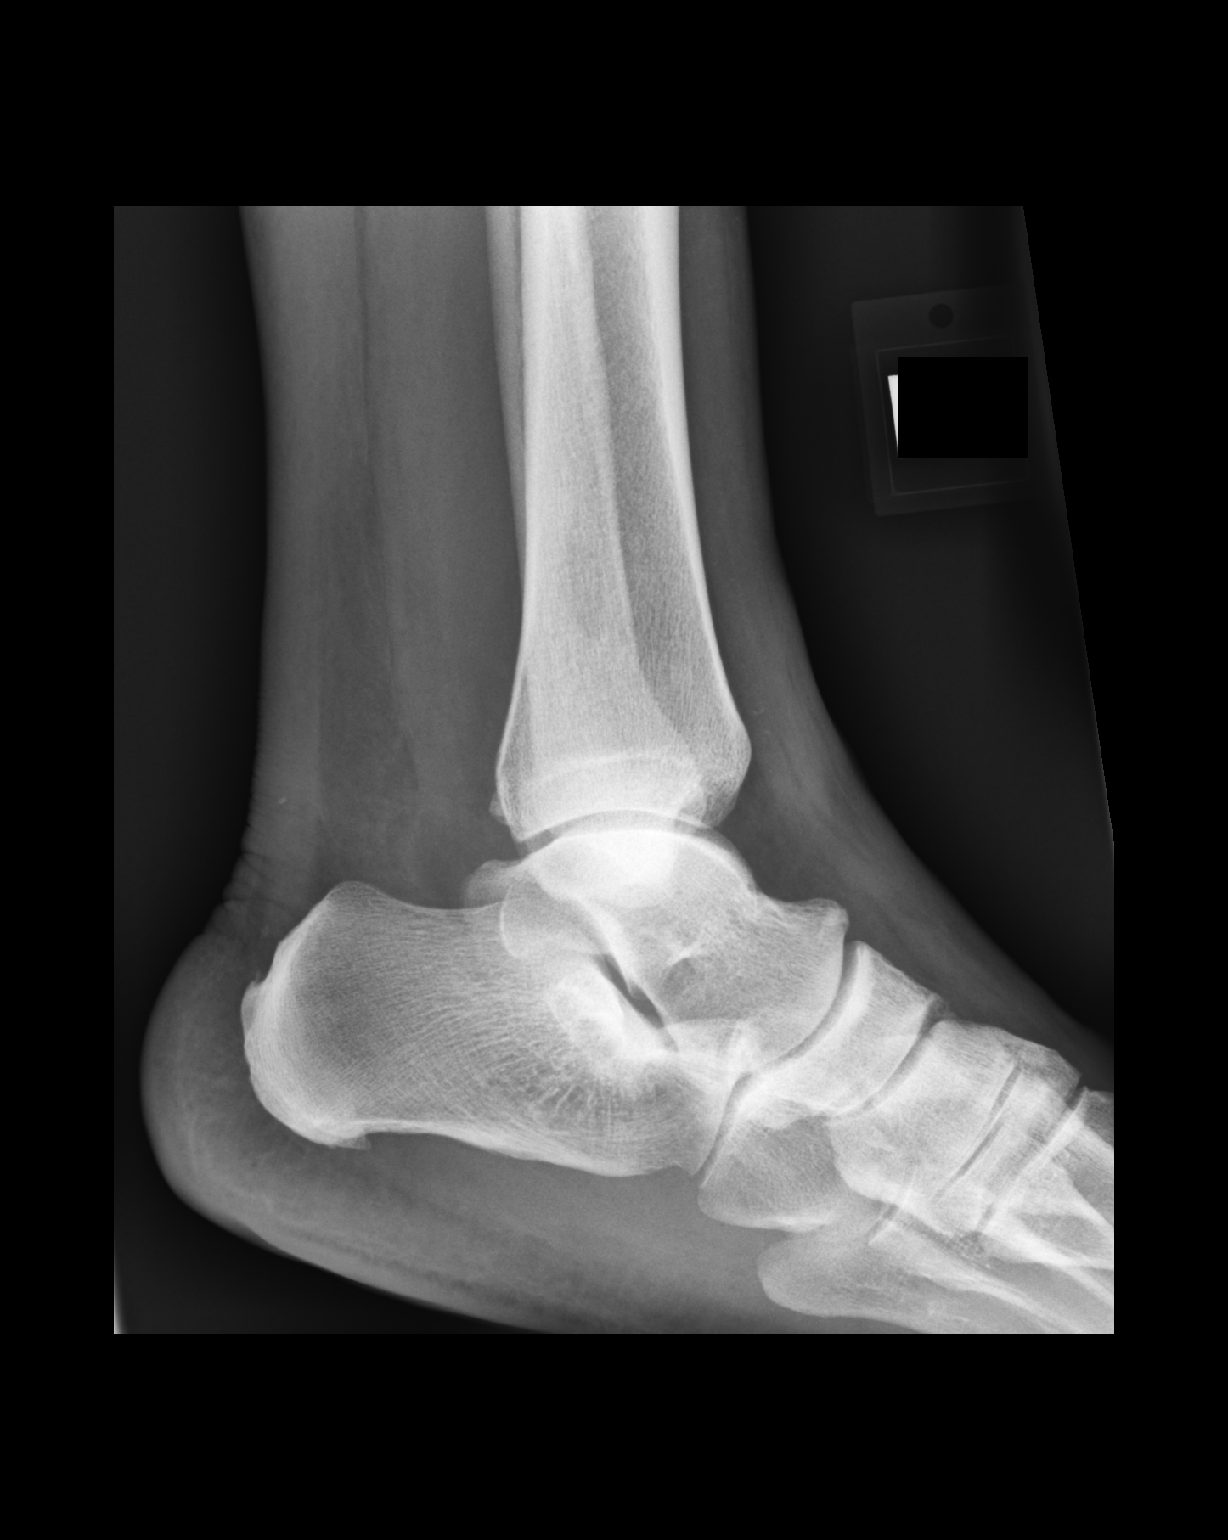

[3 of 3 positions shown; findings below may reference images not displayed]

FINDINGS: Soft tissue swelling medially. No underlying bony abnormality. No
fracture, subluxation or dislocation. Joint spaces are maintained.
IMPRESSION: Medial soft tissue swelling.  No acute bony abnormality.

## 2019-03-23 ENCOUNTER — Encounter: Payer: Self-pay | Admitting: Family

## 2019-04-01 DIAGNOSIS — Z20828 Contact with and (suspected) exposure to other viral communicable diseases: Secondary | ICD-10-CM | POA: Diagnosis not present

## 2019-05-19 DIAGNOSIS — R69 Illness, unspecified: Secondary | ICD-10-CM | POA: Diagnosis not present

## 2019-05-21 DIAGNOSIS — R69 Illness, unspecified: Secondary | ICD-10-CM | POA: Diagnosis not present

## 2019-06-01 ENCOUNTER — Other Ambulatory Visit: Payer: Self-pay | Admitting: Family

## 2019-07-01 DIAGNOSIS — G4733 Obstructive sleep apnea (adult) (pediatric): Secondary | ICD-10-CM | POA: Diagnosis not present

## 2019-07-31 DIAGNOSIS — G4733 Obstructive sleep apnea (adult) (pediatric): Secondary | ICD-10-CM | POA: Diagnosis not present

## 2019-08-06 ENCOUNTER — Other Ambulatory Visit: Payer: Self-pay

## 2019-08-06 ENCOUNTER — Encounter: Payer: Self-pay | Admitting: Family

## 2019-08-06 ENCOUNTER — Ambulatory Visit (INDEPENDENT_AMBULATORY_CARE_PROVIDER_SITE_OTHER): Payer: Medicare HMO | Admitting: Family

## 2019-08-06 VITALS — BP 140/80 | HR 81 | Temp 98.1°F | Ht 67.0 in | Wt 194.8 lb

## 2019-08-06 DIAGNOSIS — S99921A Unspecified injury of right foot, initial encounter: Secondary | ICD-10-CM

## 2019-08-06 MED ORDER — SULFAMETHOXAZOLE-TRIMETHOPRIM 800-160 MG PO TABS
1.0000 | ORAL_TABLET | Freq: Two times a day (BID) | ORAL | 0 refills | Status: DC
Start: 2019-08-06 — End: 2019-09-10
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
## 2019-08-08 ENCOUNTER — Ambulatory Visit: Payer: Medicare HMO | Admitting: Podiatry

## 2019-08-08 ENCOUNTER — Other Ambulatory Visit: Payer: Self-pay

## 2019-08-08 DIAGNOSIS — L600 Ingrowing nail: Secondary | ICD-10-CM

## 2019-08-14 ENCOUNTER — Encounter: Payer: Self-pay | Admitting: Podiatry

## 2019-08-14 NOTE — Progress Notes (Signed)
Subjective:  Patient ID: Philip Woodard, male    DOB: 05-24-1950,  MRN: [id number]  Chief Complaint  Patient presents with  . Nail Problem    pt is here for a nail splitting off, pt states that right great toenail medial side, pt states it has been going on for about 2 weeks and is looking to have it looked at    69 y.o. male presents with the above complaint.  Patient presents with right medial hallux ingrown that has been causing him a lot of pain.  There is no splitting down the nail that is been hurting.  Patient stated none for 2 weeks there is pain on pressure.  Patient has been taking his vitamins.  There is tenderness to it.  No oozing or drainage noted.  He would like to have it removed.  He has not seen anyone else prior to seeing me.   Review of Systems: Negative except as noted in the HPI. Denies N/V/F/Ch.  Past Medical History:  Diagnosis Date  . Alkaline phosphatase raised 06/28/2011   Fluctuating - suspect due to sarcoidosis  . Allergic rhinitis   . Allergy   . Arthritis    knees  . Genital herpes   . Hemorrhoids   . Hyperglycemia   . Hyperplastic lymph node 06/27/2011   Submental node excised 1/02; regrowth: re-excision 10/06  . Hypothyroidism   . Multinodular goiter   . Routine general medical examination at a health care facility   . Sarcoidosis   . Sleep apnea    wears cpap     Current Outpatient Medications:  .  acetaminophen (TYLENOL) 650 MG CR tablet, Take 650 mg by mouth every 8 (eight) hours as needed., Disp: , Rfl:  .  acyclovir (ZOVIRAX) 800 MG tablet, Take 1 tablet (800 mg total) by mouth daily., Disp: 90 tablet, Rfl: 3 .  cholecalciferol (VITAMIN D3) 25 MCG (1000 UT) tablet, Take 1,000 Units by mouth daily., Disp: , Rfl:  .  cyclobenzaprine (FLEXERIL) 10 MG tablet, Take 1 tablet (10 mg total) by mouth 2 (two) times daily as needed for muscle spasms., Disp: 10 tablet, Rfl: 0 .  Glucosamine-Chondroit-Vit C-Mn (GLUCOSAMINE 1500 COMPLEX) CAPS, Take by  mouth., Disp: , Rfl:  .  levothyroxine (SYNTHROID) 175 MCG tablet, TAKE 1 TABLET BY MOUTH EVERY DAY, Disp: 90 tablet, Rfl: 1 .  Multiple Vitamin (MULTIVITAMIN WITH MINERALS) TABS tablet, Take 1 tablet by mouth daily., Disp: , Rfl:  .  Omega-3 Fatty Acids (FISH OIL) 1000 MG CAPS, Take 2 capsules by mouth., Disp: , Rfl:  .  sulfamethoxazole-trimethoprim (BACTRIM DS) 800-160 MG tablet, Take 1 tablet by mouth 2 (two) times daily., Disp: 10 tablet, Rfl: 0 .  tamsulosin (FLOMAX) 0.4 MG CAPS capsule, TAKE 1 CAPSULE BY MOUTH EVERY DAY, Disp: 90 capsule, Rfl: 0 .  Turmeric 500 MG TABS, Take 1 tablet by mouth 2 (two) times daily., Disp: , Rfl:  .  zinc gluconate 50 MG tablet, Take 50 mg by mouth daily., Disp: , Rfl:   Social History   Tobacco Use  Smoking Status Never Smoker  Smokeless Tobacco Never Used    Allergies  Allergen Reactions  . Other     SEAFOOD   Objective:  There were no vitals filed for this visit. There is no height or weight on file to calculate BMI. Constitutional Well developed. Well nourished.  Vascular Dorsalis pedis pulses palpable bilaterally. Posterior tibial pulses palpable bilaterally. Capillary refill normal to all digits.  No  cyanosis or clubbing noted. Pedal hair growth normal.  Neurologic Normal speech. Oriented to person, place, and time. Epicritic sensation to light touch grossly present bilaterally.  Dermatologic Painful ingrowing nail at medial nail borders of the hallux nail right. No other open wounds. No skin lesions.  Orthopedic: Normal joint ROM without pain or crepitus bilaterally. No visible deformities. No bony tenderness.   Radiographs: None Assessment:  No diagnosis found. Plan:  Patient was evaluated and treated and all questions answered.  Ingrown Nail, right -Patient elects to proceed with minor surgery to remove ingrown toenail removal today. Consent reviewed and signed by patient. -Ingrown nail excised. See procedure  note. -Educated on post-procedure care including soaking. Written instructions provided and reviewed. -Patient to follow up in 2 weeks for nail check.  Procedure: Excision of Ingrown Toenail Location: Right 1st toe medial nail borders. Anesthesia: Lidocaine 1% plain; 1.5 mL and Marcaine 0.5% plain; 1.5 mL, digital block. Skin Prep: Betadine. Dressing: Silvadene; telfa; dry, sterile, compression dressing. Technique: Following skin prep, the toe was exsanguinated and a tourniquet was secured at the base of the toe. The affected nail border was freed, split with a nail splitter, and excised. Chemical matrixectomy was then performed with phenol and irrigated out with alcohol. The tourniquet was then removed and sterile dressing applied. Disposition: Patient tolerated procedure well. Patient to return in 2 weeks for follow-up.   No follow-ups on file.

## 2019-08-31 DIAGNOSIS — G4733 Obstructive sleep apnea (adult) (pediatric): Secondary | ICD-10-CM | POA: Diagnosis not present

## 2019-09-05 ENCOUNTER — Other Ambulatory Visit: Payer: Self-pay

## 2019-09-05 ENCOUNTER — Ambulatory Visit: Payer: Medicare HMO | Admitting: Podiatry

## 2019-09-05 ENCOUNTER — Encounter: Payer: Self-pay | Admitting: Podiatry

## 2019-09-05 DIAGNOSIS — L251 Unspecified contact dermatitis due to drugs in contact with skin: Secondary | ICD-10-CM | POA: Diagnosis not present

## 2019-09-05 DIAGNOSIS — T4995XA Adverse effect of unspecified topical agent, initial encounter: Secondary | ICD-10-CM

## 2019-09-05 NOTE — Patient Instructions (Signed)
Look for Urea cream 20-40% to apply to the dry flaking skin daily after bathing

## 2019-09-05 NOTE — Progress Notes (Signed)
Subjective:  Patient ID: Philip Woodard, male    DOB: 11-08-1950,  MRN: [id number]  Chief Complaint  Patient presents with  . Ingrown Toenail    Right great toenail, ingrown procedure about a month ago, now the skin is peeling off that toe    69 y.o. male presents with the above complaint.  Had an ingrown toenail removed on 07/31/2019 with Dr. Posey Pronto.  Phenol matricectomy was performed.  He went to put some topical steroid cream on the site this week and noticed that afterwards he started having peeling of his skin around the toe and on the finger that he used to put the cream on.  Is not painful, no previous reaction history of like this before.    Review of Systems: Negative except as noted in the HPI. Denies N/V/F/Ch.  Past Medical History:  Diagnosis Date  . Alkaline phosphatase raised 06/28/2011   Fluctuating - suspect due to sarcoidosis  . Allergic rhinitis   . Allergy   . Arthritis    knees  . Genital herpes   . Hemorrhoids   . Hyperglycemia   . Hyperplastic lymph node 06/27/2011   Submental node excised 1/02; regrowth: re-excision 10/06  . Hypothyroidism   . Multinodular goiter   . Routine general medical examination at a health care facility   . Sarcoidosis   . Sleep apnea    wears cpap     Current Outpatient Medications:  .  acetaminophen (TYLENOL) 650 MG CR tablet, Take 650 mg by mouth every 8 (eight) hours as needed., Disp: , Rfl:  .  acyclovir (ZOVIRAX) 800 MG tablet, Take 1 tablet (800 mg total) by mouth daily., Disp: 90 tablet, Rfl: 3 .  cholecalciferol (VITAMIN D3) 25 MCG (1000 UT) tablet, Take 1,000 Units by mouth daily., Disp: , Rfl:  .  cyclobenzaprine (FLEXERIL) 10 MG tablet, Take 1 tablet (10 mg total) by mouth 2 (two) times daily as needed for muscle spasms., Disp: 10 tablet, Rfl: 0 .  Glucosamine-Chondroit-Vit C-Mn (GLUCOSAMINE 1500 COMPLEX) CAPS, Take by mouth., Disp: , Rfl:  .  levothyroxine (SYNTHROID) 175 MCG tablet, TAKE 1 TABLET BY MOUTH EVERY  DAY, Disp: 90 tablet, Rfl: 1 .  Multiple Vitamin (MULTIVITAMIN WITH MINERALS) TABS tablet, Take 1 tablet by mouth daily., Disp: , Rfl:  .  Omega-3 Fatty Acids (FISH OIL) 1000 MG CAPS, Take 2 capsules by mouth., Disp: , Rfl:  .  sulfamethoxazole-trimethoprim (BACTRIM DS) 800-160 MG tablet, Take 1 tablet by mouth 2 (two) times daily., Disp: 10 tablet, Rfl: 0 .  tamsulosin (FLOMAX) 0.4 MG CAPS capsule, TAKE 1 CAPSULE BY MOUTH EVERY DAY, Disp: 90 capsule, Rfl: 0 .  Turmeric 500 MG TABS, Take 1 tablet by mouth 2 (two) times daily., Disp: , Rfl:  .  zinc gluconate 50 MG tablet, Take 50 mg by mouth daily., Disp: , Rfl:   Social History   Tobacco Use  Smoking Status Never Smoker  Smokeless Tobacco Never Used    Allergies  Allergen Reactions  . Other     SEAFOOD   Objective:  There were no vitals filed for this visit. There is no height or weight on file to calculate BMI. Constitutional Well developed. Well nourished.  Vascular Dorsalis pedis pulses palpable bilaterally. Posterior tibial pulses palpable bilaterally. Capillary refill normal to all digits.  No cyanosis or clubbing noted. Pedal hair growth normal.  Neurologic Normal speech. Oriented to person, place, and time. Epicritic sensation to light touch grossly present bilaterally.  Dermatologic Nails: Right hallux nail with some flaking skin on the skin folds next to the nail medial border.  No nail regrowth.  No signs of infection. No open wounds. No skin lesions.  Orthopedic: Normal joint ROM without pain or crepitus bilaterally. No visible deformities. No bony tenderness.   Radiographs: n/a Assessment:   1. Dermatitis due to topical drug    Plan:  Patient was evaluated and treated and all questions answered.  1.  Dermatitis due to topical drug -It appears that he has had some reaction to any residual phenol that may have been present in the matricectomy site.  Discussed some flaking of the skin around the nail and his  finger that touches it.  Appears to have self resolved.  Debrided the flaking skin from the site, and advised him to use a moisturizing or urea cream on the toenail to soften the skin edges.  Lanae Crumbly, DPM 09/05/2019     Return if symptoms worsen or fail to improve with me or Dr Posey Pronto.

## 2019-09-10 ENCOUNTER — Telehealth (INDEPENDENT_AMBULATORY_CARE_PROVIDER_SITE_OTHER): Payer: Medicare HMO | Admitting: Family

## 2019-09-10 DIAGNOSIS — J209 Acute bronchitis, unspecified: Secondary | ICD-10-CM

## 2019-09-10 MED ORDER — PREDNISONE 20 MG PO TABS
20.0000 mg | ORAL_TABLET | Freq: Every day | ORAL | 0 refills | Status: DC
Start: 2019-09-10 — End: 2020-01-16

## 2019-09-10 MED ORDER — AZITHROMYCIN 250 MG PO TABS: ORAL_TABLET | ORAL | 0 refills | Status: DC
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
## 2019-09-12 ENCOUNTER — Ambulatory Visit: Payer: Medicare HMO | Admitting: Podiatry

## 2019-11-26 ENCOUNTER — Telehealth: Payer: Self-pay | Admitting: Family

## 2019-11-26 DIAGNOSIS — R69 Illness, unspecified: Secondary | ICD-10-CM | POA: Diagnosis not present

## 2019-11-26 NOTE — Telephone Encounter (Signed)
LVM for pt to rtn my call to schedule AWV with nha

## 2019-12-02 ENCOUNTER — Other Ambulatory Visit: Payer: Self-pay | Admitting: Family

## 2019-12-02 MED ORDER — LEVOTHYROXINE SODIUM 175 MCG PO TABS: 175.0000 ug | ORAL_TABLET | Freq: Every day | ORAL | 0 refills | Status: DC

## 2019-12-15 ENCOUNTER — Encounter: Payer: Medicare HMO | Admitting: Family

## 2019-12-15 DIAGNOSIS — R69 Illness, unspecified: Secondary | ICD-10-CM | POA: Diagnosis not present

## 2020-01-16 ENCOUNTER — Ambulatory Visit (INDEPENDENT_AMBULATORY_CARE_PROVIDER_SITE_OTHER): Payer: Medicare HMO

## 2020-01-16 ENCOUNTER — Other Ambulatory Visit: Payer: Self-pay

## 2020-01-16 DIAGNOSIS — Z0000 Encounter for general adult medical examination without abnormal findings: Secondary | ICD-10-CM | POA: Diagnosis not present

## 2020-01-16 NOTE — Progress Notes (Addendum)
I connected with Philip Woodard today by telephone and verified that I am speaking with the correct person using two identifiers. Location patient: home Location provider: work Persons participating in the virtual visit: Philip Woodard and [employer], LPN.   I discussed the limitations, risks, security and privacy concerns of performing an evaluation and management service by telephone and the availability of in person appointments. I also discussed with the patient that there may be a patient responsible charge related to this service. The patient expressed understanding and verbally consented to this telephonic visit.    Interactive audio and video telecommunications were attempted between this provider and patient, however failed, due to patient having technical difficulties OR patient did not have access to video capability.  We continued and completed visit with audio only.  Some vital signs may be absent or patient reported.   Time Spent with patient on telephone encounter: 30 minutes  Subjective:   Philip Woodard is a 69 y.o. male who presents for Medicare Annual/Subsequent preventive examination.  Review of Systems    No ROS. Medicare Wellness Visit. Additional risk factors are reflected in social history. Cardiac Risk Factors include: advanced age (>87mn, >>15women);hypertension;male gender Sleep Patterns: No sleep issues, feels rested on waking and sleeps 6 hours nightly. Uses a cpap machine for OSA. Home Safety/Smoke Alarms: Feels safe in home; uses home alarm. Smoke alarms in place. Living environment: 2-story home; Lives with spouse; good support system. Seat Belt Safety/Bike Helmet: Wears seat belt.    Objective:    There were no vitals filed for this visit. There is no height or weight on file to calculate BMI.  Advanced Directives 01/16/2020 12/12/2018 12/24/2017 03/04/2015 02/22/2015  Does Patient Have a Medical Advance Directive? Yes No No No No  Type of  [place] will - - - -  Does patient want to make changes to medical advance directive? No - Patient declined - - - -  Copy of [place]? No - copy requested - - - -    Current Medications (verified) Outpatient Encounter Medications as of 01/16/2020  Medication Sig   acetaminophen (TYLENOL) 650 MG CR tablet Take 650 mg by mouth every 8 (eight) hours as needed.   acyclovir (ZOVIRAX) 800 MG tablet Take 1 tablet (800 mg total) by mouth daily.   cholecalciferol (VITAMIN D3) 25 MCG (1000 UT) tablet Take 1,000 Units by mouth daily.   Glucosamine-Chondroit-Vit C-Mn (GLUCOSAMINE 1500 COMPLEX) CAPS Take by mouth.   levothyroxine (SYNTHROID) 175 MCG tablet Take 1 tablet (175 mcg total) by mouth daily.   Multiple Vitamin (MULTIVITAMIN WITH MINERALS) TABS tablet Take 1 tablet by mouth daily.   Omega-3 Fatty Acids (FISH OIL) 1000 MG CAPS Take 2 capsules by mouth.   Turmeric 500 MG TABS Take 1 tablet by mouth 2 (two) times daily.   zinc gluconate 50 MG tablet Take 50 mg by mouth daily.   [DISCONTINUED] azithromycin (ZITHROMAX) 250 MG tablet 2 tabs po qd x 1 day; 1 tablet per day x 4 days; (Patient not taking: Reported on 01/16/2020)   [DISCONTINUED] cyclobenzaprine (FLEXERIL) 10 MG tablet Take 1 tablet (10 mg total) by mouth 2 (two) times daily as needed for muscle spasms. (Patient not taking: Reported on 01/16/2020)   [DISCONTINUED] predniSONE (DELTASONE) 20 MG tablet Take 1 tablet (20 mg total) by mouth daily with breakfast. (Patient not taking: Reported on 01/16/2020)   [DISCONTINUED] tamsulosin (FLOMAX) 0.4 MG CAPS capsule  TAKE 1 CAPSULE BY MOUTH EVERY DAY (Patient not taking: Reported on 01/16/2020)   No facility-administered encounter medications on file as of 01/16/2020.    Allergies (verified) Other   History: Past Medical History:  Diagnosis Date   Alkaline phosphatase raised 06/28/2011   Fluctuating - suspect  due to sarcoidosis   Allergic rhinitis    Allergy    Arthritis    knees   Genital herpes    Hemorrhoids    Hyperglycemia    Hyperplastic lymph node 06/27/2011   Submental node excised 1/02; regrowth: re-excision 10/06   Hypothyroidism    Multinodular goiter    Routine general medical examination at a health care facility    Sarcoidosis    Sleep apnea    wears cpap    Past Surgical History:  Procedure Laterality Date   COLONOSCOPY  5-23/2003   ROTATOR CUFF REPAIR Bilateral [id number]   THYROIDECTOMY  2002   Family History  Problem Relation Age of Onset   Asthma Cousin    Sarcoidosis Neg Hx    Colon polyps Neg Hx    Colon cancer Neg Hx    Esophageal cancer Neg Hx    Stomach cancer Neg Hx    Rectal cancer Neg Hx    Social History   Socioeconomic History   Marital status: Married    Spouse name: Not on file   Number of children: Not on file   Years of education: Not on file   Highest education level: Not on file  Occupational History   Not on file  Tobacco Use   Smoking status: Never Smoker   Smokeless tobacco: Never Used  Substance and Sexual Activity   Alcohol use: Yes    Comment: 3 times per month   Drug use: Never   Sexual activity: Not on file  Other Topics Concern   Not on file  Social History Narrative   Not on file   Social Determinants of Health   Financial Resource Strain: Low Risk    Difficulty of Paying Living Expenses: Not hard at all  Food Insecurity: No Food Insecurity   Worried About Charity fundraiser in the Last Year: Never true   Arboriculturist in the Last Year: Never true  Transportation Needs: No Transportation Needs   Lack of Transportation (Medical): No   Lack of Transportation (Non-Medical): No  Physical Activity: Sufficiently Active   Days of Exercise per Week: 5 days   Minutes of Exercise per Session: 30 min  Stress: No Stress Concern Present   Feeling of Stress : Not at all  Social  Connections: Unknown   Frequency of Communication with Friends and Family: More than three times a week   Frequency of Social Gatherings with Friends and Family: Never   Attends Religious Services: Patient refused   Marine scientist or Organizations: Patient refused   Attends Music therapist: Patient refused   Marital Status: Married    Tobacco Counseling Counseling given: Not Answered   Clinical Intake:  Pre-visit preparation completed: Yes  Pain : No/denies pain     Nutritional Risks: None Diabetes: No  How often do you need to have someone help you when you read instructions, pamphlets, or other written materials from your doctor or pharmacy?: 1 - Never What is the last grade level you completed in school?: Master's Degree  Diabetic? no  Interpreter Needed?: No  Information entered by :: Lisette Abu, LPN   Activities of Daily  Living In your present state of health, do you have any difficulty performing the following activities: 01/16/2020  Hearing? Y  Vision? N  Difficulty concentrating or making decisions? N  Walking or climbing stairs? N  Dressing or bathing? N  Doing errands, shopping? N  Preparing Food and eating ? N  Using the Toilet? N  In the past six months, have you accidently leaked urine? N  Do you have problems with loss of bowel control? N  Managing your Medications? N  Managing your Finances? N  Housekeeping or managing your Housekeeping? N  Some recent data might be hidden    Patient Care Team: Marrian Salvage, FNP as PCP - General (Internal Medicine) Tanda Rockers, MD (Pulmonary Disease) Kimbrough, Achille Rich., MD as Attending Physician (Urology)  Indicate any recent Medical Services you may have received from other than [facility] providers in the past year (date may be approximate).     Assessment:   This is a routine wellness examination for [place].  Hearing/Vision screen No exam data  present  Dietary issues and exercise activities discussed: Current Exercise Habits: Home exercise routine, Type of exercise: walking;treadmill;strength training/weights;stretching (Home Gym; Walk 3 x week for 2 miles each), Time (Minutes): 30, Frequency (Times/Week): 5, Weekly Exercise (Minutes/Week): 150, Intensity: Moderate, Exercise limited by: None identified  Goals     Patient Stated     My goal is to reduce my weight by 10 pounds and continue to be independent and physically active.      Depression Screen PHQ 2/9 Scores 01/16/2020 08/06/2019 01/04/2018 04/05/2015 03/04/2015  PHQ - 2 Score 0 0 0 0 0    Fall Risk Fall Risk  01/16/2020 08/06/2019 01/03/2018 03/04/2015  Falls in the past year? 0 0 No No  Number falls in past yr: 0 0 - -  Injury with Fall? 0 0 - -  Risk for fall due to : No Fall Risks No Fall Risks - -  Follow up Falls evaluation completed Falls evaluation completed - -    Any stairs in or around the home? Yes  If so, are there any without handrails? No  Home free of loose throw rugs in walkways, pet beds, electrical cords, etc? Yes  Adequate lighting in your home to reduce risk of falls? Yes   ASSISTIVE DEVICES UTILIZED TO PREVENT FALLS:  Life alert? No  Use of a cane, walker or w/c? No  Grab bars in the bathroom? No  Shower chair or bench in shower? No  Elevated toilet seat or a handicapped toilet? No   TIMED UP AND GO:  Was the test performed? No .  Length of time to ambulate 10 feet: 0 sec.   Gait steady and fast without use of assistive device  Cognitive Function: Patient is cogitatively intact.        Immunizations Immunization History  Administered Date(s) Administered   Fluad Quad(high Dose 65+) 12/11/2018   Influenza Whole 12/11/2009, 12/11/2011   Influenza, High Dose Seasonal PF 11/14/2017   Influenza,inj,Quad PF,6+ Mos 01/31/2013, 12/10/2013, 02/22/2016, 12/19/2016   PFIZER SARS-COV-2 Vaccination 04/04/2019, 04/25/2019    Pneumococcal Conjugate-13 11/14/2017   Pneumococcal Polysaccharide-23 01/31/2013   Td 10/23/2005   Tdap 02/22/2016   Zoster 01/31/2013    TDAP status: Up to date Flu Vaccine status: Up to date Pneumococcal vaccine status: Up to date Covid-19 vaccine status: Completed vaccines  Qualifies for Shingles Vaccine? Yes   Zostavax completed Yes   Shingrix Completed?: No.    Education  has been provided regarding the importance of this vaccine. Patient has been advised to call insurance company to determine out of pocket expense if they have not yet received this vaccine. Advised may also receive vaccine at local pharmacy or Health Dept. Verbalized acceptance and understanding.  Screening Tests Health Maintenance  Topic Date Due   PNA vac Low Risk Adult (2 of 2 - PPSV23) 11/15/2018   INFLUENZA VACCINE  10/12/2019   COLONOSCOPY  02/04/2021   TETANUS/TDAP  02/21/2026   COVID-19 Vaccine  Completed   Hepatitis C Screening  Completed    Health Maintenance  Health Maintenance Due  Topic Date Due   PNA vac Low Risk Adult (2 of 2 - PPSV23) 11/15/2018   INFLUENZA VACCINE  10/12/2019    Colorectal cancer screening: Completed 02/04/2018. Repeat every 3 years  Lung Cancer Screening: (Low Dose CT Chest recommended if Age 45-80 years, 30 pack-year currently smoking OR have quit w/in 15years.) does not qualify.   Lung Cancer Screening Referral: no  Additional Screening:  Hepatitis C Screening: does qualify; Completed yes  Vision Screening: Recommended annual ophthalmology exams for early detection of glaucoma and other disorders of the eye. Is the patient up to date with their annual eye exam?  Yes  Who is the provider or what is the name of the office in which the patient attends annual eye exams? [facility] If pt is not established with a provider, would they like to be referred to a provider to establish care? No .   Dental Screening: Recommended annual dental exams for proper  oral hygiene  Community Resource Referral / Chronic Care Management: CRR required this visit?  No   CCM required this visit?  No      Plan:     I have personally reviewed and noted the following in the patients chart:    Medical and social history  Use of alcohol, tobacco or illicit drugs   Current medications and supplements  Functional ability and status  Nutritional status  Physical activity  Advanced directives  List of other physicians  Hospitalizations, surgeries, and ER visits in previous 12 months  Vitals  Screenings to include cognitive, depression, and falls  Referrals and appointments  In addition, I have reviewed and discussed with patient certain preventive protocols, quality metrics, and best practice recommendations. A written personalized care plan for preventive services as well as general preventive health recommendations were provided to patient.     Sheral Flow, LPN   [id number]   Nurse Notes:  Patient is cogitatively intact. There were no vitals filed for this visit. There is no height or weight on file to calculate BMI. Patient stated that he has no issues with gait or balance; does not use any assistive devices.  Medical screening examination/treatment/procedure(s) were performed by non-physician practitioner and as supervising provider I was immediately available for consultation/collaboration.  I agree with above. Marrian Salvage, FNP

## 2020-01-16 NOTE — Patient Instructions (Addendum)
Philip Woodard , Thank you for taking time to come for your Medicare Wellness Visit. I appreciate your ongoing commitment to your health goals. Please review the following plan we discussed and let me know if I can assist you in the future.   Screening recommendations/referrals: Colonoscopy: 02/04/2018; due every 3 years Recommended yearly ophthalmology/optometry visit for glaucoma screening and checkup Recommended yearly dental visit for hygiene and checkup  Vaccinations: Influenza vaccine: 12/11/2018 Pneumococcal vaccine: up to date Tdap vaccine: 02/22/2016; due every 10 years Shingles vaccine: never done; did have Zoster 01/31/2013   Covid-19: up to date  Advanced directives: Please bring a copy of your health care power of attorney and living will to the office at your convenience.   Conditions/risks identified: Yes; Reviewed health maintenance screenings with patient today and relevant education, vaccines, and/or referrals were provided. Please continue to do your personal lifestyle choices by: daily care of teeth and gums, regular physical activity (goal should be 5 days a week for 30 minutes), eat a healthy diet, avoid tobacco and drug use, limiting any alcohol intake, taking a low-dose aspirin (if not allergic or have been advised by your provider otherwise) and taking vitamins and minerals as recommended by your provider. Continue doing brain stimulating activities (puzzles, reading, adult coloring books, staying active) to keep memory sharp. Continue to eat heart healthy diet (full of fruits, vegetables, whole grains, lean protein, water--limit salt, fat, and sugar intake) and increase physical activity as tolerated.  Next appointment: Please schedule your next Medicare Wellness Visit with your Nurse Health Advisor in 1 year.   Preventive Care 67 Years and Older, Male Preventive care refers to lifestyle choices and visits with your health care provider that can promote health and  wellness. What does preventive care include?  A yearly physical exam. This is also called an annual well check.  Dental exams once or twice a year.  Routine eye exams. Ask your health care provider how often you should have your eyes checked.  Personal lifestyle choices, including:  Daily care of your teeth and gums.  Regular physical activity.  Eating a healthy diet.  Avoiding tobacco and drug use.  Limiting alcohol use.  Practicing safe sex.  Taking low doses of aspirin every day.  Taking vitamin and mineral supplements as recommended by your health care provider. What happens during an annual well check? The services and screenings done by your health care provider during your annual well check will depend on your age, overall health, lifestyle risk factors, and family history of disease. Counseling  Your health care provider may ask you questions about your:  Alcohol use.  Tobacco use.  Drug use.  Emotional well-being.  Home and relationship well-being.  Sexual activity.  Eating habits.  History of falls.  Memory and ability to understand (cognition).  Work and work Statistician. Screening  You may have the following tests or measurements:  Height, weight, and BMI.  Blood pressure.  Lipid and cholesterol levels. These may be checked every 5 years, or more frequently if you are over 55 years old.  Skin check.  Lung cancer screening. You may have this screening every year starting at age 84 if you have a 30-pack-year history of smoking and currently smoke or have quit within the past 15 years.  Fecal occult blood test (FOBT) of the stool. You may have this test every year starting at age 73.  Flexible sigmoidoscopy or colonoscopy. You may have a sigmoidoscopy every 5 years or a colonoscopy  every 10 years starting at age 38.  Prostate cancer screening. Recommendations will vary depending on your family history and other risks.  Hepatitis C blood  test.  Hepatitis B blood test.  Sexually transmitted disease (STD) testing.  Diabetes screening. This is done by checking your blood sugar (glucose) after you have not eaten for a while (fasting). You may have this done every 1-3 years.  Abdominal aortic aneurysm (AAA) screening. You may need this if you are a current or former smoker.  Osteoporosis. You may be screened starting at age 23 if you are at high risk. Talk with your health care provider about your test results, treatment options, and if necessary, the need for more tests. Vaccines  Your health care provider may recommend certain vaccines, such as:  Influenza vaccine. This is recommended every year.  Tetanus, diphtheria, and acellular pertussis (Tdap, Td) vaccine. You may need a Td booster every 10 years.  Zoster vaccine. You may need this after age 45.  Pneumococcal 13-valent conjugate (PCV13) vaccine. One dose is recommended after age 45.  Pneumococcal polysaccharide (PPSV23) vaccine. One dose is recommended after age 63. Talk to your health care provider about which screenings and vaccines you need and how often you need them. This information is not intended to replace advice given to you by your health care provider. Make sure you discuss any questions you have with your health care provider. Document Released: 03/26/2015 Document Revised: 11/17/2015 Document Reviewed: 12/29/2014 Elsevier Interactive Patient Education  2017 [place] Prevention in the Home Falls can cause injuries. They can happen to people of all ages. There are many things you can do to make your home safe and to help prevent falls. What can I do on the outside of my home?  Regularly fix the edges of walkways and driveways and fix any cracks.  Remove anything that might make you trip as you walk through a door, such as a raised step or threshold.  Trim any bushes or trees on the path to your home.  Use bright outdoor  lighting.  Clear any walking paths of anything that might make someone trip, such as rocks or tools.  Regularly check to see if handrails are loose or broken. Make sure that both sides of any steps have handrails.  Any raised decks and porches should have guardrails on the edges.  Have any leaves, snow, or ice cleared regularly.  Use sand or salt on walking paths during winter.  Clean up any spills in your garage right away. This includes oil or grease spills. What can I do in the bathroom?  Use night lights.  Install grab bars by the toilet and in the tub and shower. Do not use towel bars as grab bars.  Use non-skid mats or decals in the tub or shower.  If you need to sit down in the shower, use a plastic, non-slip stool.  Keep the floor dry. Clean up any water that spills on the floor as soon as it happens.  Remove soap buildup in the tub or shower regularly.  Attach bath mats securely with double-sided non-slip rug tape.  Do not have throw rugs and other things on the floor that can make you trip. What can I do in the bedroom?  Use night lights.  Make sure that you have a light by your bed that is easy to reach.  Do not use any sheets or blankets that are too big for your bed. They should  not hang down onto the floor.  Have a firm chair that has side arms. You can use this for support while you get dressed.  Do not have throw rugs and other things on the floor that can make you trip. What can I do in the kitchen?  Clean up any spills right away.  Avoid walking on wet floors.  Keep items that you use a lot in easy-to-reach places.  If you need to reach something above you, use a strong step stool that has a grab bar.  Keep electrical cords out of the way.  Do not use floor polish or wax that makes floors slippery. If you must use wax, use non-skid floor wax.  Do not have throw rugs and other things on the floor that can make you trip. What can I do with my  stairs?  Do not leave any items on the stairs.  Make sure that there are handrails on both sides of the stairs and use them. Fix handrails that are broken or loose. Make sure that handrails are as long as the stairways.  Check any carpeting to make sure that it is firmly attached to the stairs. Fix any carpet that is loose or worn.  Avoid having throw rugs at the top or bottom of the stairs. If you do have throw rugs, attach them to the floor with carpet tape.  Make sure that you have a light switch at the top of the stairs and the bottom of the stairs. If you do not have them, ask someone to add them for you. What else can I do to help prevent falls?  Wear shoes that:  Do not have high heels.  Have rubber bottoms.  Are comfortable and fit you well.  Are closed at the toe. Do not wear sandals.  If you use a stepladder:  Make sure that it is fully opened. Do not climb a closed stepladder.  Make sure that both sides of the stepladder are locked into place.  Ask someone to hold it for you, if possible.  Clearly mark and make sure that you can see:  Any grab bars or handrails.  First and last steps.  Where the edge of each step is.  Use tools that help you move around (mobility aids) if they are needed. These include:  Canes.  Walkers.  Scooters.  Crutches.  Turn on the lights when you go into a dark area. Replace any light bulbs as soon as they burn out.  Set up your furniture so you have a clear path. Avoid moving your furniture around.  If any of your floors are uneven, fix them.  If there are any pets around you, be aware of where they are.  Review your medicines with your doctor. Some medicines can make you feel dizzy. This can increase your chance of falling. Ask your doctor what other things that you can do to help prevent falls. This information is not intended to replace advice given to you by your health care provider. Make sure you discuss any  questions you have with your health care provider. Document Released: 12/24/2008 Document Revised: 08/05/2015 Document Reviewed: 04/03/2014 Elsevier Interactive Patient Education  2017 [employer].

## 2020-01-20 ENCOUNTER — Encounter: Payer: Self-pay | Admitting: Family

## 2020-01-20 ENCOUNTER — Ambulatory Visit (INDEPENDENT_AMBULATORY_CARE_PROVIDER_SITE_OTHER): Payer: Medicare HMO | Admitting: Family

## 2020-01-20 ENCOUNTER — Other Ambulatory Visit: Payer: Self-pay

## 2020-01-20 VITALS — BP 150/90 | HR 87 | Temp 98.3°F | Ht 67.0 in | Wt 196.0 lb

## 2020-01-20 DIAGNOSIS — Z0000 Encounter for general adult medical examination without abnormal findings: Secondary | ICD-10-CM

## 2020-01-20 DIAGNOSIS — E039 Hypothyroidism, unspecified: Secondary | ICD-10-CM | POA: Diagnosis not present

## 2020-01-20 DIAGNOSIS — Z125 Encounter for screening for malignant neoplasm of prostate: Secondary | ICD-10-CM | POA: Diagnosis not present

## 2020-01-20 DIAGNOSIS — Z13220 Encounter for screening for lipoid disorders: Secondary | ICD-10-CM

## 2020-01-20 DIAGNOSIS — Z23 Encounter for immunization: Secondary | ICD-10-CM | POA: Diagnosis not present

## 2020-01-20 DIAGNOSIS — D869 Sarcoidosis, unspecified: Secondary | ICD-10-CM | POA: Diagnosis not present
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
## 2020-02-04 ENCOUNTER — Encounter: Payer: Self-pay | Admitting: Family

## 2020-02-04 ENCOUNTER — Other Ambulatory Visit (INDEPENDENT_AMBULATORY_CARE_PROVIDER_SITE_OTHER): Payer: Medicare HMO

## 2020-02-04 ENCOUNTER — Other Ambulatory Visit: Payer: Self-pay | Admitting: Family

## 2020-02-04 DIAGNOSIS — Z125 Encounter for screening for malignant neoplasm of prostate: Secondary | ICD-10-CM

## 2020-02-04 DIAGNOSIS — G4730 Sleep apnea, unspecified: Secondary | ICD-10-CM

## 2020-02-04 DIAGNOSIS — E039 Hypothyroidism, unspecified: Secondary | ICD-10-CM | POA: Diagnosis not present

## 2020-02-04 DIAGNOSIS — Z13220 Encounter for screening for lipoid disorders: Secondary | ICD-10-CM

## 2020-02-04 DIAGNOSIS — Z0000 Encounter for general adult medical examination without abnormal findings: Secondary | ICD-10-CM

## 2020-02-04 LAB — COMPREHENSIVE METABOLIC PANEL
ALT: 20 U/L (ref 0–53)
AST: 27 U/L (ref 0–37)
Albumin: 4.1 g/dL (ref 3.5–5.2)
Alkaline Phosphatase: 72 U/L (ref 39–117)
BUN: 19 mg/dL (ref 6–23)
CO2: 28 mEq/L (ref 19–32)
Calcium: 9.3 mg/dL (ref 8.4–10.5)
Chloride: 105 mEq/L (ref 96–112)
Creatinine, Ser: 1.36 mg/dL (ref 0.40–1.50)
GFR: 53.79 mL/min — ABNORMAL LOW (ref >60.00)
Glucose, Bld: 81 mg/dL (ref 70–99)
Potassium: 3.8 mEq/L (ref 3.5–5.1)
Sodium: 144 mEq/L (ref 135–145)
Total Bilirubin: 0.6 mg/dL (ref 0.2–1.2)
Total Protein: 6.8 g/dL (ref 6.0–8.3)

## 2020-02-04 LAB — CBC WITH DIFFERENTIAL/PLATELET
Basophils Absolute: 0.1 10*3/uL (ref 0.0–0.1)
Basophils Relative: 0.7 % (ref 0.0–3.0)
Eosinophils Absolute: 0.3 10*3/uL (ref 0.0–0.7)
Eosinophils Relative: 3.7 % (ref 0.0–5.0)
HCT: 39.8 % (ref 39.0–52.0)
Hemoglobin: 13.3 g/dL (ref 13.0–17.0)
Lymphocytes Relative: 31.3 % (ref 12.0–46.0)
Lymphs Abs: 2.6 10*3/uL (ref 0.7–4.0)
MCHC: 33.4 g/dL (ref 30.0–36.0)
MCV: 90.2 fl (ref 78.0–100.0)
Monocytes Absolute: 0.9 10*3/uL (ref 0.1–1.0)
Monocytes Relative: 11.3 % (ref 3.0–12.0)
Neutro Abs: 4.4 10*3/uL (ref 1.4–7.7)
Neutrophils Relative %: 53 % (ref 43.0–77.0)
Platelets: 240 10*3/uL (ref 150.0–400.0)
RBC: 4.41 Mil/uL (ref 4.22–5.81)
RDW: 14.3 % (ref 11.5–15.5)
WBC: 8.3 10*3/uL (ref 4.0–10.5)

## 2020-02-04 LAB — LIPID PANEL
Cholesterol: 139 mg/dL (ref 0–200)
HDL: 49.4 mg/dL (ref >39.00)
LDL Cholesterol: 75 mg/dL (ref 0–99)
NonHDL: 89.34
Total CHOL/HDL Ratio: 3
Triglycerides: 71 mg/dL (ref 0.0–149.0)
VLDL: 14.2 mg/dL (ref 0.0–40.0)

## 2020-02-04 LAB — TSH: TSH: 2.21 u[IU]/mL (ref 0.35–4.50)

## 2020-02-04 LAB — PSA: PSA: 1.2 ng/mL (ref 0.10–4.00)

## 2020-02-10 ENCOUNTER — Ambulatory Visit (INDEPENDENT_AMBULATORY_CARE_PROVIDER_SITE_OTHER)
Admission: RE | Admit: 2020-02-10 | Discharge: 2020-02-10 | Disposition: A | Discharge status: Home / Self Care | Payer: Medicare HMO | Source: Ambulatory Visit | Attending: Family | Admitting: Family

## 2020-02-10 ENCOUNTER — Other Ambulatory Visit: Payer: Self-pay

## 2020-02-10 DIAGNOSIS — D869 Sarcoidosis, unspecified: Secondary | ICD-10-CM | POA: Diagnosis not present

## 2020-02-13 ENCOUNTER — Other Ambulatory Visit: Payer: Self-pay | Admitting: Family

## 2020-02-13 DIAGNOSIS — Z0100 Encounter for examination of eyes and vision without abnormal findings: Secondary | ICD-10-CM

## 2020-02-13 DIAGNOSIS — D869 Sarcoidosis, unspecified: Secondary | ICD-10-CM

## 2020-02-13 MED ORDER — AMLODIPINE BESYLATE 5 MG PO TABS: 5.0000 mg | ORAL_TABLET | Freq: Every day | ORAL | 0 refills | Status: DC

## 2020-03-04 ENCOUNTER — Other Ambulatory Visit: Payer: Self-pay | Admitting: Family

## 2020-03-15 ENCOUNTER — Encounter: Payer: Self-pay | Admitting: Neurology

## 2020-03-16 ENCOUNTER — Encounter: Payer: Self-pay | Admitting: Neurology

## 2020-03-16 ENCOUNTER — Ambulatory Visit (INDEPENDENT_AMBULATORY_CARE_PROVIDER_SITE_OTHER): Payer: Medicare HMO | Admitting: Neurology

## 2020-03-16 VITALS — BP 137/71 | HR 76 | Ht 67.0 in | Wt 201.0 lb

## 2020-03-16 DIAGNOSIS — G4733 Obstructive sleep apnea (adult) (pediatric): Secondary | ICD-10-CM

## 2020-03-16 DIAGNOSIS — Z9989 Dependence on other enabling machines and devices: Secondary | ICD-10-CM | POA: Diagnosis not present

## 2020-03-16 NOTE — Patient Instructions (Signed)
It was nice to meet you today.  You are fully compliant with your current machine, which is an AutoPap machine.  Keep up the good work.  Please make an appointment with [facility] for a mask refit, your fullface mask is leaking fairly consistently.  You may need a different size or a different style altogether as far as new mask is concerned.  Everything else looks good.  Please follow-up routinely to see one of our nurse practitioners in 1 year.  You may be eligible for a new machine by the fall of this year.  You may need new sleep study before we can prescribe a new machine but for now your current machine seems to work really well.  I am glad to hear that you have benefited from therapy.

## 2020-03-16 NOTE — Progress Notes (Signed)
Order for mask refit sent to [facility] via community message. Confirmation received that the order transmitted was successful.

## 2020-03-16 NOTE — Progress Notes (Signed)
Subjective:    Patient ID: Philip Woodard is a 70 y.o. male.  HPI     Star Age, MD, PhD [facility] [address] [place], [place] [address]  Dear Mickel Baas,   I saw your patient, Philip Woodard, upon your kind request, in my Sleep clinic today for initial consultation of his sleep disorder, in particular, evaluation of his prior diagnosis of obstructive sleep apnea.  The patient is unaccompanied today.  As you know, Philip Woodard is a 70 year old right-handed gentleman with an underlying medical history of sarcoidosis, hypothyroidism, arthritis, allergic rhinitis, and borderline obesity, who was previously diagnosed with obstructive sleep apnea and placed on CPAP therapy.  Prior sleep study results are not available for my review today.  I reviewed your office note from 01/15/2020.  His Epworth sleepiness score is 10 of 24, fatigue severity score is 11 out of 63. He has benefited from treatment.  He is fully compliant with his AutoPap.  He reports that he had a sleep study originally at [facility] in 2000.  He had a home sleep test some for 5 years ago he recalls.  His current machine works well.  He does use a full facemask, he brought his machine and his mask, he uses a large F 20 fullface mask.  He does notice the leak at times.  He also has mouth dryness.  He generally goes to bed between 11 and midnight and rise time is between 530 and 6.  He denies any recurrent morning headaches or nighttime nocturia.  He is married and lives with his wife.  He is semiretired.  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  They have 2 grown daughters, he has one 68-year-old granddaughter as well.  His older daughter lives in [place], younger daughter in [place].  Younger daughter has sleep apnea, he has a brother with sleep apnea as well.  He is a non-smoker and drinks alcohol rarely, maybe once a month and no day-to-day caffeine,  occasional coffee during the week. I was able to review his CPAP compliance data from 02/15/2020 through 03/15/2020, which is a total of 30 days, during which time he used his machine every night, with percent use days greater than 4 hours at 97%, indicating excellent compliance with an average usage of 6 hours and 6 minutes, residual AHI at goal at 3/h, 95th percentile of pressure at 13.6 cm with a range of 4 to 16 cm with EPR.  He is on AutoPap, he has an air sense 10 auto machine from [employer], set up date according to online records was 10/28/2015.  His air leakage is on the high side fairly consistently with the 95th percentile at 50.8 L/min.  His DME company is [facility].  His Past Medical History Is Significant For: Past Medical History:  Diagnosis Date  . Alkaline phosphatase raised 06/28/2011   Fluctuating - suspect due to sarcoidosis  . Allergic rhinitis   . Allergy   . Arthritis    knees  . Genital herpes   . Hemorrhoids   . Hyperglycemia   . Hyperplastic lymph node 06/27/2011   Submental node excised 1/02; regrowth: re-excision 10/06  . Hypothyroidism   . Multinodular goiter   . Routine general medical examination at a health care facility   . Sarcoidosis   . Sleep apnea    wears cpap     His Past Surgical History Is Significant For:  Past Surgical History:  Procedure Laterality Date  . COLONOSCOPY  5-23/2003  . ROTATOR CUFF REPAIR Bilateral [id number]  . THYROIDECTOMY  2002    His Family History Is Significant For: Family History  Problem Relation Age of Onset  . Asthma Cousin   . Sarcoidosis Neg Hx   . Colon polyps Neg Hx   . Colon cancer Neg Hx   . Esophageal cancer Neg Hx   . Stomach cancer Neg Hx   . Rectal cancer Neg Hx     His Social History Is Significant For: Social History   Socioeconomic History  . Marital status: Married    Spouse name: Not on file  . Number of children: Not on file  . Years of education: Not on file  . Highest education level:  Not on file  Occupational History  . Not on file  Tobacco Use  . Smoking status: Never Smoker  . Smokeless tobacco: Never Used  Substance and Sexual Activity  . Alcohol use: Yes    Comment: 3 times per month  . Drug use: Never  . Sexual activity: Not on file  Other Topics Concern  . Not on file  Social History Narrative  . Not on file   Social Determinants of Health   Financial Resource Strain: Low Risk   . Difficulty of Paying Living Expenses: Not hard at all  Food Insecurity: No Food Insecurity  . Worried About Charity fundraiser in the Last Year: Never true  . Ran Out of Food in the Last Year: Never true  Transportation Needs: No Transportation Needs  . Lack of Transportation (Medical): No  . Lack of Transportation (Non-Medical): No  Physical Activity: Sufficiently Active  . Days of Exercise per Week: 5 days  . Minutes of Exercise per Session: 30 min  Stress: No Stress Concern Present  . Feeling of Stress : Not at all  Social Connections: Unknown  . Frequency of Communication with Friends and Family: More than three times a week  . Frequency of Social Gatherings with Friends and Family: Never  . Attends Religious Services: Patient refused  . Active Member of Clubs or Organizations: Patient refused  . Attends Archivist Meetings: Patient refused  . Marital Status: Married    His Allergies Are:  Allergies  Allergen Reactions  . Other     SEAFOOD  :   His Current Medications Are:  Outpatient Encounter Medications as of 03/16/2020  Medication Sig  . acetaminophen (TYLENOL) 650 MG CR tablet Take 650 mg by mouth every 8 (eight) hours as needed.  Marland Kitchen acyclovir (ZOVIRAX) 800 MG tablet Take 1 tablet (800 mg total) by mouth daily.  Marland Kitchen amLODipine (NORVASC) 5 MG tablet Take 1 tablet (5 mg total) by mouth daily.  . cholecalciferol (VITAMIN D3) 25 MCG (1000 UT) tablet Take 1,000 Units by mouth daily.  . Glucosamine-Chondroit-Vit C-Mn (GLUCOSAMINE 1500 COMPLEX) CAPS  Take by mouth.  . levothyroxine (SYNTHROID) 175 MCG tablet TAKE 1 TABLET BY MOUTH EVERY DAY  . Multiple Vitamin (MULTIVITAMIN WITH MINERALS) TABS tablet Take 1 tablet by mouth daily.  . Omega-3 Fatty Acids (FISH OIL) 1000 MG CAPS Take 2 capsules by mouth.  . Turmeric 500 MG TABS Take 1 tablet by mouth 2 (two) times daily.  Marland Kitchen zinc gluconate 50 MG tablet Take 50 mg by mouth daily.   No facility-administered encounter medications on file as of 03/16/2020.  :  Review of Systems:  Out of a complete 14 point review  of systems, all are reviewed and negative with the exception of these symptoms as listed below: Review of Systems  Neurological:       Pt presents today to discuss his sleep. Pt has a cpap and it was replaced a few years ago. DME is [facility]. He thinks his sleep study was over 5 years ago.  Epworth Sleepiness Scale 0= would never doze 1= slight chance of dozing 2= moderate chance of dozing 3= high chance of dozing  Sitting and reading: 2 Watching TV: 2 Sitting inactive in a public place (ex. Theater or meeting): 2 As a passenger in a car for an hour without a break: 2 Lying down to rest in the afternoon: 2 Sitting and talking to someone: 0 Sitting quietly after lunch (no alcohol): 0 In a car, while stopped in traffic: 0 Total: 10     Objective:  Neurological Exam  Physical Exam Physical Examination:   Vitals:   03/16/20 0906  BP: 137/71  Pulse: 76    General Examination: The patient is a very pleasant 70 y.o. male in no acute distress. He appears well-developed and well-nourished and well groomed.   HEENT: Normocephalic, atraumatic, pupils are equal, round and reactive to light, extraocular tracking is good without limitation to gaze excursion or nystagmus noted. Hearing is grossly intact. Face is symmetric with normal facial animation. Speech is clear with no dysarthria noted. There is no hypophonia. There is no lip, neck/head, jaw or voice tremor. Neck is supple with  full range of passive and active motion. There are no carotid bruits on auscultation. Oropharynx exam reveals: mild mouth dryness, adequate dental hygiene and moderate airway crowding, due to redundant soft palate, tonsils on the smaller side, seen uvula.  Mallampati class II.  Neck circumference of 17-7/8 inches.  Tongue protrudes centrally in palate elevates symmetrically.  Chest: Clear to auscultation without wheezing, rhonchi or crackles noted.  Heart: S1+S2+0, regular and normal without murmurs, rubs or gallops noted.   Abdomen: Soft, non-tender and non-distended with normal bowel sounds appreciated on auscultation.  Extremities: There is no pitting edema in the distal lower extremities bilaterally.   Skin: Warm and dry without trophic changes noted.   Musculoskeletal: exam reveals no obvious joint deformities, tenderness or joint swelling or erythema.   Neurologically:  Mental status: The patient is awake, alert and oriented in all 4 spheres. His immediate and remote memory, attention, language skills and fund of knowledge are appropriate. There is no evidence of aphasia, agnosia, apraxia or anomia. Speech is clear with normal prosody and enunciation. Thought process is linear. Mood is normal and affect is normal.  Cranial nerves II - XII are as described above under HEENT exam.  Motor exam: Normal bulk, strength and tone is noted. There is no tremor, Romberg is negative. Fine motor skills and coordination: grossly intact.  Cerebellar testing: No dysmetria or intention tremor. There is no truncal or gait ataxia.  Sensory exam: intact to light touch in the upper and lower extremities.  Gait, station and balance: He stands easily. No veering to one side is noted. No leaning to one side is noted. Posture is age-appropriate and stance is narrow based. Gait shows normal stride length and normal pace. No problems turning are noted. Tandem walk is slightly challenging in the beginning but doable.                   Assessment and Plan:  In summary, Philip Woodard is a very pleasant 70  y.o.-year old male with an underlying medical history of sarcoidosis, hypothyroidism, arthritis, allergic rhinitis, and borderline obesity, who presents for evaluation of his obstructive sleep apnea.  He was diagnosed originally some 20 years ago and has been on treatment, this is his second machine since August 2017.  He is compliant with his AutoPap and is commended for this.  He has benefited from treatment and is motivated to continue with it.  He does endorse air leakage from the mask.  His leak is consistently on the high side.  He is using a large full facemask, of 20 from [employer].  He is typically up-to-date with his supplies from his DME company, [facility].  He is advised to seek an appointment for a mask refit.  I will also update his prescription for supplies.  He is likely going to be eligible for a new machine in the fall of this year.  He is not keen on getting a new machine right away as this 1 is working quite well for him.  We mutually agreed to have him follow-up routinely in this clinic with one of our nurse practitioners in 1 year, sooner if needed.  He is advised to call us with any interim questions or concerns he may have.  He is advised to continue with the current treatment at the current settings but he may benefit from a different size of different style of the mask interface. I answered all his questions today and the patient was in agreement.  Thank you very much for allowing me to participate in the care of this nice patient. If I can be of any further assistance to you please do not hesitate to call me at [phone].  Sincerely,   Star Age, MD, PhD

## 2020-03-23 ENCOUNTER — Telehealth: Payer: Self-pay

## 2020-03-23 NOTE — Telephone Encounter (Signed)
Received this noticed from [facility]: "Just giving you an update on this mask fitting request... patient did not answer any of our attempts to schedule.   We voided the order."

## 2020-03-25 ENCOUNTER — Encounter: Payer: Self-pay | Admitting: Family

## 2020-03-26 ENCOUNTER — Other Ambulatory Visit: Payer: Self-pay | Admitting: Family

## 2020-03-26 MED ORDER — SILDENAFIL CITRATE 20 MG PO TABS: ORAL_TABLET | ORAL | 0 refills | Status: DC

## 2020-05-10 ENCOUNTER — Other Ambulatory Visit: Payer: Self-pay | Admitting: Family

## 2020-05-10 NOTE — Telephone Encounter (Signed)
He needs a follow-up on his blood pressure please;

## 2020-05-24 NOTE — Telephone Encounter (Signed)
Pt verb understanding of need for HTN f/u.  Appt scheduled for 06/30/20.

## 2020-05-25 DIAGNOSIS — H2513 Age-related nuclear cataract, bilateral: Secondary | ICD-10-CM | POA: Diagnosis not present

## 2020-05-25 DIAGNOSIS — Z961 Presence of intraocular lens: Secondary | ICD-10-CM | POA: Diagnosis not present

## 2020-05-25 DIAGNOSIS — H524 Presbyopia: Secondary | ICD-10-CM | POA: Diagnosis not present

## 2020-05-27 ENCOUNTER — Ambulatory Visit: Payer: Self-pay

## 2020-05-27 ENCOUNTER — Ambulatory Visit: Payer: Medicare HMO | Admitting: Family Medicine

## 2020-05-27 ENCOUNTER — Other Ambulatory Visit: Payer: Self-pay

## 2020-05-27 VITALS — BP 146/62 | HR 103 | Ht 67.0 in | Wt 200.0 lb

## 2020-05-27 DIAGNOSIS — M76822 Posterior tibial tendinitis, left leg: Secondary | ICD-10-CM

## 2020-05-27 MED ORDER — PREDNISONE 5 MG PO TABS: ORAL_TABLET | ORAL | 0 refills | Status: DC

## 2020-05-27 NOTE — Patient Instructions (Signed)
Good to see you Please try ice  Please try the exericses   Please send me a message in MyChart with any questions or updates.  Please see me back in 4 weeks.   --Dr. Raeford Razor

## 2020-05-27 NOTE — Progress Notes (Unsigned)
Philip Woodard - 70 y.o. male MRN [id number]  Date of birth: Sep 26, 1950  SUBJECTIVE:  Including CC & ROS.  No chief complaint on file.   Philip Woodard is a 70 y.o. male that is presenting with acute on chronic left lower ankle and foot pain.  Has been ongoing for a few weeks.  It is a dull achy and worse with walking.   Review of Systems See HPI   HISTORY: Past Medical, Surgical, Social, and Family History Reviewed & Updated per EMR.   Pertinent Historical Findings include:  Past Medical History:  Diagnosis Date  . Alkaline phosphatase raised 06/28/2011   Fluctuating - suspect due to sarcoidosis  . Allergic rhinitis   . Allergy   . Arthritis    knees  . Genital herpes   . Hemorrhoids   . Hyperglycemia   . Hyperplastic lymph node 06/27/2011   Submental node excised 1/02; regrowth: re-excision 10/06  . Hypothyroidism   . Multinodular goiter   . Routine general medical examination at a health care facility   . Sarcoidosis   . Sleep apnea    wears cpap     Past Surgical History:  Procedure Laterality Date  . COLONOSCOPY  5-23/2003  . ROTATOR CUFF REPAIR Bilateral [id number]  . THYROIDECTOMY  2002    Family History  Problem Relation Age of Onset  . Asthma Cousin   . Sarcoidosis Neg Hx   . Colon polyps Neg Hx   . Colon cancer Neg Hx   . Esophageal cancer Neg Hx   . Stomach cancer Neg Hx   . Rectal cancer Neg Hx     Social History   Socioeconomic History  . Marital status: Married    Spouse name: Not on file  . Number of children: Not on file  . Years of education: Not on file  . Highest education level: Not on file  Occupational History  . Not on file  Tobacco Use  . Smoking status: Never Smoker  . Smokeless tobacco: Never Used  Substance and Sexual Activity  . Alcohol use: Yes    Comment: 3 times per month  . Drug use: Never  . Sexual activity: Not on file  Other Topics Concern  . Not on file  Social History Narrative  . Not on file   Social  Determinants of Health   Financial Resource Strain: Low Risk   . Difficulty of Paying Living Expenses: Not hard at all  Food Insecurity: No Food Insecurity  . Worried About Charity fundraiser in the Last Year: Never true  . Ran Out of Food in the Last Year: Never true  Transportation Needs: No Transportation Needs  . Lack of Transportation (Medical): No  . Lack of Transportation (Non-Medical): No  Physical Activity: Sufficiently Active  . Days of Exercise per Week: 5 days  . Minutes of Exercise per Session: 30 min  Stress: No Stress Concern Present  . Feeling of Stress : Not at all  Social Connections: Unknown  . Frequency of Communication with Friends and Family: More than three times a week  . Frequency of Social Gatherings with Friends and Family: Never  . Attends Religious Services: Patient refused  . Active Member of Clubs or Organizations: Patient refused  . Attends Archivist Meetings: Patient refused  . Marital Status: Married  Human resources officer Violence: Not on file     PHYSICAL EXAM:  VS: BP (!) 146/62   Pulse (!) 103   Ht  5' 7"  (1.702 m)   Wt 200 lb (90.7 kg)   SpO2 98%   BMI 31.32 kg/m  Physical Exam Gen: NAD, alert, cooperative with exam, well-appearing MSK:  Left foot: Pes planus. Tenderness palpation of the posterior tibialis and insertion. No redness or swelling. Neurovascular intact  Limited ultrasound: Left foot and ankle:  Enlargement of the posterior tibialis with an effusion encircling the tendon. Hyperemia associated around the tendon and at the insertion. No changes of the ankle joint  Summary: Findings consistent with posterior tibialis tendinitis  Ultrasound and interpretation by Clearance Coots, MD    ASSESSMENT & PLAN:   Posterior tibial tendinitis of left lower extremity Acute on chronic in nature. Has significant planus foot.  -Counseled on home exercise therapy and supportive care. -Scaphoid pads placed  bilaterally. -Prednisone. -Could consider injection, imaging or physical therapy.

## 2020-05-28 NOTE — Assessment & Plan Note (Signed)
Acute on chronic in nature. Has significant planus foot.  -Counseled on home exercise therapy and supportive care. -Scaphoid pads placed bilaterally. -Prednisone. -Could consider injection, imaging or physical therapy.

## 2020-06-02 ENCOUNTER — Other Ambulatory Visit: Payer: Self-pay | Admitting: Family

## 2020-06-07 DIAGNOSIS — H5203 Hypermetropia, bilateral: Secondary | ICD-10-CM | POA: Diagnosis not present

## 2020-06-07 DIAGNOSIS — H52209 Unspecified astigmatism, unspecified eye: Secondary | ICD-10-CM | POA: Diagnosis not present

## 2020-06-07 DIAGNOSIS — H524 Presbyopia: Secondary | ICD-10-CM | POA: Diagnosis not present

## 2020-06-22 ENCOUNTER — Other Ambulatory Visit: Payer: Self-pay

## 2020-06-22 ENCOUNTER — Encounter: Payer: Self-pay | Admitting: Family Medicine

## 2020-06-22 ENCOUNTER — Ambulatory Visit: Payer: Medicare HMO | Admitting: Family Medicine

## 2020-06-22 DIAGNOSIS — M76822 Posterior tibial tendinitis, left leg: Secondary | ICD-10-CM | POA: Diagnosis not present

## 2020-06-22 NOTE — Progress Notes (Signed)
Philip Woodard - 70 y.o. male MRN [id number]  Date of birth: 1950/05/29  SUBJECTIVE:  Including CC & ROS.  No chief complaint on file.   Philip Woodard is a 70 y.o. male that is following up for his left foot pain.  He has got significant improvement with the prednisone.  He also found improvement with additional padding.   Review of Systems See HPI   HISTORY: Past Medical, Surgical, Social, and Family History Reviewed & Updated per EMR.   Pertinent Historical Findings include:  Past Medical History:  Diagnosis Date  . Alkaline phosphatase raised 06/28/2011   Fluctuating - suspect due to sarcoidosis  . Allergic rhinitis   . Allergy   . Arthritis    knees  . Genital herpes   . Hemorrhoids   . Hyperglycemia   . Hyperplastic lymph node 06/27/2011   Submental node excised 1/02; regrowth: re-excision 10/06  . Hypothyroidism   . Multinodular goiter   . Routine general medical examination at a health care facility   . Sarcoidosis   . Sleep apnea    wears cpap     Past Surgical History:  Procedure Laterality Date  . COLONOSCOPY  5-23/2003  . ROTATOR CUFF REPAIR Bilateral [id number]  . THYROIDECTOMY  2002    Family History  Problem Relation Age of Onset  . Asthma Cousin   . Sarcoidosis Neg Hx   . Colon polyps Neg Hx   . Colon cancer Neg Hx   . Esophageal cancer Neg Hx   . Stomach cancer Neg Hx   . Rectal cancer Neg Hx     Social History   Socioeconomic History  . Marital status: Married    Spouse name: Not on file  . Number of children: Not on file  . Years of education: Not on file  . Highest education level: Not on file  Occupational History  . Not on file  Tobacco Use  . Smoking status: Never Smoker  . Smokeless tobacco: Never Used  Substance and Sexual Activity  . Alcohol use: Yes    Comment: 3 times per month  . Drug use: Never  . Sexual activity: Not on file  Other Topics Concern  . Not on file  Social History Narrative  . Not on file   Social  Determinants of Health   Financial Resource Strain: Low Risk   . Difficulty of Paying Living Expenses: Not hard at all  Food Insecurity: No Food Insecurity  . Worried About Charity fundraiser in the Last Year: Never true  . Ran Out of Food in the Last Year: Never true  Transportation Needs: No Transportation Needs  . Lack of Transportation (Medical): No  . Lack of Transportation (Non-Medical): No  Physical Activity: Sufficiently Active  . Days of Exercise per Week: 5 days  . Minutes of Exercise per Session: 30 min  Stress: No Stress Concern Present  . Feeling of Stress : Not at all  Social Connections: Unknown  . Frequency of Communication with Friends and Family: More than three times a week  . Frequency of Social Gatherings with Friends and Family: Never  . Attends Religious Services: Patient refused  . Active Member of Clubs or Organizations: Patient refused  . Attends Archivist Meetings: Patient refused  . Marital Status: Married  Human resources officer Violence: Not on file     PHYSICAL EXAM:  VS: BP (!) 168/70 (BP Location: Left Arm, Patient Position: Sitting, Cuff Size: Large)   Ht  5' 7"  (1.702 m)   Wt 200 lb (90.7 kg)   BMI 31.32 kg/m  Physical Exam Gen: NAD, alert, cooperative with exam, well-appearing    ASSESSMENT & PLAN:   Posterior tibial tendinitis of left lower extremity Has got significant improvement with the prednisone additional padding. -Counseled on home exercise therapy and supportive care. -Continue padding -Could consider lab work or physical therapy.

## 2020-06-22 NOTE — Patient Instructions (Signed)
Good to see you Please use ice as needed  Please continue with the exercises   Please send me a message in MyChart with any questions or updates.  Please see [place] back as needed.   --Dr. Raeford Razor

## 2020-06-23 NOTE — Assessment & Plan Note (Signed)
Has got significant improvement with the prednisone additional padding. -Counseled on home exercise therapy and supportive care. -Continue padding -Could consider lab work or physical therapy.

## 2020-06-30 ENCOUNTER — Encounter: Payer: Self-pay | Admitting: Family

## 2020-06-30 ENCOUNTER — Other Ambulatory Visit: Payer: Self-pay

## 2020-06-30 ENCOUNTER — Ambulatory Visit (INDEPENDENT_AMBULATORY_CARE_PROVIDER_SITE_OTHER): Payer: Medicare HMO | Admitting: Family

## 2020-06-30 VITALS — BP 160/80 | HR 68 | Temp 98.1°F | Ht 67.0 in | Wt 203.8 lb

## 2020-06-30 DIAGNOSIS — I10 Essential (primary) hypertension: Secondary | ICD-10-CM | POA: Diagnosis not present

## 2020-06-30 DIAGNOSIS — L989 Disorder of the skin and subcutaneous tissue, unspecified: Secondary | ICD-10-CM

## 2020-06-30 LAB — CBC WITH DIFFERENTIAL/PLATELET
Basophils Absolute: 0.1 10*3/uL (ref 0.0–0.1)
Basophils Relative: 0.6 % (ref 0.0–3.0)
Eosinophils Absolute: 0.3 10*3/uL (ref 0.0–0.7)
Eosinophils Relative: 3.4 % (ref 0.0–5.0)
HCT: 41.2 % (ref 39.0–52.0)
Hemoglobin: 13.8 g/dL (ref 13.0–17.0)
Lymphocytes Relative: 26.7 % (ref 12.0–46.0)
Lymphs Abs: 2.5 10*3/uL (ref 0.7–4.0)
MCHC: 33.4 g/dL (ref 30.0–36.0)
MCV: 89.7 fl (ref 78.0–100.0)
Monocytes Absolute: 1.1 10*3/uL — ABNORMAL HIGH (ref 0.1–1.0)
Monocytes Relative: 11.3 % (ref 3.0–12.0)
Neutro Abs: 5.5 10*3/uL (ref 1.4–7.7)
Neutrophils Relative %: 58 % (ref 43.0–77.0)
Platelets: 265 10*3/uL (ref 150.0–400.0)
RBC: 4.59 Mil/uL (ref 4.22–5.81)
RDW: 15 % (ref 11.5–15.5)
WBC: 9.5 10*3/uL (ref 4.0–10.5)

## 2020-06-30 LAB — COMPREHENSIVE METABOLIC PANEL WITH GFR
ALT: 26 U/L (ref 0–53)
AST: 30 U/L (ref 0–37)
Albumin: 4 g/dL (ref 3.5–5.2)
Alkaline Phosphatase: 66 U/L (ref 39–117)
BUN: 27 mg/dL — ABNORMAL HIGH (ref 6–23)
CO2: 29 meq/L (ref 19–32)
Calcium: 9.5 mg/dL (ref 8.4–10.5)
Chloride: 104 meq/L (ref 96–112)
Creatinine, Ser: 1.39 mg/dL (ref 0.40–1.50)
GFR: 52.26 mL/min — ABNORMAL LOW
Glucose, Bld: 81 mg/dL (ref 70–99)
Potassium: 4.1 meq/L (ref 3.5–5.1)
Sodium: 140 meq/L (ref 135–145)
Total Bilirubin: 0.6 mg/dL (ref 0.2–1.2)
Total Protein: 7.1 g/dL (ref 6.0–8.3)
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
## 2020-07-01 ENCOUNTER — Encounter: Payer: Self-pay | Admitting: Family

## 2020-07-02 ENCOUNTER — Other Ambulatory Visit: Payer: Self-pay | Admitting: Family

## 2020-07-11 ENCOUNTER — Encounter: Payer: Self-pay | Admitting: Family

## 2020-07-12 ENCOUNTER — Other Ambulatory Visit: Payer: Self-pay | Admitting: Family

## 2020-07-12 DIAGNOSIS — R053 Chronic cough: Secondary | ICD-10-CM

## 2020-07-13 ENCOUNTER — Other Ambulatory Visit: Payer: Self-pay

## 2020-07-13 ENCOUNTER — Ambulatory Visit (INDEPENDENT_AMBULATORY_CARE_PROVIDER_SITE_OTHER)
Admission: RE | Admit: 2020-07-13 | Discharge: 2020-07-13 | Disposition: A | Discharge status: Home / Self Care | Payer: Medicare HMO | Source: Ambulatory Visit | Attending: Family | Admitting: Family

## 2020-07-13 DIAGNOSIS — R053 Chronic cough: Secondary | ICD-10-CM

## 2020-07-13 DIAGNOSIS — R059 Cough, unspecified: Secondary | ICD-10-CM | POA: Diagnosis not present

## 2020-07-14 ENCOUNTER — Other Ambulatory Visit: Payer: Self-pay | Admitting: Family

## 2020-07-14 ENCOUNTER — Encounter: Payer: Self-pay | Admitting: Family

## 2020-07-14 DIAGNOSIS — D869 Sarcoidosis, unspecified: Secondary | ICD-10-CM

## 2020-08-03 ENCOUNTER — Other Ambulatory Visit: Payer: Self-pay | Admitting: Family

## 2020-08-03 MED ORDER — VALSARTAN 80 MG PO TABS: 80.0000 mg | ORAL_TABLET | Freq: Every day | ORAL | 0 refills | Status: DC

## 2020-08-04 ENCOUNTER — Other Ambulatory Visit: Payer: Self-pay | Admitting: Family

## 2020-10-24 ENCOUNTER — Encounter: Payer: Self-pay | Admitting: Family

## 2020-10-25 MED ORDER — ACYCLOVIR 800 MG PO TABS: 800.0000 mg | ORAL_TABLET | Freq: Every day | ORAL | 0 refills | Status: DC

## 2020-11-03 ENCOUNTER — Telehealth: Payer: Self-pay

## 2020-11-05 ENCOUNTER — Other Ambulatory Visit: Payer: Self-pay | Admitting: Family

## 2020-11-05 ENCOUNTER — Encounter: Payer: Self-pay | Admitting: Family

## 2020-11-05 MED ORDER — VALSARTAN 80 MG PO TABS: 80.0000 mg | ORAL_TABLET | Freq: Every day | ORAL | 0 refills | Status: DC

## 2020-11-17 DIAGNOSIS — G4733 Obstructive sleep apnea (adult) (pediatric): Secondary | ICD-10-CM | POA: Diagnosis not present

## 2020-11-25 DIAGNOSIS — L815 Leukoderma, not elsewhere classified: Secondary | ICD-10-CM | POA: Diagnosis not present

## 2020-12-05 ENCOUNTER — Encounter: Payer: Self-pay | Admitting: Family

## 2020-12-13 ENCOUNTER — Ambulatory Visit: Payer: Medicare HMO | Admitting: Family Medicine

## 2020-12-13 VITALS — Ht 67.0 in | Wt 195.0 lb

## 2020-12-13 DIAGNOSIS — M76821 Posterior tibial tendinitis, right leg: Secondary | ICD-10-CM

## 2020-12-13 DIAGNOSIS — M8430XA Stress fracture, unspecified site, initial encounter for fracture: Secondary | ICD-10-CM | POA: Insufficient documentation

## 2020-12-13 MED ORDER — PREDNISONE 5 MG PO TABS: ORAL_TABLET | ORAL | 0 refills | Status: DC

## 2020-12-13 NOTE — Assessment & Plan Note (Signed)
Has had tendinitis of the posterior tib on the left side.  He does have flatfeet which likely exacerbate his symptoms.  He has been driving a lot and he normally does. -Counseled on home exercise therapy and supportive care. -Prednisone. -Cam walker. -Could consider injection physical therapy.

## 2020-12-13 NOTE — Patient Instructions (Signed)
Good to see you Please use the boot as needed  Please try the medicine  You can start the exercises when the pain has improved  Please send me a message in MyChart with any questions or updates.  Please see me back in 4 weeks.   --Dr. Raeford Razor

## 2020-12-13 NOTE — Progress Notes (Signed)
BRET VANESSEN - 70 y.o. male MRN [id number]  Date of birth: 08-19-1950  SUBJECTIVE:  Including CC & ROS.  No chief complaint on file.   HALE CHALFIN is a 70 y.o. male that is presenting with acute right foot and ankle pain.  The pain is been ongoing for the past few days.  It got severe to where he was limping around at work.  Has had similar instances in the past.   Review of Systems See HPI   HISTORY: Past Medical, Surgical, Social, and Family History Reviewed & Updated per EMR.   Pertinent Historical Findings include:  Past Medical History:  Diagnosis Date   Alkaline phosphatase raised 06/28/2011   Fluctuating - suspect due to sarcoidosis   Allergic rhinitis    Allergy    Arthritis    knees   Genital herpes    Hemorrhoids    Hyperglycemia    Hyperplastic lymph node 06/27/2011   Submental node excised 1/02; regrowth: re-excision 10/06   Hypothyroidism    Multinodular goiter    Routine general medical examination at a health care facility    Sarcoidosis    Sleep apnea    wears cpap     Past Surgical History:  Procedure Laterality Date   COLONOSCOPY  5-23/2003   ROTATOR CUFF REPAIR Bilateral [id number]   THYROIDECTOMY  2002    Family History  Problem Relation Age of Onset   Asthma Cousin    Sarcoidosis Neg Hx    Colon polyps Neg Hx    Colon cancer Neg Hx    Esophageal cancer Neg Hx    Stomach cancer Neg Hx    Rectal cancer Neg Hx     Social History   Socioeconomic History   Marital status: Married    Spouse name: Not on file   Number of children: Not on file   Years of education: Not on file   Highest education level: Not on file  Occupational History   Not on file  Tobacco Use   Smoking status: Never   Smokeless tobacco: Never  Substance and Sexual Activity   Alcohol use: Yes    Comment: 3 times per month   Drug use: Never   Sexual activity: Not on file  Other Topics Concern   Not on file  Social History Narrative   Not on file   Social  Determinants of Health   Financial Resource Strain: Low Risk    Difficulty of Paying Living Expenses: Not hard at all  Food Insecurity: No Food Insecurity   Worried About Charity fundraiser in the Last Year: Never true   Arboriculturist in the Last Year: Never true  Transportation Needs: No Transportation Needs   Lack of Transportation (Medical): No   Lack of Transportation (Non-Medical): No  Physical Activity: Sufficiently Active   Days of Exercise per Week: 5 days   Minutes of Exercise per Session: 30 min  Stress: No Stress Concern Present   Feeling of Stress : Not at all  Social Connections: Unknown   Frequency of Communication with Friends and Family: More than three times a week   Frequency of Social Gatherings with Friends and Family: Never   Attends Religious Services: Patient refused   Marine scientist or Organizations: Patient refused   Attends Archivist Meetings: Patient refused   Marital Status: Married  Human resources officer Violence: Not on file     PHYSICAL EXAM:  VS: Ht 5' 7"  (  1.702 m)   Wt 195 lb (88.5 kg)   BMI 30.54 kg/m  Physical Exam Gen: NAD, alert, cooperative with exam, well-appearing      ASSESSMENT & PLAN:   Posterior tibial tendinitis of right lower extremity Has had tendinitis of the posterior tib on the left side.  He does have flatfeet which likely exacerbate his symptoms.  He has been driving a lot and he normally does. -Counseled on home exercise therapy and supportive care. -Prednisone. -Cam walker. -Could consider injection physical therapy.

## 2020-12-28 ENCOUNTER — Telehealth: Payer: Self-pay | Admitting: Family

## 2020-12-28 ENCOUNTER — Other Ambulatory Visit: Payer: Self-pay | Admitting: Family

## 2020-12-28 NOTE — Telephone Encounter (Signed)
I have called pt to give him a curtsy call to see how he was doing. There was no answer so I left a message to call back.

## 2020-12-28 NOTE — Telephone Encounter (Signed)
Please call and check on him regarding his blood pressure. Has he been able to try cutting the Valsartan in 1/2? Is he feeling any better?  What is his blood pressure doing these days?

## 2020-12-29 NOTE — Telephone Encounter (Signed)
Called but no answer, lvm for patient to be aware Mickel Baas wanted to know how he was doning and to call back.

## 2020-12-30 NOTE — Telephone Encounter (Signed)
FYI Spoke to patient and he reports he is still taking a whole tablet due to not able to cut. "Is going to get a pill cutter today" BP check at pharmacy on a average of 118/65.  He is feeling better and is able to focus better.

## 2021-01-10 ENCOUNTER — Ambulatory Visit: Payer: Medicare HMO | Admitting: Family Medicine

## 2021-01-10 VITALS — BP 128/76 | Ht 67.0 in | Wt 195.0 lb

## 2021-01-10 DIAGNOSIS — M76821 Posterior tibial tendinitis, right leg: Secondary | ICD-10-CM | POA: Diagnosis not present

## 2021-01-10 NOTE — Assessment & Plan Note (Signed)
Having improvement of his pain and swelling but still occurring at the navicular at the insertion. -Counseled on home exercise therapy and supportive care. -Counseled on using the cam walker. -Referral to physical therapy. -Could consider injection.

## 2021-01-10 NOTE — Progress Notes (Signed)
Philip Woodard - 70 y.o. male MRN [id number]  Date of birth: 1950/06/22  SUBJECTIVE:  Including CC & ROS.  No chief complaint on file.   Philip Woodard is a 70 y.o. male that is following up for his right foot pain.  He uses the cam walker intermittently.  Does feel pain after few hours of walking in a normal shoe.   Review of Systems See HPI   HISTORY: Past Medical, Surgical, Social, and Family History Reviewed & Updated per EMR.   Pertinent Historical Findings include:  Past Medical History:  Diagnosis Date   Alkaline phosphatase raised 06/28/2011   Fluctuating - suspect due to sarcoidosis   Allergic rhinitis    Allergy    Arthritis    knees   Genital herpes    Hemorrhoids    Hyperglycemia    Hyperplastic lymph node 06/27/2011   Submental node excised 1/02; regrowth: re-excision 10/06   Hypothyroidism    Multinodular goiter    Routine general medical examination at a health care facility    Sarcoidosis    Sleep apnea    wears cpap     Past Surgical History:  Procedure Laterality Date   COLONOSCOPY  5-23/2003   ROTATOR CUFF REPAIR Bilateral [id number]   THYROIDECTOMY  2002    Family History  Problem Relation Age of Onset   Asthma Cousin    Sarcoidosis Neg Hx    Colon polyps Neg Hx    Colon cancer Neg Hx    Esophageal cancer Neg Hx    Stomach cancer Neg Hx    Rectal cancer Neg Hx     Social History   Socioeconomic History   Marital status: Married    Spouse name: Not on file   Number of children: Not on file   Years of education: Not on file   Highest education level: Not on file  Occupational History   Not on file  Tobacco Use   Smoking status: Never   Smokeless tobacco: Never  Substance and Sexual Activity   Alcohol use: Yes    Comment: 3 times per month   Drug use: Never   Sexual activity: Not on file  Other Topics Concern   Not on file  Social History Narrative   Not on file   Social Determinants of Health   Financial Resource Strain:  Low Risk    Difficulty of Paying Living Expenses: Not hard at all  Food Insecurity: No Food Insecurity   Worried About Charity fundraiser in the Last Year: Never true   Arboriculturist in the Last Year: Never true  Transportation Needs: No Transportation Needs   Lack of Transportation (Medical): No   Lack of Transportation (Non-Medical): No  Physical Activity: Sufficiently Active   Days of Exercise per Week: 5 days   Minutes of Exercise per Session: 30 min  Stress: No Stress Concern Present   Feeling of Stress : Not at all  Social Connections: Unknown   Frequency of Communication with Friends and Family: More than three times a week   Frequency of Social Gatherings with Friends and Family: Never   Attends Religious Services: Patient refused   Marine scientist or Organizations: Patient refused   Attends Archivist Meetings: Patient refused   Marital Status: Married  Human resources officer Violence: Not on file     PHYSICAL EXAM:  VS: BP 128/76   Ht 5' 7"  (1.702 m)   Wt 195 lb (88.5  kg)   BMI 30.54 kg/m  Physical Exam Gen: NAD, alert, cooperative with exam, well-appearing      ASSESSMENT & PLAN:   Posterior tibial tendinitis of right lower extremity Having improvement of his pain and swelling but still occurring at the navicular at the insertion. -Counseled on home exercise therapy and supportive care. -Counseled on using the cam walker. -Referral to physical therapy. -Could consider injection.

## 2021-01-10 NOTE — Patient Instructions (Signed)
Good to see you Please use ice as needed  Please try physical therapy   Please send me a message in MyChart with any questions or updates.  Please see me back in 4-6 weeks.   --Dr. Raeford Razor

## 2021-01-20 ENCOUNTER — Other Ambulatory Visit: Payer: Self-pay | Admitting: Family

## 2021-01-21 ENCOUNTER — Ambulatory Visit: Payer: Self-pay

## 2021-01-25 ENCOUNTER — Other Ambulatory Visit: Payer: Self-pay | Admitting: Family

## 2021-02-02 ENCOUNTER — Other Ambulatory Visit: Payer: Self-pay | Admitting: Family

## 2021-02-07 ENCOUNTER — Encounter: Payer: Self-pay | Admitting: Family

## 2021-02-09 ENCOUNTER — Other Ambulatory Visit: Payer: Self-pay

## 2021-02-09 ENCOUNTER — Ambulatory Visit: Payer: Medicare HMO | Admitting: Family Medicine

## 2021-02-09 ENCOUNTER — Encounter: Payer: Self-pay | Admitting: Family Medicine

## 2021-02-09 ENCOUNTER — Ambulatory Visit (HOSPITAL_BASED_OUTPATIENT_CLINIC_OR_DEPARTMENT_OTHER)
Admission: RE | Admit: 2021-02-09 | Discharge: 2021-02-09 | Disposition: A | Discharge status: Home / Self Care | Payer: Medicare HMO | Source: Ambulatory Visit | Attending: Family Medicine | Admitting: Family Medicine

## 2021-02-09 VITALS — BP 158/70 | Ht 67.0 in | Wt 195.0 lb

## 2021-02-09 DIAGNOSIS — M76821 Posterior tibial tendinitis, right leg: Secondary | ICD-10-CM | POA: Diagnosis not present

## 2021-02-09 DIAGNOSIS — M79671 Pain in right foot: Secondary | ICD-10-CM | POA: Diagnosis not present

## 2021-02-09 NOTE — Assessment & Plan Note (Signed)
Acutely worsening.  Has tried medications a cam walker as well as home exercise regimen with limited improvement. -Counseled on home exercise therapy and supportive care. -X-ray. -MRI of the right ankle to evaluate for a tear of the posterior tibialis tendon

## 2021-02-09 NOTE — Progress Notes (Signed)
  Philip Woodard - 70 y.o. male MRN [id number]  Date of birth: 13-Jul-1950  SUBJECTIVE:  Including CC & ROS.  No chief complaint on file.   Philip Woodard is a 70 y.o. male that is presenting with acute worsening of this right ankle pain.  This posterior tibialis's causes pain intermittently the more active that he is on it.  He has to use the boot to help with the pain.  He has tried over 6 weeks of home exercise therapy as well as nitroglycerin and medication therapy.   Review of Systems See HPI   HISTORY: Past Medical, Surgical, Social, and Family History Reviewed & Updated per EMR.   Pertinent Historical Findings include:  Past Medical History:  Diagnosis Date   Alkaline phosphatase raised 06/28/2011   Fluctuating - suspect due to sarcoidosis   Allergic rhinitis    Allergy    Arthritis    knees   Genital herpes    Hemorrhoids    Hyperglycemia    Hyperplastic lymph node 06/27/2011   Submental node excised 1/02; regrowth: re-excision 10/06   Hypothyroidism    Multinodular goiter    Routine general medical examination at a health care facility    Sarcoidosis    Sleep apnea    wears cpap     Past Surgical History:  Procedure Laterality Date   COLONOSCOPY  5-23/2003   ROTATOR CUFF REPAIR Bilateral [id number]   THYROIDECTOMY  2002    Family History  Problem Relation Age of Onset   Asthma Cousin    Sarcoidosis Neg Hx    Colon polyps Neg Hx    Colon cancer Neg Hx    Esophageal cancer Neg Hx    Stomach cancer Neg Hx    Rectal cancer Neg Hx     Social History   Socioeconomic History   Marital status: Married    Spouse name: Not on file   Number of children: Not on file   Years of education: Not on file   Highest education level: Not on file  Occupational History   Not on file  Tobacco Use   Smoking status: Never   Smokeless tobacco: Never  Substance and Sexual Activity   Alcohol use: Yes    Comment: 3 times per month   Drug use: Never   Sexual activity:  Not on file  Other Topics Concern   Not on file  Social History Narrative   Not on file   Social Determinants of Health   Financial Resource Strain: Not on file  Food Insecurity: Not on file  Transportation Needs: Not on file  Physical Activity: Not on file  Stress: Not on file  Social Connections: Not on file  Intimate Partner Violence: Not on file     PHYSICAL EXAM:  VS: BP (!) 158/70 (BP Location: Left Arm, Patient Position: Sitting)   Ht 5' 7"  (1.702 m)   Wt 195 lb (88.5 kg)   BMI 30.54 kg/m  Physical Exam Gen: NAD, alert, cooperative with exam, well-appearing    ASSESSMENT & PLAN:   Posterior tibial tendinitis of right lower extremity Acutely worsening.  Has tried medications a cam walker as well as home exercise regimen with limited improvement. -Counseled on home exercise therapy and supportive care. -X-ray. -MRI of the right ankle to evaluate for a tear of the posterior tibialis tendon

## 2021-02-09 NOTE — Patient Instructions (Signed)
Good to see you Please use the boot as needed  I will call with the xray results.  We'll get the MRI at [facility]   Please send me a message in [place] with any questions or updates.  We'll setup a virtual visit once the MRI is resulted.   --Dr. Raeford Razor

## 2021-02-11 ENCOUNTER — Telehealth: Payer: Self-pay | Admitting: Family Medicine

## 2021-02-11 DIAGNOSIS — M5136 Other intervertebral disc degeneration, lumbar region: Secondary | ICD-10-CM | POA: Diagnosis not present

## 2021-02-11 DIAGNOSIS — M5416 Radiculopathy, lumbar region: Secondary | ICD-10-CM | POA: Diagnosis not present

## 2021-02-11 DIAGNOSIS — M9903 Segmental and somatic dysfunction of lumbar region: Secondary | ICD-10-CM | POA: Diagnosis not present

## 2021-02-11 NOTE — Telephone Encounter (Signed)
Left VM for patient. If he calls back please have him speak with a nurse/CMA and inform that his xray is showing spurring but not the reason to the source of his pain. We'll continue with the MRI .   If any questions then please take the best time and phone number to call and I will try to call him back.   Rosemarie Ax, MD [facility] 02/11/2021, 12:25 PM

## 2021-02-13 DIAGNOSIS — R509 Fever, unspecified: Secondary | ICD-10-CM | POA: Diagnosis not present

## 2021-02-13 DIAGNOSIS — Z20822 Contact with and (suspected) exposure to covid-19: Secondary | ICD-10-CM | POA: Diagnosis not present

## 2021-02-13 DIAGNOSIS — J101 Influenza due to other identified influenza virus with other respiratory manifestations: Secondary | ICD-10-CM | POA: Diagnosis not present

## 2021-02-14 ENCOUNTER — Ambulatory Visit: Payer: Medicare HMO | Admitting: Family Medicine

## 2021-02-15 ENCOUNTER — Ambulatory Visit: Payer: Self-pay

## 2021-02-15 NOTE — Telephone Encounter (Signed)
Pt informed of below.  

## 2021-02-16 ENCOUNTER — Institutional Professional Consult (permissible substitution): Payer: Medicare HMO | Admitting: Student

## 2021-02-18 ENCOUNTER — Ambulatory Visit: Payer: Self-pay

## 2021-02-23 ENCOUNTER — Telehealth: Payer: Self-pay | Admitting: Family

## 2021-02-23 NOTE — Telephone Encounter (Signed)
LVM for pt to rtn my call to schedule AWV with [facility]. Please schedule AWV if patient calls the office.

## 2021-02-23 NOTE — Progress Notes (Signed)
Synopsis: Referred for sarcoid by Marrian Salvage,*  Subjective:   PATIENT ID: Philip Woodard GENDER: male DOB: 1951-01-02, MRN: [id number]  Chief Complaint  Patient presents with   Consult    Pt states off and on indigestion    70yM with history of sarcoid (TBLB with [place] granuloma and neg AFB/fungal stains in 2002), OSA on CPAP, multinodular goiter, rhinitis  Regarding his sarcoidosis he was only only prednisone for 3 months after diagnosis in 2002.   He has no trouble breathing, including with exertion, no cough. No CP, vision changes including no photophobia, palpitations.   Some indigestion 2-3x per week. Usually after eating. Especially at night, can frequently have sour taste in his mouth. Takes tums with some improvement. He thinks he was on ppi at some point in the past - didn't want to remain on it indefinitely.  Takes claritin consistently, flonase intermittently for rhinitis. He thinks claritin is more helpful though.   Otherwise pertinent review of systems is negative.  Two aunts on father's side probably had sarcoid as well  He works as a Landscape architect. He has never lived outside of [place]. Went to [facility] for thanksgiving. No pets at home. Never smoker.    Past Medical History:  Diagnosis Date   Alkaline phosphatase raised 06/28/2011   Fluctuating - suspect due to sarcoidosis   Allergic rhinitis    Allergy    Arthritis    knees   Genital herpes    Hemorrhoids    Hyperglycemia    Hyperplastic lymph node 06/27/2011   Submental node excised 1/02; regrowth: re-excision 10/06   Hypothyroidism    Multinodular goiter    Routine general medical examination at a health care facility    Sarcoidosis    Sleep apnea    wears cpap      Family History  Problem Relation Age of Onset   Asthma Cousin    Sarcoidosis Neg Hx    Colon polyps Neg Hx    Colon cancer Neg Hx    Esophageal cancer Neg Hx    Stomach cancer Neg Hx    Rectal cancer Neg Hx       Past Surgical History:  Procedure Laterality Date   COLONOSCOPY  5-23/2003   ROTATOR CUFF REPAIR Bilateral [id number]   THYROIDECTOMY  2002    Social History   Socioeconomic History   Marital status: Married    Spouse name: Not on file   Number of children: Not on file   Years of education: Not on file   Highest education level: Not on file  Occupational History   Not on file  Tobacco Use   Smoking status: Never   Smokeless tobacco: Never  Substance and Sexual Activity   Alcohol use: Yes    Comment: 3 times per month   Drug use: Never   Sexual activity: Not on file  Other Topics Concern   Not on file  Social History Narrative   Not on file   Social Determinants of Health   Financial Resource Strain: Not on file  Food Insecurity: Not on file  Transportation Needs: Not on file  Physical Activity: Not on file  Stress: Not on file  Social Connections: Not on file  Intimate Partner Violence: Not on file     Allergies  Allergen Reactions   Other     SEAFOOD     Outpatient Medications Prior to Visit  Medication Sig Dispense Refill   acetaminophen (TYLENOL) 650 MG  CR tablet Take 650 mg by mouth every 8 (eight) hours as needed.     acyclovir (ZOVIRAX) 800 MG tablet TAKE 1 TABLET BY MOUTH DAILY. 90 tablet 0   amLODipine (NORVASC) 10 MG tablet TAKE 1 TABLET BY MOUTH EVERY DAY 90 tablet 0   cholecalciferol (VITAMIN D3) 25 MCG (1000 UT) tablet Take 1,000 Units by mouth daily.     Glucosamine-Chondroit-Vit C-Mn (GLUCOSAMINE 1500 COMPLEX) CAPS Take by mouth.     levothyroxine (SYNTHROID) 175 MCG tablet TAKE 1 TABLET BY MOUTH EVERY DAY 90 tablet 0   Multiple Vitamin (MULTIVITAMIN WITH MINERALS) TABS tablet Take 1 tablet by mouth daily.     Omega-3 Fatty Acids (FISH OIL) 1000 MG CAPS Take 2 capsules by mouth.     Turmeric 500 MG TABS Take 1 tablet by mouth 2 (two) times daily.     valsartan (DIOVAN) 80 MG tablet TAKE 1 TABLET BY MOUTH EVERY DAY 30 tablet 0   zinc  gluconate 50 MG tablet Take 50 mg by mouth daily.     predniSONE (DELTASONE) 5 MG tablet Take 6 pills for first day, 5 pills second day, 4 pills third day, 3 pills fourth day, 2 pills the fifth day, and 1 pill sixth day. (Patient not taking: Reported on 02/24/2021) 21 tablet 0   sildenafil (REVATIO) 20 MG tablet Take 5 tablets as directed 30-45 minutes prior to intercourse (Patient not taking: Reported on 06/30/2020) 50 tablet 0   No facility-administered medications prior to visit.       Objective:   Physical Exam:  General appearance: 70 y.o., male, NAD, conversant  Eyes: anicteric sclerae; PERRL, tracking appropriately HENT: NCAT; MMM Neck: Trachea midline; no lymphadenopathy, no JVD Lungs: CTAB, no crackles, no wheeze, with normal respiratory effort CV: RRR, no murmur  Abdomen: Soft, non-tender; non-distended, BS present  Extremities: No peripheral edema, warm Skin: Normal turgor and texture; no rash Psych: Appropriate affect Neuro: Alert and oriented to person and place, no focal deficit     Vitals:   02/24/21 0901  BP: 130/76  Pulse: 88  Temp: 98 F (36.7 C)  TempSrc: Oral  SpO2: 97%  Weight: 203 lb 12.8 oz (92.4 kg)  Height: 5' 7"  (1.702 m)   97% on RA BMI Readings from Last 3 Encounters:  02/24/21 31.92 kg/m  02/09/21 30.54 kg/m  01/10/21 30.54 kg/m   Wt Readings from Last 3 Encounters:  02/24/21 203 lb 12.8 oz (92.4 kg)  02/09/21 195 lb (88.5 kg)  01/10/21 195 lb (88.5 kg)     CBC    Component Value Date/Time   WBC 9.5 06/30/2020 0942   RBC 4.59 06/30/2020 0942   HGB 13.8 06/30/2020 0942   HGB 15.5 06/23/2011 1637   HCT 41.2 06/30/2020 0942   HCT 45.9 06/23/2011 1637   PLT 265.0 06/30/2020 0942   PLT 218 06/23/2011 1637   MCV 89.7 06/30/2020 0942   MCV 91.1 06/23/2011 1637   MCH 30.4 12/10/2013 1014   MCHC 33.4 06/30/2020 0942   RDW 15.0 06/30/2020 0942   RDW 14.4 06/23/2011 1637   LYMPHSABS 2.5 06/30/2020 0942   LYMPHSABS 3.0  06/23/2011 1637   MONOABS 1.1 (H) 06/30/2020 0942   MONOABS 0.6 06/23/2011 1637   EOSABS 0.3 06/30/2020 0942   EOSABS 0.3 06/23/2011 1637   BASOSABS 0.1 06/30/2020 0942   BASOSABS 0.1 06/23/2011 1637    Chest Imaging: CXR 2018 reviewed by me with prominent hila, interstitium   Echocardiogram:   Stress echo  2018:  NORMAL STRESS TEST. NORMAL RESTING STUDY WITH NO WALL MOTION ABNORMALITIES   AT REST AND PEAK STRESS.   NO DOPPLER PERFORMED FOR VALVULAR REGURGITATION   NO DOPPLER PERFORMED FOR VALVULAR STENOSIS   Note: BASELINE PROTOCOL   PATIENT WENT INTO SECOND DEGREE HEART BLOCK WHEN ENTERING RECOVERY FOLLOWED   BY A SHORT RUN OF JUNCTIONAL TACH. NO SYMPTOMS REPORTED   TTE 2018:   NORMAL LEFT VENTRICULAR SYSTOLIC FUNCTION WITH MILD LVH    NORMAL RIGHT VENTRICULAR SYSTOLIC FUNCTION    VALVULAR REGURGITATION: MILD AR, TRIVIAL MR, MILD PR, TRIVIAL TR    NO VALVULAR STENOSIS        Assessment & Plan:   # Sarcoidosis: Really doesn't sound symptomatic at present.   # GERD: Most bothersome issue today. Sounds like we have an opportunity to work on some lifestyle measures first  # OSA on CPAP: Adherent.  # Rhinitis:  Best symptomatic response to antihistamine.  Plan: - full PFTs next visit - EKG today - BMP today - has seen eye doctor this year - TLC measures including continued adherence to CPAP discussed to work on GERD which he'll try to implement, if failing he'll let us know so we can prescribe ppi      Maryjane Hurter, MD [facility] 02/24/2021 9:15 AM

## 2021-02-24 ENCOUNTER — Ambulatory Visit: Payer: Medicare HMO | Admitting: Student

## 2021-02-24 ENCOUNTER — Encounter: Payer: Self-pay | Admitting: Student

## 2021-02-24 ENCOUNTER — Other Ambulatory Visit: Payer: Self-pay

## 2021-02-24 VITALS — BP 130/76 | HR 88 | Temp 98.0°F | Ht 67.0 in | Wt 203.8 lb

## 2021-02-24 DIAGNOSIS — K219 Gastro-esophageal reflux disease without esophagitis: Secondary | ICD-10-CM | POA: Diagnosis not present

## 2021-02-24 DIAGNOSIS — G4733 Obstructive sleep apnea (adult) (pediatric): Secondary | ICD-10-CM | POA: Diagnosis not present

## 2021-02-24 DIAGNOSIS — Z9989 Dependence on other enabling machines and devices: Secondary | ICD-10-CM

## 2021-02-24 DIAGNOSIS — D869 Sarcoidosis, unspecified: Secondary | ICD-10-CM | POA: Diagnosis not present

## 2021-02-24 LAB — BASIC METABOLIC PANEL
BUN: 23 mg/dL (ref 6–23)
CO2: 29 mEq/L (ref 19–32)
Calcium: 9.7 mg/dL (ref 8.4–10.5)
Chloride: 103 mEq/L (ref 96–112)
Creatinine, Ser: 1.48 mg/dL (ref 0.40–1.50)
GFR: 48.24 mL/min — ABNORMAL LOW (ref >60.00)
Glucose, Bld: 84 mg/dL (ref 70–99)
Potassium: 3.9 mEq/L (ref 3.5–5.1)
Sodium: 138 mEq/L (ref 135–145)

## 2021-02-24 NOTE — Patient Instructions (Signed)
- PFTs (breathing tests) next visit in 3 months    Gastroesophageal Reflux Disease, Adult Gastroesophageal reflux (GER) happens when acid from the stomach flows up into the tube that connects the mouth and the stomach (esophagus). Normally, food travels down the esophagus and stays in the stomach to be digested. However, when a person has GER, food and stomach acid sometimes move back up into the esophagus. If this becomes a more serious problem, the person may be diagnosed with a disease called gastroesophageal reflux disease (GERD). GERD occurs when the reflux: Happens often. Causes frequent or severe symptoms. Causes problems such as damage to the esophagus. When stomach acid comes in contact with the esophagus, the acid may cause inflammation in the esophagus. Over time, GERD may create small holes (ulcers) in the lining of the esophagus. What are the causes? This condition is caused by a problem with the muscle between the esophagus and the stomach (lower esophageal sphincter, or LES). Normally, the LES muscle closes after food passes through the esophagus to the stomach. When the LES is weakened or abnormal, it does not close properly, and that allows food and stomach acid to go back up into the esophagus. The LES can be weakened by certain dietary substances, medicines, and medical conditions, including: Tobacco use. Pregnancy. Having a hiatal hernia. Alcohol use. Certain foods and beverages, such as coffee, chocolate, onions, and peppermint. What increases the risk? You are more likely to develop this condition if you: Have an increased body weight. Have a connective tissue disorder. Take NSAIDs, such as ibuprofen. What are the signs or symptoms? Symptoms of this condition include: Heartburn. Difficult or painful swallowing and the feeling of having a lump in the throat. A bitter taste in the mouth. Bad breath and having a large amount of saliva. Having an upset or bloated stomach  and belching. Chest pain. Different conditions can cause chest pain. Make sure you see your health care provider if you experience chest pain. Shortness of breath or wheezing. Ongoing (chronic) cough or a nighttime cough. Wearing away of tooth enamel. Weight loss. How is this diagnosed? This condition may be diagnosed based on a medical history and a physical exam. To determine if you have mild or severe GERD, your health care provider may also monitor how you respond to treatment. You may also have tests, including: A test to examine your stomach and esophagus with a small camera (endoscopy). A test that measures the acidity level in your esophagus. A test that measures how much pressure is on your esophagus. A barium swallow or modified barium swallow test to show the shape, size, and functioning of your esophagus. How is this treated? Treatment for this condition may vary depending on how severe your symptoms are. Your health care provider may recommend: Changes to your diet. Medicine. Surgery. The goal of treatment is to help relieve your symptoms and to prevent complications. Follow these instructions at home: Eating and drinking  Follow a diet as recommended by your health care provider. This may involve avoiding foods and drinks such as: Coffee and tea, with or without caffeine. Drinks that contain alcohol. Energy drinks and sports drinks. Carbonated drinks or sodas. Chocolate and cocoa. Peppermint and mint flavorings. Garlic and onions. Horseradish. Spicy and acidic foods, including peppers, chili powder, curry powder, vinegar, hot sauces, and barbecue sauce. Citrus fruit juices and citrus fruits, such as oranges, lemons, and limes. Tomato-based foods, such as red sauce, chili, salsa, and pizza with red sauce. [place] Pro and  fatty foods, such as donuts, french fries, potato chips, and high-fat dressings. High-fat meats, such as hot dogs and fatty cuts of red and white meats, such  as rib eye steak, sausage, ham, and bacon. High-fat dairy items, such as whole milk, butter, and cream cheese. Eat small, frequent meals instead of large meals. Avoid drinking large amounts of liquid with your meals. Avoid eating meals during the 2-3 hours before bedtime. Avoid lying down right after you eat. Do not exercise right after you eat. Lifestyle  Do not use any products that contain nicotine or tobacco. These products include cigarettes, chewing tobacco, and vaping devices, such as e-cigarettes. If you need help quitting, ask your health care provider. Try to reduce your stress by using methods such as yoga or meditation. If you need help reducing stress, ask your health care provider. If you are overweight, reduce your weight to an amount that is healthy for you. Ask your health care provider for guidance about a safe weight loss goal. General instructions Pay attention to any changes in your symptoms. Take over-the-counter and prescription medicines only as told by your health care provider. Do not take aspirin, ibuprofen, or other NSAIDs unless your health care provider told you to take these medicines. Wear loose-fitting clothing. Do not wear anything tight around your waist that causes pressure on your abdomen. Raise (elevate) the head of your bed about 6 inches (15 cm). You can use a wedge to do this. Avoid bending over if this makes your symptoms worse. Keep all follow-up visits. This is important. Contact a health care provider if: You have: New symptoms. Unexplained weight loss. Difficulty swallowing or it hurts to swallow. Wheezing or a persistent cough. A hoarse voice. Your symptoms do not improve with treatment. Get help right away if: You have sudden pain in your arms, neck, jaw, teeth, or back. You suddenly feel sweaty, dizzy, or light-headed. You have chest pain or shortness of breath. You vomit and the vomit is green, yellow, or black, or it looks like blood  or coffee grounds. You faint. You have stool that is red, bloody, or black. You cannot swallow, drink, or eat. These symptoms may represent a serious problem that is an emergency. Do not wait to see if the symptoms will go away. Get medical help right away. Call your local emergency services (911 in the [place] Do not drive yourself to the hospital. Summary Gastroesophageal reflux happens when acid from the stomach flows up into the esophagus. GERD is a disease in which the reflux happens often, causes frequent or severe symptoms, or causes problems such as damage to the esophagus. Treatment for this condition may vary depending on how severe your symptoms are. Your health care provider may recommend diet and lifestyle changes, medicine, or surgery. Contact a health care provider if you have new or worsening symptoms. Take over-the-counter and prescription medicines only as told by your health care provider. Do not take aspirin, ibuprofen, or other NSAIDs unless your health care provider told you to do so. Keep all follow-up visits as told by your health care provider. This is important. This information is not intended to replace advice given to you by your health care provider. Make sure you discuss any questions you have with your health care provider. Document Revised: 09/08/2019 Document Reviewed: 09/08/2019 Elsevier Patient Education  [place].

## 2021-02-25 ENCOUNTER — Telehealth: Payer: Self-pay | Admitting: Family

## 2021-02-25 NOTE — Telephone Encounter (Signed)
LVM for pt to rtn my call to schedule AWV with [facility].  

## 2021-02-27 ENCOUNTER — Ambulatory Visit (INDEPENDENT_AMBULATORY_CARE_PROVIDER_SITE_OTHER): Payer: Medicare HMO

## 2021-02-27 ENCOUNTER — Other Ambulatory Visit: Payer: Self-pay

## 2021-02-27 DIAGNOSIS — M76821 Posterior tibial tendinitis, right leg: Secondary | ICD-10-CM | POA: Diagnosis not present

## 2021-02-27 DIAGNOSIS — R600 Localized edema: Secondary | ICD-10-CM | POA: Diagnosis not present

## 2021-02-27 DIAGNOSIS — M25571 Pain in right ankle and joints of right foot: Secondary | ICD-10-CM | POA: Diagnosis not present

## 2021-03-02 ENCOUNTER — Encounter: Payer: Self-pay | Admitting: Gastroenterology

## 2021-03-02 ENCOUNTER — Other Ambulatory Visit: Payer: Self-pay | Admitting: Family

## 2021-03-15 ENCOUNTER — Encounter: Payer: Self-pay | Admitting: Family Medicine

## 2021-03-15 ENCOUNTER — Telehealth (INDEPENDENT_AMBULATORY_CARE_PROVIDER_SITE_OTHER): Payer: Medicare HMO | Admitting: Family Medicine

## 2021-03-15 DIAGNOSIS — M8430XA Stress fracture, unspecified site, initial encounter for fracture: Secondary | ICD-10-CM | POA: Diagnosis not present

## 2021-03-15 NOTE — Progress Notes (Signed)
Virtual Visit via Telephone Note  I connected with Philip Woodard on 03/15/21 at  3:50 PM EST by telephone and verified that I am speaking with the correct person using two identifiers.  Location: Patient: vehicle Provider: office   I discussed the limitations, risks, security and privacy concerns of performing an evaluation and management service by telephone and the availability of in person appointments. I also discussed with the patient that there may be a patient responsible charge related to this service. The patient expressed understanding and agreed to proceed.   History of Present Illness:  Philip Woodard is a 71 year old male that is following up after the MRI of his right foot.  This was demonstrating stress changes of the navicular bone and mild tendinosis of the posterior tibialis without tear.  Observations/Objective:   Assessment and Plan:  Stress changes of the navicular bone of the right foot: The MRI was revealing for stress changes of the navicular is related to the tendinosis of the posterior tibialis. -Counseled on home exercise therapy and supportive care. -Counseled on CAM Walker. -Could consider further physical therapy   Follow Up Instructions:    I discussed the assessment and treatment plan with the patient. The patient was provided an opportunity to ask questions and all were answered. The patient agreed with the plan and demonstrated an understanding of the instructions.   The patient was advised to call back or seek an in-person evaluation if the symptoms worsen or if the condition fails to improve as anticipated.  I provided 5 minutes of non-face-to-face time during this encounter.   Clearance Coots, MD

## 2021-03-15 NOTE — Progress Notes (Signed)
PATIENT: Philip Woodard DOB: Jul 30, 1950  REASON FOR VISIT: follow up HISTORY FROM: patient PRIMARY NEUROLOGIST: Dr. Rexene Alberts  HISTORY OF PRESENT ILLNESS: Today 03/15/21:  [name] is a 71 year old male with a history of obstructive sleep apnea on CPAP.  He returns today for follow-up.  He reports that the CPAP continues to work well for him.  He reports that he does notice the mask leaking at night.  He does change out his supplies regularly.  He returns today for an evaluation.    HISTORY (copied from Dr. Guadelupe Sabin note) Mr. [name] is a 71 year old right-handed gentleman with an underlying medical history of sarcoidosis, hypothyroidism, arthritis, allergic rhinitis, and borderline obesity, who was previously diagnosed with obstructive sleep apnea and placed on CPAP therapy.  Prior sleep study results are not available for my review today.  I reviewed your office note from 01/15/2020.  His Epworth sleepiness score is 10 of 24, fatigue severity score is 11 out of 63. He has benefited from treatment.  He is fully compliant with his AutoPap.  He reports that he had a sleep study originally at [facility] in 2000.  He had a home sleep test some for 5 years ago he recalls.  His current machine works well.  He does use a full facemask, he brought his machine and his mask, he uses a large F 20 fullface mask.  He does notice the leak at times.  He also has mouth dryness.  He generally goes to bed between 11 and midnight and rise time is between 530 and 6.  He denies any recurrent morning headaches or nighttime nocturia.  He is married and lives with his wife.  He is semiretired.  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  They have 2 grown daughters, he has one 63-year-old granddaughter as well.  His older daughter lives in [place], younger daughter in [place].  Younger daughter has sleep apnea, he has a brother with sleep apnea as well.  He is a non-smoker and  drinks alcohol rarely, maybe once a month and no day-to-day caffeine, occasional coffee during the week. I was able to review his CPAP compliance data from 02/15/2020 through 03/15/2020, which is a total of 30 days, during which time he used his machine every night, with percent use days greater than 4 hours at 97%, indicating excellent compliance with an average usage of 6 hours and 6 minutes, residual AHI at goal at 3/h, 95th percentile of pressure at 13.6 cm with a range of 4 to 16 cm with EPR.  He is on AutoPap, he has an air sense 10 auto machine from [employer], set up date according to online records was 10/28/2015.  His air leakage is on the high side fairly consistently with the 95th percentile at 50.8 L/min.  His DME company is [employer].  REVIEW OF SYSTEMS: Out of a complete 14 system review of symptoms, the patient complains only of the following symptoms, and all other reviewed systems are negative.    ALLERGIES: Allergies  Allergen Reactions   Other     SEAFOOD    HOME MEDICATIONS: Outpatient Medications Prior to Visit  Medication Sig Dispense Refill   acetaminophen (TYLENOL) 650 MG CR tablet Take 650 mg by mouth every 8 (eight) hours as needed.     acyclovir (ZOVIRAX) 800 MG tablet TAKE 1 TABLET BY MOUTH DAILY. 90 tablet 0   amLODipine (NORVASC) 10 MG tablet TAKE 1  TABLET BY MOUTH EVERY DAY 90 tablet 0   cholecalciferol (VITAMIN D3) 25 MCG (1000 UT) tablet Take 1,000 Units by mouth daily.     Glucosamine-Chondroit-Vit C-Mn (GLUCOSAMINE 1500 COMPLEX) CAPS Take by mouth.     levothyroxine (SYNTHROID) 175 MCG tablet TAKE 1 TABLET BY MOUTH EVERY DAY 90 tablet 0   Multiple Vitamin (MULTIVITAMIN WITH MINERALS) TABS tablet Take 1 tablet by mouth daily.     Omega-3 Fatty Acids (FISH OIL) 1000 MG CAPS Take 2 capsules by mouth.     predniSONE (DELTASONE) 5 MG tablet Take 6 pills for first day, 5 pills second day, 4 pills third day, 3 pills fourth day, 2 pills the fifth day, and 1 pill sixth  day. (Patient not taking: Reported on 02/24/2021) 21 tablet 0   sildenafil (REVATIO) 20 MG tablet Take 5 tablets as directed 30-45 minutes prior to intercourse (Patient not taking: Reported on 06/30/2020) 50 tablet 0   Turmeric 500 MG TABS Take 1 tablet by mouth 2 (two) times daily.     valsartan (DIOVAN) 80 MG tablet TAKE 1 TABLET BY MOUTH EVERY DAY 30 tablet 0   zinc gluconate 50 MG tablet Take 50 mg by mouth daily.     No facility-administered medications prior to visit.    PAST MEDICAL HISTORY: Past Medical History:  Diagnosis Date   Alkaline phosphatase raised 06/28/2011   Fluctuating - suspect due to sarcoidosis   Allergic rhinitis    Allergy    Arthritis    knees   Genital herpes    Hemorrhoids    Hyperglycemia    Hyperplastic lymph node 06/27/2011   Submental node excised 1/02; regrowth: re-excision 10/06   Hypothyroidism    Multinodular goiter    Routine general medical examination at a health care facility    Sarcoidosis    Sleep apnea    wears cpap     PAST SURGICAL HISTORY: Past Surgical History:  Procedure Laterality Date   COLONOSCOPY  5-23/2003   ROTATOR CUFF REPAIR Bilateral [phone]   THYROIDECTOMY  2002    FAMILY HISTORY: Family History  Problem Relation Age of Onset   Asthma Cousin    Sarcoidosis Neg Hx    Colon polyps Neg Hx    Colon cancer Neg Hx    Esophageal cancer Neg Hx    Stomach cancer Neg Hx    Rectal cancer Neg Hx     SOCIAL HISTORY: Social History   Socioeconomic History   Marital status: Married    Spouse name: Not on file   Number of children: Not on file   Years of education: Not on file   Highest education level: Not on file  Occupational History   Not on file  Tobacco Use   Smoking status: Never   Smokeless tobacco: Never  Substance and Sexual Activity   Alcohol use: Yes    Comment: 3 times per month   Drug use: Never   Sexual activity: Not on file  Other Topics Concern   Not on file  Social History Narrative    Not on file   Social Determinants of Health   Financial Resource Strain: Not on file  Food Insecurity: Not on file  Transportation Needs: Not on file  Physical Activity: Not on file  Stress: Not on file  Social Connections: Not on file  Intimate Partner Violence: Not on file      PHYSICAL EXAM  There were no vitals filed for this visit. There is no height  or weight on file to calculate BMI.  Generalized: Well developed, in no acute distress  Chest: Lungs clear to auscultation bilaterally  Neurological examination  Mentation: Alert oriented to time, place, history taking. Follows all commands speech and language fluent Cranial nerve II-XII: Extraocular movements were full, visual field were full on confrontational test Head turning and shoulder shrug  were normal and symmetric. Motor: The motor testing reveals 5 over 5 strength of all 4 extremities. Good symmetric motor tone is noted throughout.  Sensory: Sensory testing is intact to soft touch on all 4 extremities. No evidence of extinction is noted.  Gait and station: Gait is normal.    DIAGNOSTIC DATA (LABS, IMAGING, TESTING) - I reviewed patient records, labs, notes, testing and imaging myself where available.  Lab Results  Component Value Date   WBC 9.5 06/30/2020   HGB 13.8 06/30/2020   HCT 41.2 06/30/2020   MCV 89.7 06/30/2020   PLT 265.0 06/30/2020      Component Value Date/Time   NA 138 02/24/2021 0959   K 3.9 02/24/2021 0959   CL 103 02/24/2021 0959   CO2 29 02/24/2021 0959   GLUCOSE 84 02/24/2021 0959   BUN 23 02/24/2021 0959   CREATININE 1.48 02/24/2021 0959   CREATININE 1.34 12/10/2013 1014   CALCIUM 9.7 02/24/2021 0959   PROT 7.1 06/30/2020 0942   ALBUMIN 4.0 06/30/2020 0942   AST 30 06/30/2020 0942   ALT 26 06/30/2020 0942   ALKPHOS 66 06/30/2020 0942   BILITOT 0.6 06/30/2020 0942   GFRNONAA 86.70 02/15/2010 0756   GFRAA  02/20/2009 0336    >60        The eGFR has been calculated using the  MDRD equation. This calculation has not been validated in all clinical situations. eGFR's persistently <60 mL/min signify possible Chronic Kidney Disease.   Lab Results  Component Value Date   CHOL 139 02/04/2020   HDL 49.40 02/04/2020   LDLCALC 75 02/04/2020   TRIG 71.0 02/04/2020   CHOLHDL 3 02/04/2020   Lab Results  Component Value Date   HGBA1C 5.7 11/14/2017   No results found for: VITAMINB12 Lab Results  Component Value Date   TSH 2.21 02/04/2020      ASSESSMENT AND PLAN 71 y.o. year old male  has a past medical history of Alkaline phosphatase raised (06/28/2011), Allergic rhinitis, Allergy, Arthritis, Genital herpes, Hemorrhoids, Hyperglycemia, Hyperplastic lymph node (06/27/2011), Hypothyroidism, Multinodular goiter, Routine general medical examination at a health care facility, Sarcoidosis, and Sleep apnea. here with:  OSA on CPAP  - CPAP compliance excellent - Good treatment of AHI  - Encourage patient to use CPAP nightly and > 4 hours each night - F/U in 1 year or sooner if needed   Ward Givens, MSN, NP-C 03/15/2021, 3:59 PM [facility] [address] [place], [place] [address] [phone]

## 2021-03-15 NOTE — Assessment & Plan Note (Signed)
The MRI was revealing for stress changes of the navicular is related to the tendinosis of the posterior tibialis. -Counseled on home exercise therapy and supportive care. -Counseled on CAM Walker. -Could consider further physical therapy

## 2021-03-16 ENCOUNTER — Encounter: Payer: Self-pay | Admitting: Adult Health

## 2021-03-16 ENCOUNTER — Ambulatory Visit: Payer: Medicare HMO | Admitting: Adult Health

## 2021-03-16 VITALS — BP 156/75 | HR 80 | Ht 67.0 in | Wt 207.8 lb

## 2021-03-16 DIAGNOSIS — Z9989 Dependence on other enabling machines and devices: Secondary | ICD-10-CM | POA: Diagnosis not present

## 2021-03-16 DIAGNOSIS — G4733 Obstructive sleep apnea (adult) (pediatric): Secondary | ICD-10-CM

## 2021-03-16 NOTE — Patient Instructions (Signed)
Continue using CPAP nightly and greater than 4 hours each night.  Mask refitting If your symptoms worsen or you develop new symptoms please let us know.

## 2021-03-18 ENCOUNTER — Other Ambulatory Visit: Payer: Self-pay | Admitting: Family

## 2021-03-25 DIAGNOSIS — G4733 Obstructive sleep apnea (adult) (pediatric): Secondary | ICD-10-CM | POA: Diagnosis not present

## 2021-03-31 ENCOUNTER — Ambulatory Visit (INDEPENDENT_AMBULATORY_CARE_PROVIDER_SITE_OTHER): Payer: Medicare HMO | Admitting: Emergency Medicine

## 2021-03-31 ENCOUNTER — Other Ambulatory Visit: Payer: Self-pay

## 2021-03-31 ENCOUNTER — Encounter: Payer: Self-pay | Admitting: Emergency Medicine

## 2021-03-31 ENCOUNTER — Other Ambulatory Visit: Payer: Self-pay | Admitting: Emergency Medicine

## 2021-03-31 ENCOUNTER — Ambulatory Visit: Payer: Medicare HMO | Admitting: Emergency Medicine

## 2021-03-31 VITALS — BP 150/80 | HR 81 | Temp 98.3°F | Ht 67.0 in | Wt 205.0 lb

## 2021-03-31 DIAGNOSIS — N5201 Erectile dysfunction due to arterial insufficiency: Secondary | ICD-10-CM

## 2021-03-31 DIAGNOSIS — Z7689 Persons encountering health services in other specified circumstances: Secondary | ICD-10-CM

## 2021-03-31 DIAGNOSIS — E890 Postprocedural hypothyroidism: Secondary | ICD-10-CM

## 2021-03-31 DIAGNOSIS — Z9989 Dependence on other enabling machines and devices: Secondary | ICD-10-CM

## 2021-03-31 DIAGNOSIS — N1831 Chronic kidney disease, stage 3a: Secondary | ICD-10-CM

## 2021-03-31 DIAGNOSIS — D869 Sarcoidosis, unspecified: Secondary | ICD-10-CM

## 2021-03-31 DIAGNOSIS — G4733 Obstructive sleep apnea (adult) (pediatric): Secondary | ICD-10-CM

## 2021-03-31 DIAGNOSIS — Z23 Encounter for immunization: Secondary | ICD-10-CM | POA: Diagnosis not present

## 2021-03-31 DIAGNOSIS — E039 Hypothyroidism, unspecified: Secondary | ICD-10-CM | POA: Diagnosis not present

## 2021-03-31 DIAGNOSIS — I10 Essential (primary) hypertension: Secondary | ICD-10-CM

## 2021-03-31 MED ORDER — SILDENAFIL CITRATE 20 MG PO TABS: ORAL_TABLET | ORAL | 5 refills | Status: DC

## 2021-03-31 MED ORDER — VALSARTAN 160 MG PO TABS: 160.0000 mg | ORAL_TABLET | Freq: Every day | ORAL | 3 refills | Status: DC

## 2021-03-31 NOTE — Assessment & Plan Note (Signed)
Advised to stay well-hydrated and avoid NSAIDs.

## 2021-03-31 NOTE — Assessment & Plan Note (Signed)
Stable.  No exacerbations.  No pulmonary symptoms.

## 2021-03-31 NOTE — Patient Instructions (Signed)
Increase dose of valsartan to 160 daily.  New prescription sent to pharmacy. Continue amlodipine 10 mg daily. Hypertension, Adult High blood pressure (hypertension) is when the force of blood pumping through the arteries is too strong. The arteries are the blood vessels that carry blood from the heart throughout the body. Hypertension forces the heart to work harder to pump blood and may cause arteries to become narrow or stiff. Untreated or uncontrolled hypertension can cause a heart attack, heart failure, a stroke, kidney disease, and other problems. A blood pressure reading consists of a higher number over a lower number. Ideally, your blood pressure should be below 120/80. The first ("top") number is called the systolic pressure. It is a measure of the pressure in your arteries as your heart beats. The second ("bottom") number is called the diastolic pressure. It is a measure of the pressure in your arteries as the heart relaxes. What are the causes? The exact cause of this condition is not known. There are some conditions that result in or are related to high blood pressure. What increases the risk? Some risk factors for high blood pressure are under your control. The following factors may make you more likely to develop this condition: Smoking. Having type 2 diabetes mellitus, high cholesterol, or both. Not getting enough exercise or physical activity. Being overweight. Having too much fat, sugar, calories, or salt (sodium) in your diet. Drinking too much alcohol. Some risk factors for high blood pressure may be difficult or impossible to change. Some of these factors include: Having chronic kidney disease. Having a family history of high blood pressure. Age. Risk increases with age. Race. You may be at higher risk if you are African American. Gender. Men are at higher risk than women before age 67. After age 4, women are at higher risk than men. Having obstructive sleep  apnea. Stress. What are the signs or symptoms? High blood pressure may not cause symptoms. Very high blood pressure (hypertensive crisis) may cause: Headache. Anxiety. Shortness of breath. Nosebleed. Nausea and vomiting. Vision changes. Severe chest pain. Seizures. How is this diagnosed? This condition is diagnosed by measuring your blood pressure while you are seated, with your arm resting on a flat surface, your legs uncrossed, and your feet flat on the floor. The cuff of the blood pressure monitor will be placed directly against the skin of your upper arm at the level of your heart. It should be measured at least twice using the same arm. Certain conditions can cause a difference in blood pressure between your right and left arms. Certain factors can cause blood pressure readings to be lower or higher than normal for a short period of time: When your blood pressure is higher when you are in a health care provider's office than when you are at home, this is called white coat hypertension. Most people with this condition do not need medicines. When your blood pressure is higher at home than when you are in a health care provider's office, this is called masked hypertension. Most people with this condition may need medicines to control blood pressure. If you have a high blood pressure reading during one visit or you have normal blood pressure with other risk factors, you may be asked to: Return on a different day to have your blood pressure checked again. Monitor your blood pressure at home for 1 week or longer. If you are diagnosed with hypertension, you may have other blood or imaging tests to help your health care  provider understand your overall risk for other conditions. How is this treated? This condition is treated by making healthy lifestyle changes, such as eating healthy foods, exercising more, and reducing your alcohol intake. Your health care provider may prescribe medicine if  lifestyle changes are not enough to get your blood pressure under control, and if: Your systolic blood pressure is above 130. Your diastolic blood pressure is above 80. Your personal target blood pressure may vary depending on your medical conditions, your age, and other factors. Follow these instructions at home: Eating and drinking  Eat a diet that is high in fiber and potassium, and low in sodium, added sugar, and fat. An example eating plan is called the DASH (Dietary Approaches to Stop Hypertension) diet. To eat this way: Eat plenty of fresh fruits and vegetables. Try to fill one half of your plate at each meal with fruits and vegetables. Eat whole grains, such as whole-wheat pasta, brown rice, or whole-grain bread. Fill about one fourth of your plate with whole grains. Eat or drink low-fat dairy products, such as skim milk or low-fat yogurt. Avoid fatty cuts of meat, processed or cured meats, and poultry with skin. Fill about one fourth of your plate with lean proteins, such as fish, chicken without skin, beans, eggs, or tofu. Avoid pre-made and processed foods. These tend to be higher in sodium, added sugar, and fat. Reduce your daily sodium intake. Most people with hypertension should eat less than 1,500 mg of sodium a day. Do not drink alcohol if: Your health care provider tells you not to drink. You are pregnant, may be pregnant, or are planning to become pregnant. If you drink alcohol: Limit how much you use to: 0-1 drink a day for women. 0-2 drinks a day for men. Be aware of how much alcohol is in your drink. In the [place] one drink equals one 12 oz bottle of beer (355 mL), one 5 oz glass of wine (148 mL), or one 1 oz glass of hard liquor (44 mL). Lifestyle  Work with your health care provider to maintain a healthy body weight or to lose weight. Ask what an ideal weight is for you. Get at least 30 minutes of exercise most days of the week. Activities may include walking,  swimming, or biking. Include exercise to strengthen your muscles (resistance exercise), such as Pilates or lifting weights, as part of your weekly exercise routine. Try to do these types of exercises for 30 minutes at least 3 days a week. Do not use any products that contain nicotine or tobacco, such as cigarettes, e-cigarettes, and chewing tobacco. If you need help quitting, ask your health care provider. Monitor your blood pressure at home as told by your health care provider. Keep all follow-up visits as told by your health care provider. This is important. Medicines Take over-the-counter and prescription medicines only as told by your health care provider. Follow directions carefully. Blood pressure medicines must be taken as prescribed. Do not skip doses of blood pressure medicine. Doing this puts you at risk for problems and can make the medicine less effective. Ask your health care provider about side effects or reactions to medicines that you should watch for. Contact a health care provider if you: Think you are having a reaction to a medicine you are taking. Have headaches that keep coming back (recurring). Feel dizzy. Have swelling in your ankles. Have trouble with your vision. Get help right away if you: Develop a severe headache or confusion. Have  unusual weakness or numbness. Feel faint. Have severe pain in your chest or abdomen. Vomit repeatedly. Have trouble breathing. Summary Hypertension is when the force of blood pumping through your arteries is too strong. If this condition is not controlled, it may put you at risk for serious complications. Your personal target blood pressure may vary depending on your medical conditions, your age, and other factors. For most people, a normal blood pressure is less than 120/80. Hypertension is treated with lifestyle changes, medicines, or a combination of both. Lifestyle changes include losing weight, eating a healthy, low-sodium diet,  exercising more, and limiting alcohol. This information is not intended to replace advice given to you by your health care provider. Make sure you discuss any questions you have with your health care provider. Document Revised: 11/07/2017 Document Reviewed: 11/07/2017 Elsevier Patient Education  [place].

## 2021-03-31 NOTE — Assessment & Plan Note (Signed)
Stable.  On CPAP treatment.

## 2021-03-31 NOTE — Progress Notes (Signed)
Philip Woodard 71 y.o.   Chief Complaint  Patient presents with   Transitions Of Care    Discuss stomach discomfort, pt would like shingle vaccine sent to pharmacy    HISTORY OF PRESENT ILLNESS: This is a 71 y.o. male former patient of Marvis Repress, here to establish care with me. Has history of hypertension on valsartan 80 mg and amlodipine 10 mg daily. History of hypothyroidism on Synthroid 175 mcg daily. History of sleep apnea on CPAP therapy. History of fatty liver.  Normal lipid profile.  Not on cholesterol medication. Has no complaints or medical concerns today.  HPI   Prior to Admission medications   Medication Sig Start Date End Date Taking? Authorizing Provider  acetaminophen (TYLENOL) 650 MG CR tablet Take 650 mg by mouth every 8 (eight) hours as needed.   Yes [provider]  acyclovir (ZOVIRAX) 800 MG tablet TAKE 1 TABLET BY MOUTH DAILY. 01/20/21  Yes Marrian Salvage, FNP  amLODipine (NORVASC) 10 MG tablet TAKE 1 TABLET BY MOUTH EVERY DAY 01/25/21  Yes Marrian Salvage, FNP  cholecalciferol (VITAMIN D3) 25 MCG (1000 UT) tablet Take 1,000 Units by mouth daily.   Yes [provider]  Glucosamine-Chondroit-Vit C-Mn (GLUCOSAMINE 1500 COMPLEX) CAPS Take by mouth.   Yes [provider]  levothyroxine (SYNTHROID) 175 MCG tablet TAKE 1 TABLET BY MOUTH EVERY DAY 12/30/20  Yes Marrian Salvage, FNP  Multiple Vitamin (MULTIVITAMIN WITH MINERALS) TABS tablet Take 1 tablet by mouth daily.   Yes [provider]  Omega-3 Fatty Acids (FISH OIL) 1000 MG CAPS Take 2 capsules by mouth.   Yes [provider]  sildenafil (REVATIO) 20 MG tablet Take 5 tablets as directed 30-45 minutes prior to intercourse 03/26/20  Yes Marrian Salvage, FNP  Turmeric 500 MG TABS Take 1 tablet by mouth 2 (two) times daily.   Yes [provider]  valsartan (DIOVAN) 80 MG tablet TAKE 1 TABLET BY MOUTH EVERY DAY 03/02/21  Yes Marrian Salvage, FNP  zinc gluconate 50 MG tablet Take 50 mg by mouth daily.   Yes [provider]  predniSONE (DELTASONE) 5 MG tablet Take 6 pills for first day, 5 pills second day, 4 pills third day, 3 pills fourth day, 2 pills the fifth day, and 1 pill sixth day. 12/13/20   Rosemarie Ax, MD    Allergies  Allergen Reactions   Other     SEAFOOD    Patient Active Problem List   Diagnosis Date Noted   NASH (nonalcoholic steatohepatitis) 02/22/2015   Arthritis of right lower extremity 07/20/2014   Hearing loss 02/19/2014   Obstructive sleep apnea 05/02/2013   Hypothyroid 03/29/2012   HYPERTENSION 11/24/2008   HYPOTHYROIDISM, POSTSURGICAL 07/07/2008   HYPERTROPHY PROSTATE W/UR OBST & OTH LUTS 07/07/2008   [place] DISEASE, LUMBAR 12/27/2007   Sarcoidosis 06/06/2007   GOITER, MULTINODULAR 06/06/2007   HEMORRHOIDS 06/06/2007   ASTHMA 06/06/2007    Past Medical History:  Diagnosis Date   Alkaline phosphatase raised 06/28/2011   Fluctuating - suspect due to sarcoidosis   Allergic rhinitis    Allergy    Arthritis    knees   Genital herpes    Hemorrhoids    Hyperglycemia    Hyperplastic lymph node 06/27/2011   Submental node excised 1/02; regrowth: re-excision 10/06   Hypothyroidism    Multinodular goiter    Routine general medical examination at a health care facility    Sarcoidosis    Sleep apnea  wears cpap     Past Surgical History:  Procedure Laterality Date   COLONOSCOPY  5-23/2003   ROTATOR CUFF REPAIR Bilateral [id number]   THYROIDECTOMY  2002    Social History   Socioeconomic History   Marital status: Married    Spouse name: Not on file   Number of children: Not on file   Years of education: Not on file   Highest education level: Not on file  Occupational History   Not on file  Tobacco Use   Smoking status: Never   Smokeless tobacco: Never  Substance and Sexual Activity   Alcohol use: Yes    Comment: 3 times per month   Drug use: Never    Sexual activity: Not on file  Other Topics Concern   Not on file  Social History Narrative   Not on file   Social Determinants of Health   Financial Resource Strain: Not on file  Food Insecurity: Not on file  Transportation Needs: Not on file  Physical Activity: Not on file  Stress: Not on file  Social Connections: Not on file  Intimate Partner Violence: Not on file    Family History  Problem Relation Age of Onset   Sleep apnea Brother    Sleep apnea Paternal Aunt    Asthma Cousin    Sarcoidosis Neg Hx    Colon polyps Neg Hx    Colon cancer Neg Hx    Esophageal cancer Neg Hx    Stomach cancer Neg Hx    Rectal cancer Neg Hx      Review of Systems  Constitutional: Negative.  Negative for chills and fever.  HENT: Negative.  Negative for congestion and sore throat.   Respiratory: Negative.  Negative for cough and shortness of breath.   Cardiovascular: Negative.  Negative for chest pain and palpitations.  Gastrointestinal: Negative.  Negative for abdominal pain, diarrhea, nausea and vomiting.  Genitourinary: Negative.  Negative for dysuria and hematuria.  Musculoskeletal: Negative.   Skin: Negative.  Negative for rash.  Neurological:  Negative for dizziness and headaches.  All other systems reviewed and are negative.   Physical Exam Vitals reviewed.  Constitutional:      Appearance: Normal appearance.  HENT:     Head: Normocephalic.  Eyes:     Extraocular Movements: Extraocular movements intact.     Conjunctiva/sclera: Conjunctivae normal.     Pupils: Pupils are equal, round, and reactive to light.  Cardiovascular:     Rate and Rhythm: Normal rate and regular rhythm.     Pulses: Normal pulses.     Heart sounds: Normal heart sounds.  Pulmonary:     Effort: Pulmonary effort is normal.     Breath sounds: Normal breath sounds.  Abdominal:     General: There is no distension.     Palpations: Abdomen is soft.     Tenderness: There is no abdominal tenderness.   Musculoskeletal:     Cervical back: No tenderness.  Lymphadenopathy:     Cervical: No cervical adenopathy.  Skin:    Capillary Refill: Capillary refill takes less than 2 seconds.  Neurological:     General: No focal deficit present.     Mental Status: He is alert and oriented to person, place, and time.  Psychiatric:        Mood and Affect: Mood normal.        Behavior: Behavior normal.     ASSESSMENT & PLAN: Problem List Items Addressed This Visit  Cardiovascular and Mediastinum   Essential hypertension    Uncontrolled hypertension.  Continue amlodipine 10 mg daily and increase valsartan dose to 160 mg daily.  Patient has CKD stage IIIa.  Will avoid diuretics at this point. BP Readings from Last 3 Encounters:  03/31/21 (!) 150/80  03/16/21 (!) 156/75  02/24/21 130/76  Dietary approaches to stop hypertension discussed.       Relevant Medications   sildenafil (REVATIO) 20 MG tablet   valsartan (DIOVAN) 160 MG tablet   Erectile dysfunction due to arterial insufficiency   Relevant Medications   sildenafil (REVATIO) 20 MG tablet   valsartan (DIOVAN) 160 MG tablet     Respiratory   OSA on CPAP    Stable.  On CPAP treatment.        Endocrine   HYPOTHYROIDISM, POSTSURGICAL    Clinically euthyroid.  Continue Synthroid 175 mcg daily.      Hypothyroid     Genitourinary   Stage 3a chronic kidney disease ([place])    Advised to stay well-hydrated and avoid NSAIDs.        Other   Sarcoidosis    Stable.  No exacerbations.  No pulmonary symptoms.      Other Visit Diagnoses     Primary hypertension    -  Primary   Relevant Medications   sildenafil (REVATIO) 20 MG tablet   valsartan (DIOVAN) 160 MG tablet   Need for influenza vaccination       Relevant Orders   Flu Vaccine QUAD High Dose(Fluad) (Completed)   Encounter to establish care          Patient Instructions  Increase dose of valsartan to 160 daily.  New prescription sent to pharmacy. Continue  amlodipine 10 mg daily. Hypertension, Adult High blood pressure (hypertension) is when the force of blood pumping through the arteries is too strong. The arteries are the blood vessels that carry blood from the heart throughout the body. Hypertension forces the heart to work harder to pump blood and may cause arteries to become narrow or stiff. Untreated or uncontrolled hypertension can cause a heart attack, heart failure, a stroke, kidney disease, and other problems. A blood pressure reading consists of a higher number over a lower number. Ideally, your blood pressure should be below 120/80. The first ("top") number is called the systolic pressure. It is a measure of the pressure in your arteries as your heart beats. The second ("bottom") number is called the diastolic pressure. It is a measure of the pressure in your arteries as the heart relaxes. What are the causes? The exact cause of this condition is not known. There are some conditions that result in or are related to high blood pressure. What increases the risk? Some risk factors for high blood pressure are under your control. The following factors may make you more likely to develop this condition: Smoking. Having type 2 diabetes mellitus, high cholesterol, or both. Not getting enough exercise or physical activity. Being overweight. Having too much fat, sugar, calories, or salt (sodium) in your diet. Drinking too much alcohol. Some risk factors for high blood pressure may be difficult or impossible to change. Some of these factors include: Having chronic kidney disease. Having a family history of high blood pressure. Age. Risk increases with age. Race. You may be at higher risk if you are African American. Gender. Men are at higher risk than women before age 36. After age 76, women are at higher risk than men. Having obstructive sleep apnea.  Stress. What are the signs or symptoms? High blood pressure may not cause symptoms. Very high  blood pressure (hypertensive crisis) may cause: Headache. Anxiety. Shortness of breath. Nosebleed. Nausea and vomiting. Vision changes. Severe chest pain. Seizures. How is this diagnosed? This condition is diagnosed by measuring your blood pressure while you are seated, with your arm resting on a flat surface, your legs uncrossed, and your feet flat on the floor. The cuff of the blood pressure monitor will be placed directly against the skin of your upper arm at the level of your heart. It should be measured at least twice using the same arm. Certain conditions can cause a difference in blood pressure between your right and left arms. Certain factors can cause blood pressure readings to be lower or higher than normal for a short period of time: When your blood pressure is higher when you are in a health care provider's office than when you are at home, this is called white coat hypertension. Most people with this condition do not need medicines. When your blood pressure is higher at home than when you are in a health care provider's office, this is called masked hypertension. Most people with this condition may need medicines to control blood pressure. If you have a high blood pressure reading during one visit or you have normal blood pressure with other risk factors, you may be asked to: Return on a different day to have your blood pressure checked again. Monitor your blood pressure at home for 1 week or longer. If you are diagnosed with hypertension, you may have other blood or imaging tests to help your health care provider understand your overall risk for other conditions. How is this treated? This condition is treated by making healthy lifestyle changes, such as eating healthy foods, exercising more, and reducing your alcohol intake. Your health care provider may prescribe medicine if lifestyle changes are not enough to get your blood pressure under control, and if: Your systolic blood  pressure is above 130. Your diastolic blood pressure is above 80. Your personal target blood pressure may vary depending on your medical conditions, your age, and other factors. Follow these instructions at home: Eating and drinking  Eat a diet that is high in fiber and potassium, and low in sodium, added sugar, and fat. An example eating plan is called the DASH (Dietary Approaches to Stop Hypertension) diet. To eat this way: Eat plenty of fresh fruits and vegetables. Try to fill one half of your plate at each meal with fruits and vegetables. Eat whole grains, such as whole-wheat pasta, brown rice, or whole-grain bread. Fill about one fourth of your plate with whole grains. Eat or drink low-fat dairy products, such as skim milk or low-fat yogurt. Avoid fatty cuts of meat, processed or cured meats, and poultry with skin. Fill about one fourth of your plate with lean proteins, such as fish, chicken without skin, beans, eggs, or tofu. Avoid pre-made and processed foods. These tend to be higher in sodium, added sugar, and fat. Reduce your daily sodium intake. Most people with hypertension should eat less than 1,500 mg of sodium a day. Do not drink alcohol if: Your health care provider tells you not to drink. You are pregnant, may be pregnant, or are planning to become pregnant. If you drink alcohol: Limit how much you use to: 0-1 drink a day for women. 0-2 drinks a day for men. Be aware of how much alcohol is in your drink. In the [place]  one drink equals one 12 oz bottle of beer (355 mL), one 5 oz glass of wine (148 mL), or one 1 oz glass of hard liquor (44 mL). Lifestyle  Work with your health care provider to maintain a healthy body weight or to lose weight. Ask what an ideal weight is for you. Get at least 30 minutes of exercise most days of the week. Activities may include walking, swimming, or biking. Include exercise to strengthen your muscles (resistance exercise), such as Pilates or  lifting weights, as part of your weekly exercise routine. Try to do these types of exercises for 30 minutes at least 3 days a week. Do not use any products that contain nicotine or tobacco, such as cigarettes, e-cigarettes, and chewing tobacco. If you need help quitting, ask your health care provider. Monitor your blood pressure at home as told by your health care provider. Keep all follow-up visits as told by your health care provider. This is important. Medicines Take over-the-counter and prescription medicines only as told by your health care provider. Follow directions carefully. Blood pressure medicines must be taken as prescribed. Do not skip doses of blood pressure medicine. Doing this puts you at risk for problems and can make the medicine less effective. Ask your health care provider about side effects or reactions to medicines that you should watch for. Contact a health care provider if you: Think you are having a reaction to a medicine you are taking. Have headaches that keep coming back (recurring). Feel dizzy. Have swelling in your ankles. Have trouble with your vision. Get help right away if you: Develop a severe headache or confusion. Have unusual weakness or numbness. Feel faint. Have severe pain in your chest or abdomen. Vomit repeatedly. Have trouble breathing. Summary Hypertension is when the force of blood pumping through your arteries is too strong. If this condition is not controlled, it may put you at risk for serious complications. Your personal target blood pressure may vary depending on your medical conditions, your age, and other factors. For most people, a normal blood pressure is less than 120/80. Hypertension is treated with lifestyle changes, medicines, or a combination of both. Lifestyle changes include losing weight, eating a healthy, low-sodium diet, exercising more, and limiting alcohol. This information is not intended to replace advice given to you by  your health care provider. Make sure you discuss any questions you have with your health care provider. Document Revised: 11/07/2017 Document Reviewed: 11/07/2017 Elsevier Patient Education  2022 [place], MD [facility]

## 2021-03-31 NOTE — Assessment & Plan Note (Signed)
Clinically euthyroid.  Continue Synthroid 175 mcg daily.

## 2021-03-31 NOTE — Assessment & Plan Note (Signed)
Uncontrolled hypertension.  Continue amlodipine 10 mg daily and increase valsartan dose to 160 mg daily.  Patient has CKD stage IIIa.  Will avoid diuretics at this point. BP Readings from Last 3 Encounters:  03/31/21 (!) 150/80  03/16/21 (!) 156/75  02/24/21 130/76  Dietary approaches to stop hypertension discussed.

## 2021-04-02 ENCOUNTER — Other Ambulatory Visit: Payer: Self-pay | Admitting: Family

## 2021-04-04 ENCOUNTER — Ambulatory Visit (INDEPENDENT_AMBULATORY_CARE_PROVIDER_SITE_OTHER): Payer: Medicare HMO

## 2021-04-04 ENCOUNTER — Other Ambulatory Visit: Payer: Self-pay

## 2021-04-04 DIAGNOSIS — Z0000 Encounter for general adult medical examination without abnormal findings: Secondary | ICD-10-CM | POA: Diagnosis not present

## 2021-04-04 DIAGNOSIS — Z1211 Encounter for screening for malignant neoplasm of colon: Secondary | ICD-10-CM

## 2021-04-04 NOTE — Patient Instructions (Signed)
Philip Woodard , Thank you for taking time to come for your Medicare Wellness Visit. I appreciate your ongoing commitment to your health goals. Please review the following plan we discussed and let me know if I can assist you in the future.   Screening recommendations/referrals: Colonoscopy: referral 04/04/2021 Recommended yearly ophthalmology/optometry visit for glaucoma screening and checkup Recommended yearly dental visit for hygiene and checkup  Vaccinations: Influenza vaccine: completed  Pneumococcal vaccine: completed  Tdap vaccine: 02/22/2016 Shingles vaccine: will consider     Advanced directives: yes   Conditions/risks identified: none   Next appointment: none   Preventive Care 30 Years and Older, Male Preventive care refers to lifestyle choices and visits with your health care provider that can promote health and wellness. What does preventive care include? A yearly physical exam. This is also called an annual well check. Dental exams once or twice a year. Routine eye exams. Ask your health care provider how often you should have your eyes checked. Personal lifestyle choices, including: Daily care of your teeth and gums. Regular physical activity. Eating a healthy diet. Avoiding tobacco and drug use. Limiting alcohol use. Practicing safe sex. Taking low doses of aspirin every day. Taking vitamin and mineral supplements as recommended by your health care provider. What happens during an annual well check? The services and screenings done by your health care provider during your annual well check will depend on your age, overall health, lifestyle risk factors, and family history of disease. Counseling  Your health care provider may ask you questions about your: Alcohol use. Tobacco use. Drug use. Emotional well-being. Home and relationship well-being. Sexual activity. Eating habits. History of falls. Memory and ability to understand (cognition). Work and work  Statistician. Screening  You may have the following tests or measurements: Height, weight, and BMI. Blood pressure. Lipid and cholesterol levels. These may be checked every 5 years, or more frequently if you are over 8 years old. Skin check. Lung cancer screening. You may have this screening every year starting at age 81 if you have a 30-pack-year history of smoking and currently smoke or have quit within the past 15 years. Fecal occult blood test (FOBT) of the stool. You may have this test every year starting at age 23. Flexible sigmoidoscopy or colonoscopy. You may have a sigmoidoscopy every 5 years or a colonoscopy every 10 years starting at age 49. Prostate cancer screening. Recommendations will vary depending on your family history and other risks. Hepatitis C blood test. Hepatitis B blood test. Sexually transmitted disease (STD) testing. Diabetes screening. This is done by checking your blood sugar (glucose) after you have not eaten for a while (fasting). You may have this done every 1-3 years. Abdominal aortic aneurysm (AAA) screening. You may need this if you are a current or former smoker. Osteoporosis. You may be screened starting at age 75 if you are at high risk. Talk with your health care provider about your test results, treatment options, and if necessary, the need for more tests. Vaccines  Your health care provider may recommend certain vaccines, such as: Influenza vaccine. This is recommended every year. Tetanus, diphtheria, and acellular pertussis (Tdap, Td) vaccine. You may need a Td booster every 10 years. Zoster vaccine. You may need this after age 58. Pneumococcal 13-valent conjugate (PCV13) vaccine. One dose is recommended after age 66. Pneumococcal polysaccharide (PPSV23) vaccine. One dose is recommended after age 23. Talk to your health care provider about which screenings and vaccines you need and how often  you need them. This information is not intended to replace  advice given to you by your health care provider. Make sure you discuss any questions you have with your health care provider. Document Released: 03/26/2015 Document Revised: 11/17/2015 Document Reviewed: 12/29/2014 Elsevier Interactive Patient Education  2017 [employer] Prevention in the Home Falls can cause injuries. They can happen to people of all ages. There are many things you can do to make your home safe and to help prevent falls. What can I do on the outside of my home? Regularly fix the edges of walkways and driveways and fix any cracks. Remove anything that might make you trip as you walk through a door, such as a raised step or threshold. Trim any bushes or trees on the path to your home. Use bright outdoor lighting. Clear any walking paths of anything that might make someone trip, such as rocks or tools. Regularly check to see if handrails are loose or broken. Make sure that both sides of any steps have handrails. Any raised decks and porches should have guardrails on the edges. Have any leaves, snow, or ice cleared regularly. Use sand or salt on walking paths during winter. Clean up any spills in your garage right away. This includes oil or grease spills. What can I do in the bathroom? Use night lights. Install grab bars by the toilet and in the tub and shower. Do not use towel bars as grab bars. Use non-skid mats or decals in the tub or shower. If you need to sit down in the shower, use a plastic, non-slip stool. Keep the floor dry. Clean up any water that spills on the floor as soon as it happens. Remove soap buildup in the tub or shower regularly. Attach bath mats securely with double-sided non-slip rug tape. Do not have throw rugs and other things on the floor that can make you trip. What can I do in the bedroom? Use night lights. Make sure that you have a light by your bed that is easy to reach. Do not use any sheets or blankets that are too big for your bed.  They should not hang down onto the floor. Have a firm chair that has side arms. You can use this for support while you get dressed. Do not have throw rugs and other things on the floor that can make you trip. What can I do in the kitchen? Clean up any spills right away. Avoid walking on wet floors. Keep items that you use a lot in easy-to-reach places. If you need to reach something above you, use a strong step stool that has a grab bar. Keep electrical cords out of the way. Do not use floor polish or wax that makes floors slippery. If you must use wax, use non-skid floor wax. Do not have throw rugs and other things on the floor that can make you trip. What can I do with my stairs? Do not leave any items on the stairs. Make sure that there are handrails on both sides of the stairs and use them. Fix handrails that are broken or loose. Make sure that handrails are as long as the stairways. Check any carpeting to make sure that it is firmly attached to the stairs. Fix any carpet that is loose or worn. Avoid having throw rugs at the top or bottom of the stairs. If you do have throw rugs, attach them to the floor with carpet tape. Make sure that you have a light  switch at the top of the stairs and the bottom of the stairs. If you do not have them, ask someone to add them for you. What else can I do to help prevent falls? Wear shoes that: Do not have high heels. Have rubber bottoms. Are comfortable and fit you well. Are closed at the toe. Do not wear sandals. If you use a stepladder: Make sure that it is fully opened. Do not climb a closed stepladder. Make sure that both sides of the stepladder are locked into place. Ask someone to hold it for you, if possible. Clearly mark and make sure that you can see: Any grab bars or handrails. First and last steps. Where the edge of each step is. Use tools that help you move around (mobility aids) if they are needed. These  include: Canes. Walkers. Scooters. Crutches. Turn on the lights when you go into a dark area. Replace any light bulbs as soon as they burn out. Set up your furniture so you have a clear path. Avoid moving your furniture around. If any of your floors are uneven, fix them. If there are any pets around you, be aware of where they are. Review your medicines with your doctor. Some medicines can make you feel dizzy. This can increase your chance of falling. Ask your doctor what other things that you can do to help prevent falls. This information is not intended to replace advice given to you by your health care provider. Make sure you discuss any questions you have with your health care provider. Document Released: 12/24/2008 Document Revised: 08/05/2015 Document Reviewed: 04/03/2014 Elsevier Interactive Patient Education  2017 [employer].

## 2021-04-04 NOTE — Progress Notes (Signed)
Subjective:   Philip Woodard is a 71 y.o. male who presents for an Subsequent Medicare Annual Wellness Visit.  I connected with Kanai Berrios today by telephone and verified that I am speaking with the correct person using two identifiers. Location patient: home Location provider: work Persons participating in the virtual visit: patient, provider.   I discussed the limitations, risks, security and privacy concerns of performing an evaluation and management service by telephone and the availability of in person appointments. I also discussed with the patient that there may be a patient responsible charge related to this service. The patient expressed understanding and verbally consented to this telephonic visit.    Interactive audio and video telecommunications were attempted between this provider and patient, however failed, due to patient having technical difficulties OR patient did not have access to video capability.  We continued and completed visit with audio only.    Review of Systems     Cardiac Risk Factors include: advanced age (>56mn, >>67women);male gender;dyslipidemia     Objective:    Today's Vitals   There is no height or weight on file to calculate BMI.  Advanced Directives 04/04/2021 01/16/2020 12/12/2018 12/24/2017 03/04/2015 02/22/2015  Does Patient Have a Medical Advance Directive? No Yes No No No No  Type of Advance Directive - Healthcare Power of [place] - - - -  Does patient want to make changes to medical advance directive? - No - Patient declined - - - -  Copy of [place]? - No - copy requested - - - -  Would patient like information on creating a medical advance directive? No - Patient declined - - - - -    Current Medications (verified) Outpatient Encounter Medications as of 04/04/2021  Medication Sig   acetaminophen (TYLENOL) 650 MG CR tablet Take 650 mg by mouth every 8 (eight) hours as needed.   acyclovir  (ZOVIRAX) 800 MG tablet TAKE 1 TABLET BY MOUTH DAILY.   amLODipine (NORVASC) 10 MG tablet TAKE 1 TABLET BY MOUTH EVERY DAY   cholecalciferol (VITAMIN D3) 25 MCG (1000 UT) tablet Take 1,000 Units by mouth daily.   Glucosamine-Chondroit-Vit C-Mn (GLUCOSAMINE 1500 COMPLEX) CAPS Take by mouth.   levothyroxine (SYNTHROID) 175 MCG tablet TAKE 1 TABLET BY MOUTH EVERY DAY   Multiple Vitamin (MULTIVITAMIN WITH MINERALS) TABS tablet Take 1 tablet by mouth daily.   Omega-3 Fatty Acids (FISH OIL) 1000 MG CAPS Take 2 capsules by mouth.   sildenafil (REVATIO) 20 MG tablet TAKE 5 TABLETS AS DIRECTED 30-45 MINUTES PRIOR TO INTERCOURSE   Turmeric 500 MG TABS Take 1 tablet by mouth 2 (two) times daily.   valsartan (DIOVAN) 160 MG tablet Take 1 tablet (160 mg total) by mouth daily.   zinc gluconate 50 MG tablet Take 50 mg by mouth daily.   No facility-administered encounter medications on file as of 04/04/2021.    Allergies (verified) Other   History: Past Medical History:  Diagnosis Date   Alkaline phosphatase raised 06/28/2011   Fluctuating - suspect due to sarcoidosis   Allergic rhinitis    Allergy    Arthritis    knees   Genital herpes    Hemorrhoids    Hyperglycemia    Hyperplastic lymph node 06/27/2011   Submental node excised 1/02; regrowth: re-excision 10/06   Hypothyroidism    Multinodular goiter    Routine general medical examination at a health care facility    Sarcoidosis    Sleep apnea  wears cpap    Past Surgical History:  Procedure Laterality Date   COLONOSCOPY  5-23/2003   ROTATOR CUFF REPAIR Bilateral [id number]   THYROIDECTOMY  2002   Family History  Problem Relation Age of Onset   Sleep apnea Brother    Sleep apnea Paternal Aunt    Asthma Cousin    Sarcoidosis Neg Hx    Colon polyps Neg Hx    Colon cancer Neg Hx    Esophageal cancer Neg Hx    Stomach cancer Neg Hx    Rectal cancer Neg Hx    Social History   Socioeconomic History   Marital status: Married     Spouse name: Not on file   Number of children: Not on file   Years of education: Not on file   Highest education level: Not on file  Occupational History   Not on file  Tobacco Use   Smoking status: Never   Smokeless tobacco: Never  Substance and Sexual Activity   Alcohol use: Yes    Comment: 3 times per month   Drug use: Never   Sexual activity: Not on file  Other Topics Concern   Not on file  Social History Narrative   Not on file   Social Determinants of Health   Financial Resource Strain: Low Risk    Difficulty of Paying Living Expenses: Not hard at all  Food Insecurity: No Food Insecurity   Worried About Charity fundraiser in the Last Year: Never true   Ran Out of Food in the Last Year: Never true  Transportation Needs: No Transportation Needs   Lack of Transportation (Medical): No   Lack of Transportation (Non-Medical): No  Physical Activity: Sufficiently Active   Days of Exercise per Week: 3 days   Minutes of Exercise per Session: 60 min  Stress: No Stress Concern Present   Feeling of Stress : Not at all  Social Connections: Moderately Integrated   Frequency of Communication with Friends and Family: Twice a week   Frequency of Social Gatherings with Friends and Family: Twice a week   Attends Religious Services: More than 4 times per year   Active Member of [employer] or Organizations: No   Attends Music therapist: Never   Marital Status: Married    Tobacco Counseling Counseling given: Not Answered   Clinical Intake:  Pre-visit preparation completed: Yes  Pain : No/denies pain     Nutritional Risks: None Diabetes: No  How often do you need to have someone help you when you read instructions, pamphlets, or other written materials from your doctor or pharmacy?: 1 - Never What is the last grade level you completed in school?: BS  Diabetic?no  Interpreter Needed?: No  Information entered by :: [place] of Daily Living In  your present state of health, do you have any difficulty performing the following activities: 04/04/2021  Hearing? N  Vision? N  Difficulty concentrating or making decisions? N  Walking or climbing stairs? N  Dressing or bathing? N  Doing errands, shopping? N  Preparing Food and eating ? N  Using the Toilet? N  In the past six months, have you accidently leaked urine? N  Do you have problems with loss of bowel control? N  Managing your Medications? N  Managing your Finances? N  Housekeeping or managing your Housekeeping? N  Some recent data might be hidden    Patient Care Team: Horald Pollen, MD as PCP - General (Internal  Medicine) Tanda Rockers, MD (Pulmonary Disease) Kimbrough, Achille Rich., MD as Attending Physician (Urology)  Indicate any recent Medical Services you may have received from other than [facility] providers in the past year (date may be approximate).     Assessment:   This is a routine wellness examination for [place].  Hearing/Vision screen Vision Screening - Comments:: Annual eye exams wears glasses   Dietary issues and exercise activities discussed: Current Exercise Habits: Home exercise routine, Type of exercise: walking, Time (Minutes): 60, Frequency (Times/Week): 3, Weekly Exercise (Minutes/Week): 180, Intensity: Mild, Exercise limited by: None identified   Goals Addressed             This Visit's Progress    Patient Stated   On track    My goal is to reduce my weight by 10 pounds and continue to be independent and physically active.       Depression Screen PHQ 2/9 Scores 04/04/2021 04/04/2021 03/31/2021 06/30/2020 01/16/2020 08/06/2019 01/04/2018  PHQ - 2 Score 0 0 0 0 0 0 0    Fall Risk Fall Risk  04/04/2021 03/31/2021 06/30/2020 01/16/2020 08/06/2019  Falls in the past year? 0 0 0 0 0  Number falls in past yr: 0 0 0 0 0  Injury with Fall? 0 0 0 0 0  Risk for fall due to : - - - No Fall Risks No Fall Risks  Follow up Falls evaluation completed  - Falls evaluation completed Falls evaluation completed Falls evaluation completed    [place]:  Any stairs in or around the home? Yes  If so, are there any without handrails? No  Home free of loose throw rugs in walkways, pet beds, electrical cords, etc? Yes  Adequate lighting in your home to reduce risk of falls? Yes   ASSISTIVE DEVICES UTILIZED TO PREVENT FALLS:  Life alert? No  Use of a cane, walker or w/c? No  Grab bars in the bathroom? No  Shower chair or bench in shower? No  Elevated toilet seat or a handicapped toilet? No    Cognitive Function:  Normal cognitive status assessed by direct observation by this Nurse Health Advisor. No abnormalities found.        Immunizations Immunization History  Administered Date(s) Administered   Fluad Quad(high Dose 65+) 12/11/2018, 01/20/2020, 03/31/2021   Influenza Whole 12/11/2009, 12/11/2011   Influenza, High Dose Seasonal PF 11/14/2017   Influenza,inj,Quad PF,6+ Mos 01/31/2013, 12/10/2013, 02/22/2016, 12/19/2016   Moderna Sars-Covid-2 Vaccination 03/18/2020   PFIZER(Purple Top)SARS-COV-2 Vaccination 04/04/2019, 04/25/2019   Pneumococcal Conjugate-13 11/14/2017   Pneumococcal Polysaccharide-23 01/31/2013   Td 10/23/2005   Tdap 02/22/2016   Zoster, Live 01/31/2013    TDAP status: Up to date  Flu Vaccine status: Up to date  Pneumococcal vaccine status: Up to date  Covid-19 vaccine status: Completed vaccines  Qualifies for Shingles Vaccine? Yes   Zostavax completed No   Shingrix Completed?: No.    Education has been provided regarding the importance of this vaccine. Patient has been advised to call insurance company to determine out of pocket expense if they have not yet received this vaccine. Advised may also receive vaccine at local pharmacy or Health Dept. Verbalized acceptance and understanding.  Screening Tests Health Maintenance  Topic Date Due   Zoster Vaccines- Shingrix (1 of  2) Never done   Pneumonia Vaccine 91+ Years old (59 - PPSV23 if available, else PCV20) 11/15/2018   COLONOSCOPY (Pts 45-24yr Insurance coverage will need to be  confirmed)  03/31/2022 (Originally 02/04/2021)   TETANUS/TDAP  02/21/2026   INFLUENZA VACCINE  Completed   Hepatitis C Screening  Completed   HPV VACCINES  Aged Out   COVID-19 Vaccine  Discontinued    Health Maintenance  Health Maintenance Due  Topic Date Due   Zoster Vaccines- Shingrix (1 of 2) Never done   Pneumonia Vaccine 57+ Years old (49 - PPSV23 if available, else PCV20) 11/15/2018    Colorectal cancer screening: Referral to GI placed 04/04/2021. Pt aware the office will call re: appt.  Lung Cancer Screening: (Low Dose CT Chest recommended if Age 32-80 years, 30 pack-year currently smoking OR have quit w/in 15years.) does not qualify.   Lung Cancer Screening Referral: n/a  Additional Screening:  Hepatitis C Screening: does not qualify; Completed 09/13/2011  Vision Screening: Recommended annual ophthalmology exams for early detection of glaucoma and other disorders of the eye. Is the patient up to date with their annual eye exam?  Yes  Who is the provider or what is the name of the office in which the patient attends annual eye exams? [facility]  If pt is not established with a provider, would they like to be referred to a provider to establish care? No .   Dental Screening: Recommended annual dental exams for proper oral hygiene  Community Resource Referral / Chronic Care Management: CRR required this visit?  No   CCM required this visit?  No      Plan:     I have personally reviewed and noted the following in the patients chart:   Medical and social history Use of alcohol, tobacco or illicit drugs  Current medications and supplements including opioid prescriptions. Patient is not currently taking opioid prescriptions. Functional ability and status Nutritional status Physical activity Advanced  directives List of other physicians Hospitalizations, surgeries, and ER visits in previous 12 months Vitals Screenings to include cognitive, depression, and falls Referrals and appointments  In addition, I have reviewed and discussed with patient certain preventive protocols, quality metrics, and best practice recommendations. A written personalized care plan for preventive services as well as general preventive health recommendations were provided to patient.     Randel Pigg, LPN   6/81/1572   Nurse Notes: none

## 2021-04-05 ENCOUNTER — Encounter: Payer: Self-pay | Admitting: Gastroenterology

## 2021-04-05 ENCOUNTER — Telehealth: Payer: Self-pay | Admitting: Emergency Medicine

## 2021-04-05 NOTE — Telephone Encounter (Signed)
1.Medication Requested: Levothyroxine   2. Pharmacy (Name, [facility]): [facility], [place]   3. On Med List: yes  4. Last Visit with PCP: 01.19.2023  5. Next visit date with PCP: 07.25.23   Agent: Please be advised that RX refills may take up to 3 business days. We ask that you follow-up with your pharmacy.

## 2021-04-07 ENCOUNTER — Other Ambulatory Visit: Payer: Self-pay | Admitting: Emergency Medicine

## 2021-04-08 NOTE — Telephone Encounter (Signed)
Medication refilled

## 2021-04-25 ENCOUNTER — Other Ambulatory Visit: Payer: Self-pay | Admitting: Family

## 2021-04-25 DIAGNOSIS — G4733 Obstructive sleep apnea (adult) (pediatric): Secondary | ICD-10-CM | POA: Diagnosis not present

## 2021-04-27 ENCOUNTER — Ambulatory Visit (HOSPITAL_BASED_OUTPATIENT_CLINIC_OR_DEPARTMENT_OTHER)
Admission: RE | Admit: 2021-04-27 | Discharge: 2021-04-27 | Disposition: A | Discharge status: Home / Self Care | Payer: Medicare HMO | Source: Ambulatory Visit | Attending: Family Medicine | Admitting: Family Medicine

## 2021-04-27 ENCOUNTER — Ambulatory Visit: Payer: Medicare HMO | Admitting: Family Medicine

## 2021-04-27 ENCOUNTER — Encounter: Payer: Self-pay | Admitting: Family Medicine

## 2021-04-27 ENCOUNTER — Other Ambulatory Visit: Payer: Self-pay

## 2021-04-27 VITALS — BP 136/80 | Ht 67.0 in | Wt 205.0 lb

## 2021-04-27 DIAGNOSIS — M542 Cervicalgia: Secondary | ICD-10-CM | POA: Diagnosis not present

## 2021-04-27 DIAGNOSIS — D869 Sarcoidosis, unspecified: Secondary | ICD-10-CM

## 2021-04-27 DIAGNOSIS — S161XXA Strain of muscle, fascia and tendon at neck level, initial encounter: Secondary | ICD-10-CM | POA: Insufficient documentation

## 2021-04-27 DIAGNOSIS — M1A0720 Idiopathic chronic gout, left ankle and foot, without tophus (tophi): Secondary | ICD-10-CM | POA: Diagnosis not present

## 2021-04-27 DIAGNOSIS — M2578 Osteophyte, vertebrae: Secondary | ICD-10-CM | POA: Diagnosis not present

## 2021-04-27 NOTE — Assessment & Plan Note (Signed)
Has a history of sarcoidosis.  Historically has been pulmonary and skin related.  Unclear if is related to his current exacerbation of neck and shoulder pain. -ANA, sed rate, CRP

## 2021-04-27 NOTE — Progress Notes (Signed)
°  Philip Woodard - 71 y.o. male MRN [id number]  Date of birth: [birth year]  SUBJECTIVE:  Including CC & ROS.  No chief complaint on file.   Philip Woodard is a 71 y.o. male that is presenting with acute bilateral neck pain and shoulder pain.  The pain has been ongoing since Saturday.  No injury or inciting event.  Having stiffness and limited range of motion.  No history of similar occurrence.   Review of Systems See HPI   HISTORY: Past Medical, Surgical, Social, and Family History Reviewed & Updated per EMR.   Pertinent Historical Findings include:  Past Medical History:  Diagnosis Date   Alkaline phosphatase raised 06/28/2011   Fluctuating - suspect due to sarcoidosis   Allergic rhinitis    Allergy    Arthritis    knees   Genital herpes    Hemorrhoids    Hyperglycemia    Hyperplastic lymph node 06/27/2011   Submental node excised 1/02; regrowth: re-excision 10/06   Hypothyroidism    Multinodular goiter    Routine general medical examination at a health care facility    Sarcoidosis    Sleep apnea    wears cpap     Past Surgical History:  Procedure Laterality Date   COLONOSCOPY  5-23/2003   ROTATOR CUFF REPAIR Bilateral 2005,2007   THYROIDECTOMY  2002     PHYSICAL EXAM:  VS: BP 136/80 (BP Location: Left Arm, Patient Position: Sitting)    Ht 5' 7"  (1.702 m)    Wt 205 lb (93 kg)    BMI 32.11 kg/m  Physical Exam Gen: NAD, alert, cooperative with exam, well-appearing MSK:  Neurovascularly intact       ASSESSMENT & PLAN:   Cervical strain Acutely occurring.  Unclear if this is related to an underlying inflammatory disorder or degenerative changes. -Counseled on home exercise therapy and supportive care. -X-ray. -Could consider course of prednisone  Chronic idiopathic gout involving toe of left foot without tophus History of gout.  Unclear if her current exacerbation is related. -Check uric acid.  Sarcoidosis Has a history of sarcoidosis.  Historically has  been pulmonary and skin related.  Unclear if is related to his current exacerbation of neck and shoulder pain. -ANA, sed rate, CRP

## 2021-04-27 NOTE — Patient Instructions (Signed)
Good to see you I will call with the results from today  Please use heat   Please send me a message in MyChart with any questions or updates.  Follow up will depend on results.   --Dr. Raeford Razor

## 2021-04-27 NOTE — Assessment & Plan Note (Signed)
Acutely occurring.  Unclear if this is related to an underlying inflammatory disorder or degenerative changes. -Counseled on home exercise therapy and supportive care. -X-ray. -Could consider course of prednisone

## 2021-04-27 NOTE — Assessment & Plan Note (Signed)
History of gout.  Unclear if her current exacerbation is related. -Check uric acid.

## 2021-04-28 ENCOUNTER — Other Ambulatory Visit: Payer: Self-pay | Admitting: Family

## 2021-04-28 ENCOUNTER — Telehealth: Payer: Self-pay | Admitting: Family Medicine

## 2021-04-28 MED ORDER — PREDNISONE 5 MG PO TABS: ORAL_TABLET | ORAL | 0 refills | Status: DC

## 2021-04-28 NOTE — Telephone Encounter (Signed)
Left VM for patient. If he calls back please have him speak with a nurse/CMA and inform that his CRP is elevated.  This is an acute inflammatory nonspecific lab.  We are awaiting his other lab work but the uric acid continues to improve.  I will send in prednisone for now..   If any questions then please take the best time and phone number to call and I will try to call him back.   Rosemarie Ax, MD [facility] 04/28/2021, 5:05 PM

## 2021-04-29 ENCOUNTER — Encounter: Payer: Self-pay | Admitting: Emergency Medicine

## 2021-04-29 NOTE — Telephone Encounter (Signed)
Pt informed of below.  

## 2021-05-02 LAB — ANA,IFA RA DIAG PNL W/RFLX TIT/PATN
ANA Titer 1: NEGATIVE
Cyclic Citrullin Peptide Ab: 2 U (ref 0–19)
Rheumatoid fact SerPl-aCnc: 11.8 [IU]/mL

## 2021-05-02 LAB — SEDIMENTATION RATE: Sed Rate: 17 mm/hr (ref 0–30)

## 2021-05-02 LAB — C-REACTIVE PROTEIN: CRP: 60 mg/L — ABNORMAL HIGH (ref 0–10)

## 2021-05-02 LAB — URIC ACID: Uric Acid: 3.9 mg/dL (ref 3.8–8.4)

## 2021-05-11 ENCOUNTER — Encounter: Payer: Self-pay | Admitting: Family Medicine

## 2021-05-12 ENCOUNTER — Other Ambulatory Visit: Payer: Self-pay

## 2021-05-12 ENCOUNTER — Ambulatory Visit (AMBULATORY_SURGERY_CENTER): Payer: Medicare HMO | Admitting: *Deleted

## 2021-05-12 ENCOUNTER — Other Ambulatory Visit: Payer: Self-pay | Admitting: Family Medicine

## 2021-05-12 VITALS — Ht 67.0 in | Wt 199.0 lb

## 2021-05-12 DIAGNOSIS — M1A0720 Idiopathic chronic gout, left ankle and foot, without tophus (tophi): Secondary | ICD-10-CM

## 2021-05-12 DIAGNOSIS — D869 Sarcoidosis, unspecified: Secondary | ICD-10-CM

## 2021-05-12 DIAGNOSIS — Z86010 Personal history of colonic polyps: Secondary | ICD-10-CM

## 2021-05-12 MED ORDER — NA SULFATE-K SULFATE-MG SULF 17.5-3.13-1.6 GM/177ML PO SOLN: 1.0000 | Freq: Once | ORAL | 0 refills | Status: AC

## 2021-05-12 NOTE — Progress Notes (Signed)
No egg or soy allergy known to patient  ?No issues known to pt with past sedation with any surgeries or procedures ?Patient denies ever being told they had issues or difficulty with intubation  ?No FH of Malignant Hyperthermia ?Pt is not on diet pills ?Pt is not on  home 02  ?Pt is not on blood thinners  ?Pt denies issues with constipation  ?No A fib or A flutter ? ?Pt is  vaccinated  for Covid  ? ?Due to the COVID-19 pandemic we are asking patients to follow certain guidelines in PV and the [place]   ?Pt aware of COVID protocols and [facility] guidelines  ? ?PV completed over the phone. Pt verified name, DOB, address and insurance during PV today.  ?Pt mailed instruction packet with copy of consent form to read and not return, and instructions.  ?Pt encouraged to call with questions or issues.  ?If pt has My chart, procedure instructions sent via My Chart   ?

## 2021-05-12 NOTE — Progress Notes (Signed)
Referral to rheumatology for ongoing joint stiffness and pain with sarcoidosis and gout.  ? ?Rosemarie Ax, MD ?[facility] ?05/12/2021, 8:51 AM ? ?

## 2021-05-23 ENCOUNTER — Encounter: Payer: Self-pay | Admitting: Gastroenterology

## 2021-05-26 ENCOUNTER — Ambulatory Visit (AMBULATORY_SURGERY_CENTER): Payer: Medicare HMO | Admitting: Gastroenterology

## 2021-05-26 ENCOUNTER — Encounter: Payer: Self-pay | Admitting: Gastroenterology

## 2021-05-26 ENCOUNTER — Other Ambulatory Visit: Payer: Self-pay | Admitting: Gastroenterology

## 2021-05-26 VITALS — BP 110/64 | HR 83 | Temp 98.9°F | Resp 10 | Ht 67.0 in | Wt 199.0 lb

## 2021-05-26 DIAGNOSIS — Z86010 Personal history of colonic polyps: Secondary | ICD-10-CM | POA: Diagnosis not present

## 2021-05-26 DIAGNOSIS — D125 Benign neoplasm of sigmoid colon: Secondary | ICD-10-CM | POA: Diagnosis not present

## 2021-05-26 DIAGNOSIS — G4733 Obstructive sleep apnea (adult) (pediatric): Secondary | ICD-10-CM | POA: Diagnosis not present

## 2021-05-26 DIAGNOSIS — I10 Essential (primary) hypertension: Secondary | ICD-10-CM | POA: Diagnosis not present

## 2021-05-26 HISTORY — PX: COLONOSCOPY WITH PROPOFOL: SHX5780

## 2021-05-26 MED ORDER — SODIUM CHLORIDE 0.9 % IV SOLN: 500.0000 mL | Freq: Once | INTRAVENOUS | Status: DC

## 2021-05-26 NOTE — Patient Instructions (Signed)
Discharge instructions given. ?Handouts on polyps and Hemorrhoids. ?Resume previous medications. ?YOU HAD AN ENDOSCOPIC PROCEDURE TODAY AT [facility]:   Refer to the procedure report that was given to you for any specific questions about what was found during the examination.  If the procedure report does not answer your questions, please call your gastroenterologist to clarify.  If you requested that your care partner not be given the details of your procedure findings, then the procedure report has been included in a sealed envelope for you to review at your convenience later. ? ?YOU SHOULD EXPECT: Some feelings of bloating in the abdomen. Passage of more gas than usual.  Walking can help get rid of the air that was put into your GI tract during the procedure and reduce the bloating. If you had a lower endoscopy (such as a colonoscopy or flexible sigmoidoscopy) you may notice spotting of blood in your stool or on the toilet paper. If you underwent a bowel prep for your procedure, you may not have a normal bowel movement for a few days. ? ?Please Note:  You might notice some irritation and congestion in your nose or some drainage.  This is from the oxygen used during your procedure.  There is no need for concern and it should clear up in a day or so. ? ?SYMPTOMS TO REPORT IMMEDIATELY: ? ?Following lower endoscopy (colonoscopy or flexible sigmoidoscopy): ? Excessive amounts of blood in the stool ? Significant tenderness or worsening of abdominal pains ? Swelling of the abdomen that is new, acute ? Fever of 100?F or higher ? ? ?For urgent or emergent issues, a gastroenterologist can be reached at any hour by calling [phone]. ?Do not use MyChart messaging for urgent concerns.  ? ? ?DIET:  We do recommend a small meal at first, but then you may proceed to your regular diet.  Drink plenty of fluids but you should avoid alcoholic beverages for 24 hours. ? ?ACTIVITY:  You should plan to take it  easy for the rest of today and you should NOT DRIVE or use heavy machinery until tomorrow (because of the sedation medicines used during the test).   ? ?FOLLOW UP: ?Our staff will call the number listed on your records 48-72 hours following your procedure to check on you and address any questions or concerns that you may have regarding the information given to you following your procedure. If we do not reach you, we will leave a message.  We will attempt to reach you two times.  During this call, we will ask if you have developed any symptoms of COVID 19. If you develop any symptoms (ie: fever, flu-like symptoms, shortness of breath, cough etc.) before then, please call [phone].  If you test positive for Covid 19 in the 2 weeks post procedure, please call and report this information to [place].   ? ?If any biopsies were taken you will be contacted by phone or by letter within the next 1-3 weeks.  Please call us at [phone] if you have not heard about the biopsies in 3 weeks.  ? ? ?SIGNATURES/CONFIDENTIALITY: ?You and/or your care partner have signed paperwork which will be entered into your electronic medical record.  These signatures attest to the fact that that the information above on your After Visit Summary has been reviewed and is understood.  Full responsibility of the confidentiality of this discharge information lies with you and/or your care-partner.  ?

## 2021-05-26 NOTE — Op Note (Signed)
[name] ?Patient Name: Philip Woodard ?Procedure Date: 05/26/2021 10:29 AM ?MRN: [id number] ?Endoscopist: Ladene Artist , MD ?Age: 71 ?Referring MD:  ?Date of Birth: October 06, 1950 ?Gender: Male ?Account #: [id number] ?Procedure:                Colonoscopy ?Indications:              Surveillance: Personal history of adenomatous  ?                          polyps on last colonoscopy > 3 years ago ?Medicines:                Monitored Anesthesia Care ?Procedure:                Pre-Anesthesia Assessment: ?                          - Prior to the procedure, a History and Physical  ?                          was performed, and patient medications and  ?                          allergies were reviewed. The patient's tolerance of  ?                          previous anesthesia was also reviewed. The risks  ?                          and benefits of the procedure and the sedation  ?                          options and risks were discussed with the patient.  ?                          All questions were answered, and informed consent  ?                          was obtained. Prior Anticoagulants: The patient has  ?                          taken no previous anticoagulant or antiplatelet  ?                          agents. ASA Grade Assessment: II - A patient with  ?                          mild systemic disease. After reviewing the risks  ?                          and benefits, the patient was deemed in  ?                          satisfactory condition to undergo the procedure. ?  After obtaining informed consent, the colonoscope  ?                          was passed under direct vision. Throughout the  ?                          procedure, the patient's blood pressure, pulse, and  ?                          oxygen saturations were monitored continuously. The  ?                          CF [id number] #[id number] was introduced through the anus  ?                          and advanced to the  the cecum, identified by  ?                          appendiceal orifice and ileocecal valve. The  ?                          ileocecal valve, appendiceal orifice, and rectum  ?                          were photographed. The quality of the bowel  ?                          preparation was good. The colonoscopy was performed  ?                          without difficulty. The patient tolerated the  ?                          procedure well. ?Scope In: 10:43:49 AM ?Scope Out: 10:59:14 AM ?Scope Withdrawal Time: 0 hours 13 minutes 0 seconds  ?Total Procedure Duration: 0 hours 15 minutes 25 seconds  ?Findings:                 Skin tags were found on perianal exam. ?                          A 6 mm polyp was found in the sigmoid colon. The  ?                          polyp was sessile. The polyp was removed with a  ?                          cold snare. Resection and retrieval were complete. ?                          Internal hemorrhoids were found during  ?                          retroflexion. The hemorrhoids were moderate and  ?  Grade I (internal hemorrhoids that do not prolapse). ?                          The exam was otherwise without abnormality on  ?                          direct and retroflexion views. ?Complications:            No immediate complications. Estimated blood loss:  ?                          None. ?Estimated Blood Loss:     Estimated blood loss: none. ?Impression:               - Perianal skin tags found on perianal exam. ?                          - One 6 mm polyp in the sigmoid colon, removed with  ?                          a cold snare. Resected and retrieved. ?                          - Internal hemorrhoids. ?                          - The examination was otherwise normal on direct  ?                          and retroflexion views. ?Recommendation:           - Repeat colonoscopy date to be determined after  ?                          pending pathology results  are reviewed for  ?                          surveillance based on pathology results. ?                          - Patient has a contact number available for  ?                          emergencies. The signs and symptoms of potential  ?                          delayed complications were discussed with the  ?                          patient. Return to normal activities tomorrow.  ?                          Written discharge instructions were provided to the  ?                          patient. ?                          -  Resume previous diet. ?                          - Continue present medications. ?                          - Await pathology results. ?Ladene Artist, MD ?05/26/2021 11:01:51 AM ?This report has been signed electronically. ?

## 2021-05-26 NOTE — Progress Notes (Signed)
? ?History & Physical ? ?Primary Care Physician:  Horald Pollen, MD ?Primary Gastroenterologist: Lucio Edward, MD ? ?CHIEF COMPLAINT:  Personal history of colon polyps  ? ?HPI: Philip Woodard is a 71 y.o. male with a personal history of a 1 cm adenomatous colon polyp in 2019 for surveillance colonoscopy. ? ? ?Past Medical History:  ?Diagnosis Date  ? Alkaline phosphatase raised 06/28/2011  ? Fluctuating - suspect due to sarcoidosis  ? Allergic rhinitis   ? Allergy   ? Arthritis   ? knees  ? Genital herpes   ? Hemorrhoids   ? Hyperglycemia   ? Hyperplastic lymph node 06/27/2011  ? Submental node excised 1/02; regrowth: re-excision 10/06  ? Hypertension   ? Hypothyroidism   ? Multinodular goiter   ? Routine general medical examination at a health care facility   ? Sarcoidosis   ? Sleep apnea   ? wears cpap   ? ? ?Past Surgical History:  ?Procedure Laterality Date  ? COLONOSCOPY  5-23/2003  ? COLONOSCOPY  02/04/2018  ? TA  ? COLONOSCOPY WITH PROPOFOL  05/26/2021  ? Lucio Edward at [facility]  ? POLYPECTOMY    ? ROTATOR CUFF REPAIR Bilateral 2005,2007  ? THYROIDECTOMY  2002  ? ? ?Prior to Admission medications   ?Medication Sig Start Date End Date Taking? Authorizing Provider  ?acetaminophen (TYLENOL) 650 MG CR tablet Take 650 mg by mouth every 8 (eight) hours as needed.   Yes [provider]  ?acyclovir (ZOVIRAX) 800 MG tablet TAKE 1 TABLET BY MOUTH DAILY. 01/20/21  Yes Marrian Salvage, FNP  ?amLODipine (NORVASC) 10 MG tablet TAKE 1 TABLET BY MOUTH EVERY DAY 04/25/21  Yes Marrian Salvage, FNP  ?cholecalciferol (VITAMIN D3) 25 MCG (1000 UT) tablet Take 1,000 Units by mouth daily.   Yes [provider]  ?Glucosamine-Chondroit-Vit C-Mn (GLUCOSAMINE 1500 COMPLEX) CAPS Take by mouth daily.   Yes [provider]  ?levothyroxine (SYNTHROID) 175 MCG tablet TAKE 1 TABLET BY MOUTH EVERY DAY 04/08/21  Yes Sagardia, Ines Bloomer, MD  ?Multiple Vitamin (MULTIVITAMIN WITH MINERALS) TABS  tablet Take 1 tablet by mouth daily.   Yes [provider]  ?Multiple Vitamin (MULTIVITAMIN) LIQD Take 5 mLs by mouth daily.   Yes [provider]  ?Omega-3 Fatty Acids (FISH OIL) 1000 MG CAPS Take 2 capsules by mouth daily.   Yes [provider]  ?Turmeric 500 MG TABS Take 1 tablet by mouth 2 (two) times daily.   Yes [provider]  ?valsartan (DIOVAN) 160 MG tablet Take 1 tablet (160 mg total) by mouth daily. 03/31/21  Yes SagardiaInes Bloomer, MD  ?zinc gluconate 50 MG tablet Take 50 mg by mouth daily.   Yes [provider]  ?sildenafil (REVATIO) 20 MG tablet TAKE 5 TABLETS AS DIRECTED 30-45 MINUTES PRIOR TO INTERCOURSE ?Patient not taking: Reported on 05/26/2021 03/31/21   Horald Pollen, MD  ? ? ?Current Outpatient Medications  ?Medication Sig Dispense Refill  ? acetaminophen (TYLENOL) 650 MG CR tablet Take 650 mg by mouth every 8 (eight) hours as needed.    ? acyclovir (ZOVIRAX) 800 MG tablet TAKE 1 TABLET BY MOUTH DAILY. 90 tablet 0  ? amLODipine (NORVASC) 10 MG tablet TAKE 1 TABLET BY MOUTH EVERY DAY 90 tablet 0  ? cholecalciferol (VITAMIN D3) 25 MCG (1000 UT) tablet Take 1,000 Units by mouth daily.    ? Glucosamine-Chondroit-Vit C-Mn (GLUCOSAMINE 1500 COMPLEX) CAPS Take by mouth daily.    ? levothyroxine (SYNTHROID)  175 MCG tablet TAKE 1 TABLET BY MOUTH EVERY DAY 90 tablet 0  ? Multiple Vitamin (MULTIVITAMIN WITH MINERALS) TABS tablet Take 1 tablet by mouth daily.    ? Multiple Vitamin (MULTIVITAMIN) LIQD Take 5 mLs by mouth daily.    ? Omega-3 Fatty Acids (FISH OIL) 1000 MG CAPS Take 2 capsules by mouth daily.    ? Turmeric 500 MG TABS Take 1 tablet by mouth 2 (two) times daily.    ? valsartan (DIOVAN) 160 MG tablet Take 1 tablet (160 mg total) by mouth daily. 90 tablet 3  ? zinc gluconate 50 MG tablet Take 50 mg by mouth daily.    ? sildenafil (REVATIO) 20 MG tablet TAKE 5 TABLETS AS DIRECTED 30-45 MINUTES PRIOR TO INTERCOURSE (Patient not taking:  Reported on 05/26/2021) 50 tablet 5  ? ?Current Facility-Administered Medications  ?Medication Dose Route Frequency Provider Last Rate Last Admin  ? 0.9 %  sodium chloride infusion  500 mL Intravenous Once Ladene Artist, MD      ? ? ?Allergies as of 05/26/2021 - Review Complete 05/26/2021  ?Allergen Reaction Noted  ? Other  09/19/2010  ? Shellfish allergy Swelling 03/29/2012  ? ? ?Family History  ?Problem Relation Age of Onset  ? Sleep apnea Brother   ? Sleep apnea Paternal Aunt   ? Asthma Cousin   ? Sarcoidosis Neg Hx   ? Colon polyps Neg Hx   ? Colon cancer Neg Hx   ? Esophageal cancer Neg Hx   ? Stomach cancer Neg Hx   ? Rectal cancer Neg Hx   ? ? ?Social History  ? ?Socioeconomic History  ? Marital status: Married  ?  Spouse name: Not on file  ? Number of children: Not on file  ? Years of education: Not on file  ? Highest education level: Not on file  ?Occupational History  ? Not on file  ?Tobacco Use  ? Smoking status: Never  ?  Passive exposure: Never  ? Smokeless tobacco: Never  ?Vaping Use  ? Vaping Use: Never used  ?Substance and Sexual Activity  ? Alcohol use: Yes  ?  Comment: 3 times per month  ? Drug use: Never  ? Sexual activity: Not on file  ?Other Topics Concern  ? Not on file  ?Social History Narrative  ? Not on file  ? ?Social Determinants of Health  ? ?Financial Resource Strain: Low Risk   ? Difficulty of Paying Living Expenses: Not hard at all  ?Food Insecurity: No Food Insecurity  ? Worried About Charity fundraiser in the Last Year: Never true  ? Ran Out of Food in the Last Year: Never true  ?Transportation Needs: No Transportation Needs  ? Lack of Transportation (Medical): No  ? Lack of Transportation (Non-Medical): No  ?Physical Activity: Sufficiently Active  ? Days of Exercise per Week: 3 days  ? Minutes of Exercise per Session: 60 min  ?Stress: No Stress Concern Present  ? Feeling of Stress : Not at all  ?Social Connections: Moderately Integrated  ? Frequency of Communication with Friends  and Family: Twice a week  ? Frequency of Social Gatherings with Friends and Family: Twice a week  ? Attends Religious Services: More than 4 times per year  ? Active Member of Clubs or Organizations: No  ? Attends Archivist Meetings: Never  ? Marital Status: Married  ?Intimate Partner Violence: Not At Risk  ? Fear of Current or Ex-Partner: No  ? Emotionally Abused:  No  ? Physically Abused: No  ? Sexually Abused: No  ? ? ?Review of Systems: ? ?All systems reviewed an negative except where noted in HPI. ? ?Gen: Denies any fever, chills, sweats, anorexia, fatigue, weakness, malaise, weight loss, and sleep disorder ?CV: Denies chest pain, angina, palpitations, syncope, orthopnea, PND, peripheral edema, and claudication. ?Resp: Denies dyspnea at rest, dyspnea with exercise, cough, sputum, wheezing, coughing up blood, and pleurisy. ?GI: Denies vomiting blood, jaundice, and fecal incontinence.   Denies dysphagia or odynophagia. ?GU : Denies urinary burning, blood in urine, urinary frequency, urinary hesitancy, nocturnal urination, and urinary incontinence. ?MS: Denies joint pain, limitation of movement, and swelling, stiffness, low back pain, extremity pain. Denies muscle weakness, cramps, atrophy.  ?Derm: Denies rash, itching, dry skin, hives, moles, warts, or unhealing ulcers.  ?Psych: Denies depression, anxiety, memory loss, suicidal ideation, hallucinations, paranoia, and confusion. ?Heme: Denies bruising, bleeding, and enlarged lymph nodes. ?Neuro:  Denies any headaches, dizziness, paresthesias. ?Endo:  Denies any problems with DM, thyroid, adrenal function. ? ? ?Physical Exam: ?General:  Alert, well-developed, in NAD ?Head:  Normocephalic and atraumatic. ?Eyes:  Sclera clear, no icterus.   Conjunctiva pink. ?Ears:  Normal auditory acuity. ?Mouth:  No deformity or lesions.  ?Neck:  Supple; no masses . ?Lungs:  Clear throughout to auscultation.   No wheezes, crackles, or rhonchi. No acute distress. ?Heart:   Regular rate and rhythm; no murmurs. ?Abdomen:  Soft, nondistended, nontender. No masses, hepatomegaly. No obvious masses.  Normal bowel .    ?Rectal:  Deferred   ?Msk:  Symmetrical without gross deformities.Marland Kitchen ?

## 2021-05-26 NOTE — Progress Notes (Signed)
Pt's states no medical or surgical changes since previsit or office visit.  ? ?VS CW ?

## 2021-05-26 NOTE — Progress Notes (Signed)
Report to PACU, RN, vss, BBS= Clear.  

## 2021-05-26 NOTE — Progress Notes (Signed)
Called to room to assist during endoscopic procedure.  Patient ID and intended procedure confirmed with present staff. Received instructions for my participation in the procedure from the performing physician.  

## 2021-05-27 DIAGNOSIS — H524 Presbyopia: Secondary | ICD-10-CM | POA: Diagnosis not present

## 2021-05-27 DIAGNOSIS — H2513 Age-related nuclear cataract, bilateral: Secondary | ICD-10-CM | POA: Diagnosis not present

## 2021-05-30 ENCOUNTER — Telehealth: Payer: Self-pay

## 2021-05-30 NOTE — Progress Notes (Signed)
? ?Synopsis: Referred for sarcoid by Marrian Salvage,* ? ?Subjective:  ? ?PATIENT ID: Philip Woodard GENDER: male DOB: [birth year], MRN: [id number] ? ?Chief Complaint  ?Patient presents with  ? Follow-up  ?  PFT's done today. Breathing is overall doing well and no new co's today.   ? ?70yM with history of sarcoid (TBLB with Convent granuloma and neg AFB/fungal stains in 2002), OSA on CPAP, CKD3a, multinodular goiter, rhinitis ? ?Regarding his sarcoidosis he was only only prednisone for 3 months after diagnosis in 2002.  ? ?He has no trouble breathing, including with exertion, no cough. No CP, vision changes including no photophobia, palpitations.  ? ?Some indigestion 2-3x per week. Usually after eating. Especially at night, can frequently have sour taste in his mouth. Takes tums with some improvement. He thinks he was on ppi at some point in the past - didn't want to remain on it indefinitely. ? ?Takes claritin consistently, flonase intermittently for rhinitis. He thinks claritin is more helpful though.  ? ?Two aunts on father's side probably had sarcoid as well ? ?He works as a Landscape architect. He has never lived outside of [place]. Went to [facility] for thanksgiving. No pets at home. Never smoker.  ? ?Interval HPI: ?Last seen by me 02/24/21 at which point discussed GERD and ensured he had surveillance PFT, EKG, BMP.  ? ?Seen by sports med 04/27/21 for neck pain, shoulder pain and stiffness - had trouble for a while even getting out of bed, elevated CRP, encouraged to follow with rheumatology. 1-1.5 hr morning stiffness, improves with exercise. ? ?He has no dyspnea with exertion. No CP. No cough. No vision changes other than visual acuity declining a bit but no photophobia, palpitations. ? ?Otherwise pertinent review of systems is negative.  ? ? ?Past Medical History:  ?Diagnosis Date  ? Alkaline phosphatase raised 06/28/2011  ? Fluctuating - suspect due to sarcoidosis  ? Allergic rhinitis   ? Allergy   ? Arthritis    ? knees  ? Genital herpes   ? Hemorrhoids   ? Hyperglycemia   ? Hyperplastic lymph node 06/27/2011  ? Submental node excised 1/02; regrowth: re-excision 10/06  ? Hypertension   ? Hypothyroidism   ? Multinodular goiter   ? Routine general medical examination at a health care facility   ? Sarcoidosis   ? Sleep apnea   ? wears cpap   ?  ? ?Family History  ?Problem Relation Age of Onset  ? Sleep apnea Brother   ? Sleep apnea Paternal Aunt   ? Asthma Cousin   ? Sarcoidosis Neg Hx   ? Colon polyps Neg Hx   ? Colon cancer Neg Hx   ? Esophageal cancer Neg Hx   ? Stomach cancer Neg Hx   ? Rectal cancer Neg Hx   ?  ? ?Past Surgical History:  ?Procedure Laterality Date  ? COLONOSCOPY  5-23/2003  ? COLONOSCOPY  02/04/2018  ? TA  ? COLONOSCOPY WITH PROPOFOL  05/26/2021  ? Lucio Edward at [facility]  ? POLYPECTOMY    ? ROTATOR CUFF REPAIR Bilateral 2005,2007  ? THYROIDECTOMY  2002  ? ? ?Social History  ? ?Socioeconomic History  ? Marital status: Married  ?  Spouse name: Not on file  ? Number of children: Not on file  ? Years of education: Not on file  ? Highest education level: Not on file  ?Occupational History  ? Not on file  ?Tobacco Use  ? Smoking status: Never  ?  Passive exposure: Never  ? Smokeless tobacco: Never  ?Vaping Use  ? Vaping Use: Never used  ?Substance and Sexual Activity  ? Alcohol use: Yes  ?  Comment: 3 times per month  ? Drug use: Never  ? Sexual activity: Not on file  ?Other Topics Concern  ? Not on file  ?Social History Narrative  ? Not on file  ? ?Social Determinants of Health  ? ?Financial Resource Strain: Low Risk   ? Difficulty of Paying Living Expenses: Not hard at all  ?Food Insecurity: No Food Insecurity  ? Worried About Charity fundraiser in the Last Year: Never true  ? Ran Out of Food in the Last Year: Never true  ?Transportation Needs: No Transportation Needs  ? Lack of Transportation (Medical): No  ? Lack of Transportation (Non-Medical): No  ?Physical Activity: Sufficiently Active  ? Days of  Exercise per Week: 3 days  ? Minutes of Exercise per Session: 60 min  ?Stress: No Stress Concern Present  ? Feeling of Stress : Not at all  ?Social Connections: Moderately Integrated  ? Frequency of Communication with Friends and Family: Twice a week  ? Frequency of Social Gatherings with Friends and Family: Twice a week  ? Attends Religious Services: More than 4 times per year  ? Active Member of Clubs or Organizations: No  ? Attends Archivist Meetings: Never  ? Marital Status: Married  ?Intimate Partner Violence: Not At Risk  ? Fear of Current or Ex-Partner: No  ? Emotionally Abused: No  ? Physically Abused: No  ? Sexually Abused: No  ?  ? ?Allergies  ?Allergen Reactions  ? Other   ?  SEAFOOD  ? Shellfish Allergy Swelling  ?  Tongue and facial Swelling ?Tongue and facial Swelling  ?  ? ?Outpatient Medications Prior to Visit  ?Medication Sig Dispense Refill  ? acetaminophen (TYLENOL) 650 MG CR tablet Take 650 mg by mouth every 8 (eight) hours as needed.    ? acyclovir (ZOVIRAX) 800 MG tablet TAKE 1 TABLET BY MOUTH DAILY. 90 tablet 0  ? amLODipine (NORVASC) 10 MG tablet TAKE 1 TABLET BY MOUTH EVERY DAY 90 tablet 0  ? cholecalciferol (VITAMIN D3) 25 MCG (1000 UT) tablet Take 1,000 Units by mouth daily.    ? Glucosamine-Chondroit-Vit C-Mn (GLUCOSAMINE 1500 COMPLEX) CAPS Take by mouth daily.    ? levothyroxine (SYNTHROID) 175 MCG tablet TAKE 1 TABLET BY MOUTH EVERY DAY 90 tablet 0  ? Multiple Vitamin (MULTIVITAMIN WITH MINERALS) TABS tablet Take 1 tablet by mouth daily.    ? Multiple Vitamin (MULTIVITAMIN) LIQD Take 5 mLs by mouth daily.    ? Omega-3 Fatty Acids (FISH OIL) 1000 MG CAPS Take 2 capsules by mouth daily.    ? sildenafil (REVATIO) 20 MG tablet TAKE 5 TABLETS AS DIRECTED 30-45 MINUTES PRIOR TO INTERCOURSE 50 tablet 5  ? Turmeric 500 MG TABS Take 1 tablet by mouth 2 (two) times daily.    ? valsartan (DIOVAN) 160 MG tablet Take 1 tablet (160 mg total) by mouth daily. 90 tablet 3  ? zinc gluconate 50  MG tablet Take 50 mg by mouth daily.    ? ?No facility-administered medications prior to visit.  ? ? ? ? ? ?Objective:  ? ?Physical Exam: ? ?General appearance: 71 y.o., male, NAD, conversant  ?Eyes: anicteric sclerae; PERRL, tracking appropriately ?HENT: NCAT; MMM ?Neck: Trachea midline; no lymphadenopathy, no JVD ?Lungs: CTAB, no crackles, no wheeze, with normal respiratory effort ?CV: RRR,  no murmur  ?Abdomen: Soft, non-tender; non-distended, BS present  ?Extremities: No peripheral edema, warm ?Skin: Normal turgor and texture; no rash ?Psych: Appropriate affect ?Neuro: Alert and oriented to person and place, no focal deficit  ? ? ? ?Vitals:  ? 05/31/21 1002  ?BP: 140/76  ?Pulse: 90  ?Temp: 97.9 ?F (36.6 ?C)  ?TempSrc: Oral  ?SpO2: 99%  ?Weight: 204 lb (92.5 kg)  ?Height: 5' 7.5" (1.715 m)  ? ? ?99% on RA ?BMI Readings from Last 3 Encounters:  ?05/31/21 31.48 kg/m?  ?05/26/21 31.17 kg/m?  ?05/12/21 31.17 kg/m?  ? ?Wt Readings from Last 3 Encounters:  ?05/31/21 204 lb (92.5 kg)  ?05/26/21 199 lb (90.3 kg)  ?05/12/21 199 lb (90.3 kg)  ? ? ? ?CBC ?   ?Component Value Date/Time  ? WBC 9.5 06/30/2020 0942  ? RBC 4.59 06/30/2020 0942  ? HGB 13.8 06/30/2020 0942  ? HGB 15.5 06/23/2011 1637  ? HCT 41.2 06/30/2020 0942  ? HCT 45.9 06/23/2011 1637  ? PLT 265.0 06/30/2020 0942  ? PLT 218 06/23/2011 1637  ? MCV 89.7 06/30/2020 0942  ? MCV 91.1 06/23/2011 1637  ? MCH 30.4 12/10/2013 1014  ? MCHC 33.4 06/30/2020 0942  ? RDW 15.0 06/30/2020 0942  ? RDW 14.4 06/23/2011 1637  ? LYMPHSABS 2.5 06/30/2020 0942  ? LYMPHSABS 3.0 06/23/2011 1637  ? MONOABS 1.1 (H) 06/30/2020 0383  ? MONOABS 0.6 06/23/2011 1637  ? EOSABS 0.3 06/30/2020 0942  ? EOSABS 0.3 06/23/2011 1637  ? BASOSABS 0.1 06/30/2020 0942  ? BASOSABS 0.1 06/23/2011 1637  ? ? ?Chest Imaging: ?CXR 2018 reviewed by me with prominent hila, interstitium ? ? ?Echocardiogram:  ? ?Stress echo 2018: ? NORMAL STRESS TEST. NORMAL RESTING STUDY WITH NO WALL MOTION ABNORMALITIES  ? AT  REST AND PEAK STRESS.  ? NO DOPPLER PERFORMED FOR VALVULAR REGURGITATION  ? NO DOPPLER PERFORMED FOR VALVULAR STENOSIS  ? Note: BASELINE PROTOCOL  ? PATIENT WENT INTO SECOND DEGREE HEART BLOCK WHEN ENTERING

## 2021-05-30 NOTE — Telephone Encounter (Signed)
Left message on answering machine. 

## 2021-05-31 ENCOUNTER — Ambulatory Visit (INDEPENDENT_AMBULATORY_CARE_PROVIDER_SITE_OTHER): Payer: Medicare HMO

## 2021-05-31 ENCOUNTER — Ambulatory Visit (INDEPENDENT_AMBULATORY_CARE_PROVIDER_SITE_OTHER): Payer: Medicare HMO | Admitting: Student

## 2021-05-31 ENCOUNTER — Encounter: Payer: Self-pay | Admitting: Student

## 2021-05-31 ENCOUNTER — Ambulatory Visit: Payer: Medicare HMO | Admitting: Student

## 2021-05-31 ENCOUNTER — Other Ambulatory Visit: Payer: Self-pay

## 2021-05-31 VITALS — BP 140/76 | HR 90 | Temp 97.9°F | Ht 67.5 in | Wt 204.0 lb

## 2021-05-31 DIAGNOSIS — R918 Other nonspecific abnormal finding of lung field: Secondary | ICD-10-CM | POA: Diagnosis not present

## 2021-05-31 DIAGNOSIS — D869 Sarcoidosis, unspecified: Secondary | ICD-10-CM

## 2021-05-31 LAB — COMPREHENSIVE METABOLIC PANEL
ALT: 26 U/L (ref 0–53)
AST: 52 U/L — ABNORMAL HIGH (ref 0–37)
Albumin: 4.3 g/dL (ref 3.5–5.2)
Alkaline Phosphatase: 67 U/L (ref 39–117)
BUN: 18 mg/dL (ref 6–23)
CO2: 28 mEq/L (ref 19–32)
Calcium: 9.3 mg/dL (ref 8.4–10.5)
Chloride: 104 mEq/L (ref 96–112)
Creatinine, Ser: 1.39 mg/dL (ref 0.40–1.50)
GFR: 51.92 mL/min — ABNORMAL LOW (ref >60.00)
Glucose, Bld: 113 mg/dL — ABNORMAL HIGH (ref 70–99)
Potassium: 3.5 mEq/L (ref 3.5–5.1)
Sodium: 139 mEq/L (ref 135–145)
Total Bilirubin: 0.7 mg/dL (ref 0.2–1.2)
Total Protein: 7.1 g/dL (ref 6.0–8.3)

## 2021-05-31 LAB — CBC WITH DIFFERENTIAL/PLATELET
Basophils Absolute: 0.1 10*3/uL (ref 0.0–0.1)
Basophils Relative: 0.9 % (ref 0.0–3.0)
Eosinophils Absolute: 0.3 10*3/uL (ref 0.0–0.7)
Eosinophils Relative: 3.6 % (ref 0.0–5.0)
HCT: 40.2 % (ref 39.0–52.0)
Hemoglobin: 13.3 g/dL (ref 13.0–17.0)
Lymphocytes Relative: 29.8 % (ref 12.0–46.0)
Lymphs Abs: 2.5 10*3/uL (ref 0.7–4.0)
MCHC: 33.1 g/dL (ref 30.0–36.0)
MCV: 91.7 fl (ref 78.0–100.0)
Monocytes Absolute: 0.8 10*3/uL (ref 0.1–1.0)
Monocytes Relative: 9.1 % (ref 3.0–12.0)
Neutro Abs: 4.7 10*3/uL (ref 1.4–7.7)
Neutrophils Relative %: 56.6 % (ref 43.0–77.0)
Platelets: 258 10*3/uL (ref 150.0–400.0)
RBC: 4.38 Mil/uL (ref 4.22–5.81)
RDW: 14.6 % (ref 11.5–15.5)
WBC: 8.3 10*3/uL (ref 4.0–10.5)

## 2021-05-31 LAB — PULMONARY FUNCTION TEST
DL/VA % pred: 121 %
DL/VA: 5.03 ml/min/mmHg/L
DLCO cor % pred: 90 %
DLCO cor: 22 ml/min/mmHg
DLCO unc % pred: 90 %
DLCO unc: 22 ml/min/mmHg
FEF 25-75 Post: 2.51 L/sec
FEF 25-75 Pre: 3.13 L/sec
FEF2575-%Change-Post: -19 %
FEF2575-%Pred-Post: 107 %
FEF2575-%Pred-Pre: 133 %
FEV1-%Change-Post: -5 %
FEV1-%Pred-Post: 93 %
FEV1-%Pred-Pre: 99 %
FEV1-Post: 2.48 L
FEV1-Pre: 2.63 L
FEV1FVC-%Change-Post: 1 %
FEV1FVC-%Pred-Pre: 108 %
FEV6-%Change-Post: -7 %
FEV6-%Pred-Post: 87 %
FEV6-%Pred-Pre: 94 %
FEV6-Post: 2.92 L
FEV6-Pre: 3.15 L
FEV6FVC-%Change-Post: 0 %
FEV6FVC-%Pred-Post: 104 %
FEV6FVC-%Pred-Pre: 104 %
FVC-%Change-Post: -7 %
FVC-%Pred-Post: 83 %
FVC-%Pred-Pre: 89 %
FVC-Post: 2.92 L
FVC-Pre: 3.15 L
Post FEV1/FVC ratio: 85 %
Post FEV6/FVC ratio: 100 %
Pre FEV1/FVC ratio: 84 %
Pre FEV6/FVC Ratio: 100 %
RV % pred: 83 %
RV: 1.91 L
TLC % pred: 91 %
TLC: 5.95 L

## 2021-05-31 MED ORDER — PREDNISONE 5 MG PO TABS: ORAL_TABLET | ORAL | 0 refills | Status: DC

## 2021-05-31 NOTE — Patient Instructions (Signed)
-   Prednisone taper for month long course, see instructions. If feeling worse off prednisone send me a my chart message or call and we can renew prescription ?- labs today, I will call you and let you know if we can start methotrexate. If we start it we'll need to do labs monthly for 3 months at least.  ?- see you in 3 months ? ?

## 2021-05-31 NOTE — Patient Instructions (Signed)
Full PFT performed today. °

## 2021-05-31 NOTE — Progress Notes (Signed)
Full PFT performed today. °

## 2021-06-01 LAB — HEPATITIS C ANTIBODY
Hepatitis C Ab: NONREACTIVE
SIGNAL TO CUT-OFF: 0.11 (ref <1.00)

## 2021-06-01 LAB — HEPATITIS B SURFACE ANTIGEN: Hepatitis B Surface Ag: NONREACTIVE

## 2021-06-01 LAB — HEPATITIS B CORE ANTIBODY, TOTAL: Hep B Core Total Ab: NONREACTIVE

## 2021-06-03 ENCOUNTER — Telehealth: Payer: Self-pay | Admitting: Student

## 2021-06-13 ENCOUNTER — Encounter: Payer: Self-pay | Admitting: Gastroenterology

## 2021-06-13 ENCOUNTER — Encounter: Payer: Self-pay | Admitting: Student

## 2021-06-14 ENCOUNTER — Telehealth: Payer: Self-pay | Admitting: Pharmacist

## 2021-06-14 DIAGNOSIS — Z7189 Other specified counseling: Secondary | ICD-10-CM

## 2021-06-14 DIAGNOSIS — Z111 Encounter for screening for respiratory tuberculosis: Secondary | ICD-10-CM

## 2021-06-14 DIAGNOSIS — Z79899 Other long term (current) drug therapy: Secondary | ICD-10-CM

## 2021-06-14 DIAGNOSIS — D869 Sarcoidosis, unspecified: Secondary | ICD-10-CM

## 2021-06-14 MED ORDER — PREDNISONE 5 MG PO TABS: 5.0000 mg | ORAL_TABLET | Freq: Every day | ORAL | 0 refills | Status: DC

## 2021-06-14 NOTE — Telephone Encounter (Signed)
Dr. Verlee Monte, please see pt's email regarding his lab work. Thank you! ?

## 2021-06-14 NOTE — Telephone Encounter (Signed)
Can we start methotrexate 5 mg weekly, target dose 10 mg weekly for sarcoid arthropathy? He does have AST of 52 on baseline labs. I've extended his low dose prednisone course another month. Let me know if I need to put in any orders for labs, etc. ? ?Thanks!! ? ?

## 2021-06-14 NOTE — Telephone Encounter (Incomplete)
Pharmacy Note ? ?Subjective: ?Patient called today by [facility] for follow up office visit. Patient seen by the pharmacist for counseling on methotrexate for pulmonary sarcoidosis. Prior therapy includes:***. ? ?Objective: ?CBC ?   ?Component Value Date/Time  ? WBC 8.3 05/31/2021 1040  ? RBC 4.38 05/31/2021 1040  ? HGB 13.3 05/31/2021 1040  ? HGB 15.5 06/23/2011 1637  ? HCT 40.2 05/31/2021 1040  ? HCT 45.9 06/23/2011 1637  ? PLT 258.0 05/31/2021 1040  ? PLT 218 06/23/2011 1637  ? MCV 91.7 05/31/2021 1040  ? MCV 91.1 06/23/2011 1637  ? MCH 30.4 12/10/2013 1014  ? MCHC 33.1 05/31/2021 1040  ? RDW 14.6 05/31/2021 1040  ? RDW 14.4 06/23/2011 1637  ? LYMPHSABS 2.5 05/31/2021 1040  ? LYMPHSABS 3.0 06/23/2011 1637  ? MONOABS 0.8 05/31/2021 1040  ? MONOABS 0.6 06/23/2011 1637  ? EOSABS 0.3 05/31/2021 1040  ? EOSABS 0.3 06/23/2011 1637  ? BASOSABS 0.1 05/31/2021 1040  ? BASOSABS 0.1 06/23/2011 1637  ? ? ?CMP  ?   ?Component Value Date/Time  ? NA 139 05/31/2021 1040  ? K 3.5 05/31/2021 1040  ? CL 104 05/31/2021 1040  ? CO2 28 05/31/2021 1040  ? GLUCOSE 113 (H) 05/31/2021 1040  ? BUN 18 05/31/2021 1040  ? CREATININE 1.39 05/31/2021 1040  ? CREATININE 1.34 12/10/2013 1014  ? CALCIUM 9.3 05/31/2021 1040  ? PROT 7.1 05/31/2021 1040  ? ALBUMIN 4.3 05/31/2021 1040  ? AST 52 (H) 05/31/2021 1040  ? ALT 26 05/31/2021 1040  ? ALKPHOS 67 05/31/2021 1040  ? BILITOT 0.7 05/31/2021 1040  ? GFRNONAA 86.70 02/15/2010 0756  ? GFRAA  02/20/2009 0336  ?  >60        ?The eGFR has been calculated ?using the MDRD equation. ?This calculation has not been ?validated in all clinical ?situations. ?eGFR's persistently ?<60 mL/min signify ?possible Chronic Kidney Disease.  ? ? ?Baseline Immunosuppressant Therapy Labs ?TB GOLD - order placed to be drawn 2 weeks after starting MTX ?  ?Hepatitis Panel ? ?  Latest Ref Rng & Units 05/31/2021  ? 10:40 AM  ?Hepatitis  ?Hep B Surface Ag NON-REACTIVE NON-REACTIVE    ?Hep C Ab NON-REACTIVE NON-REACTIVE     ? ?HIV ?Lab Results  ?Component Value Date  ? HIV NONREACTIVE 02/22/2016  ? ?Chest-xray:  *** ? ?Contraception: *** ? ?Alcohol use: *** ? ?Assessment/Plan:  ? ?Patient was counseled on the purpose, proper use, and adverse effects of methotrexate including nausea, infection, and signs and symptoms of pneumonitis. Discussed that there is the possibility of an increased risk of malignancy, specifically lymphomas, but it is not well understood if this increased risk is due to the medication or the disease state.  Instructed patient that medication should be held for infection and prior to surgery.  Advised patient to avoid live vaccines. Recommend annual influenza, Pneumovax 23, Prevnar 13, and Shingrix as indicated.  ? ?Reviewed instructions with patient to take methotrexate weekly along with folic acid daily.  Discussed the importance of frequent monitoring of kidney and liver function and blood counts, and provided patient with standing lab instructions.  Counseled patient to avoid NSAIDs and alcohol while on methotrexate. Patient voiced understanidng ? ?Dose of methotrexate will be 70m weekly x 2 weeks then repeat labs. IF stable, increase to 128mweekly thereafter. Patient will also take folic acid 59m30maily. ? ?After review with Dr. MeiVerlee Monteill draw TB gold and Hep B core antibody in 2 weeks ? ?DevKnox Saliva  PharmD, MPH, BCPS ?Clinical Pharmacist (Rheumatology and Pulmonology) ?

## 2021-06-15 NOTE — Telephone Encounter (Signed)
ATC patient to counsel on MTX. Unable to reach - left VM requesting return call ? ?Knox Saliva, PharmD, MPH, BCPS ?Clinical Pharmacist (Rheumatology and Pulmonology) ?

## 2021-06-17 NOTE — Telephone Encounter (Signed)
ATC #2 patient to counsel on MTX. Unable to reach - left VM requesting return call ? ?Knox Saliva, PharmD, MPH, BCPS ?Clinical Pharmacist (Rheumatology and Pulmonology) ?

## 2021-06-20 MED ORDER — METHOTREXATE 2.5 MG PO TABS: ORAL_TABLET | ORAL | 0 refills | Status: DC

## 2021-06-20 MED ORDER — FOLIC ACID 1 MG PO TABS: 1.0000 mg | ORAL_TABLET | Freq: Every day | ORAL | 1 refills | Status: DC

## 2021-06-20 NOTE — Telephone Encounter (Addendum)
Pharmacy Note ? ?Subjective: ?Patient called today by [facility] for MTX counseling. Patient seen by the pharmacist for counseling on methotrexate for pulmonary sarcoidosis. He currently takes prednisone 33m daily ? ?Objective: ?CBC ?   ?Component Value Date/Time  ? WBC 8.3 05/31/2021 1040  ? RBC 4.38 05/31/2021 1040  ? HGB 13.3 05/31/2021 1040  ? HGB 15.5 06/23/2011 1637  ? HCT 40.2 05/31/2021 1040  ? HCT 45.9 06/23/2011 1637  ? PLT 258.0 05/31/2021 1040  ? PLT 218 06/23/2011 1637  ? MCV 91.7 05/31/2021 1040  ? MCV 91.1 06/23/2011 1637  ? MCH 30.4 12/10/2013 1014  ? MCHC 33.1 05/31/2021 1040  ? RDW 14.6 05/31/2021 1040  ? RDW 14.4 06/23/2011 1637  ? LYMPHSABS 2.5 05/31/2021 1040  ? LYMPHSABS 3.0 06/23/2011 1637  ? MONOABS 0.8 05/31/2021 1040  ? MONOABS 0.6 06/23/2011 1637  ? EOSABS 0.3 05/31/2021 1040  ? EOSABS 0.3 06/23/2011 1637  ? BASOSABS 0.1 05/31/2021 1040  ? BASOSABS 0.1 06/23/2011 1637  ? ? ?CMP  ?   ?Component Value Date/Time  ? NA 139 05/31/2021 1040  ? K 3.5 05/31/2021 1040  ? CL 104 05/31/2021 1040  ? CO2 28 05/31/2021 1040  ? GLUCOSE 113 (H) 05/31/2021 1040  ? BUN 18 05/31/2021 1040  ? CREATININE 1.39 05/31/2021 1040  ? CREATININE 1.34 12/10/2013 1014  ? CALCIUM 9.3 05/31/2021 1040  ? PROT 7.1 05/31/2021 1040  ? ALBUMIN 4.3 05/31/2021 1040  ? AST 52 (H) 05/31/2021 1040  ? ALT 26 05/31/2021 1040  ? ALKPHOS 67 05/31/2021 1040  ? BILITOT 0.7 05/31/2021 1040  ? GFRNONAA 86.70 02/15/2010 0756  ? GFRAA  02/20/2009 0336  ?  >60        ?The eGFR has been calculated ?using the MDRD equation. ?This calculation has not been ?validated in all clinical ?situations. ?eGFR's persistently ?<60 mL/min signify ?possible Chronic Kidney Disease.  ? ? ?Baseline Immunosuppressant Therapy Labs ?TB GOLD - order placed to be drawn 2 weeks after starting MTX ?  ?Hepatitis Panel ? ?  Latest Ref Rng & Units 05/31/2021  ? 10:40 AM  ?Hepatitis  ?Hep B Surface Ag NON-REACTIVE NON-REACTIVE    ?Hep C Ab NON-REACTIVE  NON-REACTIVE    ? ?HIV ?Lab Results  ?Component Value Date  ? HIV NONREACTIVE 02/22/2016  ? ?Chest-xray:  06/02/21 - Coarse interstitial markings in the lungs, similar to prior examinations. No new acute findings are noted. ? ?Contraception: wife is post-menopausal ? ?Alcohol use: once or twice a month ? ?Assessment/Plan:  ? ?Patient was counseled on the purpose, proper use, and adverse effects of methotrexate including nausea, infection, and signs and symptoms of pneumonitis. Discussed that there is the possibility of an increased risk of malignancy, specifically lymphomas, but it is not well understood if this increased risk is due to the medication or the disease state.  Instructed patient that medication should be held for infection and prior to surgery.  Advised patient to avoid live vaccines. Recommend annual influenza, Pneumovax 23, Prevnar 13, and Shingrix as indicated.  ? ?Reviewed instructions with patient to take methotrexate weekly along with folic acid daily.  Discussed the importance of frequent monitoring of kidney and liver function and blood counts, and provided patient with standing lab instructions.  Counseled patient to avoid NSAIDs and alcohol while on methotrexate. Patient voiced understanidng ? ?Dose of methotrexate will be 515mweekly x 2 weeks then repeat labs. IF stable, increase to 1070meekly thereafter. Patient will also take folic  acid 31m daily. ? ?He is aware to continue prednisone 518mdaily until advised otherwise by Dr. MeVerlee Monte? ?After review with Dr. MeVerlee Montewill draw TB gold and Hep B core antibody in 2 weeks ? ?DeKnox SalivaPharmD, MPH, BCPS ?Clinical Pharmacist (Rheumatology and Pulmonology) ?

## 2021-06-25 DIAGNOSIS — H5203 Hypermetropia, bilateral: Secondary | ICD-10-CM | POA: Diagnosis not present

## 2021-06-25 DIAGNOSIS — H52209 Unspecified astigmatism, unspecified eye: Secondary | ICD-10-CM | POA: Diagnosis not present

## 2021-06-25 DIAGNOSIS — H524 Presbyopia: Secondary | ICD-10-CM | POA: Diagnosis not present

## 2021-07-05 ENCOUNTER — Other Ambulatory Visit: Payer: Self-pay | Admitting: Emergency Medicine

## 2021-07-07 ENCOUNTER — Telehealth: Payer: Self-pay | Admitting: Pharmacist

## 2021-07-07 DIAGNOSIS — D869 Sarcoidosis, unspecified: Secondary | ICD-10-CM

## 2021-07-07 NOTE — Telephone Encounter (Signed)
ATC patient regarding labs that need to be completed. Unable to reach - left VM detailing lab hours and that no appt is needed. ? ?Lab orders for CMET, CBC, TB gold, and Hep B core antibody already in place ? ?Knox Saliva, PharmD, MPH, BCPS ?Clinical Pharmacist (Rheumatology and Pulmonology) ?

## 2021-07-08 ENCOUNTER — Other Ambulatory Visit (INDEPENDENT_AMBULATORY_CARE_PROVIDER_SITE_OTHER): Payer: Medicare HMO

## 2021-07-08 DIAGNOSIS — Z111 Encounter for screening for respiratory tuberculosis: Secondary | ICD-10-CM | POA: Diagnosis not present

## 2021-07-08 DIAGNOSIS — Z79899 Other long term (current) drug therapy: Secondary | ICD-10-CM

## 2021-07-08 DIAGNOSIS — D869 Sarcoidosis, unspecified: Secondary | ICD-10-CM | POA: Diagnosis not present

## 2021-07-08 LAB — CBC WITH DIFFERENTIAL/PLATELET
Basophils Absolute: 0.1 10*3/uL (ref 0.0–0.1)
Basophils Relative: 0.9 % (ref 0.0–3.0)
Eosinophils Absolute: 0.4 10*3/uL (ref 0.0–0.7)
Eosinophils Relative: 3.9 % (ref 0.0–5.0)
HCT: 37 % — ABNORMAL LOW (ref 39.0–52.0)
Hemoglobin: 12.4 g/dL — ABNORMAL LOW (ref 13.0–17.0)
Lymphocytes Relative: 26.8 % (ref 12.0–46.0)
Lymphs Abs: 2.4 10*3/uL (ref 0.7–4.0)
MCHC: 33.6 g/dL (ref 30.0–36.0)
MCV: 91.4 fl (ref 78.0–100.0)
Monocytes Absolute: 0.9 10*3/uL (ref 0.1–1.0)
Monocytes Relative: 10.1 % (ref 3.0–12.0)
Neutro Abs: 5.2 10*3/uL (ref 1.4–7.7)
Neutrophils Relative %: 58.3 % (ref 43.0–77.0)
Platelets: 248 10*3/uL (ref 150.0–400.0)
RBC: 4.04 Mil/uL — ABNORMAL LOW (ref 4.22–5.81)
RDW: 14.9 % (ref 11.5–15.5)
WBC: 9 10*3/uL (ref 4.0–10.5)

## 2021-07-08 LAB — COMPREHENSIVE METABOLIC PANEL
ALT: 44 U/L (ref 0–53)
AST: 76 U/L — ABNORMAL HIGH (ref 0–37)
Albumin: 4.1 g/dL (ref 3.5–5.2)
Alkaline Phosphatase: 60 U/L (ref 39–117)
BUN: 19 mg/dL (ref 6–23)
CO2: 28 mEq/L (ref 19–32)
Calcium: 9.1 mg/dL (ref 8.4–10.5)
Chloride: 105 mEq/L (ref 96–112)
Creatinine, Ser: 1.38 mg/dL (ref 0.40–1.50)
GFR: 52.33 mL/min — ABNORMAL LOW (ref >60.00)
Glucose, Bld: 87 mg/dL (ref 70–99)
Potassium: 3.8 mEq/L (ref 3.5–5.1)
Sodium: 139 mEq/L (ref 135–145)
Total Bilirubin: 0.7 mg/dL (ref 0.2–1.2)
Total Protein: 6.6 g/dL (ref 6.0–8.3)

## 2021-07-08 NOTE — Telephone Encounter (Signed)
Patient had labs drawn today for methotrexate monitoring. ? ?Knox Saliva, PharmD, MPH, BCPS, CPP ?Clinical Pharmacist (Rheumatology and Pulmonology) ?

## 2021-07-08 NOTE — Telephone Encounter (Signed)
MTX stared 06/26/21. Two doses received. 06/26/21 and 07/03/21 ? ?Current dose is 97m weekly ? ? ?  Latest Ref Rng & Units 07/08/2021  ? 12:02 PM 05/31/2021  ? 10:40 AM 02/24/2021  ?  9:59 AM  ?CMP  ?Glucose 70 - 99 mg/dL 87   113   84    ?BUN 6 - 23 mg/dL 19   18   23     ?Creatinine 0.40 - 1.50 mg/dL 1.38   1.39   1.48    ?Sodium 135 - 145 mEq/L 139   139   138    ?Potassium 3.5 - 5.1 mEq/L 3.8   3.5   3.9    ?Chloride 96 - 112 mEq/L 105   104   103    ?CO2 19 - 32 mEq/L 28   28   29     ?Calcium 8.4 - 10.5 mg/dL 9.1   9.3   9.7    ?Total Protein 6.0 - 8.3 g/dL 6.6   7.1     ?Total Bilirubin 0.2 - 1.2 mg/dL 0.7   0.7     ?Alkaline Phos 39 - 117 U/L 60   67     ?AST 0 - 37 U/L 76   52     ?ALT 0 - 53 U/L 44   26     ? ?Labs show elevation in AST since starting. ALT wnl ? ?CBC showed decrease in Hgb, HCT, and RBC. ? ?DKnox Saliva PharmD, MPH, BCPS, CPP ?Clinical Pharmacist (Rheumatology and Pulmonology) ?

## 2021-07-10 NOTE — Telephone Encounter (Signed)
Thanks for reaching out! I agree let's hold methotrexate. I tried to call on Friday and today to ask if he'd taken NSAIDs or started any other medication that could cause issues but haven't gotten in touch. I've ordered CMP, CBC/diff to be done in two weeks and I'll send him a my chart message about it as well.  ?

## 2021-07-11 LAB — HEPATITIS B CORE ANTIBODY, IGM: Hep B C IgM: NONREACTIVE

## 2021-07-11 LAB — QUANTIFERON-TB GOLD PLUS
Mitogen-NIL: 9.96 IU/mL
NIL: 0.01 IU/mL
QuantiFERON-TB Gold Plus: NEGATIVE
TB1-NIL: 0 IU/mL
TB2-NIL: 0 IU/mL

## 2021-07-11 NOTE — Telephone Encounter (Signed)
Sent My Chart message to patient advising him to hold MTX and recheck labs after he has been off of MTX for 2 weeks. Advised to continue folic acid through Saturday (since he takes MTXon Sundays). Advised him to continue prednisone as prescribed ? ?Knox Saliva, PharmD, MPH, BCPS, CPP ?Clinical Pharmacist (Rheumatology and Pulmonology) ?

## 2021-07-14 NOTE — Telephone Encounter (Signed)
Called patient and made appt to see provider on 07/20/2021 ?

## 2021-07-20 ENCOUNTER — Encounter: Payer: Self-pay | Admitting: Emergency Medicine

## 2021-07-20 ENCOUNTER — Ambulatory Visit: Payer: Medicare HMO | Admitting: Emergency Medicine

## 2021-07-20 VITALS — BP 130/70 | HR 64 | Temp 98.4°F | Ht 67.0 in | Wt 207.5 lb

## 2021-07-20 DIAGNOSIS — N1831 Chronic kidney disease, stage 3a: Secondary | ICD-10-CM

## 2021-07-20 DIAGNOSIS — I10 Essential (primary) hypertension: Secondary | ICD-10-CM

## 2021-07-20 DIAGNOSIS — Z9989 Dependence on other enabling machines and devices: Secondary | ICD-10-CM

## 2021-07-20 DIAGNOSIS — E039 Hypothyroidism, unspecified: Secondary | ICD-10-CM

## 2021-07-20 DIAGNOSIS — G4733 Obstructive sleep apnea (adult) (pediatric): Secondary | ICD-10-CM

## 2021-07-20 DIAGNOSIS — R1013 Epigastric pain: Secondary | ICD-10-CM | POA: Diagnosis not present

## 2021-07-20 DIAGNOSIS — R399 Unspecified symptoms and signs involving the genitourinary system: Secondary | ICD-10-CM | POA: Diagnosis not present

## 2021-07-20 DIAGNOSIS — D869 Sarcoidosis, unspecified: Secondary | ICD-10-CM

## 2021-07-20 MED ORDER — TAMSULOSIN HCL 0.4 MG PO CAPS: 0.4000 mg | ORAL_CAPSULE | Freq: Every day | ORAL | 3 refills | Status: DC

## 2021-07-20 NOTE — Patient Instructions (Signed)
Health Maintenance After Age 69 After age 69, you are at a higher risk for certain long-term diseases and infections as well as injuries from falls. Falls are a major cause of broken bones and head injuries in people who are older than age 69. Getting regular preventive care can help to keep you healthy and well. Preventive care includes getting regular testing and making lifestyle changes as recommended by your health care provider. Talk with your health care provider about: Which screenings and tests you should have. A screening is a test that checks for a disease when you have no symptoms. A diet and exercise plan that is right for you. What should I know about screenings and tests to prevent falls? Screening and testing are the best ways to find a health problem early. Early diagnosis and treatment give you the best chance of managing medical conditions that are common after age 69. Certain conditions and lifestyle choices may make you more likely to have a fall. Your health care provider may recommend: Regular vision checks. Poor vision and conditions such as cataracts can make you more likely to have a fall. If you wear glasses, make sure to get your prescription updated if your vision changes. Medicine review. Work with your health care provider to regularly review all of the medicines you are taking, including over-the-counter medicines. Ask your health care provider about any side effects that may make you more likely to have a fall. Tell your health care provider if any medicines that you take make you feel dizzy or sleepy. Strength and balance checks. Your health care provider may recommend certain tests to check your strength and balance while standing, walking, or changing positions. Foot health exam. Foot pain and numbness, as well as not wearing proper footwear, can make you more likely to have a fall. Screenings, including: Osteoporosis screening. Osteoporosis is a condition that causes  the bones to get weaker and break more easily. Blood pressure screening. Blood pressure changes and medicines to control blood pressure can make you feel dizzy. Depression screening. You may be more likely to have a fall if you have a fear of falling, feel depressed, or feel unable to do activities that you used to do. Alcohol use screening. Using too much alcohol can affect your balance and may make you more likely to have a fall. Follow these instructions at home: Lifestyle Do not drink alcohol if: Your health care provider tells you not to drink. If you drink alcohol: Limit how much you have to: 0-1 drink a day for women. 0-2 drinks a day for men. Know how much alcohol is in your drink. In the [place] one drink equals one 12 oz bottle of beer (355 mL), one 5 oz glass of wine (148 mL), or one 1 oz glass of hard liquor (44 mL). Do not use any products that contain nicotine or tobacco. These products include cigarettes, chewing tobacco, and vaping devices, such as e-cigarettes. If you need help quitting, ask your health care provider. Activity  Follow a regular exercise program to stay fit. This will help you maintain your balance. Ask your health care provider what types of exercise are appropriate for you. If you need a cane or walker, use it as recommended by your health care provider. Wear supportive shoes that have nonskid soles. Safety  Remove any tripping hazards, such as rugs, cords, and clutter. Install safety equipment such as grab bars in bathrooms and safety rails on stairs. Keep rooms and walkways   well-lit. General instructions Talk with your health care provider about your risks for falling. Tell your health care provider if: You fall. Be sure to tell your health care provider about all falls, even ones that seem minor. You feel dizzy, tiredness (fatigue), or off-balance. Take over-the-counter and prescription medicines only as told by your health care provider. These include  supplements. Eat a healthy diet and maintain a healthy weight. A healthy diet includes low-fat dairy products, low-fat (lean) meats, and fiber from whole grains, beans, and lots of fruits and vegetables. Stay current with your vaccines. Schedule regular health, dental, and eye exams. Summary Having a healthy lifestyle and getting preventive care can help to protect your health and wellness after age 69. Screening and testing are the best way to find a health problem early and help you avoid having a fall. Early diagnosis and treatment give you the best chance for managing medical conditions that are more common for people who are older than age 69. Falls are a major cause of broken bones and head injuries in people who are older than age 69. Take precautions to prevent a fall at home. Work with your health care provider to learn what changes you can make to improve your health and wellness and to prevent falls. This information is not intended to replace advice given to you by your health care provider. Make sure you discuss any questions you have with your health care provider. Document Revised: 07/19/2020 Document Reviewed: 07/19/2020 Elsevier Patient Education  2023 [employer] Inc.  

## 2021-07-20 NOTE — Assessment & Plan Note (Signed)
Chronic heartburn with chronic indigestion.  At times has episodes of dysphagia. ?Denies melena or hematemesis.  Occasional nausea. ?Needs GI evaluation and possible upper endoscopy. ?Referral placed today. ?

## 2021-07-20 NOTE — Progress Notes (Signed)
?Philip Woodard ?71 y.o. ? ? ?Chief Complaint  ?Patient presents with  ? Urinary Frequency  ?  Frquency hs decrease some since he stopped taking meethotrexate, no burning or pain during urination   ? Medication Management  ?  Sildenafil no covered by insurance needs alternative   ? ? ?HISTORY OF PRESENT ILLNESS: ?This is a 71 y.o. male with history of hypertension, here for follow-up but also has multiple other complaints as follows: ?1.  Urinary frequency for several months.  Symptoms of overactive bladder ?2.  History of sleep apnea.  Symptoms of periodic movement limb disorder ?3.  Multiple joint pains.  Has appointment with rheumatologist next July.  Was started on methotrexate by cardiologist but developed increased urinary frequency.  Stopped methotrexate.  Still taking daily prednisone 5 mg ?4.  Dyspepsia with some dysphagia for about a year.  Will need referral to GI for upper endoscopy ?5.  Concentration problems ?No other complaints or medical concerns today. ? ?HPI ? ? ?Prior to Admission medications   ?Medication Sig Start Date End Date Taking? Authorizing Provider  ?acetaminophen (TYLENOL) 650 MG CR tablet Take 650 mg by mouth every 8 (eight) hours as needed.   Yes [provider]  ?acyclovir (ZOVIRAX) 800 MG tablet TAKE 1 TABLET BY MOUTH DAILY. 01/20/21  Yes Marrian Salvage, FNP  ?amLODipine (NORVASC) 10 MG tablet TAKE 1 TABLET BY MOUTH EVERY DAY 04/25/21  Yes Marrian Salvage, FNP  ?cholecalciferol (VITAMIN D3) 25 MCG (1000 UT) tablet Take 1,000 Units by mouth daily.   Yes [provider]  ?folic acid (FOLVITE) 1 MG tablet Take 1 tablet (1 mg total) by mouth daily. 06/20/21  Yes Maryjane Hurter, MD  ?Glucosamine-Chondroit-Vit C-Mn (GLUCOSAMINE 1500 COMPLEX) CAPS Take by mouth daily.   Yes [provider]  ?levothyroxine (SYNTHROID) 175 MCG tablet TAKE 1 TABLET BY MOUTH EVERY DAY 07/05/21  Yes Meg Niemeier, Ines Bloomer, MD  ?Multiple Vitamin (MULTIVITAMIN WITH  MINERALS) TABS tablet Take 1 tablet by mouth daily.   Yes [provider]  ?Multiple Vitamin (MULTIVITAMIN) LIQD Take 5 mLs by mouth daily.   Yes [provider]  ?Omega-3 Fatty Acids (FISH OIL) 1000 MG CAPS Take 2 capsules by mouth daily.   Yes [provider]  ?predniSONE (DELTASONE) 5 MG tablet Take 1 tablet (5 mg total) by mouth daily with breakfast. 06/28/21  Yes Maryjane Hurter, MD  ?sildenafil (REVATIO) 20 MG tablet TAKE 5 TABLETS AS DIRECTED 30-45 MINUTES PRIOR TO INTERCOURSE 03/31/21  Yes Orange Hilligoss, Ines Bloomer, MD  ?Turmeric 500 MG TABS Take 1 tablet by mouth 2 (two) times daily.   Yes [provider]  ?valsartan (DIOVAN) 160 MG tablet Take 1 tablet (160 mg total) by mouth daily. 03/31/21  Yes SagardiaInes Bloomer, MD  ?zinc gluconate 50 MG tablet Take 50 mg by mouth daily.   Yes [provider]  ?methotrexate (RHEUMATREX) 2.5 MG tablet Take 66m once weekly x 2 weeks. Repeat labs. Then increase to 134monce weekly thereafter. ?Patient not taking: Reported on 07/20/2021 06/20/21   MeMaryjane HurterMD  ?predniSONE (DELTASONE) 5 MG tablet 4 tablets daily for a week, 3 tablets daily for a week, 2 tablets daily for a week, 1 tablet daily for a week ?Patient not taking: Reported on 07/20/2021 05/31/21   MeMaryjane HurterMD  ? ? ?Allergies  ?Allergen Reactions  ? Other   ?  SEAFOOD  ? Shellfish Allergy Swelling  ?  Tongue and  facial Swelling ?Tongue and facial Swelling  ? ? ?Patient Active Problem List  ? Diagnosis Date Noted  ? Cervical strain 04/27/2021  ? Chronic idiopathic gout involving toe of left foot without tophus 04/27/2021  ? Stage 3a chronic kidney disease ([place]) 03/31/2021  ? Erectile dysfunction due to arterial insufficiency 03/31/2021  ? NASH (nonalcoholic steatohepatitis) 02/22/2015  ? Arthritis of right lower extremity 07/20/2014  ? Hearing loss 02/19/2014  ? OSA on CPAP 05/02/2013  ? Hypothyroid 03/29/2012  ? Essential hypertension 11/24/2008  ?  HYPOTHYROIDISM, POSTSURGICAL 07/07/2008  ? HYPERTROPHY PROSTATE W/UR OBST & OTH LUTS 07/07/2008  ? [place] DISEASE, LUMBAR 12/27/2007  ? Sarcoidosis 06/06/2007  ? GOITER, MULTINODULAR 06/06/2007  ? HEMORRHOIDS 06/06/2007  ? ASTHMA 06/06/2007  ? ? ?Past Medical History:  ?Diagnosis Date  ? Alkaline phosphatase raised 06/28/2011  ? Fluctuating - suspect due to sarcoidosis  ? Allergic rhinitis   ? Allergy   ? Arthritis   ? knees  ? Genital herpes   ? Hemorrhoids   ? Hyperglycemia   ? Hyperplastic lymph node 06/27/2011  ? Submental node excised 1/02; regrowth: re-excision 10/06  ? Hypertension   ? Hypothyroidism   ? Multinodular goiter   ? Routine general medical examination at a health care facility   ? Sarcoidosis   ? Sleep apnea   ? wears cpap   ? ? ?Past Surgical History:  ?Procedure Laterality Date  ? COLONOSCOPY  5-23/2003  ? COLONOSCOPY  02/04/2018  ? TA  ? COLONOSCOPY WITH PROPOFOL  05/26/2021  ? [facility]  ? POLYPECTOMY    ? ROTATOR CUFF REPAIR Bilateral 2005,2007  ? THYROIDECTOMY  2002  ? ? ?Social History  ? ?Socioeconomic History  ? Marital status: Married  ?  Spouse name: Not on file  ? Number of children: Not on file  ? Years of education: Not on file  ? Highest education level: Not on file  ?Occupational History  ? Not on file  ?Tobacco Use  ? Smoking status: Never  ?  Passive exposure: Never  ? Smokeless tobacco: Never  ?Vaping Use  ? Vaping Use: Never used  ?Substance and Sexual Activity  ? Alcohol use: Yes  ?  Comment: 3 times per month  ? Drug use: Never  ? Sexual activity: Not on file  ?Other Topics Concern  ? Not on file  ?Social History Narrative  ? Not on file  ? ?Social Determinants of Health  ? ?Financial Resource Strain: Low Risk   ? Difficulty of Paying Living Expenses: Not hard at all  ?Food Insecurity: No Food Insecurity  ? Worried About Charity fundraiser in the Last Year: Never true  ? Ran Out of Food in the Last Year: Never true  ?Transportation Needs: No Transportation Needs  ?  Lack of Transportation (Medical): No  ? Lack of Transportation (Non-Medical): No  ?Physical Activity: Sufficiently Active  ? Days of Exercise per Week: 3 days  ? Minutes of Exercise per Session: 60 min  ?Stress: No Stress Concern Present  ? Feeling of Stress : Not at all  ?Social Connections: Moderately Integrated  ? Frequency of Communication with Friends and Family: Twice a week  ? Frequency of Social Gatherings with Friends and Family: Twice a week  ? Attends Religious Services: More than 4 times per year  ? Active Member of Clubs or Organizations: No  ? Attends Archivist Meetings: Never  ? Marital Status: Married  ?Intimate Partner Violence: Not  At Risk  ? Fear of Current or Ex-Partner: No  ? Emotionally Abused: No  ? Physically Abused: No  ? Sexually Abused: No  ? ? ?Family History  ?Problem Relation Age of Onset  ? Sleep apnea Brother   ? Sleep apnea Paternal Aunt   ? Asthma Cousin   ? Sarcoidosis Neg Hx   ? Colon polyps Neg Hx   ? Colon cancer Neg Hx   ? Esophageal cancer Neg Hx   ? Stomach cancer Neg Hx   ? Rectal cancer Neg Hx   ? ? ? ?Review of Systems  ?Constitutional: Negative.  Negative for chills and fever.  ?HENT: Negative.    ?Cardiovascular: Negative.  Negative for chest pain and palpitations.  ?Gastrointestinal:  Negative for abdominal pain, nausea and vomiting.  ?Genitourinary:  Positive for frequency and urgency. Negative for hematuria.  ?Skin: Negative.  Negative for rash.  ?Neurological: Negative.  Negative for dizziness and headaches.  ?All other systems reviewed and are negative. ?Today's Vitals  ? 07/20/21 0830  ?BP: 140/72  ?Pulse: 64  ?Temp: 98.4 ?F (36.9 ?C)  ?TempSrc: Oral  ?SpO2: 95%  ?Weight: 207 lb 8 oz (94.1 kg)  ?Height: 5' 7"  (1.702 m)  ? ?Body mass index is 32.5 kg/m?. ? ? ?Physical Exam ?Vitals reviewed.  ?Constitutional:   ?   Appearance: Normal appearance.  ?HENT:  ?   Head: Normocephalic.  ?   Mouth/Throat:  ?   Mouth: Mucous membranes are moist.  ?   Pharynx:  Oropharynx is clear.  ?Eyes:  ?   Extraocular Movements: Extraocular movements intact.  ?   Conjunctiva/sclera: Conjunctivae normal.  ?   Pupils: Pupils are equal, round, and reactive to light.  ?Cardiovascular:

## 2021-07-20 NOTE — Assessment & Plan Note (Addendum)
Sarcoidosis: ?With likely sarcoid arthropathy. Has mild obstruction on PFT but not symptomatic from respiratory standpoint.  ?Recent visit with pulmonary medicine Dr., Plan as follows: ?Plan: ?- given high likelihood of sarcoid arthropathy and inability to take NSAID in setting CKD, will plan to start methotrexate if baseline labs look ok. Will give a month long low dose taper of prednisone as methotrexate is started ?- referral has been placed by [facility] for rheum but could not get appt until 09/2021 ?- spirometry in 6 months ?- EKG due 02/2021 ?- has seen eye doctor this year ?- TLC measures including continued adherence to CPAP discussed to work on GERD which he'll try to implement, if failing he'll let us know so we can prescribe ppi ?Did not tolerate methotrexate.  Still on low-dose prednisone. ?

## 2021-07-20 NOTE — Assessment & Plan Note (Signed)
Well-controlled hypertension with normal blood pressure readings at home. ?Continue valsartan 160 mg and amlodipine 10 mg daily. ?Dietary approaches to control hypertension discussed. ?Cardiovascular risks associated with hypertension discussed. ?

## 2021-07-20 NOTE — Assessment & Plan Note (Signed)
Clinically euthyroid.  Continue Synthroid 175 mcg daily. ? ?

## 2021-07-20 NOTE — Assessment & Plan Note (Signed)
Advised to stay well-hydrated and avoid NSAIDs. ?

## 2021-07-20 NOTE — Assessment & Plan Note (Signed)
Overactive bladder symptoms probably related to bladder outlet obstruction secondary to prostate enlargement.  May benefit from Flomax 0.4 mg at bedtime. ?May need urology evaluation in the future. ?

## 2021-07-20 NOTE — Assessment & Plan Note (Signed)
Compliant with treatment. ?Describes what sounds like periodic movement limb disorder activity during his sleep. ?

## 2021-07-23 ENCOUNTER — Other Ambulatory Visit: Payer: Self-pay | Admitting: Family

## 2021-07-25 ENCOUNTER — Other Ambulatory Visit (INDEPENDENT_AMBULATORY_CARE_PROVIDER_SITE_OTHER): Payer: Medicare HMO

## 2021-07-25 DIAGNOSIS — D869 Sarcoidosis, unspecified: Secondary | ICD-10-CM | POA: Diagnosis not present

## 2021-07-25 LAB — CBC WITH DIFFERENTIAL/PLATELET
Basophils Absolute: 0.1 10*3/uL (ref 0.0–0.1)
Basophils Relative: 0.9 % (ref 0.0–3.0)
Eosinophils Absolute: 0.2 10*3/uL (ref 0.0–0.7)
Eosinophils Relative: 2.1 % (ref 0.0–5.0)
HCT: 37.8 % — ABNORMAL LOW (ref 39.0–52.0)
Hemoglobin: 12.6 g/dL — ABNORMAL LOW (ref 13.0–17.0)
Lymphocytes Relative: 22 % (ref 12.0–46.0)
Lymphs Abs: 1.9 10*3/uL (ref 0.7–4.0)
MCHC: 33.3 g/dL (ref 30.0–36.0)
MCV: 92 fl (ref 78.0–100.0)
Monocytes Absolute: 0.7 10*3/uL (ref 0.1–1.0)
Monocytes Relative: 8.8 % (ref 3.0–12.0)
Neutro Abs: 5.6 10*3/uL (ref 1.4–7.7)
Neutrophils Relative %: 66.2 % (ref 43.0–77.0)
Platelets: 266 10*3/uL (ref 150.0–400.0)
RBC: 4.11 Mil/uL — ABNORMAL LOW (ref 4.22–5.81)
RDW: 15 % (ref 11.5–15.5)
WBC: 8.5 10*3/uL (ref 4.0–10.5)

## 2021-07-25 LAB — COMPREHENSIVE METABOLIC PANEL
ALT: 25 U/L (ref 0–53)
AST: 39 U/L — ABNORMAL HIGH (ref 0–37)
Albumin: 4.3 g/dL (ref 3.5–5.2)
Alkaline Phosphatase: 62 U/L (ref 39–117)
BUN: 22 mg/dL (ref 6–23)
CO2: 26 mEq/L (ref 19–32)
Calcium: 9.3 mg/dL (ref 8.4–10.5)
Chloride: 105 mEq/L (ref 96–112)
Creatinine, Ser: 1.27 mg/dL (ref 0.40–1.50)
GFR: 57.8 mL/min — ABNORMAL LOW (ref >60.00)
Glucose, Bld: 95 mg/dL (ref 70–99)
Potassium: 3.9 mEq/L (ref 3.5–5.1)
Sodium: 139 mEq/L (ref 135–145)
Total Bilirubin: 0.7 mg/dL (ref 0.2–1.2)
Total Protein: 7 g/dL (ref 6.0–8.3)

## 2021-07-25 NOTE — Telephone Encounter (Signed)
Rx sent to pt new PCP ?

## 2021-07-26 ENCOUNTER — Other Ambulatory Visit: Payer: Self-pay | Admitting: Student

## 2021-07-27 NOTE — Telephone Encounter (Signed)
LFTs significantly improved after holding MTX for 2 weeks. Likely MTX-induced elevation.  Patient confirmed no change in meds, NSAID use, or alcohol use. ? ? ?  Latest Ref Rng & Units 07/25/2021  ?  2:42 PM 07/08/2021  ? 12:02 PM 05/31/2021  ? 10:40 AM  ?CMP  ?Glucose 70 - 99 mg/dL 95   87   113    ?BUN 6 - 23 mg/dL 22   19   18     ?Creatinine 0.40 - 1.50 mg/dL 1.27   1.38   1.39    ?Sodium 135 - 145 mEq/L 139   139   139    ?Potassium 3.5 - 5.1 mEq/L 3.9   3.8   3.5    ?Chloride 96 - 112 mEq/L 105   105   104    ?CO2 19 - 32 mEq/L 26   28   28     ?Calcium 8.4 - 10.5 mg/dL 9.3   9.1   9.3    ?Total Protein 6.0 - 8.3 g/dL 7.0   6.6   7.1    ?Total Bilirubin 0.2 - 1.2 mg/dL 0.7   0.7   0.7    ?Alkaline Phos 39 - 117 U/L 62   60   67    ?AST 0 - 37 U/L 39   76   52    ?ALT 0 - 53 U/L 25   44   26    ? ?F/u is scheduled for 08/31/21. Routing to Dr. Verlee Monte for f/u r/s. Patient has been advised to continue prednisone 96m daily for now. ? ?DKnox Saliva PharmD, MPH, BCPS, CPP ?Clinical Pharmacist (Rheumatology and Pulmonology) ?

## 2021-07-27 NOTE — Telephone Encounter (Signed)
Thanks for all the help with this! I'll see if we can bring him back sooner to discuss options.  ?

## 2021-07-27 NOTE — Telephone Encounter (Signed)
Called and spoke with pt and have scheduled him for an appt with NM tomorrow 5/18. Nothing further needed. ?

## 2021-07-28 ENCOUNTER — Ambulatory Visit: Payer: Medicare HMO | Admitting: Student

## 2021-07-28 ENCOUNTER — Encounter: Payer: Self-pay | Admitting: Student

## 2021-07-28 VITALS — BP 134/74 | HR 82 | Temp 98.3°F | Ht 67.0 in | Wt 207.0 lb

## 2021-07-28 DIAGNOSIS — Z79899 Other long term (current) drug therapy: Secondary | ICD-10-CM

## 2021-07-28 DIAGNOSIS — D869 Sarcoidosis, unspecified: Secondary | ICD-10-CM | POA: Diagnosis not present

## 2021-07-28 MED ORDER — HYDROXYCHLOROQUINE SULFATE 200 MG PO TABS: 400.0000 mg | ORAL_TABLET | Freq: Every day | ORAL | 4 refills | Status: DC

## 2021-07-28 NOTE — Progress Notes (Signed)
Synopsis: Referred for sarcoid by Horald Pollen, *  Subjective:   PATIENT ID: Philip Woodard GENDER: male DOB: August 15, 1950, MRN: [id number]  Chief Complaint  Patient presents with   Follow-up    Dry cough- relates to allergies. He states his breathing is overall doing well. He is on pred 5 mg daily.    71yM with history of sarcoid (TBLB with [place] granuloma and neg AFB/fungal stains in 2002), OSA on CPAP, CKD3a, multinodular goiter, rhinitis  Regarding his sarcoidosis he was only only prednisone for 3 months after diagnosis in 2002.   He has no trouble breathing, including with exertion, no cough. No CP, vision changes including no photophobia, palpitations.   Some indigestion 2-3x per week. Usually after eating. Especially at night, can frequently have sour taste in his mouth. Takes tums with some improvement. He thinks he was on ppi at some point in the past - didn't want to remain on it indefinitely.  Takes claritin consistently, flonase intermittently for rhinitis. He thinks claritin is more helpful though.   Two aunts on father's side probably had sarcoid as well  He works as a Landscape architect. He has never lived outside of [place]. Went to [facility] for thanksgiving. No pets at home. Never smoker.   Interval HPI: Overall improvement in neck pain/shoulder pain with morning stiffness since last visit but starting to note a little tightness there. Still on prednisone 5 mg daily.   Some minor increase in DOE as he walks up stairs.   Otherwise pertinent review of systems is negative.    Past Medical History:  Diagnosis Date   Alkaline phosphatase raised 06/28/2011   Fluctuating - suspect due to sarcoidosis   Allergic rhinitis    Allergy    Arthritis    knees   Genital herpes    Hemorrhoids    Hyperglycemia    Hyperplastic lymph node 06/27/2011   Submental node excised 1/02; regrowth: re-excision 10/06   Hypertension    Hypothyroidism    Multinodular goiter     Routine general medical examination at a health care facility    Sarcoidosis    Sleep apnea    wears cpap      Family History  Problem Relation Age of Onset   Sleep apnea Brother    Sleep apnea Paternal Aunt    Asthma Cousin    Sarcoidosis Neg Hx    Colon polyps Neg Hx    Colon cancer Neg Hx    Esophageal cancer Neg Hx    Stomach cancer Neg Hx    Rectal cancer Neg Hx      Past Surgical History:  Procedure Laterality Date   COLONOSCOPY  5-23/2003   COLONOSCOPY  02/04/2018   TA   COLONOSCOPY WITH PROPOFOL  05/26/2021   [facility] [phone]   THYROIDECTOMY  2002    Social History   Socioeconomic History   Marital status: Married    Spouse name: Not on file   Number of children: Not on file   Years of education: Not on file   Highest education level: Not on file  Occupational History   Not on file  Tobacco Use   Smoking status: Never    Passive exposure: Never   Smokeless tobacco: Never  Vaping Use   Vaping Use: Never used  Substance and Sexual Activity   Alcohol use: Yes    Comment: 3 times per  month   Drug use: Never   Sexual activity: Not on file  Other Topics Concern   Not on file  Social History Narrative   Not on file   Social Determinants of Health   Financial Resource Strain: Low Risk    Difficulty of Paying Living Expenses: Not hard at all  Food Insecurity: No Food Insecurity   Worried About [place] in the Last Year: Never true   [place] in the Last Year: Never true  Transportation Needs: No Transportation Needs   Lack of Transportation (Medical): No   Lack of Transportation (Non-Medical): No  Physical Activity: Sufficiently Active   Days of Exercise per Week: 3 days   Minutes of Exercise per Session: 60 min  Stress: No Stress Concern Present   Feeling of Stress : Not at all  Social Connections: Moderately Integrated   Frequency of Communication with Friends and  Family: Twice a week   Frequency of Social Gatherings with Friends and Family: Twice a week   Attends Religious Services: More than 4 times per year   Active Member of [employer] or Organizations: No   Attends Music therapist: Never   Marital Status: Married  Human resources officer Violence: Not At Risk   Fear of Current or Ex-Partner: No   Emotionally Abused: No   Physically Abused: No   Sexually Abused: No     Allergies  Allergen Reactions   Other     SEAFOOD   Shellfish Allergy Swelling    Tongue and facial Swelling Tongue and facial Swelling     Outpatient Medications Prior to Visit  Medication Sig Dispense Refill   acetaminophen (TYLENOL) 650 MG CR tablet Take 650 mg by mouth every 8 (eight) hours as needed.     acyclovir (ZOVIRAX) 800 MG tablet TAKE 1 TABLET BY MOUTH DAILY. 90 tablet 0   amLODipine (NORVASC) 10 MG tablet TAKE 1 TABLET BY MOUTH EVERY DAY 90 tablet 0   cholecalciferol (VITAMIN D3) 25 MCG (1000 UT) tablet Take 1,000 Units by mouth daily.     Glucosamine-Chondroit-Vit C-Mn (GLUCOSAMINE 1500 COMPLEX) CAPS Take by mouth daily.     levothyroxine (SYNTHROID) 175 MCG tablet TAKE 1 TABLET BY MOUTH EVERY DAY 90 tablet 0   Multiple Vitamin (MULTIVITAMIN WITH MINERALS) TABS tablet Take 1 tablet by mouth daily.     Multiple Vitamin (MULTIVITAMIN) LIQD Take 5 mLs by mouth daily.     Omega-3 Fatty Acids (FISH OIL) 1000 MG CAPS Take 2 capsules by mouth daily.     predniSONE (DELTASONE) 5 MG tablet TAKE 1 TABLET BY MOUTH EVERY DAY WITH BREAKFAST 30 tablet 3   sildenafil (REVATIO) 20 MG tablet TAKE 5 TABLETS AS DIRECTED 30-45 MINUTES PRIOR TO INTERCOURSE 50 tablet 5   tamsulosin (FLOMAX) 0.4 MG CAPS capsule Take 1 capsule (0.4 mg total) by mouth daily. 30 capsule 3   Turmeric 500 MG TABS Take 1 tablet by mouth 2 (two) times daily.     valsartan (DIOVAN) 160 MG tablet Take 1 tablet (160 mg total) by mouth daily. 90 tablet 3   zinc gluconate 50 MG tablet Take 50 mg by mouth  daily.     folic acid (FOLVITE) 1 MG tablet Take 1 tablet (1 mg total) by mouth daily. (Patient not taking: Reported on 07/28/2021) 90 tablet 1   methotrexate (RHEUMATREX) 2.5 MG tablet Take 62m once weekly x 2 weeks. Repeat labs. Then increase to 163monce weekly thereafter. (Patient not  taking: Reported on 07/28/2021) 12 tablet 0   predniSONE (DELTASONE) 5 MG tablet 4 tablets daily for a week, 3 tablets daily for a week, 2 tablets daily for a week, 1 tablet daily for a week 70 tablet 0   No facility-administered medications prior to visit.       Objective:   Physical Exam:  General appearance: 71 y.o., male, NAD, conversant  Eyes: anicteric sclerae; PERRL, tracking appropriately HENT: NCAT; MMM Neck: Trachea midline; no lymphadenopathy, no JVD Lungs: CTAB, no crackles, no wheeze, with normal respiratory effort CV: RRR, no murmur  Abdomen: Soft, non-tender; non-distended, BS present  Extremities: No peripheral edema, warm Skin: Normal turgor and texture; no rash Psych: Appropriate affect Neuro: Alert and oriented to person and place, no focal deficit     Vitals:   07/28/21 1138  Pulse: 82  SpO2: 97%    97% on RA BMI Readings from Last 3 Encounters:  07/20/21 32.50 kg/m  05/31/21 31.48 kg/m  05/26/21 31.17 kg/m   Wt Readings from Last 3 Encounters:  07/20/21 207 lb 8 oz (94.1 kg)  05/31/21 204 lb (92.5 kg)  05/26/21 199 lb (90.3 kg)     CBC    Component Value Date/Time   WBC 8.5 07/25/2021 1442   RBC 4.11 (L) 07/25/2021 1442   HGB 12.6 (L) 07/25/2021 1442   HGB 15.5 06/23/2011 1637   HCT 37.8 (L) 07/25/2021 1442   HCT 45.9 06/23/2011 1637   PLT 266.0 07/25/2021 1442   PLT 218 06/23/2011 1637   MCV 92.0 07/25/2021 1442   MCV 91.1 06/23/2011 1637   MCH 30.4 12/10/2013 1014   MCHC 33.3 07/25/2021 1442   RDW 15.0 07/25/2021 1442   RDW 14.4 06/23/2011 1637   LYMPHSABS 1.9 07/25/2021 1442   LYMPHSABS 3.0 06/23/2011 1637   MONOABS 0.7 07/25/2021 1442    MONOABS 0.6 06/23/2011 1637   EOSABS 0.2 07/25/2021 1442   EOSABS 0.3 06/23/2011 1637   BASOSABS 0.1 07/25/2021 1442   BASOSABS 0.1 06/23/2011 1637    Chest Imaging: CXR 2018 reviewed by me with prominent hila, interstitium  CXR 05/31/21 reviewed by me with slightly more prominent interstitium   Echocardiogram:   Stress echo 2018:  NORMAL STRESS TEST. NORMAL RESTING STUDY WITH NO WALL MOTION ABNORMALITIES   AT REST AND PEAK STRESS.   NO DOPPLER PERFORMED FOR VALVULAR REGURGITATION   NO DOPPLER PERFORMED FOR VALVULAR STENOSIS   Note: BASELINE PROTOCOL   PATIENT WENT INTO SECOND DEGREE HEART BLOCK WHEN ENTERING RECOVERY FOLLOWED   BY A SHORT RUN OF JUNCTIONAL TACH. NO SYMPTOMS REPORTED   TTE 2018:   NORMAL LEFT VENTRICULAR SYSTOLIC FUNCTION WITH MILD LVH    NORMAL RIGHT VENTRICULAR SYSTOLIC FUNCTION    VALVULAR REGURGITATION: MILD AR, TRIVIAL MR, MILD PR, TRIVIAL TR    NO VALVULAR STENOSIS        Assessment & Plan:   # Sarcoidosis: With likely sarcoid arthropathy and good response to prednisone. Has mild obstruction on PFT but not symptomatic from respiratory standpoint. Developed possible MTX-related transaminitis, medication discontinued.    # GERD: Some improvement with lifestyle measures  # OSA on CPAP: Adherent.  # Rhinitis:  Best symptomatic response to antihistamine.  Plan: - continue prednisone 5 mg daily - start plaquenil 400 mg daily - counseled that he needs eye exam within a year of starting plaquenil - referral has been placed by sports med for rheum but could not get appt until 09/2021 - spirometry next visit -  EKG due 02/2021 - has seen eye doctor this year - TLC measures including continued adherence to CPAP discussed to work on GERD which he'll try to implement, if failing he'll let us know so we can prescribe ppi    RTC 3 months    Maryjane Hurter, MD [facility] 07/28/2021 11:40 AM

## 2021-07-28 NOTE — Patient Instructions (Addendum)
-   Start taking plaquenil 400 mg (2 tablets) daily - Need to see ophthalmology this year now that we're starting plaquenil. If you notice certain letters dropping out from words while reading, photophobia, blurred distance vision, reduced night vision, visual field defects, and flashing lights please let us know - this could be a sign of toxicity from plaquenil - stay on prednisone 5 mg daily at least until you see rheumatology - let me know if any worsening dyspnea, cough - see you in 3 months!

## 2021-08-15 ENCOUNTER — Other Ambulatory Visit: Payer: Self-pay | Admitting: Emergency Medicine

## 2021-08-15 ENCOUNTER — Encounter: Payer: Self-pay | Admitting: Emergency Medicine

## 2021-08-15 NOTE — Telephone Encounter (Signed)
How is he taking this medication, daily or several times a day?

## 2021-08-31 ENCOUNTER — Ambulatory Visit: Payer: Medicare HMO | Admitting: Student

## 2021-09-06 ENCOUNTER — Other Ambulatory Visit: Payer: Self-pay | Admitting: Family

## 2021-09-07 MED ORDER — ACYCLOVIR 800 MG PO TABS: 800.0000 mg | ORAL_TABLET | Freq: Every day | ORAL | 0 refills | Status: DC

## 2021-09-14 ENCOUNTER — Encounter: Payer: Self-pay | Admitting: Student

## 2021-09-16 ENCOUNTER — Other Ambulatory Visit: Payer: Self-pay | Admitting: Student

## 2021-09-16 DIAGNOSIS — Z79899 Other long term (current) drug therapy: Secondary | ICD-10-CM

## 2021-09-16 DIAGNOSIS — D869 Sarcoidosis, unspecified: Secondary | ICD-10-CM

## 2021-09-16 NOTE — Progress Notes (Signed)
Office Visit Note  Patient: Philip Woodard             Date of Birth: 09/19/1950           MRN: [id number]             PCP: Horald Pollen, MD Referring: Rosemarie Ax, MD Visit Date: 09/30/2021 Occupation: _0 @  Subjective:  Muscle pain and fatigue  History of Present Illness: Philip Woodard is a 71 y.o. male seen in consultation per request of Dr. Raeford Razor.  According to the patient in 2001 he started having shortness of breath.  At the time he was evaluated by Dr. Melvyn Novas and was given the diagnosis of sarcoidosis based on lymph node biopsy.  He states he was on prednisone for few months and then came off prednisone without any recurrence of symptoms.  He states asymptomatic as regards to sarcoidosis.  There was no history of joint pain or rash.  3 years ago he developed right great toe pain which she thought was gout based on the symptoms.  He states he did some dietary modifications and had 2 or 3 more episodes with no recurrence of gout.  He has been off red meat since then.  He is allergic to seafood.  He drinks alcohol 2-3 times a month.  He does not recall that his uric acid was high or he was given an official diagnosis of gout.  He had seen Dr. Raeford Razor over the last few years for [place] fasciitis and Achilles tendinitis and ankle pain.  He is experience some lower back and neck pain.  He had x-rays in the past of his lumbar spine which was consistent with degenerative disc disease.  He also had some recent x-rays of his cervical spine which were consistent with degenerative disease of the cervical spine.  He states 3 months ago he started having increased pain in the morning and in the evening.  In the morning it was hard for him to get out the bed due to pain.  The pain was in all the joints in the muscles.  He was evaluated by Dr. Raeford Razor at the time who suspected sarcoidosis and referred him to Dr. Doyle Askew.  Dr. Doyle Askew suspected sarcoid arthropathy and started him on  prednisone and methotrexate.  The methotrexate caused elevated LFTs and was discontinued.  He started him on hydroxychloroquine along with prednisone.  He noticed improvement in his symptoms on prednisone.  He was also having discomfort in his left eye which she describes as a strain on rotating his eye.  He was seen by Dr. Valetta Close who did not find any abnormalities on the clinical examination.  He also had normal PFTs.  He states that currently he only has some stiffness in his cervical spine.  None of the joints are painful.  He does not experiencing increased muscle pain.  Activities of Daily Living:  Patient reports morning stiffness for 0 minutes.   Patient Denies nocturnal pain.  Difficulty dressing/grooming: Denies Difficulty climbing stairs: Denies Difficulty getting out of chair: Denies Difficulty using hands for taps, buttons, cutlery, and/or writing: Denies  Review of Systems  Constitutional:  Negative for fatigue.  HENT:  Negative for mouth sores and mouth dryness.   Eyes:  Negative for dryness.  Respiratory:  Negative for shortness of breath.   Cardiovascular:  Negative for chest pain and palpitations.  Gastrointestinal:  Negative for blood in stool, constipation and diarrhea.  Endocrine: Negative for increased urination.  Genitourinary:  Negative for involuntary urination.  Musculoskeletal:  Positive for myalgias and myalgias. Negative for joint pain, gait problem, joint pain, joint swelling, muscle weakness, morning stiffness and muscle tenderness.  Skin:  Positive for sensitivity to sunlight. Negative for color change, rash and hair loss.  Allergic/Immunologic: Negative for susceptible to infections.  Neurological:  Negative for dizziness and headaches.  Hematological:  Negative for swollen glands.  Psychiatric/Behavioral:  Negative for depressed mood and sleep disturbance. The patient is not nervous/anxious.     PMFS History:  Patient Active Problem List   Diagnosis Date  Noted   Dyspepsia 07/20/2021   Lower urinary tract symptoms 07/20/2021   Cervical strain 04/27/2021   Chronic idiopathic gout involving toe of left foot without tophus 04/27/2021   Stage 3a chronic kidney disease ([place]) 03/31/2021   Erectile dysfunction due to arterial insufficiency 03/31/2021   NASH (nonalcoholic steatohepatitis) 02/22/2015   Arthritis of right lower extremity 07/20/2014   Hearing loss 02/19/2014   OSA on CPAP 05/02/2013   Hypothyroid 03/29/2012   Primary hypertension 11/24/2008   HYPOTHYROIDISM, POSTSURGICAL 07/07/2008   HYPERTROPHY PROSTATE W/UR OBST & OTH LUTS 07/07/2008   [place] DISEASE, LUMBAR 12/27/2007   Sarcoidosis 06/06/2007   GOITER, MULTINODULAR 06/06/2007   HEMORRHOIDS 06/06/2007   ASTHMA 06/06/2007    Past Medical History:  Diagnosis Date   Alkaline phosphatase raised 06/28/2011   Fluctuating - suspect due to sarcoidosis   Allergic rhinitis    Allergy    Arthritis    knees   Genital herpes    Hemorrhoids    Hyperglycemia    Hyperplastic lymph node 06/27/2011   Submental node excised 1/02; regrowth: re-excision 10/06   Hypertension    Hypothyroidism    Multinodular goiter    Routine general medical examination at a health care facility    Sarcoidosis    Sleep apnea    wears cpap     Family History  Problem Relation Age of Onset   Hypertension Mother    Dementia Father    Sleep apnea Brother    Healthy Brother    Sleep apnea Paternal Aunt    Asthma Cousin    Healthy Daughter    Healthy Daughter    Sarcoidosis Neg Hx    Colon polyps Neg Hx    Colon cancer Neg Hx    Esophageal cancer Neg Hx    Stomach cancer Neg Hx    Rectal cancer Neg Hx    Past Surgical History:  Procedure Laterality Date   COLONOSCOPY  5-23/2003   COLONOSCOPY  02/04/2018   TA   COLONOSCOPY WITH PROPOFOL  05/26/2021   [facility] at [place] Bilateral [id number]   THYROIDECTOMY  2002   Social History   Social History  Narrative   Not on file   Immunization History  Administered Date(s) Administered   Fluad Quad(high Dose 65+) 12/11/2018, 01/20/2020, 03/31/2021   Influenza Whole 12/11/2009, 12/11/2011   Influenza, High Dose Seasonal PF 11/14/2017   Influenza,inj,Quad PF,6+ Mos 01/31/2013, 12/10/2013, 02/22/2016, 12/19/2016   Moderna Sars-Covid-2 Vaccination 03/18/2020   PFIZER(Purple Top)SARS-COV-2 Vaccination 04/04/2019, 04/25/2019   Pneumococcal Conjugate-13 11/14/2017   Pneumococcal Polysaccharide-23 01/31/2013   Td 10/23/2005   Tdap 02/22/2016   Zoster, Live 01/31/2013     Objective: Vital Signs: BP 134/72 (BP Location: Right Arm, Patient Position: Sitting, Cuff Size: Normal)   Pulse 66   Ht _0  (1.702 m)   Wt 201 lb 6.4  oz (91.4 kg)   BMI 31.54 kg/m    Physical Exam Vitals and nursing note reviewed.  Constitutional:      Appearance: He is well-developed.  HENT:     Head: Normocephalic and atraumatic.  Eyes:     Conjunctiva/sclera: Conjunctivae normal.     Pupils: Pupils are equal, round, and reactive to light.  Cardiovascular:     Rate and Rhythm: Normal rate and regular rhythm.     Heart sounds: Normal heart sounds.  Pulmonary:     Effort: Pulmonary effort is normal.     Breath sounds: Normal breath sounds.  Abdominal:     General: Bowel sounds are normal.     Palpations: Abdomen is soft.  Musculoskeletal:     Cervical back: Normal range of motion and neck supple.  Skin:    General: Skin is warm and dry.     Capillary Refill: Capillary refill takes less than 2 seconds.  Neurological:     Mental Status: He is alert and oriented to person, place, and time.  Psychiatric:        Behavior: Behavior normal.      Musculoskeletal Exam: C-spine thoracic and lumbar spine were in good range of motion.  Shoulder joints, elbow joints, wrist joints, MCPs PIPs and DIPs with good range of motion with no synovitis.  Hip joints, knee joints, ankles, MTPs and PIPs with good range of  motion with no synovitis.  CDAI Exam: CDAI Score: -- Patient Global: --; Provider Global: -- Swollen: 0 ; Tender: 0  Joint Exam 09/30/2021   No joint exam has been documented for this visit   There is currently no information documented on the homunculus. Go to the Rheumatology activity and complete the homunculus joint exam.  Investigation: No additional findings.  Imaging: No results found.  Recent Labs: Lab Results  Component Value Date   WBC 8.5 07/25/2021   HGB 12.6 (L) 07/25/2021   PLT 266.0 07/25/2021   NA 139 07/25/2021   K 3.9 07/25/2021   CL 105 07/25/2021   CO2 26 07/25/2021   GLUCOSE 95 07/25/2021   BUN 22 07/25/2021   CREATININE 1.27 07/25/2021   BILITOT 0.7 07/25/2021   ALKPHOS 62 07/25/2021   AST 39 (H) 07/25/2021   ALT 25 07/25/2021   PROT 7.0 07/25/2021   ALBUMIN 4.3 07/25/2021   CALCIUM 9.3 07/25/2021   GFRAA  02/20/2009    >60        The eGFR has been calculated using the MDRD equation. This calculation has not been validated in all clinical situations. eGFR's persistently <60 mL/min signify possible Chronic Kidney Disease.   QFTBGOLDPLUS NEGATIVE 07/08/2021    Speciality Comments: No specialty comments available.  Procedures:  No procedures performed Allergies: Other and Shellfish allergy   Assessment / Plan:     Visit Diagnoses: Myalgia-patient gives history of sudden onset myalgias in February 2023.  He states the pain was so severe that he was having difficulty getting out of the bed.  He does not recall difficulty getting out of the chair or getting off the toilet seat.  He states the symptoms eased off during the day and then recurred towards the evening.  He had an excellent response to prednisone.  His labs obtained at that time showed normal sedimentation rate and elevated C-reactive protein.  He has had no recurrence of symptoms since he has been on prednisone 5 mg p.o. daily.  There is no history of temporal headaches.  He had  some left eye ocular strain for which she was seen by the ophthalmologist and the eye examination was normal.  He had no temporal artery tenderness on my examination today.  No muscular weakness or tenderness was noted.  I will obtain labs today.  If his labs are normal I may start tapering prednisone by 1 mg every month.  I am still uncertain about the true etiology of his symptoms.  Other fatigue -he had increased fatigue during the time of episode which is resolved now.  He has been going to the gym 3 times a week without any trouble.  I will obtain labs today.  Plan: CBC with Differential/Platelet, COMPLETE METABOLIC PANEL WITH GFR, Sedimentation rate, C-reactive protein, CK, TSH  High risk medication use-patient was a started on methotrexate by the pulmonologist at the onset of the illness with the suspicion of sarcoidosis flare.  His chest x-ray was unremarkable.  Methotrexate was discontinued due to elevation in LFTs.  Plaquenil was started on Jul 28, 2021 at 200 mg p.o. twice daily.  He has been taking Plaquenil on a regular basis along with prednisone 5 mg p.o. daily.  Patient states he had eye examination by the ophthalmologist a week ago.  We will get records.  Detailed counsel regarding hydroxychloroquine was provided.  Indications side effects contraindications were discussed.  A handout was given and consent was obtained.  We will check labs today and then every 5 months if stable.  He will need annual eye examination.  Patient was counseled on the purpose, proper use, and adverse effects of hydroxychloroquine including nausea/diarrhea, skin rash, headaches, and sun sensitivity.  Advised patient to wear sunscreen once starting hydroxychloroquine to reduce risk of rash associated with sun sensitivity.  Discussed importance of annual eye exams while on hydroxychloroquine to monitor to ocular toxicity and discussed importance of frequent laboratory monitoring.  Provided patient with eye exam form  for baseline ophthalmologic exam.  Reviewed risk for QTC prolongation when used in combination with other QTc prolonging agents (including but not limited to antiarrhythmics, macrolide antibiotics, flouroquinolones, tricyclic antidepressants, citalopram, specific antipsychotics, ondansetron, migraine triptans, and methadone). Provided patient with educational materials on hydroxychloroquine and answered all questions.  Patient consented to hydroxychloroquine. Will upload consent in the media tab.     Sarcoidosis -he was diagnosed with sarcoidosis in 2001.  According to the patient at the time lymph node biopsy was positive.  He took prednisone for 3 months and then symptoms resolved without any recurrence.  His recent chest x-ray was unremarkable.  He denies any history of rash or joint swelling.  04/27/21: ANA negative, RF 11.8, anti-CCP 2, ESR 17, CRP 60, uric acid 3.9 - Plan: Angiotensin converting enzyme  Chronic idiopathic gout involving toe of left foot without tophus -patient self diagnosed himself with gout with intermittent discomfort in his right great toe by consuming beef and pork.  He has been allergic to seafood.  He drinks alcohol 2-3 times a month.  His uric acid was within normal limits.  Uric acid 3.9 on 04/27/21  DDD (degenerative disc disease), cervical-he complains of a stiffness in his cervical spine.  I reviewed x-rays of the cervical spine from April 27, 2021 which were consistent with multilevel spondylosis.  DDD (degenerative disc disease), lumbar-he has chronic lower back pain.  He had x-rays in 2019 which showed degenerative changes and levoscoliosis.  Chondromalacia patellae, right knee-he denies any knee joint discomfort.  Prior x-rays showed chondromalacia patella.  Stage 3a chronic kidney disease (  HCC)-GFR was low in the past but it is normal now.  We will recheck labs today.  Vitamin D deficiency -he had vitamin D deficiency in the past.  He is not taking vitamin D.   Plan: VITAMIN D 25 Hydroxy (Vit-D Deficiency, Fractures)  Primary hypertension-blood pressure is normal now.  Other medical problems are listed as follows:  NASH (nonalcoholic steatohepatitis)  History of asthma  History of hypothyroidism  Multinodular goiter  OSA on CPAP  Orders: Orders Placed This Encounter  Procedures   CBC with Differential/Platelet   COMPLETE METABOLIC PANEL WITH GFR   Sedimentation rate   C-reactive protein   CK   Angiotensin converting enzyme   TSH   VITAMIN D 25 Hydroxy (Vit-D Deficiency, Fractures)   No orders of the defined types were placed in this encounter.    Follow-Up Instructions: Return for Myalgias.   Bo Merino, MD  Note - This record has been created using Editor, commissioning.  Chart creation errors have been sought, but may not always  have been located. Such creation errors do not reflect on  the standard of medical care.,

## 2021-09-20 NOTE — Telephone Encounter (Signed)
Will forward to Dr. Verlee Monte to advise further regarding pt's email.

## 2021-09-20 NOTE — Telephone Encounter (Signed)
Attempted to call, my chart message below sent:  Just to be on the safe side, I'd probably stop the plaquenil/hydroxychloroquine until you're able to see eye doctor. Are you able to see one soon to ensure there's no evidence of toxicity from this medication?

## 2021-09-21 DIAGNOSIS — M5416 Radiculopathy, lumbar region: Secondary | ICD-10-CM | POA: Diagnosis not present

## 2021-09-21 DIAGNOSIS — M9903 Segmental and somatic dysfunction of lumbar region: Secondary | ICD-10-CM | POA: Diagnosis not present

## 2021-09-21 DIAGNOSIS — M5136 Other intervertebral disc degeneration, lumbar region: Secondary | ICD-10-CM | POA: Diagnosis not present

## 2021-09-23 DIAGNOSIS — Z79899 Other long term (current) drug therapy: Secondary | ICD-10-CM | POA: Diagnosis not present

## 2021-09-30 ENCOUNTER — Encounter: Payer: Self-pay | Admitting: Rheumatology

## 2021-09-30 ENCOUNTER — Ambulatory Visit (INDEPENDENT_AMBULATORY_CARE_PROVIDER_SITE_OTHER): Payer: Medicare HMO | Admitting: Rheumatology

## 2021-09-30 VITALS — BP 134/72 | HR 66 | Ht 67.0 in | Wt 201.4 lb

## 2021-09-30 DIAGNOSIS — K7581 Nonalcoholic steatohepatitis (NASH): Secondary | ICD-10-CM | POA: Diagnosis not present

## 2021-09-30 DIAGNOSIS — Z79899 Other long term (current) drug therapy: Secondary | ICD-10-CM

## 2021-09-30 DIAGNOSIS — M1A0720 Idiopathic chronic gout, left ankle and foot, without tophus (tophi): Secondary | ICD-10-CM

## 2021-09-30 DIAGNOSIS — D869 Sarcoidosis, unspecified: Secondary | ICD-10-CM | POA: Diagnosis not present

## 2021-09-30 DIAGNOSIS — M5136 Other intervertebral disc degeneration, lumbar region: Secondary | ICD-10-CM

## 2021-09-30 DIAGNOSIS — I10 Essential (primary) hypertension: Secondary | ICD-10-CM | POA: Diagnosis not present

## 2021-09-30 DIAGNOSIS — M2241 Chondromalacia patellae, right knee: Secondary | ICD-10-CM

## 2021-09-30 DIAGNOSIS — M51369 Other intervertebral disc degeneration, lumbar region without mention of lumbar back pain or lower extremity pain: Secondary | ICD-10-CM

## 2021-09-30 DIAGNOSIS — E559 Vitamin D deficiency, unspecified: Secondary | ICD-10-CM

## 2021-09-30 DIAGNOSIS — N1831 Chronic kidney disease, stage 3a: Secondary | ICD-10-CM | POA: Diagnosis not present

## 2021-09-30 DIAGNOSIS — E042 Nontoxic multinodular goiter: Secondary | ICD-10-CM

## 2021-09-30 DIAGNOSIS — R5383 Other fatigue: Secondary | ICD-10-CM | POA: Diagnosis not present

## 2021-09-30 DIAGNOSIS — M5030 Other cervical disc degeneration, unspecified cervical region: Secondary | ICD-10-CM | POA: Diagnosis not present

## 2021-09-30 DIAGNOSIS — M791 Myalgia, unspecified site: Secondary | ICD-10-CM

## 2021-09-30 DIAGNOSIS — Z8639 Personal history of other endocrine, nutritional and metabolic disease: Secondary | ICD-10-CM

## 2021-09-30 DIAGNOSIS — Z8709 Personal history of other diseases of the respiratory system: Secondary | ICD-10-CM

## 2021-09-30 DIAGNOSIS — G4733 Obstructive sleep apnea (adult) (pediatric): Secondary | ICD-10-CM

## 2021-09-30 DIAGNOSIS — Z9989 Dependence on other enabling machines and devices: Secondary | ICD-10-CM

## 2021-09-30 NOTE — Patient Instructions (Signed)
Standing Labs We placed an order today for your standing lab work.   Please have your standing labs drawn in December and then every 5 months.   If possible, please have your labs drawn 2 weeks prior to your appointment so that the provider can discuss your results at your appointment.  Please note that you may see your imaging and lab results in [facility] before we have reviewed them. We may be awaiting multiple results to interpret others before contacting you. Please allow our office up to 72 hours to thoroughly review all of the results before contacting the office for clarification of your results.  We have open lab daily: Monday through Thursday from 1:30-4:30 PM and Friday from 1:30-4:00 PM at the office of Dr. Bo Merino, [facility].   Please be advised, all patients with office appointments requiring lab work will take precedent over walk-in lab work.  If possible, please come for your lab work on Monday and Friday afternoons, as you may experience shorter wait times. The office is located at [address], [place], [place], [place] [address] No appointment is necessary.   Labs are drawn by [facility]. Please bring your co-pay at the time of your lab draw.  You may receive a bill from [facility] for your lab work.  Please note if you are on Hydroxychloroquine and and an order has been placed for a Hydroxychloroquine level, you will need to have it drawn 4 hours or more after your last dose.  If you wish to have your labs drawn at another location, please call the office 24 hours in advance to send orders.  If you have any questions regarding directions or hours of operation,  please call [phone].   As a reminder, please drink plenty of water prior to coming for your lab work. Thanks!    Hydroxychloroquine Tablets What is this medication? HYDROXYCHLOROQUINE (hye drox ee KLOR oh kwin) treats autoimmune conditions, such as rheumatoid arthritis and lupus. It works  by slowing down an overactive immune system. It may also be used to prevent and treat malaria. It works by killing the parasite that causes malaria. It belongs to a group of medications called DMARDs. This medicine may be used for other purposes; ask your health care provider or pharmacist if you have questions. COMMON BRAND NAME(S): Plaquenil, Quineprox What should I tell my care team before I take this medication? They need to know if you have any of these conditions: Diabetes Eye disease, vision problems G6PD deficiency Heart disease History of irregular heartbeat If you often drink alcohol Kidney disease Liver disease Porphyria Psoriasis An unusual or allergic reaction to chloroquine, hydroxychloroquine, other medications, foods, dyes, or preservatives Pregnant or trying to get pregnant Breast-feeding How should I use this medication? Take this medication by mouth with a glass of water. Take it as directed on the prescription label. Do not cut, crush or chew this medication. Swallow the tablets whole. Take it with food. Do not take it more than directed. Take all of this medication unless your care team tells you to stop it early. Keep taking it even if you think you are better. Take products with antacids in them at a different time of day than this medication. Take this medication 4 hours before or 4 hours after antacids. Talk to your care team if you have questions. Talk to your care team about the use of this medication in children. While this medication may be prescribed for selected conditions, precautions do  apply. Overdosage: If you think you have taken too much of this medicine contact a poison control center or emergency room at once. NOTE: This medicine is only for you. Do not share this medicine with others. What if I miss a dose? If you miss a dose, take it as soon as you can. If it is almost time for your next dose, take only that dose. Do not take double or extra  doses. What may interact with this medication? Do not take this medication with any of the following: Cisapride Dronedarone Pimozide Thioridazine This medication may also interact with the following: Ampicillin Antacids Cimetidine Cyclosporine Digoxin Kaolin Medications for diabetes, like insulin, glipizide, glyburide Medications for seizures like carbamazepine, phenobarbital, phenytoin Mefloquine Methotrexate Other medications that prolong the QT interval (cause an abnormal heart rhythm) Praziquantel This list may not describe all possible interactions. Give your health care provider a list of all the medicines, herbs, non-prescription drugs, or dietary supplements you use. Also tell them if you smoke, drink alcohol, or use illegal drugs. Some items may interact with your medicine. What should I watch for while using this medication? Visit your care team for regular checks on your progress. Tell your care team if your symptoms do not start to get better or if they get worse. You may need blood work done while you are taking this medication. If you take other medications that can affect heart rhythm, you may need more testing. Talk to your care team if you have questions. Your vision may be tested before and during use of this medication. Tell your care team right away if you have any change in your eyesight. This medication may cause serious skin reactions. They can happen weeks to months after starting the medication. Contact your care team right away if you notice fevers or flu-like symptoms with a rash. The rash may be red or purple and then turn into blisters or peeling of the skin. Or, you might notice a red rash with swelling of the face, lips or lymph nodes in your neck or under your arms. If you or your family notice any changes in your behavior, such as new or worsening depression, thoughts of harming yourself, anxiety, or other unusual or disturbing thoughts, or memory loss, call  your care team right away. What side effects may I notice from receiving this medication? Side effects that you should report to your care team as soon as possible: Allergic reactions--skin rash, itching, hives, swelling of the face, lips, tongue, or throat Aplastic anemia--unusual weakness or fatigue, dizziness, headache, trouble breathing, increased bleeding or bruising Change in vision Heart rhythm changes--fast or irregular heartbeat, dizziness, feeling faint or lightheaded, chest pain, trouble breathing Infection--fever, chills, cough, or sore throat Low blood sugar (hypoglycemia)--tremors or shaking, anxiety, sweating, cold or clammy skin, confusion, dizziness, rapid heartbeat Muscle injury--unusual weakness or fatigue, muscle pain, dark yellow or brown urine, decrease in amount of urine Pain, tingling, or numbness in the hands or feet Rash, fever, and swollen lymph nodes Redness, blistering, peeling, or loosening of the skin, including inside the mouth Thoughts of suicide or self-harm, worsening mood, or feelings of depression Unusual bruising or bleeding Side effects that usually do not require medical attention (report to your care team if they continue or are bothersome): Diarrhea Headache Nausea Stomach pain Vomiting This list may not describe all possible side effects. Call your doctor for medical advice about side effects. You may report side effects to FDA at 1-800-FDA-1088. Where should I  keep my medication? Keep out of the reach of children and pets. Store at room temperature up to 30 degrees C (86 degrees F). Protect from light. Get rid of any unused medication after the expiration date. To get rid of medications that are no longer needed or have expired: Take the medication to a medication take-back program. Check with your pharmacy or law enforcement to find a location. If you cannot return the medication, check the label or package insert to see if the medication should  be thrown out in the garbage or flushed down the toilet. If you are not sure, ask your care team. If it is safe to put it in the trash, empty the medication out of the container. Mix the medication with cat litter, dirt, coffee grounds, or other unwanted substance. Seal the mixture in a bag or container. Put it in the trash. NOTE: This sheet is a summary. It may not cover all possible information. If you have questions about this medicine, talk to your doctor, pharmacist, or health care provider.  2023 Elsevier/Gold Standard (2020-07-15 00:00:00)

## 2021-10-01 ENCOUNTER — Other Ambulatory Visit: Payer: Self-pay | Admitting: Emergency Medicine

## 2021-10-02 NOTE — Progress Notes (Signed)
CBC is normal, creatinine is elevated and stable.  LFTs are normal now.  CK is elevated which could be due to hypothyroidism.  Sed rate is normal.  C-reactive protein is normal.  Vitamin D is low normal.  Will forward results to Dr. Mitchel Honour for the treatment of hypothyroidism.  Please advise patient to take vitamin D 2000 units daily.

## 2021-10-04 ENCOUNTER — Encounter: Payer: Self-pay | Admitting: Emergency Medicine

## 2021-10-04 ENCOUNTER — Ambulatory Visit (INDEPENDENT_AMBULATORY_CARE_PROVIDER_SITE_OTHER): Payer: Medicare HMO | Admitting: Emergency Medicine

## 2021-10-04 VITALS — HR 70 | Temp 98.5°F | Ht 67.0 in | Wt 199.5 lb

## 2021-10-04 DIAGNOSIS — D869 Sarcoidosis, unspecified: Secondary | ICD-10-CM | POA: Diagnosis not present

## 2021-10-04 DIAGNOSIS — N1831 Chronic kidney disease, stage 3a: Secondary | ICD-10-CM

## 2021-10-04 DIAGNOSIS — I10 Essential (primary) hypertension: Secondary | ICD-10-CM

## 2021-10-04 DIAGNOSIS — E039 Hypothyroidism, unspecified: Secondary | ICD-10-CM | POA: Diagnosis not present

## 2021-10-04 MED ORDER — LEVOTHYROXINE SODIUM 200 MCG PO TABS: 200.0000 ug | ORAL_TABLET | Freq: Every day | ORAL | 3 refills | Status: DC

## 2021-10-04 NOTE — Assessment & Plan Note (Signed)
Elevated last TSH despite taking Synthroid 175 mg daily. Lab Results  Component Value Date   TSH 6.82 (H) 09/30/2021  Will increase Synthroid dose to 200 mcg daily.

## 2021-10-04 NOTE — Assessment & Plan Note (Signed)
Stable.  Well-controlled.

## 2021-10-04 NOTE — Assessment & Plan Note (Signed)
Advised to stay well-hydrated and avoid NSAIDs.

## 2021-10-04 NOTE — Progress Notes (Signed)
Philip Woodard 71 y.o.   Chief Complaint  Patient presents with   Follow-up    6 mnth f/u appt     HISTORY OF PRESENT ILLNESS: This is a 71 y.o. male here for 42-monthfollow-up of hypertension and hypothyroidism. Has history of sarcoidosis. Doing well.  Feeling a lot better.  Has no complaints or medical concerns today. Recent blood work as follows: CBC is normal, creatinine is elevated and stable. LFTs are normal now. CK is elevated which could be due to hypothyroidism. Sed rate is normal. C-reactive protein is normal. Vitamin D is low normal. Will forward results to Dr. SMitchel Honourfor the treatment of hypothyroidism. Please advise patient to take vitamin D 2000 units daily.  Recently seen by rheumatologist.  Assessment and plan as follows: Assessment / Plan:     Visit Diagnoses: Myalgia-patient gives history of sudden onset myalgias in February 2023.  He states the pain was so severe that he was having difficulty getting out of the bed.  He does not recall difficulty getting out of the chair or getting off the toilet seat.  He states the symptoms eased off during the day and then recurred towards the evening.  He had an excellent response to prednisone.  His labs obtained at that time showed normal sedimentation rate and elevated C-reactive protein.  He has had no recurrence of symptoms since he has been on prednisone 5 mg p.o. daily.  There is no history of temporal headaches.  He had some left eye ocular strain for which she was seen by the ophthalmologist and the eye examination was normal.  He had no temporal artery tenderness on my examination today.  No muscular weakness or tenderness was noted.  I will obtain labs today.  If his labs are normal I may start tapering prednisone by 1 mg every month.  I am still uncertain about the true etiology of his symptoms.   Other fatigue -he had increased fatigue during the time of episode which is resolved now.  He has been going to the gym 3 times a  week without any trouble.  I will obtain labs today.  Plan: CBC with Differential/Platelet, COMPLETE METABOLIC PANEL WITH GFR, Sedimentation rate, C-reactive protein, CK, TSH   High risk medication use-patient was a started on methotrexate by the pulmonologist at the onset of the illness with the suspicion of sarcoidosis flare.  His chest x-ray was unremarkable.  Methotrexate was discontinued due to elevation in LFTs.  Plaquenil was started on Jul 28, 2021 at 200 mg p.o. twice daily.  He has been taking Plaquenil on a regular basis along with prednisone 5 mg p.o. daily.  Patient states he had eye examination by the ophthalmologist a week ago.  We will get records.  Detailed counsel regarding hydroxychloroquine was provided.  Indications side effects contraindications were discussed.  A handout was given and consent was obtained.  We will check labs today and then every 5 months if stable.  He will need annual eye examination.   Patient was counseled on the purpose, proper use, and adverse effects of hydroxychloroquine including nausea/diarrhea, skin rash, headaches, and sun sensitivity.  Advised patient to wear sunscreen once starting hydroxychloroquine to reduce risk of rash associated with sun sensitivity.  Discussed importance of annual eye exams while on hydroxychloroquine to monitor to ocular toxicity and discussed importance of frequent laboratory monitoring.  Provided patient with eye exam form for baseline ophthalmologic exam.  Reviewed risk for QTC prolongation when used in  combination with other QTc prolonging agents (including but not limited to antiarrhythmics, macrolide antibiotics, flouroquinolones, tricyclic antidepressants, citalopram, specific antipsychotics, ondansetron, migraine triptans, and methadone). Provided patient with educational materials on hydroxychloroquine and answered all questions.  Patient consented to hydroxychloroquine. Will upload consent in the media tab.        Sarcoidosis -he was diagnosed with sarcoidosis in 2001.  According to the patient at the time lymph node biopsy was positive.  He took prednisone for 3 months and then symptoms resolved without any recurrence.  His recent chest x-ray was unremarkable.  He denies any history of rash or joint swelling.  04/27/21: ANA negative, RF 11.8, anti-CCP 2, ESR 17, CRP 60, uric acid 3.9 - Plan: Angiotensin converting enzyme   Chronic idiopathic gout involving toe of left foot without tophus -patient self diagnosed himself with gout with intermittent discomfort in his right great toe by consuming beef and pork.  He has been allergic to seafood.  He drinks alcohol 2-3 times a month.  His uric acid was within normal limits.  Uric acid 3.9 on 04/27/21   DDD (degenerative disc disease), cervical-he complains of a stiffness in his cervical spine.  I reviewed x-rays of the cervical spine from April 27, 2021 which were consistent with multilevel spondylosis.   DDD (degenerative disc disease), lumbar-he has chronic lower back pain.  He had x-rays in 2019 which showed degenerative changes and levoscoliosis.   Chondromalacia patellae, right knee-he denies any knee joint discomfort.  Prior x-rays showed chondromalacia patella.   Stage 3a chronic kidney disease (HCC)-GFR was low in the past but it is normal now.  We will recheck labs today.   Vitamin D deficiency -he had vitamin D deficiency in the past.  He is not taking vitamin D.  Plan: VITAMIN D 25 Hydroxy (Vit-D Deficiency, Fractures)   Primary hypertension-blood pressure is normal now.  Other medical problems are listed as follows:   NASH (nonalcoholic steatohepatitis)   History of asthma   History of hypothyroidism   Multinodular goiter   OSA on CPAP    HPI   Prior to Admission medications   Medication Sig Start Date End Date Taking? Authorizing Provider  acetaminophen (TYLENOL) 650 MG CR tablet Take 650 mg by mouth every 8 (eight) hours as  needed.   Yes [provider]  acyclovir (ZOVIRAX) 800 MG tablet Take 1 tablet (800 mg total) by mouth daily. 09/07/21  Yes Horald Pollen, MD  amLODipine (NORVASC) 10 MG tablet TAKE 1 TABLET BY MOUTH EVERY DAY 07/25/21  Yes Deaveon Schoen, Ines Bloomer, MD  cholecalciferol (VITAMIN D3) 25 MCG (1000 UT) tablet Take 1,000 Units by mouth daily.   Yes [provider]  Glucosamine-Chondroit-Vit C-Mn (GLUCOSAMINE 1500 COMPLEX) CAPS Take by mouth daily.   Yes [provider]  hydroxychloroquine (PLAQUENIL) 200 MG tablet Take 2 tablets (400 mg total) by mouth daily. 07/28/21  Yes Maryjane Hurter, MD  levothyroxine (SYNTHROID) 200 MCG tablet Take 1 tablet (200 mcg total) by mouth daily. 10/04/21  Yes Cataldo Cosgriff, Ines Bloomer, MD  Multiple Vitamin (MULTIVITAMIN WITH MINERALS) TABS tablet Take 1 tablet by mouth daily.   Yes [provider]  Multiple Vitamin (MULTIVITAMIN) LIQD Take 5 mLs by mouth daily.   Yes [provider]  Omega-3 Fatty Acids (FISH OIL) 1000 MG CAPS Take 2 capsules by mouth daily.   Yes [provider]  predniSONE (DELTASONE) 5 MG tablet TAKE 1 TABLET BY MOUTH EVERY DAY WITH BREAKFAST 07/26/21  Yes  Maryjane Hurter, MD  tamsulosin (FLOMAX) 0.4 MG CAPS capsule Take 1 capsule (0.4 mg total) by mouth daily. 07/20/21  Yes Zena Vitelli, Ines Bloomer, MD  Turmeric 500 MG TABS Take 1 tablet by mouth 2 (two) times daily.   Yes [provider]  valsartan (DIOVAN) 160 MG tablet Take 1 tablet (160 mg total) by mouth daily. 03/31/21  Yes Armina Galloway, Ines Bloomer, MD  zinc gluconate 50 MG tablet Take 50 mg by mouth daily.   Yes [provider]  sildenafil (REVATIO) 20 MG tablet TAKE 5 TABLETS AS DIRECTED 30-45 MINUTES PRIOR TO INTERCOURSE Patient not taking: Reported on 10/04/2021 03/31/21   Horald Pollen, MD    Allergies  Allergen Reactions   Other     SEAFOOD   Shellfish Allergy Swelling    Tongue and facial Swelling Tongue and  facial Swelling    Patient Active Problem List   Diagnosis Date Noted   Dyspepsia 07/20/2021   Lower urinary tract symptoms 07/20/2021   Cervical strain 04/27/2021   Chronic idiopathic gout involving toe of left foot without tophus 04/27/2021   Stage 3a chronic kidney disease ([place]) 03/31/2021   Erectile dysfunction due to arterial insufficiency 03/31/2021   NASH (nonalcoholic steatohepatitis) 02/22/2015   Arthritis of right lower extremity 07/20/2014   Hearing loss 02/19/2014   OSA on CPAP 05/02/2013   Hypothyroid 03/29/2012   Primary hypertension 11/24/2008   HYPOTHYROIDISM, POSTSURGICAL 07/07/2008   HYPERTROPHY PROSTATE W/UR OBST & OTH LUTS 07/07/2008   [place] DISEASE, LUMBAR 12/27/2007   Sarcoidosis 06/06/2007   GOITER, MULTINODULAR 06/06/2007   HEMORRHOIDS 06/06/2007   ASTHMA 06/06/2007    Past Medical History:  Diagnosis Date   Alkaline phosphatase raised 06/28/2011   Fluctuating - suspect due to sarcoidosis   Allergic rhinitis    Allergy    Arthritis    knees   Genital herpes    Hemorrhoids    Hyperglycemia    Hyperplastic lymph node 06/27/2011   Submental node excised 1/02; regrowth: re-excision 10/06   Hypertension    Hypothyroidism    Multinodular goiter    Routine general medical examination at a health care facility    Sarcoidosis    Sleep apnea    wears cpap     Past Surgical History:  Procedure Laterality Date   COLONOSCOPY  5-23/2003   COLONOSCOPY  02/04/2018   TA   COLONOSCOPY WITH PROPOFOL  05/26/2021   [facility] at [place] [facility] [id number]   THYROIDECTOMY  2002    Social History   Socioeconomic History   Marital status: Married    Spouse name: Not on file   Number of children: Not on file   Years of education: Not on file   Highest education level: Not on file  Occupational History   Not on file  Tobacco Use   Smoking status: Never    Passive exposure: Never   Smokeless tobacco: Never   Vaping Use   Vaping Use: Never used  Substance and Sexual Activity   Alcohol use: Yes    Comment: 3 times per month   Drug use: Never   Sexual activity: Not on file  Other Topics Concern   Not on file  Social History Narrative   Not on file   Social Determinants of Health   Financial Resource Strain: Low Risk  (04/04/2021)   Overall Financial Resource Strain (CARDIA)    Difficulty of Paying Living Expenses: Not  hard at all  Food Insecurity: No Food Insecurity (04/04/2021)   Hunger Vital Sign    Worried About Running Out of Food in the Last Year: Never true    Ran Out of Food in the Last Year: Never true  Transportation Needs: No Transportation Needs (04/04/2021)   PRAPARE - Hydrologist (Medical): No    Lack of Transportation (Non-Medical): No  Physical Activity: Sufficiently Active (04/04/2021)   Exercise Vital Sign    Days of Exercise per Week: 3 days    Minutes of Exercise per Session: 60 min  Stress: No Stress Concern Present (04/04/2021)   [place]    Feeling of Stress : Not at all  Social Connections: Moderately Integrated (04/04/2021)   Social Connection and Isolation Panel [NHANES]    Frequency of Communication with Friends and Family: Twice a week    Frequency of Social Gatherings with Friends and Family: Twice a week    Attends Religious Services: More than 4 times per year    Active Member of [employer] or Organizations: No    Attends Archivist Meetings: Never    Marital Status: Married  Human resources officer Violence: Not At Risk (04/04/2021)   Humiliation, Afraid, Rape, and Kick questionnaire    Fear of Current or Ex-Partner: No    Emotionally Abused: No    Physically Abused: No    Sexually Abused: No    Family History  Problem Relation Age of Onset   Hypertension Mother    Dementia Father    Sleep apnea Brother    Healthy Brother    Sleep apnea Paternal Aunt     Asthma Cousin    Healthy Daughter    Healthy Daughter    Sarcoidosis Neg Hx    Colon polyps Neg Hx    Colon cancer Neg Hx    Esophageal cancer Neg Hx    Stomach cancer Neg Hx    Rectal cancer Neg Hx      Review of Systems  Constitutional: Negative.  Negative for chills and fever.  HENT: Negative.  Negative for congestion and sore throat.   Respiratory: Negative.  Negative for cough and shortness of breath.   Cardiovascular: Negative.  Negative for chest pain and palpitations.  Gastrointestinal:  Negative for abdominal pain, diarrhea, nausea and vomiting.  Genitourinary: Negative.   Skin: Negative.  Negative for rash.  Neurological:  Negative for dizziness and headaches.  All other systems reviewed and are negative.  Today's Vitals   10/04/21 1017  Pulse: 70  Temp: 98.5 F (36.9 C)  TempSrc: Oral  SpO2: 97%  Weight: 199 lb 8 oz (90.5 kg)  Height: 5' 7"  (1.702 m)   Body mass index is 31.25 kg/m.   Physical Exam Vitals reviewed.  Constitutional:      Appearance: Normal appearance.  HENT:     Head: Normocephalic.     Mouth/Throat:     Mouth: Mucous membranes are moist.     Pharynx: Oropharynx is clear.  Eyes:     Extraocular Movements: Extraocular movements intact.     Conjunctiva/sclera: Conjunctivae normal.     Pupils: Pupils are equal, round, and reactive to light.  Cardiovascular:     Rate and Rhythm: Normal rate and regular rhythm.     Pulses: Normal pulses.     Heart sounds: Normal heart sounds.  Pulmonary:     Effort: Pulmonary effort is normal.  Breath sounds: Normal breath sounds.  Musculoskeletal:     Cervical back: No tenderness.     Right lower leg: No edema.     Left lower leg: No edema.  Lymphadenopathy:     Cervical: No cervical adenopathy.  Skin:    General: Skin is warm and dry.     Capillary Refill: Capillary refill takes less than 2 seconds.  Neurological:     General: No focal deficit present.     Mental Status: He is alert and  oriented to person, place, and time.  Psychiatric:        Mood and Affect: Mood normal.        Behavior: Behavior normal.      ASSESSMENT & PLAN: A total of 49-minute was spent with the patient and counseling/coordination of care regarding preparing for this visit, review of most recent office visit note, review of most recent rheumatologist office visit notes, review of most recent lab results, review of all medications and changes made, review of multiple chronic medical conditions and their management, education on nutrition, prognosis, documentation, and need for follow-up.  Problem List Items Addressed This Visit       Cardiovascular and Mediastinum   Primary hypertension    Well-controlled hypertension. Continue valsartan 160 mg and amlodipine 10 mg daily. BP Readings from Last 3 Encounters:  09/30/21 134/72  07/28/21 134/74  07/20/21 130/70  Cardiovascular risk associated with hypertension discussed. Dietary approaches to stop hypertension discussed.         Endocrine   Hypothyroid - Primary    Elevated last TSH despite taking Synthroid 175 mg daily. Lab Results  Component Value Date   TSH 6.82 (H) 09/30/2021  Will increase Synthroid dose to 200 mcg daily.       Relevant Medications   levothyroxine (SYNTHROID) 200 MCG tablet     Genitourinary   Stage 3a chronic kidney disease ([place])    Advised to stay well-hydrated and avoid NSAIDs.        Other   Sarcoidosis    Stable.  Well-controlled.      Patient Instructions  Health Maintenance After Age 62 After age 13, you are at a higher risk for certain long-term diseases and infections as well as injuries from falls. Falls are a major cause of broken bones and head injuries in people who are older than age 4. Getting regular preventive care can help to keep you healthy and well. Preventive care includes getting regular testing and making lifestyle changes as recommended by your health care provider. Talk with  your health care provider about: Which screenings and tests you should have. A screening is a test that checks for a disease when you have no symptoms. A diet and exercise plan that is right for you. What should I know about screenings and tests to prevent falls? Screening and testing are the best ways to find a health problem early. Early diagnosis and treatment give you the best chance of managing medical conditions that are common after age 77. Certain conditions and lifestyle choices may make you more likely to have a fall. Your health care provider may recommend: Regular vision checks. Poor vision and conditions such as cataracts can make you more likely to have a fall. If you wear glasses, make sure to get your prescription updated if your vision changes. Medicine review. Work with your health care provider to regularly review all of the medicines you are taking, including over-the-counter medicines. Ask your health care provider  about any side effects that may make you more likely to have a fall. Tell your health care provider if any medicines that you take make you feel dizzy or sleepy. Strength and balance checks. Your health care provider may recommend certain tests to check your strength and balance while standing, walking, or changing positions. Foot health exam. Foot pain and numbness, as well as not wearing proper footwear, can make you more likely to have a fall. Screenings, including: Osteoporosis screening. Osteoporosis is a condition that causes the bones to get weaker and break more easily. Blood pressure screening. Blood pressure changes and medicines to control blood pressure can make you feel dizzy. Depression screening. You may be more likely to have a fall if you have a fear of falling, feel depressed, or feel unable to do activities that you used to do. Alcohol use screening. Using too much alcohol can affect your balance and may make you more likely to have a fall. Follow  these instructions at home: Lifestyle Do not drink alcohol if: Your health care provider tells you not to drink. If you drink alcohol: Limit how much you have to: 0-1 drink a day for women. 0-2 drinks a day for men. Know how much alcohol is in your drink. In the [place] one drink equals one 12 oz bottle of beer (355 mL), one 5 oz glass of wine (148 mL), or one 1 oz glass of hard liquor (44 mL). Do not use any products that contain nicotine or tobacco. These products include cigarettes, chewing tobacco, and vaping devices, such as e-cigarettes. If you need help quitting, ask your health care provider. Activity  Follow a regular exercise program to stay fit. This will help you maintain your balance. Ask your health care provider what types of exercise are appropriate for you. If you need a cane or walker, use it as recommended by your health care provider. Wear supportive shoes that have nonskid soles. Safety  Remove any tripping hazards, such as rugs, cords, and clutter. Install safety equipment such as grab bars in bathrooms and safety rails on stairs. Keep rooms and walkways well-lit. General instructions Talk with your health care provider about your risks for falling. Tell your health care provider if: You fall. Be sure to tell your health care provider about all falls, even ones that seem minor. You feel dizzy, tiredness (fatigue), or off-balance. Take over-the-counter and prescription medicines only as told by your health care provider. These include supplements. Eat a healthy diet and maintain a healthy weight. A healthy diet includes low-fat dairy products, low-fat (lean) meats, and fiber from whole grains, beans, and lots of fruits and vegetables. Stay current with your vaccines. Schedule regular health, dental, and eye exams. Summary Having a healthy lifestyle and getting preventive care can help to protect your health and wellness after age 28. Screening and testing are the  best way to find a health problem early and help you avoid having a fall. Early diagnosis and treatment give you the best chance for managing medical conditions that are more common for people who are older than age 49. Falls are a major cause of broken bones and head injuries in people who are older than age 69. Take precautions to prevent a fall at home. Work with your health care provider to learn what changes you can make to improve your health and wellness and to prevent falls. This information is not intended to replace advice given to you by your health care provider.  Make sure you discuss any questions you have with your health care provider. Document Revised: 07/19/2020 Document Reviewed: 07/19/2020 Elsevier Patient Education  [place], MD [facility]

## 2021-10-04 NOTE — Assessment & Plan Note (Signed)
Well-controlled hypertension. Continue valsartan 160 mg and amlodipine 10 mg daily. BP Readings from Last 3 Encounters:  09/30/21 134/72  07/28/21 134/74  07/20/21 130/70  Cardiovascular risk associated with hypertension discussed. Dietary approaches to stop hypertension discussed.

## 2021-10-04 NOTE — Patient Instructions (Signed)
Health Maintenance After Age 69 After age 69, you are at a higher risk for certain long-term diseases and infections as well as injuries from falls. Falls are a major cause of broken bones and head injuries in people who are older than age 69. Getting regular preventive care can help to keep you healthy and well. Preventive care includes getting regular testing and making lifestyle changes as recommended by your health care provider. Talk with your health care provider about: Which screenings and tests you should have. A screening is a test that checks for a disease when you have no symptoms. A diet and exercise plan that is right for you. What should I know about screenings and tests to prevent falls? Screening and testing are the best ways to find a health problem early. Early diagnosis and treatment give you the best chance of managing medical conditions that are common after age 69. Certain conditions and lifestyle choices may make you more likely to have a fall. Your health care provider may recommend: Regular vision checks. Poor vision and conditions such as cataracts can make you more likely to have a fall. If you wear glasses, make sure to get your prescription updated if your vision changes. Medicine review. Work with your health care provider to regularly review all of the medicines you are taking, including over-the-counter medicines. Ask your health care provider about any side effects that may make you more likely to have a fall. Tell your health care provider if any medicines that you take make you feel dizzy or sleepy. Strength and balance checks. Your health care provider may recommend certain tests to check your strength and balance while standing, walking, or changing positions. Foot health exam. Foot pain and numbness, as well as not wearing proper footwear, can make you more likely to have a fall. Screenings, including: Osteoporosis screening. Osteoporosis is a condition that causes  the bones to get weaker and break more easily. Blood pressure screening. Blood pressure changes and medicines to control blood pressure can make you feel dizzy. Depression screening. You may be more likely to have a fall if you have a fear of falling, feel depressed, or feel unable to do activities that you used to do. Alcohol use screening. Using too much alcohol can affect your balance and may make you more likely to have a fall. Follow these instructions at home: Lifestyle Do not drink alcohol if: Your health care provider tells you not to drink. If you drink alcohol: Limit how much you have to: 0-1 drink a day for women. 0-2 drinks a day for men. Know how much alcohol is in your drink. In the [place] one drink equals one 12 oz bottle of beer (355 mL), one 5 oz glass of wine (148 mL), or one 1 oz glass of hard liquor (44 mL). Do not use any products that contain nicotine or tobacco. These products include cigarettes, chewing tobacco, and vaping devices, such as e-cigarettes. If you need help quitting, ask your health care provider. Activity  Follow a regular exercise program to stay fit. This will help you maintain your balance. Ask your health care provider what types of exercise are appropriate for you. If you need a cane or walker, use it as recommended by your health care provider. Wear supportive shoes that have nonskid soles. Safety  Remove any tripping hazards, such as rugs, cords, and clutter. Install safety equipment such as grab bars in bathrooms and safety rails on stairs. Keep rooms and walkways   well-lit. General instructions Talk with your health care provider about your risks for falling. Tell your health care provider if: You fall. Be sure to tell your health care provider about all falls, even ones that seem minor. You feel dizzy, tiredness (fatigue), or off-balance. Take over-the-counter and prescription medicines only as told by your health care provider. These include  supplements. Eat a healthy diet and maintain a healthy weight. A healthy diet includes low-fat dairy products, low-fat (lean) meats, and fiber from whole grains, beans, and lots of fruits and vegetables. Stay current with your vaccines. Schedule regular health, dental, and eye exams. Summary Having a healthy lifestyle and getting preventive care can help to protect your health and wellness after age 69. Screening and testing are the best way to find a health problem early and help you avoid having a fall. Early diagnosis and treatment give you the best chance for managing medical conditions that are more common for people who are older than age 69. Falls are a major cause of broken bones and head injuries in people who are older than age 69. Take precautions to prevent a fall at home. Work with your health care provider to learn what changes you can make to improve your health and wellness and to prevent falls. This information is not intended to replace advice given to you by your health care provider. Make sure you discuss any questions you have with your health care provider. Document Revised: 07/19/2020 Document Reviewed: 07/19/2020 Elsevier Patient Education  2023 [employer] Inc.  

## 2021-10-05 ENCOUNTER — Telehealth: Payer: Self-pay | Admitting: Rheumatology

## 2021-10-05 NOTE — Telephone Encounter (Signed)
Attempted to contact the patient and left message for patient to call the office.  

## 2021-10-05 NOTE — Telephone Encounter (Signed)
Patient left a voicemail attempting to return Andrea's call regarding labs. Patient requests a call back.

## 2021-10-06 ENCOUNTER — Encounter: Payer: Self-pay | Admitting: *Deleted

## 2021-10-06 LAB — CBC WITH DIFFERENTIAL/PLATELET
Absolute Monocytes: 1003 cells/uL — ABNORMAL HIGH (ref 200–950)
Basophils Absolute: 70 cells/uL (ref 0–200)
Basophils Relative: 0.8 %
Eosinophils Absolute: 264 cells/uL (ref 15–500)
Eosinophils Relative: 3 %
HCT: 41.9 % (ref 38.5–50.0)
Hemoglobin: 13.9 g/dL (ref 13.2–17.1)
Lymphs Abs: 2341 cells/uL (ref 850–3900)
MCH: 30.4 pg (ref 27.0–33.0)
MCHC: 33.2 g/dL (ref 32.0–36.0)
MCV: 91.7 fL (ref 80.0–100.0)
MPV: 10.4 fL (ref 7.5–12.5)
Monocytes Relative: 11.4 %
Neutro Abs: 5122 cells/uL (ref 1500–7800)
Neutrophils Relative %: 58.2 %
Platelets: 294 10*3/uL (ref 140–400)
RBC: 4.57 10*6/uL (ref 4.20–5.80)
RDW: 13.1 % (ref 11.0–15.0)
Total Lymphocyte: 26.6 %
WBC: 8.8 10*3/uL (ref 3.8–10.8)

## 2021-10-06 LAB — COMPLETE METABOLIC PANEL WITH GFR
AG Ratio: 1.7 (calc) (ref 1.0–2.5)
ALT: 16 U/L (ref 9–46)
AST: 23 U/L (ref 10–35)
Albumin: 4.3 g/dL (ref 3.6–5.1)
Alkaline phosphatase (APISO): 67 U/L (ref 35–144)
BUN/Creatinine Ratio: 12 (calc) (ref 6–22)
BUN: 16 mg/dL (ref 7–25)
CO2: 31 mmol/L (ref 20–32)
Calcium: 9.8 mg/dL (ref 8.6–10.3)
Chloride: 105 mmol/L (ref 98–110)
Creat: 1.38 mg/dL — ABNORMAL HIGH (ref 0.70–1.35)
Globulin: 2.5 g/dL (calc) (ref 1.9–3.7)
Glucose, Bld: 84 mg/dL (ref 65–99)
Potassium: 4.2 mmol/L (ref 3.5–5.3)
Sodium: 141 mmol/L (ref 135–146)
Total Bilirubin: 0.6 mg/dL (ref 0.2–1.2)
Total Protein: 6.8 g/dL (ref 6.1–8.1)
eGFR: 55 mL/min/{1.73_m2} — ABNORMAL LOW (ref >=60)

## 2021-10-06 LAB — TSH: TSH: 6.82 mIU/L — ABNORMAL HIGH (ref 0.40–4.50)

## 2021-10-06 LAB — VITAMIN D 25 HYDROXY (VIT D DEFICIENCY, FRACTURES): Vit D, 25-Hydroxy: 31 ng/mL (ref 30–100)

## 2021-10-06 LAB — CK: Total CK: 346 U/L — ABNORMAL HIGH (ref 44–196)

## 2021-10-06 LAB — ANGIOTENSIN CONVERTING ENZYME: Angiotensin-Converting Enzyme: 12 U/L (ref 9–67)

## 2021-10-06 LAB — C-REACTIVE PROTEIN: CRP: 3.3 mg/L (ref <8.0)

## 2021-10-06 LAB — SEDIMENTATION RATE: Sed Rate: 2 mm/h (ref 0–20)

## 2021-10-15 ENCOUNTER — Other Ambulatory Visit: Payer: Self-pay | Admitting: Emergency Medicine

## 2021-10-15 DIAGNOSIS — R399 Unspecified symptoms and signs involving the genitourinary system: Secondary | ICD-10-CM

## 2021-10-17 ENCOUNTER — Ambulatory Visit: Payer: Medicare HMO | Admitting: Family Medicine

## 2021-10-17 ENCOUNTER — Encounter: Payer: Self-pay | Admitting: Family Medicine

## 2021-10-17 VITALS — BP 140/68 | Ht 67.0 in | Wt 199.0 lb

## 2021-10-17 DIAGNOSIS — R519 Headache, unspecified: Secondary | ICD-10-CM | POA: Insufficient documentation

## 2021-10-17 DIAGNOSIS — M5412 Radiculopathy, cervical region: Secondary | ICD-10-CM

## 2021-10-17 NOTE — Assessment & Plan Note (Signed)
Acutely occurring over the past 3 weeks. No changes in vision. Occurred with movement to the left side. No dental procedures and no history of surgery. Symptoms are intermittent in nature. History of sarcoidosis and on Plaquenil and prednisone.  Seems less likely for cardiovascular in nature or giant cell arteritis. -Counseled on home exercise therapy and supportive care. -Sed rate and CRP. -Could consider further testing

## 2021-10-17 NOTE — Patient Instructions (Signed)
Good to see you I will call with the results from today. Then I will send in a medicine based on which is most appropriate.   Please send me a message in MyChart with any questions or updates.  Please see me back in 4 weeks or as needed if better.   --Dr. Raeford Razor

## 2021-10-17 NOTE — Assessment & Plan Note (Signed)
Acute occurring for the past 3 weeks. Having radicular pain down the left arm.  - counseled on home exercise therapy and supportive care - could consider gabapentin or prednisone.

## 2021-10-17 NOTE — Progress Notes (Signed)
  Philip Woodard - 71 y.o. male MRN [id number]  Date of birth: December 04, 1950  SUBJECTIVE:  Including CC & ROS.  No chief complaint on file.   Philip Woodard is a 71 y.o. male that is presenting with left-sided facial pain that is intermittent with certain lateral rotation to the left as well as left-sided chest and left arm pain.  This is more of an altered sensation as a complete pain.  Not associated with shortness of breath, nausea or associated with exercise.   Review of Systems See HPI   HISTORY: Past Medical, Surgical, Social, and Family History Reviewed & Updated per EMR.   Pertinent Historical Findings include:  Past Medical History:  Diagnosis Date   Alkaline phosphatase raised 06/28/2011   Fluctuating - suspect due to sarcoidosis   Allergic rhinitis    Allergy    Arthritis    knees   Genital herpes    Hemorrhoids    Hyperglycemia    Hyperplastic lymph node 06/27/2011   Submental node excised 1/02; regrowth: re-excision 10/06   Hypertension    Hypothyroidism    Multinodular goiter    Routine general medical examination at a health care facility    Sarcoidosis    Sleep apnea    wears cpap     Past Surgical History:  Procedure Laterality Date   COLONOSCOPY  5-23/2003   COLONOSCOPY  02/04/2018   TA   COLONOSCOPY WITH PROPOFOL  05/26/2021   [facility] at [place] Bilateral [id number]   THYROIDECTOMY  2002     PHYSICAL EXAM:  VS: BP (!) 140/68 (BP Location: Left Arm, Patient Position: Sitting)   Ht 5' 7"  (1.702 m)   Wt 199 lb (90.3 kg)   BMI 31.17 kg/m  Physical Exam Gen: NAD, alert, cooperative with exam, well-appearing MSK:  Neurovascularly intact       ASSESSMENT & PLAN:   Cervical radiculopathy Acute occurring for the past 3 weeks. Having radicular pain down the left arm.  - counseled on home exercise therapy and supportive care - could consider gabapentin or prednisone.   Facial pain, acute Acutely  occurring over the past 3 weeks. No changes in vision. Occurred with movement to the left side. No dental procedures and no history of surgery. Symptoms are intermittent in nature. History of sarcoidosis and on Plaquenil and prednisone.  Seems less likely for cardiovascular in nature or giant cell arteritis. -Counseled on home exercise therapy and supportive care. -Sed rate and CRP. -Could consider further testing

## 2021-10-18 ENCOUNTER — Telehealth: Payer: Self-pay | Admitting: Family Medicine

## 2021-10-18 LAB — C-REACTIVE PROTEIN: CRP: 2 mg/L (ref 0–10)

## 2021-10-18 LAB — SEDIMENTATION RATE: Sed Rate: 3 mm/hr (ref 0–30)

## 2021-10-18 MED ORDER — GABAPENTIN 300 MG PO CAPS: 300.0000 mg | ORAL_CAPSULE | Freq: Three times a day (TID) | ORAL | 1 refills | Status: DC

## 2021-10-18 NOTE — Telephone Encounter (Signed)
Informed of results.  Possible for radicular pain versus trigeminal neuralgia.  We will try gabapentin.  Rosemarie Ax, MD [facility] 10/18/2021, 10:08 AM

## 2021-10-18 NOTE — Progress Notes (Signed)
Office Visit Note  Patient: Philip Woodard             Date of Birth: 1951-02-02           MRN: [id number]             PCP: Horald Pollen, MD Referring: Rosemarie Ax, MD Visit Date: 11/01/2021 Occupation: @GUAROCC @  Subjective:  Neck pain and stiffness   History of Present Illness: Philip Woodard is a 71 y.o. male possible diagnosis of polymyalgia rheumatica.  He has been on prednisone 5 mg p.o. daily since the last visit.  He denies any difficulty getting out of the chair or off the bed.  He has been having discomfort and stiffness in his cervical spine with some numbness going into his right side.  He has been taking gabapentin to get some relief.  He has not noticed any joint pain or joint swelling.  He has been working out on a regular basis.  He has been taking hydroxychloroquine 200 mg p.o. twice daily without any side effects which was a started by his pulmonologist.  He has not had any gout flares.  He denies any lower back pain today.  Activities of Daily Living:  Patient reports morning stiffness for 0 minutes.   Patient Denies nocturnal pain.  Difficulty dressing/grooming: Denies Difficulty climbing stairs: Denies Difficulty getting out of chair: Denies Difficulty using hands for taps, buttons, cutlery, and/or writing: Denies  Review of Systems  Constitutional:  Negative for fatigue.  HENT:  Negative for mouth sores and mouth dryness.   Eyes:  Negative for dryness.  Respiratory:  Negative for shortness of breath.   Cardiovascular:  Negative for chest pain and palpitations.  Gastrointestinal:  Negative for blood in stool, constipation and diarrhea.  Endocrine: Negative for increased urination.  Genitourinary:  Negative for involuntary urination.  Musculoskeletal:  Negative for joint pain, gait problem, joint pain, joint swelling, myalgias, muscle weakness, morning stiffness, muscle tenderness and myalgias.  Skin:  Positive for sensitivity to sunlight.  Negative for color change and rash.  Allergic/Immunologic: Negative for susceptible to infections.  Neurological:  Positive for numbness. Negative for dizziness and headaches.  Hematological:  Negative for swollen glands.  Psychiatric/Behavioral:  Negative for depressed mood and sleep disturbance. The patient is not nervous/anxious.     PMFS History:  Patient Active Problem List   Diagnosis Date Noted   Cervical radiculopathy 10/17/2021   Facial pain, acute 10/17/2021   Dyspepsia 07/20/2021   Lower urinary tract symptoms 07/20/2021   Cervical strain 04/27/2021   Chronic idiopathic gout involving toe of left foot without tophus 04/27/2021   Stage 3a chronic kidney disease ([place]) 03/31/2021   Erectile dysfunction due to arterial insufficiency 03/31/2021   NASH (nonalcoholic steatohepatitis) 02/22/2015   Arthritis of right lower extremity 07/20/2014   Hearing loss 02/19/2014   OSA on CPAP 05/02/2013   Hypothyroid 03/29/2012   Primary hypertension 11/24/2008   HYPOTHYROIDISM, POSTSURGICAL 07/07/2008   HYPERTROPHY PROSTATE W/UR OBST & OTH LUTS 07/07/2008   [place] DISEASE, LUMBAR 12/27/2007   Sarcoidosis 06/06/2007   GOITER, MULTINODULAR 06/06/2007   HEMORRHOIDS 06/06/2007   ASTHMA 06/06/2007    Past Medical History:  Diagnosis Date   Alkaline phosphatase raised 06/28/2011   Fluctuating - suspect due to sarcoidosis   Allergic rhinitis    Allergy    Arthritis    knees   Genital herpes    Hemorrhoids    Hyperglycemia    Hyperplastic lymph node 06/27/2011  Submental node excised 1/02; regrowth: re-excision 10/06   Hypertension    Hypothyroidism    Multinodular goiter    Routine general medical examination at a health care facility    Sarcoidosis    Sleep apnea    wears cpap     Family History  Problem Relation Age of Onset   Hypertension Mother    Dementia Father    Sleep apnea Brother    Healthy Brother    Sleep apnea Paternal Aunt    Asthma Cousin    Healthy  Daughter    Healthy Daughter    Sarcoidosis Neg Hx    Colon polyps Neg Hx    Colon cancer Neg Hx    Esophageal cancer Neg Hx    Stomach cancer Neg Hx    Rectal cancer Neg Hx    Past Surgical History:  Procedure Laterality Date   COLONOSCOPY  5-23/2003   COLONOSCOPY  02/04/2018   TA   COLONOSCOPY WITH PROPOFOL  05/26/2021   [facility] at [facility] Bilateral [id number]   THYROIDECTOMY  2002   Social History   Social History Narrative   Not on file   Immunization History  Administered Date(s) Administered   Fluad Quad(high Dose 65+) 12/11/2018, 01/20/2020, 03/31/2021   Influenza Whole 12/11/2009, 12/11/2011   Influenza, High Dose Seasonal PF 11/14/2017   Influenza,inj,Quad PF,6+ Mos 01/31/2013, 12/10/2013, 02/22/2016, 12/19/2016   Moderna Sars-Covid-2 Vaccination 03/18/2020   PFIZER(Purple Top)SARS-COV-2 Vaccination 04/04/2019, 04/25/2019   Pneumococcal Conjugate-13 11/14/2017   Pneumococcal Polysaccharide-23 01/31/2013   Td 10/23/2005   Tdap 02/22/2016   Zoster, Live 01/31/2013     Objective: Vital Signs: BP (!) 148/75 (BP Location: Left Arm, Patient Position: Sitting, Cuff Size: Normal)   Pulse 84   Resp 18   Ht 5' 7"  (1.702 m)   Wt 200 lb 12.8 oz (91.1 kg)   BMI 31.45 kg/m    Physical Exam Vitals and nursing note reviewed.  Constitutional:      Appearance: He is well-developed.  HENT:     Head: Normocephalic and atraumatic.  Eyes:     Conjunctiva/sclera: Conjunctivae normal.     Pupils: Pupils are equal, round, and reactive to light.  Cardiovascular:     Rate and Rhythm: Normal rate and regular rhythm.     Heart sounds: Normal heart sounds.  Pulmonary:     Effort: Pulmonary effort is normal.     Breath sounds: Normal breath sounds.  Abdominal:     General: Bowel sounds are normal.     Palpations: Abdomen is soft.  Musculoskeletal:     Cervical back: Normal range of motion and neck supple.  Skin:    General: Skin  is warm and dry.     Capillary Refill: Capillary refill takes less than 2 seconds.  Neurological:     Mental Status: He is alert and oriented to person, place, and time.  Psychiatric:        Behavior: Behavior normal.      Musculoskeletal Exam: He had limited painful range of motion of his cervical spine.  There was no tenderness over thoracic or lumbar spine.  Shoulder joints, elbow joints, wrist joints, MCPs PIPs and DIPs with good range of motion with no synovitis.  Hip joints and knee joints in good range of motion.  There was no tenderness over ankles or MTPs.  He had no muscular weakness or tenderness.  He had no difficulty getting out  of the chair.  CDAI Exam: CDAI Score: -- Patient Global: --; Provider Global: -- Swollen: --; Tender: -- Joint Exam 11/01/2021   No joint exam has been documented for this visit   There is currently no information documented on the homunculus. Go to the Rheumatology activity and complete the homunculus joint exam.  Investigation: No additional findings.  Imaging: No results found.  Recent Labs: Lab Results  Component Value Date   WBC 8.8 09/30/2021   HGB 13.9 09/30/2021   PLT 294 09/30/2021   NA 141 09/30/2021   K 4.2 09/30/2021   CL 105 09/30/2021   CO2 31 09/30/2021   GLUCOSE 84 09/30/2021   BUN 16 09/30/2021   CREATININE 1.38 (H) 09/30/2021   BILITOT 0.6 09/30/2021   ALKPHOS 62 07/25/2021   AST 23 09/30/2021   ALT 16 09/30/2021   PROT 6.8 09/30/2021   ALBUMIN 4.3 07/25/2021   CALCIUM 9.8 09/30/2021   GFRAA  02/20/2009    >60        The eGFR has been calculated using the MDRD equation. This calculation has not been validated in all clinical situations. eGFR's persistently <60 mL/min signify possible Chronic Kidney Disease.   QFTBGOLDPLUS NEGATIVE 07/08/2021    October 17, 2021 ESR 3, C-reactive protein 2 September 30, 2021 ESR 2, C-reactive protein 3.3, CK346, AST 12, TSH 6.82, vitamin D 31  Speciality Comments: [facility] Eye  Exam 09/23/2021 normal [facility] f/u 12 months.  Procedures:  No procedures performed Allergies: Other and Shellfish allergy   Assessment / Plan:     Visit Diagnoses: Myalgia - History of myalgias since February 2023 he had difficulty getting of the bed and also getting out of the chair at the time.  This raises concern about possible polymyalgia rheumatica.  He did well on low-dose prednisone and continues to do well.  His sed rate has been normal.  C-reactive protein was initially elevated.  He is currently on prednisone 5 mg p.o. daily.  I discussed reducing prednisone to 4 mg p.o. daily for the next 2 months.  I will repeat  labs after 2 months.  Elevated CK -CK was elevated at 346.  He also works out at [employer] 3 times a week and lifts heavy weights.  Plan repeat CK, aldolase, myositis panel today.  Other fatigue - Improved on prednisone.  High risk medication use -Plaquenil 200 mg p.o. twice daily.  Prednisone 5 mg p.o. daily currently.  He was placed on methotrexate by the pulmonologist which was discontinued on Jul 28, 2021 due to elevation in the LFTs and Plaquenil was added by his PCP  Sarcoidosis - Diagnosed 2001, positive lymph node biopsy.  Treated with prednisone for 3 months.  Pulmonary symptoms have been in remission.  He is followed by his pulmonologist.  Chronic idiopathic gout involving toe of left foot without tophus - Related to diet.  His uric acid has been normal.  He is not taking any treatment.  Chondromalacia patellae, right knee -he is currently not having any discomfort in his right knee.  Noted on previous x-rays.  DDD (degenerative disc disease), cervical -he has been having increased discomfort and stiffness with some radiculopathy.  He had discomfort with lateral rotation of the cervical spine.  X-rays from February 2023 showed multilevel spondylosis.  I will refer him to physical therapy.  A handout on neck exercises was given.  He was advised to  avoid lifting weights overhead.  DDD (degenerative disc disease), lumbar - Levoscoliosis  and multilevel spondylosis was noted on previous x-rays.  He denies any discomfort today.  Stage 3a chronic kidney disease (HCC)-creatinine was 1.38 and GFR 55 with the last visit.  Vitamin D deficiency - September 30, 2021 vitamin D 31.  Primary hypertension-blood pressure was still mildly elevated.  He was advised to monitor blood pressure closely.  Other medical problems are listed as follows:  NASH (nonalcoholic steatohepatitis)  History of asthma  History of hypothyroidism-TSH was elevated at 6.82.  I forwarded the results to his PCP and discussed results with the patient today.  Multinodular goiter  OSA on CPAP  Orders: Orders Placed This Encounter  Procedures   CK   Aldolase   3-Hydroxy-3-Methylglutaryl-Coenzyme A Reductase (HMGCR) AB (IgG)   Other/Misc lab test   No orders of the defined types were placed in this encounter.    Follow-Up Instructions: Return in about 2 months (around 01/01/2022) for Osteoarthritis, PMR.   Bo Merino, MD  Note - This record has been created using Editor, commissioning.  Chart creation errors have been sought, but may not always  have been located. Such creation errors do not reflect on  the standard of medical care.

## 2021-10-23 ENCOUNTER — Other Ambulatory Visit: Payer: Self-pay | Admitting: Emergency Medicine

## 2021-10-31 NOTE — Progress Notes (Unsigned)
Synopsis: Referred for sarcoid by Horald Pollen, *  Subjective:   PATIENT ID: Philip Woodard GENDER: male DOB: 16-Dec-1950, MRN: [id number]  No chief complaint on file.  71yM with history of sarcoid (TBLB with [place] granuloma and neg AFB/fungal stains in 2002), OSA on CPAP, CKD3a, multinodular goiter, rhinitis  Regarding his sarcoidosis he was only only prednisone for 3 months after diagnosis in 2002.   He has no trouble breathing, including with exertion, no cough. No CP, vision changes including no photophobia, palpitations.   Some indigestion 2-3x per week. Usually after eating. Especially at night, can frequently have sour taste in his mouth. Takes tums with some improvement. He thinks he was on ppi at some point in the past - didn't want to remain on it indefinitely.  Takes claritin consistently, flonase intermittently for rhinitis. He thinks claritin is more helpful though.   Two aunts on father's side probably had sarcoid as well  He works as a Landscape architect. He has never lived outside of [place]. Went to [facility] for thanksgiving. No pets at home. Never smoker.   Interval HPI: Started on plaquenil last visit - held briefly till he could see an ophthalmologist due to new floaters. Saw Dr. Estanislado Pandy, continued on plaquenil.  Otherwise pertinent review of systems is negative.    Past Medical History:  Diagnosis Date   Alkaline phosphatase raised 06/28/2011   Fluctuating - suspect due to sarcoidosis   Allergic rhinitis    Allergy    Arthritis    knees   Genital herpes    Hemorrhoids    Hyperglycemia    Hyperplastic lymph node 06/27/2011   Submental node excised 1/02; regrowth: re-excision 10/06   Hypertension    Hypothyroidism    Multinodular goiter    Routine general medical examination at a health care facility    Sarcoidosis    Sleep apnea    wears cpap      Family History  Problem Relation Age of Onset   Hypertension Mother    Dementia Father     Sleep apnea Brother    Healthy Brother    Sleep apnea Paternal Aunt    Asthma Cousin    Healthy Daughter    Healthy Daughter    Sarcoidosis Neg Hx    Colon polyps Neg Hx    Colon cancer Neg Hx    Esophageal cancer Neg Hx    Stomach cancer Neg Hx    Rectal cancer Neg Hx      Past Surgical History:  Procedure Laterality Date   COLONOSCOPY  5-23/2003   COLONOSCOPY  02/04/2018   TA   COLONOSCOPY WITH PROPOFOL  05/26/2021   [facility] at [facility] Bilateral [id number]   THYROIDECTOMY  2002    Social History   Socioeconomic History   Marital status: Married    Spouse name: Not on file   Number of children: Not on file   Years of education: Not on file   Highest education level: Not on file  Occupational History   Not on file  Tobacco Use   Smoking status: Never    Passive exposure: Never   Smokeless tobacco: Never  Vaping Use   Vaping Use: Never used  Substance and Sexual Activity   Alcohol use: Yes    Comment: 3 times per month   Drug use: Never   Sexual activity: Not on file  Other Topics Concern  Not on file  Social History Narrative   Not on file   Social Determinants of Health   Financial Resource Strain: Low Risk  (04/04/2021)   Overall Financial Resource Strain (CARDIA)    Difficulty of Paying Living Expenses: Not hard at all  Food Insecurity: No Food Insecurity (04/04/2021)   Hunger Vital Sign    Worried About Running Out of Food in the Last Year: Never true    Ran Out of Food in the Last Year: Never true  Transportation Needs: No Transportation Needs (04/04/2021)   PRAPARE - Hydrologist (Medical): No    Lack of Transportation (Non-Medical): No  Physical Activity: Sufficiently Active (04/04/2021)   Exercise Vital Sign    Days of Exercise per Week: 3 days    Minutes of Exercise per Session: 60 min  Stress: No Stress Concern Present (04/04/2021)   [place]    Feeling of Stress : Not at all  Social Connections: Moderately Integrated (04/04/2021)   Social Connection and Isolation Panel [NHANES]    Frequency of Communication with Friends and Family: Twice a week    Frequency of Social Gatherings with Friends and Family: Twice a week    Attends Religious Services: More than 4 times per year    Active Member of [employer] or Organizations: No    Attends Archivist Meetings: Never    Marital Status: Married  Human resources officer Violence: Not At Risk (04/04/2021)   Humiliation, Afraid, Rape, and Kick questionnaire    Fear of Current or Ex-Partner: No    Emotionally Abused: No    Physically Abused: No    Sexually Abused: No     Allergies  Allergen Reactions   Other     SEAFOOD   Shellfish Allergy Swelling    Tongue and facial Swelling Tongue and facial Swelling     Outpatient Medications Prior to Visit  Medication Sig Dispense Refill   acetaminophen (TYLENOL) 650 MG CR tablet Take 650 mg by mouth every 8 (eight) hours as needed.     acyclovir (ZOVIRAX) 800 MG tablet Take 1 tablet (800 mg total) by mouth daily. 90 tablet 0   amLODipine (NORVASC) 10 MG tablet TAKE 1 TABLET BY MOUTH EVERY DAY 90 tablet 1   cholecalciferol (VITAMIN D3) 25 MCG (1000 UT) tablet Take 1,000 Units by mouth daily.     gabapentin (NEURONTIN) 300 MG capsule Take 1 capsule (300 mg total) by mouth 3 (three) times daily. 90 capsule 1   Glucosamine-Chondroit-Vit C-Mn (GLUCOSAMINE 1500 COMPLEX) CAPS Take by mouth daily.     hydroxychloroquine (PLAQUENIL) 200 MG tablet Take 2 tablets (400 mg total) by mouth daily. 60 tablet 4   levothyroxine (SYNTHROID) 200 MCG tablet Take 1 tablet (200 mcg total) by mouth daily. 90 tablet 3   Multiple Vitamin (MULTIVITAMIN WITH MINERALS) TABS tablet Take 1 tablet by mouth daily.     Multiple Vitamin (MULTIVITAMIN) LIQD Take 5 mLs by mouth daily.     Omega-3 Fatty Acids (FISH OIL) 1000 MG CAPS Take 2  capsules by mouth daily.     predniSONE (DELTASONE) 5 MG tablet TAKE 1 TABLET BY MOUTH EVERY DAY WITH BREAKFAST 30 tablet 3   sildenafil (REVATIO) 20 MG tablet TAKE 5 TABLETS AS DIRECTED 30-45 MINUTES PRIOR TO INTERCOURSE (Patient not taking: Reported on 10/04/2021) 50 tablet 5   tamsulosin (FLOMAX) 0.4 MG CAPS capsule TAKE 1 CAPSULE BY MOUTH EVERY DAY  90 capsule 1   Turmeric 500 MG TABS Take 1 tablet by mouth 2 (two) times daily.     valsartan (DIOVAN) 160 MG tablet Take 1 tablet (160 mg total) by mouth daily. 90 tablet 3   zinc gluconate 50 MG tablet Take 50 mg by mouth daily.     No facility-administered medications prior to visit.       Objective:   Physical Exam:  General appearance: 71 y.o., male, NAD, conversant  Eyes: anicteric sclerae; PERRL, tracking appropriately HENT: NCAT; MMM Neck: Trachea midline; no lymphadenopathy, no JVD Lungs: CTAB, no crackles, no wheeze, with normal respiratory effort CV: RRR, no murmur  Abdomen: Soft, non-tender; non-distended, BS present  Extremities: No peripheral edema, warm Skin: Normal turgor and texture; no rash Psych: Appropriate affect Neuro: Alert and oriented to person and place, no focal deficit     There were no vitals filed for this visit.     on RA BMI Readings from Last 3 Encounters:  10/17/21 31.17 kg/m  10/04/21 31.25 kg/m  09/30/21 31.54 kg/m   Wt Readings from Last 3 Encounters:  10/17/21 199 lb (90.3 kg)  10/04/21 199 lb 8 oz (90.5 kg)  09/30/21 201 lb 6.4 oz (91.4 kg)     CBC    Component Value Date/Time   WBC 8.8 09/30/2021 0908   RBC 4.57 09/30/2021 0908   HGB 13.9 09/30/2021 0908   HGB 15.5 06/23/2011 1637   HCT 41.9 09/30/2021 0908   HCT 45.9 06/23/2011 1637   PLT 294 09/30/2021 0908   PLT 218 06/23/2011 1637   MCV 91.7 09/30/2021 0908   MCV 91.1 06/23/2011 1637   MCH 30.4 09/30/2021 0908   MCHC 33.2 09/30/2021 0908   RDW 13.1 09/30/2021 0908   RDW 14.4 06/23/2011 1637   LYMPHSABS 2,341  09/30/2021 0908   LYMPHSABS 3.0 06/23/2011 1637   MONOABS 0.7 07/25/2021 1442   MONOABS 0.6 06/23/2011 1637   EOSABS 264 09/30/2021 0908   EOSABS 0.3 06/23/2011 1637   BASOSABS 70 09/30/2021 0908   BASOSABS 0.1 06/23/2011 1637    Chest Imaging: CXR 2018 reviewed by me with prominent hila, interstitium  CXR 05/31/21 reviewed by me with slightly more prominent interstitium   Echocardiogram:   Stress echo 2018:  NORMAL STRESS TEST. NORMAL RESTING STUDY WITH NO WALL MOTION ABNORMALITIES   AT REST AND PEAK STRESS.   NO DOPPLER PERFORMED FOR VALVULAR REGURGITATION   NO DOPPLER PERFORMED FOR VALVULAR STENOSIS   Note: BASELINE PROTOCOL   PATIENT WENT INTO SECOND DEGREE HEART BLOCK WHEN ENTERING RECOVERY FOLLOWED   BY A SHORT RUN OF JUNCTIONAL TACH. NO SYMPTOMS REPORTED   TTE 2018:   NORMAL LEFT VENTRICULAR SYSTOLIC FUNCTION WITH MILD LVH    NORMAL RIGHT VENTRICULAR SYSTOLIC FUNCTION    VALVULAR REGURGITATION: MILD AR, TRIVIAL MR, MILD PR, TRIVIAL TR    NO VALVULAR STENOSIS    PFT 05/31/21:  mild obstruction by FEV1/SVC otherwise normal    Assessment & Plan:   # Sarcoidosis: With likely sarcoid arthropathy and good response to prednisone. Has mild obstruction on PFT but not symptomatic from respiratory standpoint. Developed possible MTX-related transaminitis, medication discontinued.    # GERD: Some improvement with lifestyle measures  # OSA on CPAP: Adherent.  # Rhinitis:  Best symptomatic response to antihistamine.  Plan: - continue prednisone 5 mg daily - start plaquenil 400 mg daily - counseled that he needs eye exam within a year of starting plaquenil - referral has been placed  by sports med for rheum but could not get appt until 09/2021 - spirometry next visit - EKG due 02/2021 - has seen eye doctor this year - TLC measures including continued adherence to CPAP discussed to work on GERD which he'll try to implement, if failing he'll let us know so we can prescribe  ppi    RTC 3 months    Maryjane Hurter, MD [facility] 10/31/2021 5:05 PM

## 2021-11-01 ENCOUNTER — Ambulatory Visit: Payer: Medicare HMO | Attending: Rheumatology | Admitting: Rheumatology

## 2021-11-01 ENCOUNTER — Other Ambulatory Visit: Payer: Self-pay

## 2021-11-01 ENCOUNTER — Ambulatory Visit: Payer: Medicare HMO | Admitting: Student

## 2021-11-01 ENCOUNTER — Encounter: Payer: Self-pay | Admitting: Rheumatology

## 2021-11-01 VITALS — BP 148/75 | HR 84 | Resp 18 | Ht 67.0 in | Wt 200.8 lb

## 2021-11-01 DIAGNOSIS — Z79899 Other long term (current) drug therapy: Secondary | ICD-10-CM | POA: Diagnosis not present

## 2021-11-01 DIAGNOSIS — M5136 Other intervertebral disc degeneration, lumbar region: Secondary | ICD-10-CM

## 2021-11-01 DIAGNOSIS — Z8639 Personal history of other endocrine, nutritional and metabolic disease: Secondary | ICD-10-CM

## 2021-11-01 DIAGNOSIS — D869 Sarcoidosis, unspecified: Secondary | ICD-10-CM | POA: Diagnosis not present

## 2021-11-01 DIAGNOSIS — M1A0720 Idiopathic chronic gout, left ankle and foot, without tophus (tophi): Secondary | ICD-10-CM

## 2021-11-01 DIAGNOSIS — I10 Essential (primary) hypertension: Secondary | ICD-10-CM

## 2021-11-01 DIAGNOSIS — M5030 Other cervical disc degeneration, unspecified cervical region: Secondary | ICD-10-CM | POA: Diagnosis not present

## 2021-11-01 DIAGNOSIS — Z8709 Personal history of other diseases of the respiratory system: Secondary | ICD-10-CM

## 2021-11-01 DIAGNOSIS — N1831 Chronic kidney disease, stage 3a: Secondary | ICD-10-CM | POA: Diagnosis not present

## 2021-11-01 DIAGNOSIS — G4733 Obstructive sleep apnea (adult) (pediatric): Secondary | ICD-10-CM

## 2021-11-01 DIAGNOSIS — M791 Myalgia, unspecified site: Secondary | ICD-10-CM

## 2021-11-01 DIAGNOSIS — E559 Vitamin D deficiency, unspecified: Secondary | ICD-10-CM

## 2021-11-01 DIAGNOSIS — M2241 Chondromalacia patellae, right knee: Secondary | ICD-10-CM

## 2021-11-01 DIAGNOSIS — Z9989 Dependence on other enabling machines and devices: Secondary | ICD-10-CM

## 2021-11-01 DIAGNOSIS — K7581 Nonalcoholic steatohepatitis (NASH): Secondary | ICD-10-CM

## 2021-11-01 DIAGNOSIS — E042 Nontoxic multinodular goiter: Secondary | ICD-10-CM

## 2021-11-01 DIAGNOSIS — R5383 Other fatigue: Secondary | ICD-10-CM

## 2021-11-01 DIAGNOSIS — R748 Abnormal levels of other serum enzymes: Secondary | ICD-10-CM

## 2021-11-01 MED ORDER — PREDNISONE 1 MG PO TABS: 4.0000 mg | ORAL_TABLET | Freq: Every day | ORAL | 0 refills | Status: DC

## 2021-11-01 NOTE — Addendum Note (Signed)
Addended by: Francis Gaines C on: 11/01/2021 10:20 AM   Modules accepted: Orders

## 2021-11-01 NOTE — Progress Notes (Signed)
Please review and sign pended prednisone prescription. Thanks!

## 2021-11-01 NOTE — Patient Instructions (Signed)
Cervical Strain and Sprain Rehab Ask your health care provider which exercises are safe for you. Do exercises exactly as told by your health care provider and adjust them as directed. It is normal to feel mild stretching, pulling, tightness, or discomfort as you do these exercises. Stop right away if you feel sudden pain or your pain gets worse. Do not begin these exercises until told by your health care provider. Stretching and range-of-motion exercises Cervical side bending  Using good posture, sit on a stable chair or stand up. Without moving your shoulders, slowly tilt your left / right ear to your shoulder until you feel a stretch in the opposite side neck muscles. You should be looking straight ahead. Hold for __________ seconds. Repeat with the other side of your neck. Repeat __________ times. Complete this exercise __________ times a day. Cervical rotation  Using good posture, sit on a stable chair or stand up. Slowly turn your head to the side as if you are looking over your left / right shoulder. Keep your eyes level with the ground. Stop when you feel a stretch along the side and the back of your neck. Hold for __________ seconds. Repeat this by turning to your other side. Repeat __________ times. Complete this exercise __________ times a day. Thoracic extension and pectoral stretch  Roll a towel or a small blanket so it is about 4 inches (10 cm) in diameter. Lie down on your back on a firm surface. Put the towel in the middle of your back across your spine. It should not be under your shoulder blades. Put your hands behind your head and let your elbows fall out to your sides. Hold for __________ seconds. Repeat __________ times. Complete this exercise __________ times a day. Strengthening exercises Isometric upper cervical flexion  Lie on your back with a thin pillow behind your head and a small rolled-up towel under your neck. Gently tuck your chin toward your chest and  nod your head down to look toward your feet. Do not lift your head off the pillow. Hold for __________ seconds. Release the tension slowly. Relax your neck muscles completely before you repeat this exercise. Repeat __________ times. Complete this exercise __________ times a day. Isometric cervical extension  Stand about 6 inches (15 cm) away from a wall, with your back facing the wall. Place a soft object, about 6-8 inches (15-20 cm) in diameter, between the back of your head and the wall. A soft object could be a small pillow, a ball, or a folded towel. Gently tilt your head back and press into the soft object. Keep your jaw and forehead relaxed. Hold for __________ seconds. Release the tension slowly. Relax your neck muscles completely before you repeat this exercise. Repeat __________ times. Complete this exercise __________ times a day. Posture and body mechanics Body mechanics refers to the movements and positions of your body while you do your daily activities. Posture is part of body mechanics. Good posture and healthy body mechanics can help to relieve stress in your body's tissues and joints. Good posture means that your spine is in its natural S-curve position (your spine is neutral), your shoulders are pulled back slightly, and your head is not tipped forward. The following are general guidelines for applying improved posture and body mechanics to your everyday activities. Sitting  When sitting, keep your spine neutral and keep your feet flat on the floor. Use a footrest, if necessary, and keep your thighs parallel to the floor. Avoid rounding  your shoulders, and avoid tilting your head forward. When working at a desk or a computer, keep your desk at a height where your hands are slightly lower than your elbows. Slide your chair under your desk so you are close enough to maintain good posture. When working at a computer, place your monitor at a height where you are looking straight ahead  and you do not have to tilt your head forward or downward to look at the screen. Standing  When standing, keep your spine neutral and keep your feet about hip-width apart. Keep a slight bend in your knees. Your ears, shoulders, and hips should line up. When you do a task in which you stand in one place for a long time, place one foot up on a stable object that is 2-4 inches (5-10 cm) high, such as a footstool. This helps keep your spine neutral. Resting When lying down and resting, avoid positions that are most painful for you. Try to support your neck in a neutral position. You can use a contour pillow or a small rolled-up towel. Your pillow should support your neck but not push on it. This information is not intended to replace advice given to you by your health care provider. Make sure you discuss any questions you have with your health care provider. Document Revised: 01/17/2021 Document Reviewed: 01/17/2021 Elsevier Patient Education  [place].

## 2021-11-02 ENCOUNTER — Ambulatory Visit: Payer: Medicare HMO | Admitting: Student

## 2021-11-02 ENCOUNTER — Encounter: Payer: Self-pay | Admitting: Student

## 2021-11-02 VITALS — BP 142/76 | HR 82 | Ht 67.0 in | Wt 202.6 lb

## 2021-11-02 DIAGNOSIS — D869 Sarcoidosis, unspecified: Secondary | ICD-10-CM

## 2021-11-02 MED ORDER — HYDROXYCHLOROQUINE SULFATE 200 MG PO TABS: 400.0000 mg | ORAL_TABLET | Freq: Every day | ORAL | 11 refills | Status: DC

## 2021-11-02 NOTE — Patient Instructions (Signed)
-   spirometry in the office in March and clinic visit, see you then!

## 2021-11-07 LAB — ALDOLASE: Aldolase: 7.3 U/L (ref <=8.1)

## 2021-11-07 LAB — MYOSITIS SPECIFIC II ANTIBODIES PANEL
EJ AB: <11 SI (ref <11)
JO-1 AB: <11 SI (ref <11)
MDA-5 AB: <11 SI (ref <11)
MI-2 ALPHA AB: <11 SI (ref <11)
MI-2 BETA AB: <11 SI (ref <11)
NXP-2 AB: <11 SI (ref <11)
OJ AB: <11 SI (ref <11)
PL-12 AB: <11 SI (ref <11)
PL-7 AB: <11 SI (ref <11)
SRP-AB: <11 SI (ref <11)
TIF-1y AB: <11 SI (ref <11)

## 2021-11-07 LAB — CK: Total CK: 381 U/L — ABNORMAL HIGH (ref 44–196)

## 2021-11-07 LAB — HMGCR AB (IGG): HMGCR AB (IGG): <2 CU (ref <20)

## 2021-11-08 NOTE — Progress Notes (Signed)
Repeat CK is a still elevated but aldolase is normal and myositis panel is negative.  No further work-up needed.

## 2021-11-16 ENCOUNTER — Ambulatory Visit: Payer: Medicare HMO | Admitting: Family Medicine

## 2021-11-16 NOTE — Progress Notes (Unsigned)
  Philip Woodard - 71 y.o. male MRN [id number]  Date of birth: January 04, 1951  SUBJECTIVE:  Including CC & ROS.  No chief complaint on file.   Philip Woodard is a 71 y.o. male that is  ***.  ***   Review of Systems See HPI   HISTORY: Past Medical, Surgical, Social, and Family History Reviewed & Updated per EMR.   Pertinent Historical Findings include:  Past Medical History:  Diagnosis Date   Alkaline phosphatase raised 06/28/2011   Fluctuating - suspect due to sarcoidosis   Allergic rhinitis    Allergy    Arthritis    knees   Genital herpes    Hemorrhoids    Hyperglycemia    Hyperplastic lymph node 06/27/2011   Submental node excised 1/02; regrowth: re-excision 10/06   Hypertension    Hypothyroidism    Multinodular goiter    Routine general medical examination at a health care facility    Sarcoidosis    Sleep apnea    wears cpap     Past Surgical History:  Procedure Laterality Date   COLONOSCOPY  5-23/2003   COLONOSCOPY  02/04/2018   TA   COLONOSCOPY WITH PROPOFOL  05/26/2021   Lucio Edward at [place] Bilateral [id number]   THYROIDECTOMY  2002     PHYSICAL EXAM:  VS: There were no vitals taken for this visit. Physical Exam Gen: NAD, alert, cooperative with exam, well-appearing MSK: *** Neurovascularly intact       ASSESSMENT & PLAN:   No problem-specific Assessment & Plan notes found for this encounter.

## 2021-11-23 NOTE — Therapy (Signed)
OUTPATIENT PHYSICAL THERAPY CERVICAL EVALUATION   Patient Name: Philip Woodard MRN: [id number] DOB:1950/06/07, 71 y.o., male Today's Date: 11/25/2021   PT End of Session - 11/25/21 0908     Visit Number 1    Number of Visits 9    Date for PT Re-Evaluation 12/23/21    Authorization Type [employer]    Progress Note Due on Visit 10    PT Start Time 0915    PT Stop Time 1000    PT Time Calculation (min) 45 min    Activity Tolerance Patient tolerated treatment well    Behavior During Therapy [facility] for tasks assessed/performed             Past Medical History:  Diagnosis Date   Alkaline phosphatase raised 06/28/2011   Fluctuating - suspect due to sarcoidosis   Allergic rhinitis    Allergy    Arthritis    knees   Genital herpes    Hemorrhoids    Hyperglycemia    Hyperplastic lymph node 06/27/2011   Submental node excised 1/02; regrowth: re-excision 10/06   Hypertension    Hypothyroidism    Multinodular goiter    Routine general medical examination at a health care facility    Sarcoidosis    Sleep apnea    wears cpap    Past Surgical History:  Procedure Laterality Date   COLONOSCOPY  5-23/2003   COLONOSCOPY  02/04/2018   TA   COLONOSCOPY WITH PROPOFOL  05/26/2021   Lucio Edward at [place] Bilateral [id number]   THYROIDECTOMY  2002   Patient Active Problem List   Diagnosis Date Noted   Cervical radiculopathy 10/17/2021   Facial pain, acute 10/17/2021   Dyspepsia 07/20/2021   Lower urinary tract symptoms 07/20/2021   Cervical strain 04/27/2021   Chronic idiopathic gout involving toe of left foot without tophus 04/27/2021   Stage 3a chronic kidney disease ([place]) 03/31/2021   Erectile dysfunction due to arterial insufficiency 03/31/2021   NASH (nonalcoholic steatohepatitis) 02/22/2015   Arthritis of right lower extremity 07/20/2014   Hearing loss 02/19/2014   OSA on CPAP 05/02/2013   Hypothyroid 03/29/2012   Primary  hypertension 11/24/2008   HYPOTHYROIDISM, POSTSURGICAL 07/07/2008   HYPERTROPHY PROSTATE W/UR OBST & OTH LUTS 07/07/2008   [place] DISEASE, LUMBAR 12/27/2007   Sarcoidosis 06/06/2007   GOITER, MULTINODULAR 06/06/2007   HEMORRHOIDS 06/06/2007   ASTHMA 06/06/2007    PCP: Horald Pollen, MD  REFERRING PROVIDER: Bo Merino, MD  REFERRING DIAG: M50.30 (ICD-10-CM) - DDD (degenerative disc disease), cervical  THERAPY DIAG:  Cervicalgia - Plan: PT plan of care cert/re-cert  Abnormal posture - Plan: PT plan of care cert/re-cert  Rationale for Evaluation and Treatment Rehabilitation  ONSET DATE: about 2 months ago   SUBJECTIVE:  SUBJECTIVE STATEMENT: Pt notes symptoms initially started several months ago with neck/body pain, difficulty with mobility and getting out of bed. Pt states these initial symptoms gradually improved with medication, although over last two months he has noted numbness/discomfort in L neck, head, and eye that is provoked by looking towards R with eyes and with turning head towards R. Pt reports stiffness in L neck. Pt denies any overt limitations in daily activities although he notes that driving has become more difficult due to pain with turning head. In regards to other symptoms, pt denies any extremity numbness, headaches, visual changes, auditory changes, or difficulty swallowing. Pt does mention occasional metallic taste in mouth although he mentions this may be due to medications. He also notes that he has started falling out of bed, and his wife tells him he has become a more "active dreamer", talking and moving in sleep. Pt states that he is fairly active and typically goes to the gym 2-3x/week - states he took about 30 days off from gym per MD advice and since  he returned he feels his LUE is weaker. Pt mentions MD prescribed stretching exercises which do not seem to have changed pain.   PERTINENT HISTORY:  HTN, CKD, sarcoidosis  PAIN:  Are you having pain: no overt pain at rest, describes as stiff Location: L side of head, neck, and shoulder  How would you describe your pain? Sharp pain + some numbness/discomfort Best: 0/10 Worst: 8/10 Aggravating factors: pain with ocular movement towards R, pain with head movement towards R Easing factors: avoiding movement, rest. Symptoms ease quickly after onset  PRECAUTIONS: None  WEIGHT BEARING RESTRICTIONS No  FALLS:  Has patient fallen in last 6 months? Yes. 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 Alignment is normal. Multilevel degenerate disc disease with disc space narrowing and marginal osteophytes.   IMPRESSION: 1.  No evidence of acute fracture or subluxation.   2.  Mild multilevel degenerative disc disease of the cervical spine.  PATIENT SURVEYS:  FOTO 63   COGNITION: Overall cognitive status: Within functional limits for tasks assessed   SENSATION/NEURO: Light touch WNL and symmetrical UE/LE Unremarkable for dysdiadochokinesia testing Negative ataxia/tremors Consistent overshooting of L eye with ocular adduction during saccade testing Smooth pursuits unremarkable in all  directions Concordant L eye discomfort noted with convergence testing  POSTURE: rounded shoulders and forward head, excessive UT elevation  PALPATION: Palpable nontender tightness B upper trap, levator scap, and rhomboids.   CERVICAL ROM:   Active ROM A/PROM (deg) eval  Flexion 75%  Extension 75%  Right lateral flexion   Left lateral flexion   Right rotation 36  Left rotation 30   (Blank rows = not tested) Comments: stiffness L side with flex/ext, no pain. Nonpainful stretching on L side with B rotation  UPPER EXTREMITY ROM:  Active ROM Right eval Left eval  Shoulder flexion [facility]  Shoulder abduction  Shoulder internal rotation    Shoulder external rotation    Elbow flexion    Elbow extension    Wrist flexion    Wrist extension     (Blank rows = not tested) Comments: painless shoulder elevation  UPPER EXTREMITY MMT:  MMT Right eval Left eval  Shoulder flexion 5 5  Shoulder extension    Shoulder abduction 5 5  Shoulder extension    Shoulder internal rotation 5 5  Shoulder external rotation 5 5  Elbow flexion 5 5  Elbow extension 5 5  Grip strength     (Blank rows = not tested)  Comments: no pain with above  CERVICAL SPECIAL TESTS:  Spurling's test: Positive and Sharp pursor's test: Negative   Sharps Pursor test: unremarkable   Spurlings test: Concordant pain reproduced with lateral flexion + pressure BIL   TODAY'S TREATMENT:  [facility] Adult PT Treatment:                                                DATE: 11/25/2021  Therapeutic Exercise: Seated chin tucks x10, cues for form and posture Seated scapular retraction x10, cues for form, upright posture, reduced upper trap compensation  Manual Therapy: Trigger point release L levator scap and upper trap with active cervical flexion followed by rotation      PATIENT EDUCATION:  Education details: Pt education on PT impairments, prognosis, and POC. Rationale for interventions, safe/appropriate HEP  performance. Discussed examination findings and plan to discuss with referring MD Person educated: Patient Education method: Explanation, Demonstration, Tactile cues, Verbal cues, and Handouts Education comprehension: verbalized understanding, returned demonstration, verbal cues required, tactile cues required, and needs further education    HOME EXERCISE PROGRAM: Access Code: [email or web address] URL: [email or web address] Date: 11/25/2021 Prepared by: Enis Slipper  Exercises - Seated Cervical Retraction  - 1 x daily - 7 x weekly - 3 sets - 10 reps - Seated Scapular Retraction  - 1 x daily - 7 x weekly - 3 sets - 10 reps  ASSESSMENT:  CLINICAL IMPRESSION: Patient is a pleasant 71 y.o. gentleman who was seen today for physical therapy evaluation and treatment for ongoing neck/head/eye pain. Pt denies overt limitations in daily activities but does report pain/difficulty with turning head which has caused him to move more cautiously and increases difficulty with driving. 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 On today's examination pt demonstrates significant limitations in cervical mobility that improve after trigger point release + active movement. Concordant symptoms provoked by both cervical and ocular movement. Neuro examination as described above mostly unremarkable aside from overshooting of L eye with ocular adduction during saccade testing and reproduction of L eye pain with convergence testing - messaged referring MD re: these findings via secure chat as pt may benefit from further workup or referral to neuro PT for more thorough vestibular assessment. In regards to cervical pain, pt currently limited by muscular tightness of paracervical/periscapular musculature and postural deficits. Recommend skilled PT to address these deficits to improve pain levels, quality of life, and environmental safety/awareness. Pt departs  today's session in no acute distress, all voiced questions/concerns addressed appropriately from PT perspective.     OBJECTIVE IMPAIRMENTS decreased mobility, decreased ROM, postural dysfunction, and pain.   ACTIVITY LIMITATIONS reach over head  PARTICIPATION LIMITATIONS: driving and community activity  PERSONAL FACTORS Time since onset of injury/illness/exacerbation and 3+ comorbidities: HTN, CKD, sarcoidosis  are also affecting patient's functional outcome.   REHAB POTENTIAL: Good  CLINICAL DECISION MAKING: Evolving/moderate complexity  EVALUATION COMPLEXITY: Moderate   GOALS: Goals reviewed with patient? Yes  SHORT TERM GOALS: Target date: 12/09/2021   Pt will demonstrate appropriate understanding and performance of initially prescribed HEP in order to facilitate improved independence with management of symptoms.  Baseline: HEP provided on eval Goal status: INITIAL   2. Pt will score greater than or equal to 65 on FOTO in order to demonstrate improved perception of function due to symptoms.  Baseline: 63  Goal status: INITIAL    LONG TERM GOALS: Target date: 12/23/2021  Pt will score 68 on FOTO in order to demonstrate improved perception of function due to symptoms. Baseline: 63 Goal status: INITIAL  Pt will demonstrate at least 60 degrees of active cervical rotation ROM with less than 3/10 pain on NPS in order to demonstrate improved environmental awareness and safety with driving.  Baseline: 30 to L, 36 towards R Goal status: INITIAL  3. Pt will report/demonstrate ability to look towards R with less than 2 point increase on NPS in order to demonstrate improved safety and environmental awareness.   Baseline: up to 8/10 reported  Goal status: INITIAL    PLAN: PT FREQUENCY: 2x/week  PT DURATION: 4 weeks  PLANNED INTERVENTIONS: Therapeutic exercises, Therapeutic activity, Neuromuscular re-education, Balance training, Gait training, Patient/Family education, Self  Care, Joint mobilization, Vestibular training, Canalith repositioning, Aquatic Therapy, Dry Needling, Spinal mobilization, Cryotherapy, Moist heat, Manual therapy, and Re-evaluation  PLAN FOR NEXT SESSION: Progress ROM/strengthening/stability exercises as able/appropriate, review/modify HEP as appropriate.    Leeroy Cha PT, DPT 11/25/2021 11:34 AM

## 2021-11-24 DIAGNOSIS — M5416 Radiculopathy, lumbar region: Secondary | ICD-10-CM | POA: Diagnosis not present

## 2021-11-24 DIAGNOSIS — M5136 Other intervertebral disc degeneration, lumbar region: Secondary | ICD-10-CM | POA: Diagnosis not present

## 2021-11-24 DIAGNOSIS — M9903 Segmental and somatic dysfunction of lumbar region: Secondary | ICD-10-CM | POA: Diagnosis not present

## 2021-11-25 ENCOUNTER — Ambulatory Visit: Payer: Medicare HMO | Attending: Rheumatology | Admitting: Physical Therapy

## 2021-11-25 ENCOUNTER — Other Ambulatory Visit: Payer: Self-pay

## 2021-11-25 ENCOUNTER — Encounter: Payer: Self-pay | Admitting: Physical Therapy

## 2021-11-25 DIAGNOSIS — M5030 Other cervical disc degeneration, unspecified cervical region: Secondary | ICD-10-CM | POA: Diagnosis not present

## 2021-11-25 DIAGNOSIS — R293 Abnormal posture: Secondary | ICD-10-CM | POA: Diagnosis not present

## 2021-11-25 DIAGNOSIS — M542 Cervicalgia: Secondary | ICD-10-CM | POA: Insufficient documentation

## 2021-12-01 ENCOUNTER — Encounter: Payer: Self-pay | Admitting: Family Medicine

## 2021-12-01 ENCOUNTER — Ambulatory Visit: Payer: Medicare HMO | Admitting: Family Medicine

## 2021-12-01 DIAGNOSIS — M5412 Radiculopathy, cervical region: Secondary | ICD-10-CM | POA: Diagnosis not present

## 2021-12-01 DIAGNOSIS — R519 Headache, unspecified: Secondary | ICD-10-CM | POA: Diagnosis not present

## 2021-12-01 NOTE — Assessment & Plan Note (Deleted)
Has gotten improvement.  Unclear if his pain is completely associated with the cervical spine versus facial nerve.  He is feeling much improved. -Counseled on home exercise therapy and supportive care. -Continue physical therapy. -Follow-up as needed.

## 2021-12-01 NOTE — Assessment & Plan Note (Signed)
Has gotten improvement.  No longer having the pain on the left side of his face. -Counseled on home exercise therapy and supportive care. -Continue physical therapy. -Follow-up as needed.

## 2021-12-01 NOTE — Assessment & Plan Note (Signed)
Doing well with no current radicular type pain down his left arm. -Counseled on home exercise therapy and supportive care -Continue physical therapy.

## 2021-12-01 NOTE — Progress Notes (Signed)
  Philip Woodard - 71 y.o. male MRN [id number]  Date of birth: Jun 08, 1950  SUBJECTIVE:  Including CC & ROS.  No chief complaint on file.   Philip Woodard is a 71 y.o. male that is following up for his neck and facial pain.  He has done well with and no longer having significant pain.  He has started physical therapy.   Review of Systems See HPI   HISTORY: Past Medical, Surgical, Social, and Family History Reviewed & Updated per EMR.   Pertinent Historical Findings include:  Past Medical History:  Diagnosis Date   Alkaline phosphatase raised 06/28/2011   Fluctuating - suspect due to sarcoidosis   Allergic rhinitis    Allergy    Arthritis    knees   Genital herpes    Hemorrhoids    Hyperglycemia    Hyperplastic lymph node 06/27/2011   Submental node excised 1/02; regrowth: re-excision 10/06   Hypertension    Hypothyroidism    Multinodular goiter    Routine general medical examination at a health care facility    Sarcoidosis    Sleep apnea    wears cpap     Past Surgical History:  Procedure Laterality Date   COLONOSCOPY  5-23/2003   COLONOSCOPY  02/04/2018   TA   COLONOSCOPY WITH PROPOFOL  05/26/2021   Lucio Edward at [place] Bilateral [id number]   THYROIDECTOMY  2002     PHYSICAL EXAM:  VS: Ht 5' 7"  (1.702 m)   Wt 202 lb (91.6 kg)   BMI 31.64 kg/m  Physical Exam Gen: NAD, alert, cooperative with exam, well-appearing MSK:  Neurovascularly intact       ASSESSMENT & PLAN:   Facial pain, acute Has gotten improvement.  No longer having the pain on the left side of his face. -Counseled on home exercise therapy and supportive care. -Continue physical therapy. -Follow-up as needed.  Cervical radiculopathy Doing well with no current radicular type pain down his left arm. -Counseled on home exercise therapy and supportive care -Continue physical therapy.

## 2021-12-02 ENCOUNTER — Telehealth: Payer: Self-pay | Admitting: *Deleted

## 2021-12-02 ENCOUNTER — Other Ambulatory Visit: Payer: Self-pay | Admitting: Emergency Medicine

## 2021-12-02 DIAGNOSIS — H519 Unspecified disorder of binocular movement: Secondary | ICD-10-CM

## 2021-12-02 NOTE — Therapy (Signed)
OUTPATIENT PHYSICAL THERAPY TREATMENT NOTE   Patient Name: Philip Woodard MRN: [id number] DOB:Oct 16, 1950, 71 y.o., male Today's Date: 12/05/2021  PCP: Horald Pollen, MD   REFERRING PROVIDER: Bo Merino, MD  END OF SESSION:   PT End of Session - 12/05/21 1015     Visit Number 2    Number of Visits 9    Date for PT Re-Evaluation 12/23/21    Authorization Type [employer]    Progress Note Due on Visit 10    PT Start Time 1015    PT Stop Time 1056    PT Time Calculation (min) 41 min    Activity Tolerance Patient tolerated treatment well    Behavior During Therapy WFL for tasks assessed/performed             Past Medical History:  Diagnosis Date   Alkaline phosphatase raised 06/28/2011   Fluctuating - suspect due to sarcoidosis   Allergic rhinitis    Allergy    Arthritis    knees   Genital herpes    Hemorrhoids    Hyperglycemia    Hyperplastic lymph node 06/27/2011   Submental node excised 1/02; regrowth: re-excision 10/06   Hypertension    Hypothyroidism    Multinodular goiter    Routine general medical examination at a health care facility    Sarcoidosis    Sleep apnea    wears cpap    Past Surgical History:  Procedure Laterality Date   COLONOSCOPY  5-23/2003   COLONOSCOPY  02/04/2018   TA   COLONOSCOPY WITH PROPOFOL  05/26/2021   [facility] [id number]   THYROIDECTOMY  2002   Patient Active Problem List   Diagnosis Date Noted   Cervical radiculopathy 10/17/2021   Facial pain, acute 10/17/2021   Dyspepsia 07/20/2021   Lower urinary tract symptoms 07/20/2021   Cervical strain 04/27/2021   Chronic idiopathic gout involving toe of left foot without tophus 04/27/2021   Stage 3a chronic kidney disease ([place]) 03/31/2021   Erectile dysfunction due to arterial insufficiency 03/31/2021   NASH (nonalcoholic steatohepatitis) 02/22/2015   Arthritis of right lower extremity 07/20/2014    Hearing loss 02/19/2014   OSA on CPAP 05/02/2013   Hypothyroid 03/29/2012   Primary hypertension 11/24/2008   HYPOTHYROIDISM, POSTSURGICAL 07/07/2008   HYPERTROPHY PROSTATE W/UR OBST & OTH LUTS 07/07/2008   [place] DISEASE, LUMBAR 12/27/2007   Sarcoidosis 06/06/2007   GOITER, MULTINODULAR 06/06/2007   HEMORRHOIDS 06/06/2007   ASTHMA 06/06/2007    REFERRING DIAG: M50.30 (ICD-10-CM) - DDD (degenerative disc disease), cervical  THERAPY DIAG:  Cervicalgia  Abnormal posture  Rationale for Evaluation and Treatment Rehabilitation  PERTINENT HISTORY: HTN, CKD, sarcoidosis  PRECAUTIONS: none  SUBJECTIVE: Pt arrives and reports good compliance with HEP, no issues. Denies any overt change in pain since initial evaluation. Denies any pain at present, primary report of stiffness. Pt states he has spoken with sports med MD and referring physician and both encouraged continued participation in PT. No other new updates.   PAIN:  Are you having pain: no overt pain at rest, describes as stiff Location: L side of head, neck, and shoulder  How would you describe your pain? Sharp pain + some numbness/discomfort Best: 0/10 Worst: 8/10 Aggravating factors: pain with ocular movement towards R, pain with head movement towards R Easing factors: avoiding movement, rest. Symptoms ease quickly after onset   OBJECTIVE: (objective measures completed at initial evaluation unless  otherwise dated)   DIAGNOSTIC FINDINGS:  Cervical x ray 04/27/21 FINDINGS: There is no evidence of cervical spine fracture or prevertebral soft tissue swelling. Alignment is normal. Multilevel degenerate disc disease with disc space narrowing and marginal osteophytes.   IMPRESSION: 1.  No evidence of acute fracture or subluxation.   2.  Mild multilevel degenerative disc disease of the cervical spine.   PATIENT SURVEYS:  FOTO 63     COGNITION: Overall cognitive status: Within functional limits for tasks assessed      SENSATION/NEURO: Light touch WNL and symmetrical UE/LE Unremarkable for dysdiadochokinesia testing Negative ataxia/tremors Consistent overshooting of L eye with ocular adduction during saccade testing Smooth pursuits unremarkable in all directions Concordant L eye discomfort noted with convergence testing   POSTURE: rounded shoulders and forward head, excessive UT elevation   PALPATION: Palpable nontender tightness B upper trap, levator scap, and rhomboids.      CERVICAL ROM:    Active ROM A/PROM (deg) eval  Flexion 75%  Extension 75%  Right lateral flexion    Left lateral flexion    Right rotation 36  Left rotation 30   (Blank rows = not tested) Comments: stiffness L side with flex/ext, no pain. Nonpainful stretching on L side with B rotation   UPPER EXTREMITY ROM:   Active ROM Right eval Left eval  Shoulder flexion [facility] [place]  Shoulder abduction      Shoulder internal rotation      Shoulder external rotation      Elbow flexion      Elbow extension      Wrist flexion      Wrist extension       (Blank rows = not tested) Comments: painless shoulder elevation   UPPER EXTREMITY MMT:   MMT Right eval Left eval  Shoulder flexion 5 5  Shoulder extension      Shoulder abduction 5 5  Shoulder extension      Shoulder internal rotation 5 5  Shoulder external rotation 5 5  Elbow flexion 5 5  Elbow extension 5 5  Grip strength       (Blank rows = not tested)          Comments: no pain with above   CERVICAL SPECIAL TESTS:  Spurling's test: Positive and Sharp pursor's test: Negative                       Sharps Pursor test: unremarkable                       Spurlings test: Concordant pain reproduced with lateral flexion + pressure BIL     TODAY'S TREATMENT:  [facility] Adult PT Treatment:                                                DATE: 12/05/21 Therapeutic Exercise: Thoracic extensions, standing at wall with foam roller, 4x10, cues for comfortable ROM Swiss ball  flexion at wall, x15 cues for form and pacing Manual Therapy: Trigger point release L levator scap/upper trap with active cervical flexion/rotation STM paracervicals/periscapular musculature Neuromuscular re-ed: Seated chin tucks x 10 Standing chin tucks w/ ball at wall 2x10, for improved DNF activation, cues for form and reduced compensations Cable column rows wide rows 10# BUE 2x15 for improved periscapular activation, cues for reduced compensations at  upper traps Cable column tall kneeling high>low rows 13#, 2x10 unilat but performed bilat for improved lat activation, cues for appropriate posture and form   [facility] Adult PT Treatment:                                                DATE: 11/25/2021  Therapeutic Exercise: Seated chin tucks x10, cues for form and posture Seated scapular retraction x10, cues for form, upright posture, reduced upper trap compensation   Manual Therapy: Trigger point release L levator scap and upper trap with active cervical flexion followed by rotation                   PATIENT EDUCATION:  Education details: rationale for interventions, discussed pt's gym activities (more upper body pressing than pulling) discussed appropriate modifications given excessive kyphosis Person educated: Patient Education method: Explanation, Demonstration, Tactile cues, Verbal cues Education comprehension: verbalized understanding, returned demonstration, verbal cues required, tactile cues required, and needs further education      HOME EXERCISE PROGRAM: Access Code: [email or web address] URL: [email or web address] Date: 11/25/2021 Prepared by: Enis Slipper   Exercises - Seated Cervical Retraction  - 1 x daily - 7 x weekly - 3 sets - 10 reps - Seated Scapular Retraction  - 1 x daily - 7 x weekly - 3 sets - 10 reps   ASSESSMENT:   CLINICAL IMPRESSION: Patient is a pleasant 71 y.o. gentleman who was seen today for physical therapy evaluation and treatment for ongoing  neck/head/eye pain. Today's session able to progress for increased volume with cervical/periscapular exercises with emphasis on improving tissue extensibility and neuromuscular control. Pt continues to report reduced stiffness post manual and increased cervical ROM. Pt requires frequent cues for improved truncal extension and upright posture as he demonstrates strong tendency for kyphosis. Pt continues to demonstrate postural deficits and limitations in cervicothoracic mobility. Departs with report of significantly improved stiffness and denies any pain or adverse symptoms throughout session.   Recommend skilled PT to address aforementioned deficits to maximize functional independence and tolerance. Pt departs today's session in no acute distress, all voiced questions/concerns addressed appropriately from PT perspective.         OBJECTIVE IMPAIRMENTS decreased mobility, decreased ROM, postural dysfunction, and pain.    ACTIVITY LIMITATIONS reach over head   PARTICIPATION LIMITATIONS: driving and community activity   PERSONAL FACTORS Time since onset of injury/illness/exacerbation and 3+ comorbidities: HTN, CKD, sarcoidosis  are also affecting patient's functional outcome.    REHAB POTENTIAL: Good   CLINICAL DECISION MAKING: Evolving/moderate complexity   EVALUATION COMPLEXITY: Moderate     GOALS: Goals reviewed with patient? Yes   SHORT TERM GOALS: Target date: 12/09/2021    Pt will demonstrate appropriate understanding and performance of initially prescribed HEP in order to facilitate improved independence with management of symptoms.  Baseline: HEP provided on eval Goal status: INITIAL    2. Pt will score greater than or equal to 65 on FOTO in order to demonstrate improved perception of function due to symptoms.            Baseline: 63            Goal status: INITIAL      LONG TERM GOALS: Target date: 12/23/2021   Pt will score 68 on FOTO in order to demonstrate improved  perception of function due to symptoms. Baseline:  63 Goal status: INITIAL   Pt will demonstrate at least 60 degrees of active cervical rotation ROM with less than 3/10 pain on NPS in order to demonstrate improved environmental awareness and safety with driving.  Baseline: 30 to L, 36 towards R Goal status: INITIAL   3. Pt will report/demonstrate ability to look towards R with less than 2 point increase on NPS in order to demonstrate improved safety and environmental awareness.             Baseline: up to 8/10 reported            Goal status: INITIAL       PLAN: PT FREQUENCY: 2x/week   PT DURATION: 4 weeks   PLANNED INTERVENTIONS: Therapeutic exercises, Therapeutic activity, Neuromuscular re-education, Balance training, Gait training, Patient/Family education, Self Care, Joint mobilization, Vestibular training, Canalith repositioning, Aquatic Therapy, Dry Needling, Spinal mobilization, Cryotherapy, Moist heat, Manual therapy, and Re-evaluation   PLAN FOR NEXT SESSION: Progress ROM/strengthening/stability exercises as able/appropriate, review/modify HEP as appropriate.       Leeroy Cha PT, DPT 12/05/2021 10:58 AM

## 2021-12-02 NOTE — Telephone Encounter (Signed)
-----   Message from Bo Merino, MD sent at 12/01/2021  4:18 PM EDT ----- Regarding: RE: PT Evaluation Abnormal eye movement Bo Merino, MD  ----- Message ----- From: Carole Binning, LPN Sent: 5/44/9201  10:37 AM EDT To: Bo Merino, MD Subject: RE: PT Evaluation                              Please verify what diagnosis to associate referral with. Thanks! ----- Message ----- From: Bo Merino, MD Sent: 11/25/2021   2:45 PM EDT To: Carole Binning, LPN; Leeroy Cha, PT Subject: RE: PT Evaluation                              Shanon Brow,  I appreciate your detailed note.  I called patient and discussed his current symptoms.  We will make a referral to a neurologist.   Seth Bake, Please refer patient to neurology.  Thank you, Bo Merino, MD  ----- Message ----- From: Leeroy Cha, PT Sent: 11/25/2021  11:04 AM EDT To: Bo Merino, MD Subject: PT Evaluation                                  Good morning Dr. Estanislado Pandy,  I'm the physical therapist who evaluated this gentleman today, thank you for the referral. I just wanted to reach out regarding some of his examination findings.   His primary complaint was of left sided eye/head numbness and discomfort when looking towards the right, provoked by both cervical and ocular movement. He also described sharp neck pain on the L side with cervical movement. During his examination, I did a brief neurological screen. This was mostly unremarkable although he did appear to have overshooting on the left eye with ocular adduction during smooth pursuit testing, and reproduction of left eye pain with convergence testing. He denied any dizziness, nausea, or other symptoms during the ocular screen but these signs may be suggestive of vestibular dysfunction. I'll admit I don't have the strongest background in vestibular assessment/treatment, so he may benefit from a referral to our neuro clinic to assess his vestibular  function more thoroughly. I also wanted to make you aware of these signs in case you feel there is any other further work up he may benefit from.  He did have an improvement in cervical mobility today after treatment, so I feel comfortable continuing to treat him in that regard. Just wanted to keep you in the loop regarding the examination findings. Please let me know if you have any questions or concerns, I'm happy to help however I can.  Thank you!  Respectfully, Enis Slipper, PT DPT

## 2021-12-05 ENCOUNTER — Encounter: Payer: Self-pay | Admitting: Physical Therapy

## 2021-12-05 ENCOUNTER — Ambulatory Visit: Payer: Medicare HMO | Admitting: Physical Therapy

## 2021-12-05 DIAGNOSIS — M542 Cervicalgia: Secondary | ICD-10-CM | POA: Diagnosis not present

## 2021-12-05 DIAGNOSIS — M5030 Other cervical disc degeneration, unspecified cervical region: Secondary | ICD-10-CM | POA: Diagnosis not present

## 2021-12-05 DIAGNOSIS — R293 Abnormal posture: Secondary | ICD-10-CM | POA: Diagnosis not present

## 2021-12-06 ENCOUNTER — Encounter: Payer: Self-pay | Admitting: Gastroenterology

## 2021-12-06 ENCOUNTER — Ambulatory Visit: Payer: Medicare HMO | Admitting: Gastroenterology

## 2021-12-06 VITALS — BP 124/72 | HR 87 | Ht 67.0 in | Wt 204.0 lb

## 2021-12-06 DIAGNOSIS — Z86010 Personal history of colonic polyps: Secondary | ICD-10-CM | POA: Diagnosis not present

## 2021-12-06 DIAGNOSIS — K219 Gastro-esophageal reflux disease without esophagitis: Secondary | ICD-10-CM | POA: Diagnosis not present

## 2021-12-06 DIAGNOSIS — R1310 Dysphagia, unspecified: Secondary | ICD-10-CM

## 2021-12-06 NOTE — Progress Notes (Signed)
    Assessment     GERD with solid food dysphagia.  R/O esophageal stricture, esophagitis, Barrett's Choking and coughing with liquids. R/O oropharyngeal dysphagia Personal history of adenomatous colon polyps   Recommendations    Follow antireflux measures and continue Tums as needed for now Schedule EGD with possible dilation. The risks (including bleeding, perforation, infection, missed lesions, medication reactions and possible hospitalization or surgery if complications occur), benefits, and alternatives to endoscopy with possible biopsy and possible dilation were discussed with the patient and they consent to proceed.   If oropharyngeal dysphagia symptoms persist consider BA esophagram and MBSS Surveillance colonoscopy recommended in March 2030   HPI    This is a 71 year old male with GERD and intermittent dysphagia for about 1 year.  He notes reflux symptoms occurring about once per week and symptoms are controlled with Tums.  He has intermittent difficulties with solid food dysphagia and also notes frequent problems with coughing and choking when swallowing liquids. No prior EGD or esophagram. Denies weight loss, abdominal pain, constipation, diarrhea, change in stool caliber, melena, hematochezia, nausea, vomiting,  chest pain.  Colonoscopy Mar 2023 - Perianal skin tags found on perianal exam. - One 6 mm polyp in the sigmoid colon, removed with a cold snare. Resected and retrieved. - Internal hemorrhoids. - The examination was otherwise normal on direct and retroflexion views.   Labs / Imaging       Latest Ref Rng & Units 09/30/2021    9:08 AM 07/25/2021    2:42 PM 07/08/2021   12:02 PM  Hepatic Function  Total Protein 6.1 - 8.1 g/dL 6.8  7.0  6.6   Albumin 3.5 - 5.2 g/dL  4.3  4.1   AST 10 - 35 U/L 23  39  76   ALT 9 - 46 U/L 16  25  44   Alk Phosphatase 39 - 117 U/L  62  60   Total Bilirubin 0.2 - 1.2 mg/dL 0.6  0.7  0.7        Latest Ref Rng & Units 09/30/2021     9:08 AM 07/25/2021    2:42 PM 07/08/2021   12:02 PM  CBC  WBC 3.8 - 10.8 Thousand/uL 8.8  8.5  9.0   Hemoglobin 13.2 - 17.1 g/dL 13.9  12.6  12.4   Hematocrit 38.5 - 50.0 % 41.9  37.8  37.0   Platelets 140 - 400 Thousand/uL 294  266.0  248.0     Current Medications, Allergies, Past Medical History, Past Surgical History, Family History and Social History were reviewed in [employer] record.   Physical Exam: General: Well developed, well nourished, no acute distress Head: Normocephalic and atraumatic Eyes: Sclerae anicteric, EOMI Ears: Normal auditory acuity Mouth: Not examined Lungs: Clear throughout to auscultation Heart: Regular rate and rhythm; no murmurs, rubs or bruits Abdomen: Soft, non tender and non distended. No masses, hepatosplenomegaly or hernias noted. Normal Bowel sounds Rectal: Not done Musculoskeletal: Symmetrical with no gross deformities  Pulses:  Normal pulses noted Extremities: No clubbing, cyanosis, edema or deformities noted Neurological: Alert oriented x 4, grossly nonfocal Psychological:  Alert and cooperative. Normal mood and affect   Chloe Flis T. Fuller Plan, MD 12/06/2021, 9:19 AM

## 2021-12-06 NOTE — Patient Instructions (Signed)
You have been scheduled for an endoscopy. Please follow written instructions given to you at your visit today. If you use inhalers (even only as needed), please bring them with you on the day of your procedure.  Patient advised to avoid spicy, acidic, citrus, chocolate, mints, fruit and fruit juices.  Limit the intake of caffeine, alcohol and Soda.  Don't exercise too soon after eating.  Don't lie down within 3-4 hours of eating.  Elevate the head of your bed.  The [facility] providers would like to encourage you to use [facility] to communicate with providers for non-urgent requests or questions.  Due to long hold times on the telephone, sending your provider a message by [facility] may be a faster and more efficient way to get a response.  Please allow 48 business hours for a response.  Please remember that this is for non-urgent requests.   Due to recent changes in healthcare laws, you may see the results of your imaging and laboratory studies on MyChart before your provider has had a chance to review them.  We understand that in some cases there may be results that are confusing or concerning to you. Not all laboratory results come back in the same time frame and the provider may be waiting for multiple results in order to interpret others.  Please give [place] 48 hours in order for your provider to thoroughly review all the results before contacting the office for clarification of your results.   Thank you for choosing me and [facility].  Pricilla Riffle. Dagoberto Ligas., MD., Marval Regal

## 2021-12-08 NOTE — Therapy (Signed)
OUTPATIENT PHYSICAL THERAPY TREATMENT NOTE   Patient Name: Philip Woodard MRN: [id number] DOB:October 01, 1950, 71 y.o., male Today's Date: 12/09/2021  PCP: Horald Pollen, MD   REFERRING PROVIDER: Bo Merino, MD  END OF SESSION:   PT End of Session - 12/09/21 0931     Visit Number 3    Number of Visits 9    Date for PT Re-Evaluation 12/23/21    Authorization Type [employer]    Progress Note Due on Visit 10    PT Start Time 0931    PT Stop Time 1012    PT Time Calculation (min) 41 min    Activity Tolerance Patient tolerated treatment well    Behavior During Therapy [facility] for tasks assessed/performed              Past Medical History:  Diagnosis Date   Alkaline phosphatase raised 06/28/2011   Fluctuating - suspect due to sarcoidosis   Allergic rhinitis    Allergy    Arthritis    knees   Genital herpes    Hemorrhoids    Hyperglycemia    Hyperplastic lymph node 06/27/2011   Submental node excised 1/02; regrowth: re-excision 10/06   Hypertension    Hypothyroidism    Multinodular goiter    Routine general medical examination at a health care facility    Sarcoidosis    Sleep apnea    wears cpap    Past Surgical History:  Procedure Laterality Date   COLONOSCOPY  5-23/2003   COLONOSCOPY  02/04/2018   TA   COLONOSCOPY WITH PROPOFOL  05/26/2021   [facility] at [place] Bilateral [id number]   THYROIDECTOMY  2002   Patient Active Problem List   Diagnosis Date Noted   Cervical radiculopathy 10/17/2021   Facial pain, acute 10/17/2021   Dyspepsia 07/20/2021   Lower urinary tract symptoms 07/20/2021   Cervical strain 04/27/2021   Chronic idiopathic gout involving toe of left foot without tophus 04/27/2021   Stage 3a chronic kidney disease ([place]) 03/31/2021   Erectile dysfunction due to arterial insufficiency 03/31/2021   NASH (nonalcoholic steatohepatitis) 02/22/2015   Arthritis of right lower extremity 07/20/2014    Hearing loss 02/19/2014   OSA on CPAP 05/02/2013   Hypothyroid 03/29/2012   Primary hypertension 11/24/2008   HYPOTHYROIDISM, POSTSURGICAL 07/07/2008   HYPERTROPHY PROSTATE W/UR OBST & OTH LUTS 07/07/2008   [place] DISEASE, LUMBAR 12/27/2007   Sarcoidosis 06/06/2007   GOITER, MULTINODULAR 06/06/2007   HEMORRHOIDS 06/06/2007   ASTHMA 06/06/2007    REFERRING DIAG: M50.30 (ICD-10-CM) - DDD (degenerative disc disease), cervical  THERAPY DIAG:  Cervicalgia  Abnormal posture  Rationale for Evaluation and Treatment Rehabilitation  PERTINENT HISTORY: HTN, CKD, sarcoidosis  PRECAUTIONS: none  SUBJECTIVE:  Pt arrives with no pain or stiffness, states he has not had any sharp pains since last session. Reports HEP going well and he has been doing more pulling movements rather than pushing movements at gym and that seems to have helped. States he still has some facial/eye discomfort but improving since eval. No other new reports.    PAIN:  Are you having pain: no overt pain at rest, describes as stiff Location: L side of head, neck, and shoulder  How would you describe your pain? Sharp pain + some numbness/discomfort Best: 0/10 Worst: 0/10 Aggravating factors: pain with ocular movement towards R, pain with head movement towards R Easing factors: avoiding movement, rest. Symptoms ease quickly after onset  OBJECTIVE: (objective measures completed at initial evaluation unless otherwise dated)   DIAGNOSTIC FINDINGS:  Cervical x ray 04/27/21 FINDINGS: There is no evidence of cervical spine fracture or prevertebral soft tissue swelling. Alignment is normal. Multilevel degenerate disc disease with disc space narrowing and marginal osteophytes.   IMPRESSION: 1.  No evidence of acute fracture or subluxation.   2.  Mild multilevel degenerative disc disease of the cervical spine.   PATIENT SURVEYS:  FOTO 63     COGNITION: Overall cognitive status: Within functional limits for  tasks assessed     SENSATION/NEURO: Light touch WNL and symmetrical UE/LE Unremarkable for dysdiadochokinesia testing Negative ataxia/tremors Consistent overshooting of L eye with ocular adduction during saccade testing Smooth pursuits unremarkable in all directions Concordant L eye discomfort noted with convergence testing   POSTURE: rounded shoulders and forward head, excessive UT elevation   PALPATION: Palpable nontender tightness B upper trap, levator scap, and rhomboids.      CERVICAL ROM:    Active ROM A/PROM (deg) eval  Flexion 75%  Extension 75%  Right lateral flexion    Left lateral flexion    Right rotation 36  Left rotation 30   (Blank rows = not tested) Comments: stiffness L side with flex/ext, no pain. Nonpainful stretching on L side with B rotation   UPPER EXTREMITY ROM:   Active ROM Right eval Left eval  Shoulder flexion [facility]  Shoulder abduction      Shoulder internal rotation      Shoulder external rotation      Elbow flexion      Elbow extension      Wrist flexion      Wrist extension       (Blank rows = not tested) Comments: painless shoulder elevation   UPPER EXTREMITY MMT:   MMT Right eval Left eval  Shoulder flexion 5 5  Shoulder extension      Shoulder abduction 5 5  Shoulder extension      Shoulder internal rotation 5 5  Shoulder external rotation 5 5  Elbow flexion 5 5  Elbow extension 5 5  Grip strength       (Blank rows = not tested)          Comments: no pain with above   CERVICAL SPECIAL TESTS:  Spurling's test: Positive and Sharp pursor's test: Negative                       Sharps Pursor test: unremarkable                       Spurlings test: Concordant pain reproduced with lateral flexion + pressure BIL     TODAY'S TREATMENT:  [facility] Adult PT Treatment:                                                DATE: 12/08/2021  Therapeutic Exercise: Thoracic extensions, standing at wall with foam roller, x20 Swiss ball  flexion at wall, x15 cues for form and pacing Waiter's carry 5# KB 3x79f each UE, cues for form and scap positioning Reverse fly B cable column 3# 2x12 Neuromuscular re-ed: Standing chin tucks w/ ball at wall 2x15, for improved DNF activation, cues for form and reduced compensations Cable column unilat wide rows performed B, 13# 2x10, 20# 2x5 for improved  periscapular activation Cable column tall kneeling high>low rows 13# x15, 17# x15 unilat but performed bilat for improved lat activation, cues for appropriate posture and form   [facility] Adult PT Treatment:                                                DATE: 12/05/21 Therapeutic Exercise: Thoracic extensions, standing at wall with foam roller, 4x10, cues for comfortable ROM Swiss ball flexion at wall, x15 cues for form and pacing Manual Therapy: Trigger point release L levator scap/upper trap with active cervical flexion/rotation STM paracervicals/periscapular musculature Neuromuscular re-ed: Seated chin tucks x 10 Standing chin tucks w/ ball at wall 2x10, for improved DNF activation, cues for form and reduced compensations Cable column rows wide rows 10# BUE 2x15 for improved periscapular activation, cues for reduced compensations at upper traps Cable column tall kneeling high>low rows 13#, 2x10 unilat but performed bilat for improved lat activation, cues for appropriate posture and form   [facility] Adult PT Treatment:                                                DATE: 11/25/2021  Therapeutic Exercise: Seated chin tucks x10, cues for form and posture Seated scapular retraction x10, cues for form, upright posture, reduced upper trap compensation   Manual Therapy: Trigger point release L levator scap and upper trap with active cervical flexion followed by rotation                   PATIENT EDUCATION:  Education details:rationale for interventions, PT progress Person educated: Patient Education method: Explanation, Demonstration, Tactile  cues, Verbal cues Education comprehension: verbalized understanding, returned demonstration, verbal cues required, tactile cues required, and needs further education      HOME EXERCISE PROGRAM: Access Code: [email or web address] URL: [email or web address] Date: 11/25/2021 Prepared by: Enis Slipper   Exercises - Seated Cervical Retraction  - 1 x daily - 7 x weekly - 3 sets - 10 reps - Seated Scapular Retraction  - 1 x daily - 7 x weekly - 3 sets - 10 reps   ASSESSMENT:   CLINICAL IMPRESSION: Patient is a pleasant 71 y.o. gentleman who was seen today for physical therapy evaluation and treatment for ongoing neck/head/eye pain. Pt arrives with reports of no sharp pains in past week, exercises going well. Progressed for reduced rest breaks on this date and cessation of manual therapy given improvement in symptoms. Able to progress for increased resistance/complexity of periscapular exercises, education given on modification to his typical gym routine to facilitate  Pt continues to demonstrate postural deficits and limitations in cervicothoracic mobility. Tolerates session well with no instances of pain, primary report of muscle working sensation.   Recommend skilled PT to address aforementioned deficits to maximize functional independence and tolerance. Pt departs today's session in no acute distress, all voiced questions/concerns addressed appropriately from PT perspective.         OBJECTIVE IMPAIRMENTS decreased mobility, decreased ROM, postural dysfunction, and pain.    ACTIVITY LIMITATIONS reach over head   PARTICIPATION LIMITATIONS: driving and community activity   PERSONAL FACTORS Time since onset of injury/illness/exacerbation and 3+ comorbidities: HTN, CKD, sarcoidosis  are also affecting patient's functional outcome.    REHAB  POTENTIAL: Good   CLINICAL DECISION MAKING: Evolving/moderate complexity   EVALUATION COMPLEXITY: Moderate     GOALS: Goals reviewed with patient?  Yes   SHORT TERM GOALS: Target date: 12/09/2021    Pt will demonstrate appropriate understanding and performance of initially prescribed HEP in order to facilitate improved independence with management of symptoms.  Baseline: HEP provided on eval Goal status: INITIAL    2. Pt will score greater than or equal to 65 on FOTO in order to demonstrate improved perception of function due to symptoms.            Baseline: 63            Goal status: INITIAL      LONG TERM GOALS: Target date: 12/23/2021   Pt will score 68 on FOTO in order to demonstrate improved perception of function due to symptoms. Baseline: 63 Goal status: INITIAL   Pt will demonstrate at least 60 degrees of active cervical rotation ROM with less than 3/10 pain on NPS in order to demonstrate improved environmental awareness and safety with driving.  Baseline: 30 to L, 36 towards R Goal status: INITIAL   3. Pt will report/demonstrate ability to look towards R with less than 2 point increase on NPS in order to demonstrate improved safety and environmental awareness.             Baseline: up to 8/10 reported            Goal status: INITIAL       PLAN: PT FREQUENCY: 2x/week   PT DURATION: 4 weeks   PLANNED INTERVENTIONS: Therapeutic exercises, Therapeutic activity, Neuromuscular re-education, Balance training, Gait training, Patient/Family education, Self Care, Joint mobilization, Vestibular training, Canalith repositioning, Aquatic Therapy, Dry Needling, Spinal mobilization, Cryotherapy, Moist heat, Manual therapy, and Re-evaluation   PLAN FOR NEXT SESSION: update HEP and discuss gym program. If continues along current trajectory may begin to discuss discharge planning      Leeroy Cha PT, DPT 12/09/2021 10:14 AM

## 2021-12-09 ENCOUNTER — Ambulatory Visit: Payer: Medicare HMO | Admitting: Physical Therapy

## 2021-12-09 ENCOUNTER — Encounter: Payer: Self-pay | Admitting: Physical Therapy

## 2021-12-09 ENCOUNTER — Telehealth: Payer: Self-pay | Admitting: *Deleted

## 2021-12-09 DIAGNOSIS — R293 Abnormal posture: Secondary | ICD-10-CM

## 2021-12-09 DIAGNOSIS — M542 Cervicalgia: Secondary | ICD-10-CM | POA: Diagnosis not present

## 2021-12-09 DIAGNOSIS — M5030 Other cervical disc degeneration, unspecified cervical region: Secondary | ICD-10-CM | POA: Diagnosis not present

## 2021-12-09 DIAGNOSIS — H519 Unspecified disorder of binocular movement: Secondary | ICD-10-CM

## 2021-12-09 NOTE — Telephone Encounter (Signed)
Referral placed per Dr. Estanislado Pandy.

## 2021-12-12 ENCOUNTER — Ambulatory Visit: Payer: Medicare HMO | Admitting: Physical Therapy

## 2021-12-14 ENCOUNTER — Ambulatory Visit: Payer: Medicare HMO | Admitting: Physical Therapy

## 2021-12-16 NOTE — Therapy (Signed)
OUTPATIENT PHYSICAL THERAPY TREATMENT NOTE   Patient Name: Philip Woodard MRN: [id number] DOB:[birth year], 71 y.o., male Today's Date: 12/19/2021  PCP: Horald Pollen, MD   REFERRING PROVIDER: Bo Merino, MD  END OF SESSION:   PT End of Session - 12/19/21 1015     Visit Number 4    Number of Visits 9    Date for PT Re-Evaluation 12/23/21    Authorization Type [employer]    Progress Note Due on Visit 10    PT Start Time 1015    PT Stop Time 1055    PT Time Calculation (min) 40 min    Activity Tolerance Patient tolerated treatment well    Behavior During Therapy WFL for tasks assessed/performed               Past Medical History:  Diagnosis Date   Alkaline phosphatase raised 06/28/2011   Fluctuating - suspect due to sarcoidosis   Allergic rhinitis    Allergy    Arthritis    knees   Genital herpes    Hemorrhoids    Hyperglycemia    Hyperplastic lymph node 06/27/2011   Submental node excised 1/02; regrowth: re-excision 10/06   Hypertension    Hypothyroidism    Multinodular goiter    Routine general medical examination at a health care facility    Sarcoidosis    Sleep apnea    wears cpap    Past Surgical History:  Procedure Laterality Date   COLONOSCOPY  5-23/2003   COLONOSCOPY  02/04/2018   TA   COLONOSCOPY WITH PROPOFOL  05/26/2021   [facility]io Edward at [place] Bilateral [id number]   THYROIDECTOMY  2002   Patient Active Problem List   Diagnosis Date Noted   Cervical radiculopathy 10/17/2021   Facial pain, acute 10/17/2021   Dyspepsia 07/20/2021   Lower urinary tract symptoms 07/20/2021   Cervical strain 04/27/2021   Chronic idiopathic gout involving toe of left foot without tophus 04/27/2021   Stage 3a chronic kidney disease ([place]) 03/31/2021   Erectile dysfunction due to arterial insufficiency 03/31/2021   NASH (nonalcoholic steatohepatitis) 02/22/2015   Arthritis of right lower extremity  07/20/2014   Hearing loss 02/19/2014   OSA on CPAP 05/02/2013   Hypothyroid 03/29/2012   Primary hypertension 11/24/2008   HYPOTHYROIDISM, POSTSURGICAL 07/07/2008   HYPERTROPHY PROSTATE W/UR OBST & OTH LUTS 07/07/2008   [place] DISEASE, LUMBAR 12/27/2007   Sarcoidosis 06/06/2007   GOITER, MULTINODULAR 06/06/2007   HEMORRHOIDS 06/06/2007   ASTHMA 06/06/2007    REFERRING DIAG: M50.30 (ICD-10-CM) - DDD (degenerative disc disease), cervical  THERAPY DIAG:  Cervicalgia  Abnormal posture  Rationale for Evaluation and Treatment Rehabilitation  PERTINENT HISTORY: HTN, CKD, sarcoidosis  PRECAUTIONS: none  SUBJECTIVE:  Pt arrives with no overt pain although he notes that he noticed a difference in his pain with taking a week off from therapy and the gym, had about 3 days in last week where he had the sharp neck/eye pain while turning his head. No other new updates, states he should be seeing a neurologist soon.    PAIN:  Are you having pain: no overt pain at rest, describes as stiff Location: L side of head, neck, and shoulder  How would you describe your pain? Sharp pain + some numbness/discomfort Best: 0/10 Worst: 8/10 Aggravating factors: pain with ocular movement towards R, pain with head movement towards R Easing factors: avoiding movement, rest. Symptoms ease quickly after onset  OBJECTIVE: (objective measures completed at initial evaluation unless otherwise dated)   DIAGNOSTIC FINDINGS:  Cervical x ray 04/27/21 FINDINGS: There is no evidence of cervical spine fracture or prevertebral soft tissue swelling. Alignment is normal. Multilevel degenerate disc disease with disc space narrowing and marginal osteophytes.   IMPRESSION: 1.  No evidence of acute fracture or subluxation.   2.  Mild multilevel degenerative disc disease of the cervical spine.   PATIENT SURVEYS:  FOTO 63     COGNITION: Overall cognitive status: Within functional limits for tasks assessed      SENSATION/NEURO: Light touch WNL and symmetrical UE/LE Unremarkable for dysdiadochokinesia testing Negative ataxia/tremors Consistent overshooting of L eye with ocular adduction during saccade testing Smooth pursuits unremarkable in all directions Concordant L eye discomfort noted with convergence testing   POSTURE: rounded shoulders and forward head, excessive UT elevation   PALPATION: Palpable nontender tightness B upper trap, levator scap, and rhomboids.      CERVICAL ROM:    Active ROM A/PROM (deg) eval  Flexion 75%  Extension 75%  Right lateral flexion    Left lateral flexion    Right rotation 36  Left rotation 30   (Blank rows = not tested) Comments: stiffness L side with flex/ext, no pain. Nonpainful stretching on L side with B rotation   UPPER EXTREMITY ROM:   Active ROM Right eval Left eval  Shoulder flexion [facility]  Shoulder abduction      Shoulder internal rotation      Shoulder external rotation      Elbow flexion      Elbow extension      Wrist flexion      Wrist extension       (Blank rows = not tested) Comments: painless shoulder elevation   UPPER EXTREMITY MMT:   MMT Right eval Left eval  Shoulder flexion 5 5  Shoulder extension      Shoulder abduction 5 5  Shoulder extension      Shoulder internal rotation 5 5  Shoulder external rotation 5 5  Elbow flexion 5 5  Elbow extension 5 5  Grip strength       (Blank rows = not tested)          Comments: no pain with above   CERVICAL SPECIAL TESTS:  Spurling's test: Positive and Sharp pursor's test: Negative                       Sharps Pursor test: unremarkable                       Spurlings test: Concordant pain reproduced with lateral flexion + pressure BIL     TODAY'S TREATMENT:  [facility] Adult PT Treatment:                                                DATE: 12/19/21 Therapeutic Exercise: Cat/camel x15 Swiss ball flexion at wall, push  lift off x10 BUE Waiter's carry 5# KB 3x29f Bent  reverse fly unilat w/ UE support, 3# DB, 2x8 cues for form, setup, and pacing Neuromuscular re-ed: Chin tuck w ball at wall + B scaption 2x10 for DNF activation Cable column unilat wide row B, 17#B x8, 13#B 2x8 Cable column tall kneeling high>low rows 13# 3x10 Machine rows 45# x15, 55# 2x15  [facility] Adult PT Treatment:                                                DATE: 12/08/2021  Therapeutic Exercise: Thoracic extensions, standing at wall with foam roller, x20 Swiss ball flexion at wall, x15 cues for form and pacing Waiter's carry 5# KB 3x69f each UE, cues for form and scap positioning Reverse fly B cable column 3# 2x12 Neuromuscular re-ed: Standing chin tucks w/ ball at wall 2x15, for improved DNF activation, cues for form and reduced compensations Cable column unilat wide rows performed B, 13# 2x10, 20# 2x5 for improved periscapular activation Cable column tall kneeling high>low rows 13# x15, 17# x15 unilat but performed bilat for improved lat activation, cues for appropriate posture and form                      PATIENT EDUCATION:  Education details:rationale for interventions, PT progress Person educated: Patient Education method: Explanation, Demonstration, Tactile cues, Verbal cues Education comprehension: verbalized understanding, returned demonstration, verbal cues required, tactile cues required, and needs further education      HOME EXERCISE PROGRAM: Access Code: [email or web address]: [email or web address] Date: 11/25/2021 Prepared by: DEnis Slipper  Exercises - Seated Cervical Retraction  - 1 x daily - 7 x weekly - 3 sets - 10 reps - Seated Scapular Retraction  - 1 x daily - 7 x weekly - 3 sets - 10 reps   ASSESSMENT:   CLINICAL IMPRESSION: Patient is a pleasant 71y.o. gentleman who was seen today for physical therapy evaluation and treatment for ongoing neck/head/eye pain. Pt arrives with minimal symptoms but does report having increased pain in  time off from therapy and gym. Session initiated with cervicothoracic mobility exercises, tolerates well. Limited progression in resistance given time off from PT/exercise but was able to progress complexity of cervical stability exercises, emphasis on good form with periscapular strengthening/activation exercises. Pt tolerates session quite well, no adverse events or increase in symptoms.   Recommend skilled PT to address aforementioned deficits to maximize functional independence and tolerance. Pt departs today's session in no acute distress, all voiced questions/concerns addressed appropriately from PT perspective.         OBJECTIVE IMPAIRMENTS decreased mobility, decreased ROM, postural dysfunction, and pain.    ACTIVITY LIMITATIONS reach over head   PARTICIPATION LIMITATIONS: driving and community activity   PERSONAL FACTORS Time since onset of injury/illness/exacerbation and 3+ comorbidities: HTN, CKD, sarcoidosis  are also affecting patient's functional outcome.    REHAB POTENTIAL: Good   CLINICAL DECISION MAKING: Evolving/moderate complexity   EVALUATION COMPLEXITY: Moderate     GOALS: Goals reviewed with patient? Yes   SHORT TERM GOALS: Target date: 12/09/2021    Pt will demonstrate appropriate understanding and performance of initially prescribed HEP in order to facilitate improved independence with management of symptoms.  Baseline: HEP provided on eval Goal status: INITIAL    2. Pt will score greater than or equal to 65 on FOTO in order to demonstrate improved perception of function due to symptoms.            Baseline: 63            Goal status: INITIAL      LONG TERM GOALS: Target date: 12/23/2021   Pt will score 68 on FOTO in order to demonstrate improved perception  of function due to symptoms. Baseline: 63 Goal status: INITIAL   Pt will demonstrate at least 60 degrees of active cervical rotation ROM with less than 3/10 pain on NPS in order to demonstrate  improved environmental awareness and safety with driving.  Baseline: 30 to L, 36 towards R Goal status: INITIAL   3. Pt will report/demonstrate ability to look towards R with less than 2 point increase on NPS in order to demonstrate improved safety and environmental awareness.             Baseline: up to 8/10 reported            Goal status: INITIAL       PLAN: PT FREQUENCY: 2x/week   PT DURATION: 4 weeks   PLANNED INTERVENTIONS: Therapeutic exercises, Therapeutic activity, Neuromuscular re-education, Balance training, Gait training, Patient/Family education, Self Care, Joint mobilization, Vestibular training, Canalith repositioning, Aquatic Therapy, Dry Needling, Spinal mobilization, Cryotherapy, Moist heat, Manual therapy, and Re-evaluation   PLAN FOR NEXT SESSION: progress strengthening as able/appropriate. Re-assess vestibular screen and consider extending POC      Leeroy Cha PT, DPT 12/19/2021 10:56 AM

## 2021-12-18 ENCOUNTER — Other Ambulatory Visit: Payer: Self-pay | Admitting: Student

## 2021-12-18 DIAGNOSIS — D869 Sarcoidosis, unspecified: Secondary | ICD-10-CM

## 2021-12-18 DIAGNOSIS — Z79899 Other long term (current) drug therapy: Secondary | ICD-10-CM

## 2021-12-19 ENCOUNTER — Ambulatory Visit: Payer: Medicare HMO | Attending: Rheumatology | Admitting: Physical Therapy

## 2021-12-19 ENCOUNTER — Encounter: Payer: Self-pay | Admitting: Physical Therapy

## 2021-12-19 DIAGNOSIS — M542 Cervicalgia: Secondary | ICD-10-CM | POA: Diagnosis not present

## 2021-12-19 DIAGNOSIS — R293 Abnormal posture: Secondary | ICD-10-CM | POA: Insufficient documentation

## 2021-12-20 NOTE — Therapy (Signed)
OUTPATIENT PHYSICAL THERAPY TREATMENT NOTE/RECERTIFICATION   Patient Name: Philip Woodard MRN: [id number] DOB:1950/04/14, 71 y.o., male Today's Date: 12/21/2021  PCP: Horald Pollen, MD   REFERRING PROVIDER: Bo Merino, MD  END OF SESSION:   PT End of Session - 12/21/21 1018     Visit Number 5    Number of Visits 9    Date for PT Re-Evaluation 02/15/22    Authorization Type [employer]    Progress Note Due on Visit 10    PT Start Time 1018    PT Stop Time 1057    PT Time Calculation (min) 39 min    Activity Tolerance Patient tolerated treatment well    Behavior During Therapy WFL for tasks assessed/performed                Past Medical History:  Diagnosis Date   Alkaline phosphatase raised 06/28/2011   Fluctuating - suspect due to sarcoidosis   Allergic rhinitis    Allergy    Arthritis    knees   Genital herpes    Hemorrhoids    Hyperglycemia    Hyperplastic lymph node 06/27/2011   Submental node excised 1/02; regrowth: re-excision 10/06   Hypertension    Hypothyroidism    Multinodular goiter    Routine general medical examination at a health care facility    Sarcoidosis    Sleep apnea    wears cpap    Past Surgical History:  Procedure Laterality Date   COLONOSCOPY  5-23/2003   COLONOSCOPY  02/04/2018   TA   COLONOSCOPY WITH PROPOFOL  05/26/2021   [facility] at [place] Bilateral [id number]   THYROIDECTOMY  2002   Patient Active Problem List   Diagnosis Date Noted   Cervical radiculopathy 10/17/2021   Facial pain, acute 10/17/2021   Dyspepsia 07/20/2021   Lower urinary tract symptoms 07/20/2021   Cervical strain 04/27/2021   Chronic idiopathic gout involving toe of left foot without tophus 04/27/2021   Stage 3a chronic kidney disease ([place]) 03/31/2021   Erectile dysfunction due to arterial insufficiency 03/31/2021   NASH (nonalcoholic steatohepatitis) 02/22/2015   Arthritis of right lower  extremity 07/20/2014   Hearing loss 02/19/2014   OSA on CPAP 05/02/2013   Hypothyroid 03/29/2012   Primary hypertension 11/24/2008   HYPOTHYROIDISM, POSTSURGICAL 07/07/2008   HYPERTROPHY PROSTATE W/UR OBST & OTH LUTS 07/07/2008   [place] DISEASE, LUMBAR 12/27/2007   Sarcoidosis 06/06/2007   GOITER, MULTINODULAR 06/06/2007   HEMORRHOIDS 06/06/2007   ASTHMA 06/06/2007    REFERRING DIAG: M50.30 (ICD-10-CM) - DDD (degenerative disc disease), cervical  THERAPY DIAG:  Cervicalgia  Abnormal posture  Rationale for Evaluation and Treatment Rehabilitation  PERTINENT HISTORY: HTN, CKD, sarcoidosis  PRECAUTIONS: none  SUBJECTIVE:  Pt arrives with report of no pain since last session aside from mild discomfort in L eye and face that does not worsen with movement. No other new updates   PAIN:  Are you having pain: no overt pain at rest, describes as stiff Location: L side of head, neck, and shoulder  How would you describe your pain? Sharp pain + some numbness/discomfort Best: 0/10 Worst: 6/10 Aggravating factors: pain with ocular movement towards R, pain with head movement towards R Easing factors: avoiding movement, rest. Symptoms ease quickly after onset   OBJECTIVE: (objective measures completed at initial evaluation unless otherwise dated)   DIAGNOSTIC FINDINGS:  Cervical x ray 04/27/21 FINDINGS: There is no evidence of cervical  spine fracture or prevertebral soft tissue swelling. Alignment is normal. Multilevel degenerate disc disease with disc space narrowing and marginal osteophytes.   IMPRESSION: 1.  No evidence of acute fracture or subluxation.   2.  Mild multilevel degenerative disc disease of the cervical spine.   PATIENT SURVEYS:  FOTO 63  12/21/21: 71   COGNITION: Overall cognitive status: Within functional limits for tasks assessed     SENSATION/NEURO: Light touch WNL and symmetrical UE/LE Unremarkable for dysdiadochokinesia testing Negative  ataxia/tremors Consistent overshooting of L eye with ocular adduction during saccade testing Smooth pursuits unremarkable in all directions Concordant L eye discomfort noted with convergence testing  12/21/21: light touch WNL and symmetrical Unremarkable for dysdiadochokinesia testing Negative ataxia/tremors Convergence testing unremarkable with no eye discomfort  Questionable overshooting of L eye with occular abduction during saccade testing, less consistent than on eval   POSTURE: rounded shoulders and forward head, excessive UT elevation   PALPATION: Palpable nontender tightness B upper trap, levator scap, and rhomboids.      CERVICAL ROM:    Active ROM A/PROM (deg) eval AROM 12/21/21  Flexion 75% 100% mild stiffness  Extension 75% 75%  Right lateral flexion     Left lateral flexion     Right rotation 36 41  Left rotation 30 28   (Blank rows = not tested) Comments: stiffness L side with flex/ext, no pain. Nonpainful stretching on L side with B rotation   UPPER EXTREMITY ROM:   Active ROM Right eval Left eval  Shoulder flexion [facility]  Shoulder abduction      Shoulder internal rotation      Shoulder external rotation      Elbow flexion      Elbow extension      Wrist flexion      Wrist extension       (Blank rows = not tested) Comments: painless shoulder elevation   UPPER EXTREMITY MMT:   MMT Right eval Left eval  Shoulder flexion 5 5  Shoulder extension      Shoulder abduction 5 5  Shoulder extension      Shoulder internal rotation 5 5  Shoulder external rotation 5 5  Elbow flexion 5 5  Elbow extension 5 5  Grip strength       (Blank rows = not tested)          Comments: no pain with above   CERVICAL SPECIAL TESTS:  Spurling's test: Positive and Sharp pursor's test: Negative                       Sharps Pursor test: unremarkable                       Spurlings test: Concordant pain reproduced with lateral flexion + pressure BIL     TODAY'S  TREATMENT:  [facility] Adult PT Treatment:                                                DATE: 12/21/21 Therapeutic Exercise: Swiss ball roll + push + lift off at wall, x10 Cat/camel x15 Cable column reverse fly 3# each UE, 2x12 Cable column wide row 10# 2x10 DB shrugs 8# each UE, 2x12 LS stretch 3x30sec B cues for form  [facility] Adult PT Treatment:  DATE: 12/19/21 Therapeutic Exercise: Cat/camel x15 Swiss ball flexion at wall, push  lift off x10 BUE Waiter's carry 5# KB 3x5f Bent reverse fly unilat w/ UE support, 3# DB, 2x8 cues for form, setup, and pacing Neuromuscular re-ed: Chin tuck w ball at wall + B scaption 2x10 for DNF activation Cable column unilat wide row B, 17#B x8, 13#B 2x8 Cable column tall kneeling high>low rows 13# 3x10 Machine rows 45# x15, 55# 2x15     PATIENT EDUCATION:  Education details:rationale for interventions, PT progress Person educated: Patient Education method: Explanation, Demonstration, Tactile cues, Verbal cues Education comprehension: verbalized understanding, returned demonstration, verbal cues required, tactile cues required, and needs further education      HOME EXERCISE PROGRAM: Access Code: [email or web address]: [email or web address] Date: 11/25/2021 Prepared by: DEnis Slipper  Exercises - Seated Cervical Retraction  - 1 x daily - 7 x weekly - 3 sets - 10 reps - Seated Scapular Retraction  - 1 x daily - 7 x weekly - 3 sets - 10 reps   ASSESSMENT:   CLINICAL IMPRESSION: Patient is a pleasant 71y.o. gentleman who was seen today for physical therapy evaluation and treatment for ongoing neck/head/eye pain. Pt arrives and reports no instances of sharp pain since last session. Pt with modest improvement in cervical rotation towards L, no significant change in cervical rotation towards R. FOTO scored at 64 which is minimally improved compared to 63 at initial evaluation. Pt is progressing well  with therapy in terms of symptomology but given continued limitations in cervical mobility and pt report of increased pain with time off from therapy last week, would like to extend POC. Pt tolerates today's session quite well with no increase in pain, cues primarily centered around form and reduced UT compensations. Addition of specific LS/UT exercises with aim of improving cervical tissue extensibility. Pt denies any increase in symptoms throughout session, no adverse events.   Recommend skilled PT to address aforementioned deficits to maximize functional independence and tolerance. Pt departs today's session in no acute distress, all voiced questions/concerns addressed appropriately from PT perspective.         OBJECTIVE IMPAIRMENTS decreased mobility, decreased ROM, postural dysfunction, and pain.    ACTIVITY LIMITATIONS reach over head   PARTICIPATION LIMITATIONS: driving and community activity   PERSONAL FACTORS Time since onset of injury/illness/exacerbation and 3+ comorbidities: HTN, CKD, sarcoidosis  are also affecting patient's functional outcome.    REHAB POTENTIAL: Good   CLINICAL DECISION MAKING: Evolving/moderate complexity   EVALUATION COMPLEXITY: Moderate     GOALS: Goals reviewed with patient? Yes   SHORT TERM GOALS: Target date: 12/09/2021    Pt will demonstrate appropriate understanding and performance of initially prescribed HEP in order to facilitate improved independence with management of symptoms.  Baseline: HEP provided on eval 12/21/21: independent w/ HEP Goal status: MET   2. Pt will score greater than or equal to 65 on FOTO in order to demonstrate improved perception of function due to symptoms.            Baseline: 63  12/21/21: 64            Goal status: ongoing     LONG TERM GOALS: Target date: 12/23/2021 (extend to 01/20/22 as of 12/21/21)   Pt will score 68 on FOTO in order to demonstrate improved perception of function due to  symptoms. Baseline: 63 12/21/21: ongoing Goal status: INITIAL   Pt will demonstrate at least 60 degrees of active cervical rotation ROM with  less than 3/10 pain on NPS in order to demonstrate improved environmental awareness and safety with driving.  Baseline: 30 to L, 36 towards R 12/21/21: 28 to L, 41 to R; no pain with either Goal status: ongoing   3. Pt will report/demonstrate ability to look towards R with less than 2 point increase on NPS in order to demonstrate improved safety and environmental awareness.             Baseline: up to 8/10 reported  12/21/21: no pain with looking towards R            Goal status: MET       PLAN: PT FREQUENCY: 2x/week with aim of tapering to 1x/week   PT DURATION: 4 weeks   PLANNED INTERVENTIONS: Therapeutic exercises, Therapeutic activity, Neuromuscular re-education, Balance training, Gait training, Patient/Family education, Self Care, Joint mobilization, Vestibular training, Canalith repositioning, Aquatic Therapy, Dry Needling, Spinal mobilization, Cryotherapy, Moist heat, Manual therapy, and Re-evaluation   PLAN FOR NEXT SESSION: continue strength/stability exercises as able appropriate, progress cervical mobility exercises as able/appropriate      Leeroy Cha PT, DPT 12/21/2021 11:01 AM

## 2021-12-21 ENCOUNTER — Ambulatory Visit: Payer: Medicare HMO | Admitting: Physical Therapy

## 2021-12-21 ENCOUNTER — Encounter: Payer: Self-pay | Admitting: Physical Therapy

## 2021-12-21 DIAGNOSIS — M542 Cervicalgia: Secondary | ICD-10-CM | POA: Diagnosis not present

## 2021-12-21 DIAGNOSIS — R293 Abnormal posture: Secondary | ICD-10-CM

## 2021-12-26 ENCOUNTER — Ambulatory Visit: Payer: Medicare HMO | Admitting: Physical Therapy

## 2021-12-27 NOTE — Progress Notes (Signed)
Office Visit Note  Patient: Philip Woodard             Date of Birth: 12/13/1950           MRN: [id number]             PCP: Horald Pollen, MD Referring: Horald Pollen, * Visit Date: 01/10/2022 Occupation: @GUAROCC @  Subjective:  Medication management  History of Present Illness: Philip Woodard is a 71 y.o. male with possible history of polymyalgia rheumatica, sarcoidosis and osteoarthritis.  He has been tapering prednisone gradually.  He is currently on prednisone 4 mg p.o. daily.  He continues to take hydroxychloroquine 200 mg twice daily without any side effects.  He denies any increased shortness of breath or joint pain.  He denies having any gout flares.  He continues to have some discomfort in his neck.  He takes gabapentin which helps.  He has intermittent lower back pain.  He has been working out on a regular basis.  He has been also going to physical therapy which has been helpful.  Activities of Daily Living:  Patient reports morning stiffness for 0 minutes.   Patient Denies nocturnal pain.  Difficulty dressing/grooming: Denies Difficulty climbing stairs: Denies Difficulty getting out of chair: Denies Difficulty using hands for taps, buttons, cutlery, and/or writing: Denies  Review of Systems  Constitutional:  Negative for fatigue.  HENT:  Negative for mouth sores and mouth dryness.   Eyes:  Negative for dryness.  Respiratory:  Negative for shortness of breath.   Cardiovascular:  Negative for chest pain and palpitations.  Gastrointestinal:  Negative for blood in stool, constipation and diarrhea.  Endocrine: Negative for increased urination.  Genitourinary:  Negative for involuntary urination.  Musculoskeletal:  Negative for joint pain, gait problem, joint pain, joint swelling, myalgias, muscle weakness, morning stiffness, muscle tenderness and myalgias.  Skin:  Negative for color change, rash and sensitivity to sunlight.  Allergic/Immunologic: Negative  for susceptible to infections.  Neurological:  Negative for dizziness and headaches.  Hematological:  Negative for swollen glands.  Psychiatric/Behavioral:  Negative for depressed mood and sleep disturbance. The patient is not nervous/anxious.     PMFS History:  Patient Active Problem List   Diagnosis Date Noted   Cervical radiculopathy 10/17/2021   Facial pain, acute 10/17/2021   Dyspepsia 07/20/2021   Lower urinary tract symptoms 07/20/2021   Cervical strain 04/27/2021   Chronic idiopathic gout involving toe of left foot without tophus 04/27/2021   Stage 3a chronic kidney disease ([place]) 03/31/2021   Erectile dysfunction due to arterial insufficiency 03/31/2021   NASH (nonalcoholic steatohepatitis) 02/22/2015   Arthritis of right lower extremity 07/20/2014   Hearing loss 02/19/2014   OSA on CPAP 05/02/2013   Hypothyroid 03/29/2012   Primary hypertension 11/24/2008   HYPOTHYROIDISM, POSTSURGICAL 07/07/2008   HYPERTROPHY PROSTATE W/UR OBST & OTH LUTS 07/07/2008   [place] DISEASE, LUMBAR 12/27/2007   Sarcoidosis 06/06/2007   GOITER, MULTINODULAR 06/06/2007   HEMORRHOIDS 06/06/2007   ASTHMA 06/06/2007    Past Medical History:  Diagnosis Date   Alkaline phosphatase raised 06/28/2011   Fluctuating - suspect due to sarcoidosis   Allergic rhinitis    Allergy    Arthritis    knees   Genital herpes    Hemorrhoids    Hyperglycemia    Hyperplastic lymph node 06/27/2011   Submental node excised 1/02; regrowth: re-excision 10/06   Hypertension    Hypothyroidism    Multinodular goiter    Routine general  medical examination at a health care facility    Sarcoidosis    Sleep apnea    wears cpap     Family History  Problem Relation Age of Onset   Hypertension Mother    Dementia Father    Sleep apnea Brother    Healthy Brother    Sleep apnea Paternal Aunt    Asthma Cousin    Healthy Daughter    Healthy Daughter    Sarcoidosis Neg Hx    Colon polyps Neg Hx    Colon cancer Neg  Hx    Esophageal cancer Neg Hx    Stomach cancer Neg Hx    Rectal cancer Neg Hx    Past Surgical History:  Procedure Laterality Date   COLONOSCOPY  5-23/2003   COLONOSCOPY  02/04/2018   TA   COLONOSCOPY WITH PROPOFOL  05/26/2021   Lucio Edward at [place] Bilateral [id number]   THYROIDECTOMY  2002   Social History   Social History Narrative   Not on file   Immunization History  Administered Date(s) Administered   Fluad Quad(high Dose 65+) 12/11/2018, 01/20/2020, 03/31/2021   Influenza Whole 12/11/2009, 12/11/2011   Influenza, High Dose Seasonal PF 11/14/2017   Influenza,inj,Quad PF,6+ Mos 01/31/2013, 12/10/2013, 02/22/2016, 12/19/2016   Moderna Sars-Covid-2 Vaccination 03/18/2020   PFIZER(Purple Top)SARS-COV-2 Vaccination 04/04/2019, 04/25/2019   Pneumococcal Conjugate-13 11/14/2017   Pneumococcal Polysaccharide-23 01/31/2013   Td 10/23/2005   Tdap 02/22/2016   Zoster, Live 01/31/2013     Objective: Vital Signs: BP (!) 142/75 (BP Location: Left Arm, Patient Position: Sitting, Cuff Size: Normal)   Pulse 89   Resp 15   Ht 5' 7"  (1.702 m)   Wt 198 lb 9.6 oz (90.1 kg)   BMI 31.11 kg/m    Physical Exam Vitals and nursing note reviewed.  Constitutional:      Appearance: He is well-developed.  HENT:     Head: Normocephalic and atraumatic.  Eyes:     Conjunctiva/sclera: Conjunctivae normal.     Pupils: Pupils are equal, round, and reactive to light.  Cardiovascular:     Rate and Rhythm: Normal rate and regular rhythm.     Heart sounds: Normal heart sounds.  Pulmonary:     Effort: Pulmonary effort is normal.     Breath sounds: Normal breath sounds.  Abdominal:     General: Bowel sounds are normal.     Palpations: Abdomen is soft.  Musculoskeletal:     Cervical back: Normal range of motion and neck supple.  Skin:    General: Skin is warm and dry.     Capillary Refill: Capillary refill takes less than 2 seconds.  Neurological:      Mental Status: He is alert and oriented to person, place, and time.  Psychiatric:        Behavior: Behavior normal.      Musculoskeletal Exam: He had limited lateral rotation of the cervical spine.  He had no discomfort range of motion of the lumbar spine.  Shoulder joints, elbow joints, wrist joints with good range of motion.  He had good range of motion of the wrist joints, MCPs PIPs and DIPs without any synovitis.  Hip joints and knee joints with good range of motion without any warmth swelling or effusion.  He had no difficulty getting up from the chair.  He had no difficulty getting up from a squatting position.  CDAI Exam: CDAI Score: -- Patient Global: --; Provider  Global: -- Swollen: --; Tender: -- Joint Exam 01/10/2022   No joint exam has been documented for this visit   There is currently no information documented on the homunculus. Go to the Rheumatology activity and complete the homunculus joint exam.  Investigation: No additional findings.  Imaging: No results found.  Recent Labs: Lab Results  Component Value Date   WBC 8.8 09/30/2021   HGB 13.9 09/30/2021   PLT 294 09/30/2021   NA 141 09/30/2021   K 4.2 09/30/2021   CL 105 09/30/2021   CO2 31 09/30/2021   GLUCOSE 84 09/30/2021   BUN 16 09/30/2021   CREATININE 1.38 (H) 09/30/2021   BILITOT 0.6 09/30/2021   ALKPHOS 62 07/25/2021   AST 23 09/30/2021   ALT 16 09/30/2021   PROT 6.8 09/30/2021   ALBUMIN 4.3 07/25/2021   CALCIUM 9.8 09/30/2021   GFRAA  02/20/2009    >60        The eGFR has been calculated using the MDRD equation. This calculation has not been validated in all clinical situations. eGFR's persistently <60 mL/min signify possible Chronic Kidney Disease.   [facility] NEGATIVE 07/08/2021    Speciality Comments: PLQ Eye Exam 09/23/2021 normal [facility] Ophthalmology f/u 12 months.  Procedures:  No procedures performed Allergies: Other and Shellfish allergy   Assessment / Plan:      Visit Diagnoses: Polymyalgia rheumatica ([place]) - History of myalgias since February 2023 he had difficulty getting of the bed and also getting out of the chair at the time.  He was diagnosed with PMR by his PCP.  He has been on tapering dose of prednisone which she has been tolerating well.  He is currently on prednisone 4 mg p.o. daily.  He is tapering prednisone by 1 mg every 2 months.  He denies any muscular weakness or tenderness.  Elevated CK - CK was elevated at 381 on November 01, 2021.  He also works out at [employer] 3 times a week and lifts heavy weights.  In August myositis panel HMGCR antibodies were negative.-He denies any muscular weakness.  Plan: CK  Other fatigue-improved.  High risk medication use - Plaquenil 200 mg p.o. twice daily.  Prednisone 4 mg p.o. daily currently. PLQ Eye Exam 09/23/2021 - Plan: CBC with Differential/Platelet, COMPLETE METABOLIC PANEL WITH GFR today and every 5 months.  Information on immunization was placed in the AVS.  Sarcoidosis - Diagnosed 2001, positive lymph node biopsy.  Treated with prednisone for 3 months.  Pulmonary symptoms have been in remission.  He denies any shortness of breath today.  Chronic idiopathic gout involving toe of left foot without tophus - Related to diet.  His uric acid has been normal.  He is not taking any treatment.  Chondromalacia patellae, right knee - Noted on previous x-rays.  He has done better with the exercises.  DDD (degenerative disc disease), cervical - X-rays from February 2023 showed multilevel spondylosis.  He takes gabapentin for pain relief.  DDD (degenerative disc disease), lumbar - Levoscoliosis and multilevel spondylosis was noted on previous x-rays.  It could mobility in the lumbar spine.  Stage 3a chronic kidney disease (HCC)-grade 1 was elevated.  We will recheck levels today.  Primary hypertension-has been Ro was elevated.  He was advised to monitor blood pressure closely and follow-up with his  PCP.  Vitamin D deficiency -his vitamin D was low in the past.  We will recheck vitamin D level today.  Plan: VITAMIN D 25 Hydroxy (Vit-D Deficiency, Fractures)  History of asthma  NASH (nonalcoholic steatohepatitis)-dietary modifications were discussed.  History of hypothyroidism  OSA on CPAP  Multinodular goiter  Orders: Orders Placed This Encounter  Procedures   CBC with Differential/Platelet   COMPLETE METABOLIC PANEL WITH GFR   CK   VITAMIN D 25 Hydroxy (Vit-D Deficiency, Fractures)   No orders of the defined types were placed in this encounter.    Follow-Up Instructions: Return in about 4 months (around 05/11/2022) for Polymyalgia rheumatica, Sarcoidosis, Osteoarthritis.   Bo Merino, MD  Note - This record has been created using Editor, commissioning.  Chart creation errors have been sought, but may not always  have been located. Such creation errors do not reflect on  the standard of medical care.

## 2021-12-27 NOTE — Therapy (Signed)
OUTPATIENT PHYSICAL THERAPY TREATMENT NOTE   Patient Name: Philip Woodard MRN: [id number] DOB:10-30-1950, 71 y.o., male Today's Date: 12/28/2021  PCP: Georgina Quint, MD   REFERRING PROVIDER: Pollyann Savoy, MD  END OF SESSION:   PT End of Session - 12/28/21 0923     Visit Number 6    Number of Visits 9    Date for PT Re-Evaluation 02/15/22    Authorization Type [employer]    Progress Note Due on Visit 10    PT Start Time 0925    PT Stop Time 1007    PT Time Calculation (min) 42 min    Activity Tolerance Patient tolerated treatment well    Behavior During Therapy [facility] for tasks assessed/performed                 Past Medical History:  Diagnosis Date   Alkaline phosphatase raised 06/28/2011   Fluctuating - suspect due to sarcoidosis   Allergic rhinitis    Allergy    Arthritis    knees   Genital herpes    Hemorrhoids    Hyperglycemia    Hyperplastic lymph node 06/27/2011   Submental node excised 1/02; regrowth: re-excision 10/06   Hypertension    Hypothyroidism    Multinodular goiter    Routine general medical examination at a health care facility    Sarcoidosis    Sleep apnea    wears cpap    Past Surgical History:  Procedure Laterality Date   COLONOSCOPY  5-23/2003   COLONOSCOPY  02/04/2018   TA   COLONOSCOPY WITH PROPOFOL  05/26/2021   [facility]   POLYPECTOMY     ROTATOR CUFF REPAIR Bilateral [id number]   THYROIDECTOMY  2002   Patient Active Problem List   Diagnosis Date Noted   Cervical radiculopathy 10/17/2021   Facial pain, acute 10/17/2021   Dyspepsia 07/20/2021   Lower urinary tract symptoms 07/20/2021   Cervical strain 04/27/2021   Chronic idiopathic gout involving toe of left foot without tophus 04/27/2021   Stage 3a chronic kidney disease (HCC) 03/31/2021   Erectile dysfunction due to arterial insufficiency 03/31/2021   NASH (nonalcoholic steatohepatitis) 02/22/2015   Arthritis of right lower extremity  07/20/2014   Hearing loss 02/19/2014   OSA on CPAP 05/02/2013   Hypothyroid 03/29/2012   Primary hypertension 11/24/2008   HYPOTHYROIDISM, POSTSURGICAL 07/07/2008   HYPERTROPHY PROSTATE W/UR OBST & OTH LUTS 07/07/2008   DISC DISEASE, LUMBAR 12/27/2007   Sarcoidosis 06/06/2007   GOITER, MULTINODULAR 06/06/2007   HEMORRHOIDS 06/06/2007   ASTHMA 06/06/2007    REFERRING DIAG: M50.30 (ICD-10-CM) - DDD (degenerative disc disease), cervical  THERAPY DIAG:  Cervicalgia  Abnormal posture  Rationale for Evaluation and Treatment Rehabilitation  PERTINENT HISTORY: HTN, CKD, sarcoidosis  PRECAUTIONS: none  SUBJECTIVE:  Pt arrives with report of no sharp pains since last session, but has had some increases in L eye/face discomfort that sustain a bit more, rated at about 4/10 presently.  Pt states the discomfort tends to be more notable in the morning and he questions if it has to do with sleeping position.    PAIN:  Are you having pain: mild discomfort 4/10 Location: L side of head, neck, and shoulder  How would you describe your pain? Sharp pain + some numbness/discomfort Best: 0/10 Worst: 6/10 Aggravating factors: pain with ocular movement towards R, pain with head movement towards R Easing factors: avoiding movement, rest. Symptoms ease quickly after onset   OBJECTIVE: (objective measures completed  at initial evaluation unless otherwise dated)   DIAGNOSTIC FINDINGS:  Cervical x ray 04/27/21 FINDINGS: There is no evidence of cervical spine fracture or prevertebral soft tissue swelling. Alignment is normal. Multilevel degenerate disc disease with disc space narrowing and marginal osteophytes.   IMPRESSION: 1.  No evidence of acute fracture or subluxation.   2.  Mild multilevel degenerative disc disease of the cervical spine.   PATIENT SURVEYS:  FOTO 63  12/21/21: 64   COGNITION: Overall cognitive status: Within functional limits for tasks assessed      SENSATION/NEURO: Light touch WNL and symmetrical UE/LE Unremarkable for dysdiadochokinesia testing Negative ataxia/tremors Consistent overshooting of L eye with ocular adduction during saccade testing Smooth pursuits unremarkable in all directions Concordant L eye discomfort noted with convergence testing  12/21/21: light touch WNL and symmetrical Unremarkable for dysdiadochokinesia testing Negative ataxia/tremors Convergence testing unremarkable with no eye discomfort  Questionable overshooting of L eye with occular abduction during saccade testing, less consistent than on eval   POSTURE: rounded shoulders and forward head, excessive UT elevation   PALPATION: Palpable nontender tightness B upper trap, levator scap, and rhomboids.      CERVICAL ROM:    Active ROM A/PROM (deg) eval AROM 12/21/21  Flexion 75% 100% mild stiffness  Extension 75% 75%  Right lateral flexion     Left lateral flexion     Right rotation 36 41  Left rotation 30 28   (Blank rows = not tested) Comments: stiffness L side with flex/ext, no pain. Nonpainful stretching on L side with B rotation   UPPER EXTREMITY ROM:   Active ROM Right eval Left eval  Shoulder flexion [facility]  Shoulder abduction      Shoulder internal rotation      Shoulder external rotation      Elbow flexion      Elbow extension      Wrist flexion      Wrist extension       (Blank rows = not tested) Comments: painless shoulder elevation   UPPER EXTREMITY MMT:   MMT Right eval Left eval  Shoulder flexion 5 5  Shoulder extension      Shoulder abduction 5 5  Shoulder extension      Shoulder internal rotation 5 5  Shoulder external rotation 5 5  Elbow flexion 5 5  Elbow extension 5 5  Grip strength       (Blank rows = not tested)          Comments: no pain with above   CERVICAL SPECIAL TESTS:  Spurling's test: Positive and Sharp pursor's test: Negative                       Sharps Pursor test: unremarkable                        Spurlings test: Concordant pain reproduced with lateral flexion + pressure BIL     TODAY'S TREATMENT:  [facility] Adult PT Treatment:                                                DATE: 12/28/21 Therapeutic Exercise: Swiss ball up wall push + lift x10 Cat/camel x12 Bent DB row 25# each UE 3x8 Machine row 55# x12, 65# x10, 75# x8 Levator stretch 3x30sec B Neuromuscular  re-ed: KB waiter's carry 5# 2x88ft each UE, cues for appropriate shoulder/elbow positioning 10# DB shrugs 2x10 B emphasis on eccentric portion and breath control Chin tuck w/ ball at wall + B scaption 2# each UE 2x10  [facility] Adult PT Treatment:                                                DATE: 12/21/21 Therapeutic Exercise: Swiss ball roll + push + lift off at wall, x10 Cat/camel x15 Cable column reverse fly 3# each UE, 2x12 Cable column wide row 10# 2x10 DB shrugs 8# each UE, 2x12 LS stretch 3x30sec B cues for form  [facility] Adult PT Treatment:                                                DATE: 12/19/21 Therapeutic Exercise: Cat/camel x15 Swiss ball flexion at wall, push  lift off x10 BUE Waiter's carry 5# KB 3x62ft Bent reverse fly unilat w/ UE support, 3# DB, 2x8 cues for form, setup, and pacing Neuromuscular re-ed: Chin tuck w ball at wall + B scaption 2x10 for DNF activation Cable column unilat wide row B, 17#B x8, 13#B 2x8 Cable column tall kneeling high>low rows 13# 3x10 Machine rows 45# x15, 55# 2x15     PATIENT EDUCATION:  Education details:rationale for interventions, PT progress Person educated: Patient Education method: Explanation, Demonstration, Tactile cues, Verbal cues Education comprehension: verbalized understanding, returned demonstration, verbal cues required, tactile cues required, and needs further education      HOME EXERCISE PROGRAM: Access Code: [email or web address] URL: [email or web address] Date: 11/25/2021 Prepared by: Fransisco Hertz   Exercises - Seated Cervical  Retraction  - 1 x daily - 7 x weekly - 3 sets - 10 reps - Seated Scapular Retraction  - 1 x daily - 7 x weekly - 3 sets - 10 reps   ASSESSMENT:   CLINICAL IMPRESSION: Patient is a pleasant 71 y.o. gentleman who was seen today for physical therapy treatment of ongoing neck/head/eye pain. Pt arrives without distinct pain, states he has not had any sharp pains since last session, although he has had some increases in eye/face discomfort that tend to occur in the mornings and improve as day goes on. Pt rates discomfort at 4/10 on arrival (although he denies any overt pain) - tolerates session quite well with progression for intensity/volume with periscapular/cervical strength/activation exercises, reports gradual improvement in symptoms as session goes on. Intermittent cues required for form. Pt denies any pain on departure, improvement in discomfort to 3/10 in intensity; tolerates session well with no adverse events.   Recommend skilled PT to address aforementioned deficits to maximize functional independence and tolerance. Pt departs today's session in no acute distress, all voiced questions/concerns addressed appropriately from PT perspective.         OBJECTIVE IMPAIRMENTS decreased mobility, decreased ROM, postural dysfunction, and pain.    ACTIVITY LIMITATIONS reach over head   PARTICIPATION LIMITATIONS: driving and community activity   PERSONAL FACTORS Time since onset of injury/illness/exacerbation and 3+ comorbidities: HTN, CKD, sarcoidosis  are also affecting patient's functional outcome.    REHAB POTENTIAL: Good   CLINICAL DECISION MAKING: Evolving/moderate complexity   EVALUATION COMPLEXITY: Moderate     GOALS: Goals reviewed  with patient? Yes   SHORT TERM GOALS: Target date: 12/09/2021    Pt will demonstrate appropriate understanding and performance of initially prescribed HEP in order to facilitate improved independence with management of symptoms.  Baseline: HEP provided on  eval 12/21/21: independent w/ HEP Goal status: MET   2. Pt will score greater than or equal to 65 on FOTO in order to demonstrate improved perception of function due to symptoms.            Baseline: 63  12/21/21: 64            Goal status: ongoing     LONG TERM GOALS: Target date: 12/23/2021 (extend to 01/20/22 as of 12/21/21)   Pt will score 68 on FOTO in order to demonstrate improved perception of function due to symptoms. Baseline: 63 12/21/21: ongoing Goal status: INITIAL   Pt will demonstrate at least 60 degrees of active cervical rotation ROM with less than 3/10 pain on NPS in order to demonstrate improved environmental awareness and safety with driving.  Baseline: 30 to L, 36 towards R 12/21/21: 28 to L, 41 to R; no pain with either Goal status: ongoing   3. Pt will report/demonstrate ability to look towards R with less than 2 point increase on NPS in order to demonstrate improved safety and environmental awareness.             Baseline: up to 8/10 reported  12/21/21: no pain with looking towards R            Goal status: MET       PLAN: PT FREQUENCY: 2x/week with aim of tapering to 1x/week   PT DURATION: 4 weeks   PLANNED INTERVENTIONS: Therapeutic exercises, Therapeutic activity, Neuromuscular re-education, Balance training, Gait training, Patient/Family education, Self Care, Joint mobilization, Vestibular training, Canalith repositioning, Aquatic Therapy, Dry Needling, Spinal mobilization, Cryotherapy, Moist heat, Manual therapy, and Re-evaluation   PLAN FOR NEXT SESSION: continue strength/stability exercises as able appropriate, progress cervical mobility exercises as able/appropriate    Ashley Murrain PT, DPT 12/28/2021 10:10 AM

## 2021-12-28 ENCOUNTER — Encounter: Payer: Self-pay | Admitting: Physical Therapy

## 2021-12-28 ENCOUNTER — Ambulatory Visit: Payer: Medicare HMO | Admitting: Physical Therapy

## 2021-12-28 ENCOUNTER — Other Ambulatory Visit: Payer: Self-pay | Admitting: Emergency Medicine

## 2021-12-28 DIAGNOSIS — M542 Cervicalgia: Secondary | ICD-10-CM | POA: Diagnosis not present

## 2021-12-28 DIAGNOSIS — R293 Abnormal posture: Secondary | ICD-10-CM

## 2022-01-02 ENCOUNTER — Other Ambulatory Visit: Payer: Self-pay | Admitting: Rheumatology

## 2022-01-02 NOTE — Telephone Encounter (Signed)
Please clarify what dose of prednisone he is currently taking and when he will be reducing the dose next

## 2022-01-02 NOTE — Telephone Encounter (Signed)
Next Visit: 01/10/2022  Last Visit: 11/01/2021  Last Fill: 11/01/2021  Dx: Myalgia   Current Dose per office note on 11/01/2021: reducing prednisone to 4 mg p.o. daily for the next 2 months.  I will repeat  labs after 2 months.  Okay to refill Prednisone?

## 2022-01-02 NOTE — Telephone Encounter (Signed)
Ok to continue on current dose of prednisone until follow up visit.   Patient will have updated lab work at that time.

## 2022-01-02 NOTE — Telephone Encounter (Signed)
Attempted to contact the patient and left message for patient to call the office.  

## 2022-01-02 NOTE — Telephone Encounter (Signed)
Spoke with patient and he states he is currently on 4 mg of Prednisone. Patient states he has been on that dose since 11/01/2021. Patient states he is not sure when he is supposed to reduce his dose. Patient advised per office he should be reducing his Prednisone every 2 months. Patient states he has done well on the 4 mg.   What dose of Prednisone would you like to send in for patient?

## 2022-01-04 ENCOUNTER — Telehealth: Payer: Self-pay

## 2022-01-04 ENCOUNTER — Ambulatory Visit: Payer: Medicare HMO | Admitting: Gastroenterology

## 2022-01-04 DIAGNOSIS — R1310 Dysphagia, unspecified: Secondary | ICD-10-CM

## 2022-01-04 NOTE — Progress Notes (Unsigned)
Vitals-DT  Pt's states no medical or surgical changes since previsit or office visit.   Patient ate today, and he is to be rescheduled.  CW will do that.

## 2022-01-04 NOTE — Telephone Encounter (Signed)
Pt arrived at [facility], pt had eaten a sausage biscuit and had coffee with creamer this morning at 8am. Spoke with MD, and advised to reschedule procedure for pt safety.

## 2022-01-06 NOTE — Therapy (Signed)
OUTPATIENT PHYSICAL THERAPY TREATMENT NOTE   Patient Name: Philip Woodard MRN: [id number] DOB:08-26-1950, 71 y.o., male Today's Date: 01/09/2022  PCP: Horald Pollen, MD   REFERRING PROVIDER: Bo Merino, MD  END OF SESSION:   PT End of Session - 01/09/22 1415     Visit Number 7    Number of Visits 9    Date for PT Re-Evaluation 02/15/22    Authorization Type [employer]    Progress Note Due on Visit 10    PT Start Time 1415    PT Stop Time 1501    PT Time Calculation (min) 46 min    Activity Tolerance Patient tolerated treatment well    Behavior During Therapy WFL for tasks assessed/performed                  Past Medical History:  Diagnosis Date   Alkaline phosphatase raised 06/28/2011   Fluctuating - suspect due to sarcoidosis   Allergic rhinitis    Allergy    Arthritis    knees   Genital herpes    Hemorrhoids    Hyperglycemia    Hyperplastic lymph node 06/27/2011   Submental node excised 1/02; regrowth: re-excision 10/06   Hypertension    Hypothyroidism    Multinodular goiter    Routine general medical examination at a health care facility    Sarcoidosis    Sleep apnea    wears cpap    Past Surgical History:  Procedure Laterality Date   COLONOSCOPY  5-23/2003   COLONOSCOPY  02/04/2018   TA   COLONOSCOPY WITH PROPOFOL  05/26/2021   [facility] at [place] Bilateral [phone]   THYROIDECTOMY  2002   Patient Active Problem List   Diagnosis Date Noted   Cervical radiculopathy 10/17/2021   Facial pain, acute 10/17/2021   Dyspepsia 07/20/2021   Lower urinary tract symptoms 07/20/2021   Cervical strain 04/27/2021   Chronic idiopathic gout involving toe of left foot without tophus 04/27/2021   Stage 3a chronic kidney disease ([place]) 03/31/2021   Erectile dysfunction due to arterial insufficiency 03/31/2021   NASH (nonalcoholic steatohepatitis) 02/22/2015   Arthritis of right lower extremity  07/20/2014   Hearing loss 02/19/2014   OSA on CPAP 05/02/2013   Hypothyroid 03/29/2012   Primary hypertension 11/24/2008   HYPOTHYROIDISM, POSTSURGICAL 07/07/2008   HYPERTROPHY PROSTATE W/UR OBST & OTH LUTS 07/07/2008   [place] DISEASE, LUMBAR 12/27/2007   Sarcoidosis 06/06/2007   GOITER, MULTINODULAR 06/06/2007   HEMORRHOIDS 06/06/2007   ASTHMA 06/06/2007    REFERRING DIAG: M50.30 (ICD-10-CM) - DDD (degenerative disc disease), cervical  THERAPY DIAG:  Cervicalgia  Abnormal posture  Rationale for Evaluation and Treatment Rehabilitation  PERTINENT HISTORY: HTN, CKD, sarcoidosis  PRECAUTIONS: none  SUBJECTIVE:  Pt states that he has had ~3 instances of concordant pain with looking toward L, and two new instances of pain on R side while looking to L, feels the same as initial symptoms. Otherwise no new complaints, states HEP has been going well, follows up with MD tomorrow.    PAIN:  Are you having pain: none Location: L side of head, neck, and shoulder  How would you describe your pain? Sharp pain + some numbness/discomfort Best: 0/10 Worst: 3/10 Aggravating factors: pain with ocular movement towards R, pain with head movement towards R Easing factors: avoiding movement, rest. Symptoms ease quickly after onset   OBJECTIVE: (objective measures completed at initial evaluation unless otherwise dated)  DIAGNOSTIC FINDINGS:  Cervical x ray 04/27/21 FINDINGS: There is no evidence of cervical spine fracture or prevertebral soft tissue swelling. Alignment is normal. Multilevel degenerate disc disease with disc space narrowing and marginal osteophytes.   IMPRESSION: 1.  No evidence of acute fracture or subluxation.   2.  Mild multilevel degenerative disc disease of the cervical spine.   PATIENT SURVEYS:  FOTO 63  12/21/21: 66   COGNITION: Overall cognitive status: Within functional limits for tasks assessed     SENSATION/NEURO: Light touch WNL and symmetrical  UE/LE Unremarkable for dysdiadochokinesia testing Negative ataxia/tremors Consistent overshooting of L eye with ocular adduction during saccade testing Smooth pursuits unremarkable in all directions Concordant L eye discomfort noted with convergence testing  12/21/21: light touch WNL and symmetrical Unremarkable for dysdiadochokinesia testing Negative ataxia/tremors Convergence testing unremarkable with no eye discomfort  Questionable overshooting of L eye with occular abduction during saccade testing, less consistent than on eval   POSTURE: rounded shoulders and forward head, excessive UT elevation   PALPATION: Palpable nontender tightness B upper trap, levator scap, and rhomboids.      CERVICAL ROM:    Active ROM A/PROM (deg) eval AROM 12/21/21  Flexion 75% 100% mild stiffness  Extension 75% 75%  Right lateral flexion     Left lateral flexion     Right rotation 36 41  Left rotation 30 28   (Blank rows = not tested) Comments: stiffness L side with flex/ext, no pain. Nonpainful stretching on L side with B rotation   UPPER EXTREMITY ROM:   Active ROM Right eval Left eval  Shoulder flexion [facility]  Shoulder abduction      Shoulder internal rotation      Shoulder external rotation      Elbow flexion      Elbow extension      Wrist flexion      Wrist extension       (Blank rows = not tested) Comments: painless shoulder elevation   UPPER EXTREMITY MMT:   MMT Right eval Left eval  Shoulder flexion 5 5  Shoulder extension      Shoulder abduction 5 5  Shoulder extension      Shoulder internal rotation 5 5  Shoulder external rotation 5 5  Elbow flexion 5 5  Elbow extension 5 5  Grip strength       (Blank rows = not tested)          Comments: no pain with above   CERVICAL SPECIAL TESTS:  Spurling's test: Positive and Sharp pursor's test: Negative                       Sharps Pursor test: unremarkable                       Spurlings test: Concordant pain  reproduced with lateral flexion + pressure BIL     TODAY'S TREATMENT:  [facility] Adult PT Treatment:                             DATE: 01/09/22 Therapeutic Exercise: Swiss ball up wall push + lift x15 Cable column high row 7# each UE, x12; 10# each 2x12, cues for posture  3# each UE, Cable column reverse fly, 2x12, cues for form and reduced compensations at elbow/UT Unilat high>low row 17# x10 each UE, 20# x10 Seated machine row, neutral grip, 65# x10, 75# 2x10,  cues for  Seated LS stretch x30sec B, cues for form and setup  Neuromuscular re-ed: Chin tuck at wall w/ ball + B 3# scaption, 3x8 for improved dynamic cervical stability Shoulder shrugs 3x8 15# DB each UE, for improved motor control of UT, emphasis on eccentric portion 5# KB waiter's carry, 3x49f each UE, for improved overhead stability   [facility] Adult PT Treatment:                                                DATE: 12/28/21 Therapeutic Exercise: Swiss ball up wall push + lift x10 Cat/camel x12 Bent DB row 25# each UE 3x8 Machine row 55# x12, 65# x10, 75# x8 Levator stretch 3x30sec B Neuromuscular re-ed: KB waiter's carry 5# 2x567feach UE, cues for appropriate shoulder/elbow positioning 10# DB shrugs 2x10 B emphasis on eccentric portion and breath control Chin tuck w/ ball at wall + B scaption 2# each UE 2x10  [facility] Adult PT Treatment:                                                DATE: 12/21/21 Therapeutic Exercise: Swiss ball roll + push + lift off at wall, x10 Cat/camel x15 Cable column reverse fly 3# each UE, 2x12 Cable column wide row 10# 2x10 DB shrugs 8# each UE, 2x12 LS stretch 3x30sec B cues for form      PATIENT EDUCATION:  Education details: rationale for interventions, PT progress Person educated: Patient Education method: Explanation, Demonstration, Tactile cues, Verbal cues Education comprehension: verbalized understanding, returned demonstration, verbal cues required, tactile cues required, and needs  further education      HOME EXERCISE PROGRAM: Access Code: [email or web address]: [email or web address] Date: 11/25/2021 Prepared by: DaEnis Slipper Exercises - Seated Cervical Retraction  - 1 x daily - 7 x weekly - 3 sets - 10 reps - Seated Scapular Retraction  - 1 x daily - 7 x weekly - 3 sets - 10 reps   ASSESSMENT:   CLINICAL IMPRESSION: Patient is a pleasant [age].o. gentleman who was seen today for physical therapy treatment of ongoing neck/head/eye pain. Pt reports some instances of contralateral pain over last week, similar to initial symptoms, advised to discuss with MD at visit tomorrow. Pt continues to progress well for increased  resistance/volume with program, emphasis on improving postural extension. Pt does have one instance of sharp pain in L side while looking to R - does not occur during exercise, occurs near end of session with pt quickly looking to R during conversation. Pain is very brief and does not occur with attempts at reproduction. Pt tolerates session well overall with no pain aside from aforementioned occurrence, no adverse events. Continues to demonstrate significant kyphotic posture and reduction in cervical mobility. Pt departs today's session in no acute distress, all voiced questions/concerns addressed appropriately from PT perspective.        OBJECTIVE IMPAIRMENTS decreased mobility, decreased ROM, postural dysfunction, and pain.    ACTIVITY LIMITATIONS reach over head   PARTICIPATION LIMITATIONS: driving and community activity   PERSONAL FACTORS Time since onset of injury/illness/exacerbation and 3+ comorbidities: HTN, CKD, sarcoidosis  are also affecting patient's functional outcome.    REHAB POTENTIAL: Good   CLINICAL  DECISION MAKING: Evolving/moderate complexity   EVALUATION COMPLEXITY: Moderate     GOALS: Goals reviewed with patient? Yes   SHORT TERM GOALS: Target date: 12/09/2021    Pt will demonstrate appropriate understanding and  performance of initially prescribed HEP in order to facilitate improved independence with management of symptoms.  Baseline: HEP provided on eval 12/21/21: independent w/ HEP Goal status: MET   2. Pt will score greater than or equal to 65 on FOTO in order to demonstrate improved perception of function due to symptoms.            Baseline: 63  12/21/21: 64            Goal status: ongoing     LONG TERM GOALS: Target date: 12/23/2021 (extend to 01/20/22 as of 12/21/21)   Pt will score 68 on FOTO in order to demonstrate improved perception of function due to symptoms. Baseline: 63 12/21/21: ongoing Goal status: INITIAL   Pt will demonstrate at least 60 degrees of active cervical rotation ROM with less than 3/10 pain on NPS in order to demonstrate improved environmental awareness and safety with driving.  Baseline: 30 to L, 36 towards R 12/21/21: 28 to L, 41 to R; no pain with either Goal status: ongoing   3. Pt will report/demonstrate ability to look towards R with less than 2 point increase on NPS in order to demonstrate improved safety and environmental awareness.             Baseline: up to 8/10 reported  12/21/21: no pain with looking towards R            Goal status: MET       PLAN: PT FREQUENCY: 2x/week with aim of tapering to 1x/week   PT DURATION: 4 weeks   PLANNED INTERVENTIONS: Therapeutic exercises, Therapeutic activity, Neuromuscular re-education, Balance training, Gait training, Patient/Family education, Self Care, Joint mobilization, Vestibular training, Canalith repositioning, Aquatic Therapy, Dry Needling, Spinal mobilization, Cryotherapy, Moist heat, Manual therapy, and Re-evaluation   PLAN FOR NEXT SESSION:  continue strength/stability exercises as able appropriate, progress cervical mobility exercises as able/appropriate    Leeroy Cha PT, DPT 01/09/2022 3:16 PM

## 2022-01-09 ENCOUNTER — Ambulatory Visit: Payer: Medicare HMO | Admitting: Physical Therapy

## 2022-01-09 ENCOUNTER — Encounter: Payer: Self-pay | Admitting: Physical Therapy

## 2022-01-09 DIAGNOSIS — M542 Cervicalgia: Secondary | ICD-10-CM

## 2022-01-09 DIAGNOSIS — R293 Abnormal posture: Secondary | ICD-10-CM

## 2022-01-10 ENCOUNTER — Encounter: Payer: Self-pay | Admitting: Rheumatology

## 2022-01-10 ENCOUNTER — Ambulatory Visit: Payer: Medicare HMO | Attending: Rheumatology | Admitting: Rheumatology

## 2022-01-10 VITALS — BP 142/75 | HR 89 | Resp 15 | Ht 67.0 in | Wt 198.6 lb

## 2022-01-10 DIAGNOSIS — G4733 Obstructive sleep apnea (adult) (pediatric): Secondary | ICD-10-CM

## 2022-01-10 DIAGNOSIS — Z79899 Other long term (current) drug therapy: Secondary | ICD-10-CM

## 2022-01-10 DIAGNOSIS — M353 Polymyalgia rheumatica: Secondary | ICD-10-CM | POA: Diagnosis not present

## 2022-01-10 DIAGNOSIS — E559 Vitamin D deficiency, unspecified: Secondary | ICD-10-CM | POA: Diagnosis not present

## 2022-01-10 DIAGNOSIS — M1A0720 Idiopathic chronic gout, left ankle and foot, without tophus (tophi): Secondary | ICD-10-CM

## 2022-01-10 DIAGNOSIS — M5030 Other cervical disc degeneration, unspecified cervical region: Secondary | ICD-10-CM | POA: Diagnosis not present

## 2022-01-10 DIAGNOSIS — E042 Nontoxic multinodular goiter: Secondary | ICD-10-CM

## 2022-01-10 DIAGNOSIS — R748 Abnormal levels of other serum enzymes: Secondary | ICD-10-CM

## 2022-01-10 DIAGNOSIS — R5383 Other fatigue: Secondary | ICD-10-CM

## 2022-01-10 DIAGNOSIS — M2241 Chondromalacia patellae, right knee: Secondary | ICD-10-CM

## 2022-01-10 DIAGNOSIS — N1831 Chronic kidney disease, stage 3a: Secondary | ICD-10-CM | POA: Diagnosis not present

## 2022-01-10 DIAGNOSIS — M5136 Other intervertebral disc degeneration, lumbar region: Secondary | ICD-10-CM | POA: Diagnosis not present

## 2022-01-10 DIAGNOSIS — I10 Essential (primary) hypertension: Secondary | ICD-10-CM

## 2022-01-10 DIAGNOSIS — Z8639 Personal history of other endocrine, nutritional and metabolic disease: Secondary | ICD-10-CM

## 2022-01-10 DIAGNOSIS — D869 Sarcoidosis, unspecified: Secondary | ICD-10-CM | POA: Diagnosis not present

## 2022-01-10 DIAGNOSIS — Z8709 Personal history of other diseases of the respiratory system: Secondary | ICD-10-CM

## 2022-01-10 DIAGNOSIS — M791 Myalgia, unspecified site: Secondary | ICD-10-CM

## 2022-01-10 DIAGNOSIS — K7581 Nonalcoholic steatohepatitis (NASH): Secondary | ICD-10-CM

## 2022-01-10 NOTE — Progress Notes (Signed)
CBC is normal, creatinine is higher than before.  I would recommend referral to nephrology.  Please discuss nephrology referral with the patient.  CK is elevated.  We will continue to monitor CK.  Vitamin D is pending.

## 2022-01-10 NOTE — Patient Instructions (Addendum)
Prednisone by 1 mg every 2 months.  Vaccines You are taking a medication(s) that can suppress your immune system.  The following immunizations are recommended: Flu annually Covid-19  Td/Tdap (tetanus, diphtheria, pertussis) every 10 years Pneumonia (Prevnar 15 then Pneumovax 23 at least 1 year apart.  Alternatively, can take Prevnar 20 without needing additional dose) Shingrix: 2 doses from 4 weeks to 6 months apart  Please check with your PCP to make sure you are up to date.

## 2022-01-11 ENCOUNTER — Other Ambulatory Visit: Payer: Self-pay | Admitting: *Deleted

## 2022-01-11 DIAGNOSIS — R748 Abnormal levels of other serum enzymes: Secondary | ICD-10-CM

## 2022-01-11 LAB — CBC WITH DIFFERENTIAL/PLATELET
Absolute Monocytes: 1135 cells/uL — ABNORMAL HIGH (ref 200–950)
Basophils Absolute: 77 cells/uL (ref 0–200)
Basophils Relative: 0.9 %
Eosinophils Absolute: 301 cells/uL (ref 15–500)
Eosinophils Relative: 3.5 %
HCT: 43.4 % (ref 38.5–50.0)
Hemoglobin: 14.2 g/dL (ref 13.2–17.1)
Lymphs Abs: 2322 cells/uL (ref 850–3900)
MCH: 30 pg (ref 27.0–33.0)
MCHC: 32.7 g/dL (ref 32.0–36.0)
MCV: 91.6 fL (ref 80.0–100.0)
MPV: 10.8 fL (ref 7.5–12.5)
Monocytes Relative: 13.2 %
Neutro Abs: 4764 cells/uL (ref 1500–7800)
Neutrophils Relative %: 55.4 %
Platelets: 276 10*3/uL (ref 140–400)
RBC: 4.74 10*6/uL (ref 4.20–5.80)
RDW: 13.8 % (ref 11.0–15.0)
Total Lymphocyte: 27 %
WBC: 8.6 10*3/uL (ref 3.8–10.8)

## 2022-01-11 LAB — COMPLETE METABOLIC PANEL WITH GFR
AG Ratio: 1.7 (calc) (ref 1.0–2.5)
ALT: 23 U/L (ref 9–46)
AST: 32 U/L (ref 10–35)
Albumin: 4.3 g/dL (ref 3.6–5.1)
Alkaline phosphatase (APISO): 67 U/L (ref 35–144)
BUN/Creatinine Ratio: 14 (calc) (ref 6–22)
BUN: 26 mg/dL — ABNORMAL HIGH (ref 7–25)
CO2: 30 mmol/L (ref 20–32)
Calcium: 9.9 mg/dL (ref 8.6–10.3)
Chloride: 106 mmol/L (ref 98–110)
Creat: 1.8 mg/dL — ABNORMAL HIGH (ref 0.70–1.35)
Globulin: 2.5 g/dL (calc) (ref 1.9–3.7)
Glucose, Bld: 86 mg/dL (ref 65–99)
Potassium: 4.6 mmol/L (ref 3.5–5.3)
Sodium: 143 mmol/L (ref 135–146)
Total Bilirubin: 0.7 mg/dL (ref 0.2–1.2)
Total Protein: 6.8 g/dL (ref 6.1–8.1)
eGFR: 40 mL/min/{1.73_m2} — ABNORMAL LOW (ref >=60)

## 2022-01-11 LAB — CK: Total CK: 531 U/L — ABNORMAL HIGH (ref 44–196)

## 2022-01-11 LAB — VITAMIN D 25 HYDROXY (VIT D DEFICIENCY, FRACTURES): Vit D, 25-Hydroxy: 32 ng/mL (ref 30–100)

## 2022-01-11 NOTE — Progress Notes (Signed)
Vitamin D is 32 which is low normal and stable.

## 2022-01-15 ENCOUNTER — Encounter: Payer: Self-pay | Admitting: Emergency Medicine

## 2022-01-16 ENCOUNTER — Ambulatory Visit: Payer: Medicare HMO | Attending: Rheumatology | Admitting: Physical Therapy

## 2022-01-16 ENCOUNTER — Encounter: Payer: Self-pay | Admitting: Physical Therapy

## 2022-01-16 DIAGNOSIS — R293 Abnormal posture: Secondary | ICD-10-CM

## 2022-01-16 DIAGNOSIS — M542 Cervicalgia: Secondary | ICD-10-CM | POA: Diagnosis not present

## 2022-01-16 NOTE — Telephone Encounter (Signed)
If persistent bleeding, ED evaluation recommended.

## 2022-01-16 NOTE — Therapy (Signed)
OUTPATIENT PHYSICAL THERAPY TREATMENT NOTE   Patient Name: Philip Woodard MRN: [id number] DOB:[birth year], 71 y.o., male Today's Date: 01/16/2022  PCP: Horald Pollen, MD   REFERRING PROVIDER: Bo Merino, MD  END OF SESSION:   PT End of Session - 01/16/22 1542     Visit Number 8    Number of Visits 9    Date for PT Re-Evaluation 02/15/22    Authorization Type [employer] Medicare    Progress Note Due on Visit 10    PT Start Time 1543    PT Stop Time 1627    PT Time Calculation (min) 44 min    Activity Tolerance Patient tolerated treatment well;No increased pain    Behavior During Therapy WFL for tasks assessed/performed                   Past Medical History:  Diagnosis Date   Alkaline phosphatase raised 06/28/2011   Fluctuating - suspect due to sarcoidosis   Allergic rhinitis    Allergy    Arthritis    knees   Genital herpes    Hemorrhoids    Hyperglycemia    Hyperplastic lymph node 06/27/2011   Submental node excised 1/02; regrowth: re-excision 10/06   Hypertension    Hypothyroidism    Multinodular goiter    Routine general medical examination at a health care facility    Sarcoidosis    Sleep apnea    wears cpap    Past Surgical History:  Procedure Laterality Date   COLONOSCOPY  5-23/2003   COLONOSCOPY  02/04/2018   TA   COLONOSCOPY WITH PROPOFOL  05/26/2021   [facility] at [place] Bilateral [phone]   THYROIDECTOMY  2002   Patient Active Problem List   Diagnosis Date Noted   Cervical radiculopathy 10/17/2021   Facial pain, acute 10/17/2021   Dyspepsia 07/20/2021   Lower urinary tract symptoms 07/20/2021   Cervical strain 04/27/2021   Chronic idiopathic gout involving toe of left foot without tophus 04/27/2021   Stage 3a chronic kidney disease ([place]) 03/31/2021   Erectile dysfunction due to arterial insufficiency 03/31/2021   NASH (nonalcoholic steatohepatitis) 02/22/2015   Arthritis of  right lower extremity 07/20/2014   Hearing loss 02/19/2014   OSA on CPAP 05/02/2013   Hypothyroid 03/29/2012   Primary hypertension 11/24/2008   HYPOTHYROIDISM, POSTSURGICAL 07/07/2008   HYPERTROPHY PROSTATE W/UR OBST & OTH LUTS 07/07/2008   [place] DISEASE, LUMBAR 12/27/2007   Sarcoidosis 06/06/2007   GOITER, MULTINODULAR 06/06/2007   HEMORRHOIDS 06/06/2007   ASTHMA 06/06/2007    REFERRING DIAG: M50.30 (ICD-10-CM) - DDD (degenerative disc disease), cervical  THERAPY DIAG:  Cervicalgia  Abnormal posture  Rationale for Evaluation and Treatment Rehabilitation  PERTINENT HISTORY: HTN, CKD, sarcoidosis  PRECAUTIONS: none  SUBJECTIVE:  Pt has been keeping a log of his pain over past week, continues to have most pain with looking/reaching towards R although he notes severity and frequency seem to be improving. Pt mentions that he had floaters 1 time and states he is going to reach out to his ophthalmologist regarding that and prior abnormal eye movements with visual screening.    PAIN:  Are you having pain: none Location: L side of head, neck, and shoulder  How would you describe your pain? Sharp pain + some numbness/discomfort Best: 0/10 Worst: 3/10 Aggravating factors: pain with ocular movement towards R, pain with head movement towards R Easing factors: avoiding movement, rest. Symptoms ease quickly  after onset   OBJECTIVE: (objective measures completed at initial evaluation unless otherwise dated)   DIAGNOSTIC FINDINGS:  Cervical x ray 04/27/21 FINDINGS: There is no evidence of cervical spine fracture or prevertebral soft tissue swelling. Alignment is normal. Multilevel degenerate disc disease with disc space narrowing and marginal osteophytes.   IMPRESSION: 1.  No evidence of acute fracture or subluxation.   2.  Mild multilevel degenerative disc disease of the cervical spine.   PATIENT SURVEYS:  FOTO 63  12/21/21: 30   COGNITION: Overall cognitive status:  Within functional limits for tasks assessed     SENSATION/NEURO: Light touch WNL and symmetrical UE/LE Unremarkable for dysdiadochokinesia testing Negative ataxia/tremors Consistent overshooting of L eye with ocular adduction during saccade testing Smooth pursuits unremarkable in all directions Concordant L eye discomfort noted with convergence testing  12/21/21: light touch WNL and symmetrical Unremarkable for dysdiadochokinesia testing Negative ataxia/tremors Convergence testing unremarkable with no eye discomfort  Questionable overshooting of L eye with occular abduction during saccade testing, less consistent than on eval   POSTURE: rounded shoulders and forward head, excessive UT elevation   PALPATION: Palpable nontender tightness B upper trap, levator scap, and rhomboids.      CERVICAL ROM:    Active ROM A/PROM (deg) eval AROM 12/21/21  Flexion 75% 100% mild stiffness  Extension 75% 75%  Right lateral flexion     Left lateral flexion     Right rotation 36 41  Left rotation 30 28   (Blank rows = not tested) Comments: stiffness L side with flex/ext, no pain. Nonpainful stretching on L side with B rotation   UPPER EXTREMITY ROM:   Active ROM Right eval Left eval  Shoulder flexion [facility]  Shoulder abduction      Shoulder internal rotation      Shoulder external rotation      Elbow flexion      Elbow extension      Wrist flexion      Wrist extension       (Blank rows = not tested) Comments: painless shoulder elevation   UPPER EXTREMITY MMT:   MMT Right eval Left eval  Shoulder flexion 5 5  Shoulder extension      Shoulder abduction 5 5  Shoulder extension      Shoulder internal rotation 5 5  Shoulder external rotation 5 5  Elbow flexion 5 5  Elbow extension 5 5  Grip strength       (Blank rows = not tested)          Comments: no pain with above   CERVICAL SPECIAL TESTS:  Spurling's test: Positive and Sharp pursor's test: Negative                        Sharps Pursor test: unremarkable                       Spurlings test: Concordant pain reproduced with lateral flexion + pressure BIL     TODAY'S TREATMENT:  [facility] Adult PT Treatment:                                                DATE: 01/16/22 Therapeutic Exercise: Swiss ball push + lift x12 each UE 20# cable column high/low row unilat, 2x15 each High rows cable column 10# 2x15,  cues for form Machine low row 55# x10, 65# x10, 75# x10 Lat pull down 45# 2x15, cues for pulling in front of head rather than behind Machine chest press 45# x10, 65# x15 Chin tuck ball at wall, 3# DB B 3 way (flex/scap/abd) 2x5, cues for form and pacing 5# KB waiter's carry 1x30f each UE, 10# KB 2x260f cues for posture    [facility] Adult PT Treatment:                             DATE: 01/09/22 Therapeutic Exercise: Swiss ball up wall push + lift x15 Cable column high row 7# each UE, x12; 10# each 2x12, cues for posture  3# each UE, Cable column reverse fly, 2x12, cues for form and reduced compensations at elbow/UT Unilat high>low row 17# x10 each UE, 20# x10 Seated machine row, neutral grip, 65# x10, 75# 2x10, cues for  Seated LS stretch x30sec B, cues for form and setup  Neuromuscular re-ed: Chin tuck at wall w/ ball + B 3# scaption, 3x8 for improved dynamic cervical stability Shoulder shrugs 3x8 15# DB each UE, for improved motor control of UT, emphasis on eccentric portion 5# KB waiter's carry, [id number] UE, for improved overhead stability   [facility] Adult PT Treatment:                                                DATE: 12/28/21 Therapeutic Exercise: Swiss ball up wall push + lift x10 Cat/camel x12 Bent DB row 25# each UE 3x8 Machine row 55# x12, 65# x10, 75# x8 Levator stretch 3x30sec B Neuromuscular re-ed: KB waiter's carry 5# [id number] UE, cues for appropriate shoulder/elbow positioning 10# DB shrugs 2x10 B emphasis on eccentric portion and breath control Chin tuck w/ ball at wall + B  scaption 2# each UE 2x10     PATIENT EDUCATION:  Education details: rationale for interventions, PT progress Person educated: Patient Education method: Explanation, Demonstration, Tactile cues, Verbal cues Education comprehension: verbalized understanding, returned demonstration, verbal cues required, tactile cues required, and needs further education      HOME EXERCISE PROGRAM: Access Code: [email or web address]: [email or web address] Date: 11/25/2021 Prepared by: DaviEnis Slipperxercises - Seated Cervical Retraction  - 1 x daily - 7 x weekly - 3 sets - 10 reps - Seated Scapular Retraction  - 1 x daily - 7 x weekly - 3 sets - 10 reps   ASSESSMENT:   CLINICAL IMPRESSION: Pt arrives without significant pain, verbally reviews log of pain over past week that he has been keeping. Pt states that he feels he is progressing relatively well and may be ready to discharge next session. Today's session emphasizing increasing independence with exercise program with education on exercise principles and safety w/ gym activities outside of PT sessions. Pt tolerates session quite well without increase in pain, no adverse events. Pt departs today's session in no acute distress, all voiced questions/concerns addressed appropriately from PT perspective.       OBJECTIVE IMPAIRMENTS decreased mobility, decreased ROM, postural dysfunction, and pain.    ACTIVITY LIMITATIONS reach over head   PARTICIPATION LIMITATIONS: driving and community activity   PERSONAL FACTORS Time since onset of injury/illness/exacerbation and 3+ comorbidities: HTN, CKD, sarcoidosis  are also affecting patient's functional outcome.    REHAB POTENTIAL: Good  CLINICAL DECISION MAKING: Evolving/moderate complexity   EVALUATION COMPLEXITY: Moderate     GOALS: Goals reviewed with patient? Yes   SHORT TERM GOALS: Target date: 12/09/2021    Pt will demonstrate appropriate understanding and performance of initially  prescribed HEP in order to facilitate improved independence with management of symptoms.  Baseline: HEP provided on eval 12/21/21: independent w/ HEP Goal status: MET   2. Pt will score greater than or equal to 65 on FOTO in order to demonstrate improved perception of function due to symptoms.            Baseline: 63  12/21/21: 64            Goal status: ongoing     LONG TERM GOALS: Target date: 12/23/2021 (extend to 01/20/22 as of 12/21/21)   Pt will score 68 on FOTO in order to demonstrate improved perception of function due to symptoms. Baseline: 63 12/21/21: ongoing Goal status: INITIAL   Pt will demonstrate at least 60 degrees of active cervical rotation ROM with less than 3/10 pain on NPS in order to demonstrate improved environmental awareness and safety with driving.  Baseline: 30 to L, 36 towards R 12/21/21: 28 to L, 41 to R; no pain with either Goal status: ongoing   3. Pt will report/demonstrate ability to look towards R with less than 2 point increase on NPS in order to demonstrate improved safety and environmental awareness.             Baseline: up to 8/10 reported  12/21/21: no pain with looking towards R            Goal status: MET       PLAN: PT FREQUENCY: 2x/week with aim of tapering to 1x/week   PT DURATION: 4 weeks   PLANNED INTERVENTIONS: Therapeutic exercises, Therapeutic activity, Neuromuscular re-education, Balance training, Gait training, Patient/Family education, Self Care, Joint mobilization, Vestibular training, Canalith repositioning, Aquatic Therapy, Dry Needling, Spinal mobilization, Cryotherapy, Moist heat, Manual therapy, and Re-evaluation   PLAN FOR NEXT SESSION:  re-assess vestibular screen, ensure independence w/ HEP, plan for tentative discharge   Leeroy Cha PT, DPT 01/16/2022 4:28 PM

## 2022-01-23 ENCOUNTER — Ambulatory Visit: Payer: Medicare HMO | Admitting: Emergency Medicine

## 2022-01-23 NOTE — Therapy (Signed)
OUTPATIENT PHYSICAL THERAPY TREATMENT NOTE + DISCHARGE SUMMARY   Patient Name: Philip Woodard MRN: [id number] DOB:08/26/1950, 71 y.o., male Today's Date: 01/24/2022  PHYSICAL THERAPY DISCHARGE SUMMARY  Visits from Start of Care: 9  Current functional level related to goals / functional outcomes: Pt denies any significant functional limitations; goals met as noted below   Remaining deficits: Reduced cervical mobility   Education / Equipment: HEP performance, follow up with MD, discharge education   Patient agrees to discharge. Patient goals were partially met. Patient is being discharged due to being pleased with the current functional level.; plateau in symptoms   PCP: Horald Pollen, MD   REFERRING PROVIDER: Bo Merino, MD  END OF SESSION:   PT End of Session - 01/24/22 1556     Visit Number 9    Number of Visits 9    Date for PT Re-Evaluation 02/15/22    Authorization Type [employer]    PT Start Time 0539    PT Stop Time 1624    PT Time Calculation (min) 27 min    Activity Tolerance No increased pain    Behavior During Therapy WFL for tasks assessed/performed                    Past Medical History:  Diagnosis Date   Alkaline phosphatase raised 06/28/2011   Fluctuating - suspect due to sarcoidosis   Allergic rhinitis    Allergy    Arthritis    knees   Genital herpes    Hemorrhoids    Hyperglycemia    Hyperplastic lymph node 06/27/2011   Submental node excised 1/02; regrowth: re-excision 10/06   Hypertension    Hypothyroidism    Multinodular goiter    Routine general medical examination at a health care facility    Sarcoidosis    Sleep apnea    wears cpap    Past Surgical History:  Procedure Laterality Date   COLONOSCOPY  5-23/2003   COLONOSCOPY  02/04/2018   TA   COLONOSCOPY WITH PROPOFOL  05/26/2021   [facility] [place] Bilateral [id number]   THYROIDECTOMY  2002   Patient  Active Problem List   Diagnosis Date Noted   Cervical radiculopathy 10/17/2021   Facial pain, acute 10/17/2021   Dyspepsia 07/20/2021   Lower urinary tract symptoms 07/20/2021   Cervical strain 04/27/2021   Chronic idiopathic gout involving toe of left foot without tophus 04/27/2021   Stage 3a chronic kidney disease ([place]) 03/31/2021   Erectile dysfunction due to arterial insufficiency 03/31/2021   NASH (nonalcoholic steatohepatitis) 02/22/2015   Arthritis of right lower extremity 07/20/2014   Hearing loss 02/19/2014   OSA on CPAP 05/02/2013   Hypothyroid 03/29/2012   Primary hypertension 11/24/2008   HYPOTHYROIDISM, POSTSURGICAL 07/07/2008   HYPERTROPHY PROSTATE W/UR OBST & OTH LUTS 07/07/2008   [place] DISEASE, LUMBAR 12/27/2007   Sarcoidosis 06/06/2007   GOITER, MULTINODULAR 06/06/2007   HEMORRHOIDS 06/06/2007   ASTHMA 06/06/2007    REFERRING DIAG: M50.30 (ICD-10-CM) - DDD (degenerative disc disease), cervical  THERAPY DIAG:  Cervicalgia  Abnormal posture  Rationale for Evaluation and Treatment Rehabilitation  PERTINENT HISTORY: HTN, CKD, sarcoidosis  PRECAUTIONS: none  SUBJECTIVE:  Pt arrives without overt symptoms, states symptoms remain intermittent and tend to happen mostly when turning head quickly. Denies any overt limitations but states he tends to be more cautious, particularly when turning head while driving. Reports good compliance with HEP and gym  program, states he feels comfortable with discharge at this time. Follows up with PCP tomorrow and states he will call his ophthalmologist tonight. Saw referring provide last week and no concerns.    PAIN:  Are you having pain: none Location: L side of head, neck, and shoulder  How would you describe your pain? Sharp pain + some numbness/discomfort Best: 0/10 Worst: 4/10 Aggravating factors: pain with ocular movement towards R, pain with head movement towards R Easing factors: avoiding movement, rest. Symptoms ease  quickly after onset   OBJECTIVE: (objective measures completed at initial evaluation unless otherwise dated)   DIAGNOSTIC FINDINGS:  Cervical x ray 04/27/21 FINDINGS: There is no evidence of cervical spine fracture or prevertebral soft tissue swelling. Alignment is normal. Multilevel degenerate disc disease with disc space narrowing and marginal osteophytes.   IMPRESSION: 1.  No evidence of acute fracture or subluxation.   2.  Mild multilevel degenerative disc disease of the cervical spine.   PATIENT SURVEYS:  FOTO 63  12/21/21: 64 01/24/22: 75    COGNITION: Overall cognitive status: Within functional limits for tasks assessed     SENSATION/NEURO: Light touch WNL and symmetrical UE/LE Unremarkable for dysdiadochokinesia testing Negative ataxia/tremors Consistent overshooting of L eye with ocular adduction during saccade testing Smooth pursuits unremarkable in all directions Concordant L eye discomfort noted with convergence testing  12/21/21: light touch WNL and symmetrical Unremarkable for dysdiadochokinesia testing Negative ataxia/tremors Convergence testing unremarkable with no eye discomfort  Questionable overshooting of L eye with occular abduction during saccade testing, less consistent than on eval  01/24/22:  Convergence testing unremarkable Smooth pursuits with overshooting L eye with ocular abduction Saccades also with overshooting L eye ocular abduction Unremarkable for dysdiadochokinesia testing Unremarkable ataxia/tremors    POSTURE: rounded shoulders and forward head, excessive UT elevation   PALPATION: Palpable nontender tightness B upper trap, levator scap, and rhomboids.      CERVICAL ROM:    Active ROM A/PROM (deg) eval AROM 12/21/21 AROM 01/24/22  Flexion 75% 100% mild stiffness 100%  Extension 75% 75% 75%  Right lateral flexion      Left lateral flexion      Right rotation 36 41 58  Left rotation 30 28 35   (Blank rows = not  tested) Comments: stiffness L side with flex/ext, no pain. Nonpainful stretching on L side with B rotation   UPPER EXTREMITY ROM:   Active ROM Right eval Left eval  Shoulder flexion [facility]  Shoulder abduction      Shoulder internal rotation      Shoulder external rotation      Elbow flexion      Elbow extension      Wrist flexion      Wrist extension       (Blank rows = not tested) Comments: painless shoulder elevation   UPPER EXTREMITY MMT:   MMT Right eval Left eval  Shoulder flexion 5 5  Shoulder extension      Shoulder abduction 5 5  Shoulder extension      Shoulder internal rotation 5 5  Shoulder external rotation 5 5  Elbow flexion 5 5  Elbow extension 5 5  Grip strength       (Blank rows = not tested)          Comments: no pain with above   CERVICAL SPECIAL TESTS:  Spurling's test: Positive and Sharp pursor's test: Negative  Sharps Pursor test: unremarkable                       Spurlings test: Concordant pain reproduced with lateral flexion + pressure BIL  negative spurlings test 01/24/22   TODAY'S TREATMENT:  [facility] Adult PT Treatment:                                                DATE: 01/24/22 Therapeutic Exercise: Swiss ball flexion up wall + lift up x10 each UE Chin tuck pillow standing at wall x10 Shoulder shrugs x15 cues for eccentric portion Levator scap stretch 30sec B  Self Care: Discharge education, principles for gym program, follow up with provider, safety w/ exercise  [facility] Adult PT Treatment:                                                DATE: 01/16/22 Therapeutic Exercise: Swiss ball push + lift x12 each UE 20# cable column high/low row unilat, 2x15 each High rows cable column 10# 2x15, cues for form Machine low row 55# x10, 65# x10, 75# x10 Lat pull down 45# 2x15, cues for pulling in front of head rather than behind Machine chest press 45# x10, 65# x15 Chin tuck ball at wall, 3# DB B 3 way (flex/scap/abd) 2x5,  cues for form and pacing 5# KB waiter's carry 1x39f each UE, 10# KB 2x241f cues for posture    [facility] Adult PT Treatment:                             DATE: 01/09/22 Therapeutic Exercise: Swiss ball up wall push + lift x15 Cable column high row 7# each UE, x12; 10# each 2x12, cues for posture  3# each UE, Cable column reverse fly, 2x12, cues for form and reduced compensations at elbow/UT Unilat high>low row 17# x10 each UE, 20# x10 Seated machine row, neutral grip, 65# x10, 75# 2x10, cues for  Seated LS stretch x30sec B, cues for form and setup  Neuromuscular re-ed: Chin tuck at wall w/ ball + B 3# scaption, 3x8 for improved dynamic cervical stability Shoulder shrugs 3x8 15# DB each UE, for improved motor control of UT, emphasis on eccentric portion 5# KB waiter's carry, [id number] UE, for improved overhead stability       PATIENT EDUCATION:  Education details: HEP performance, appropriate performance of gym program, monitoring symptoms, following up with provider. Also discussing vestibular/neuro exam and encouraged follow up with provider Person educated: Patient Education method: Explanation, Demonstration, Tactile cues, Verbal cues, handout Education comprehension: verbalized understanding, returned demonstration, verbal cues required, tactile cues required, and needs further education      HOME EXERCISE PROGRAM: Access Code: [email or web address]: [email or web address] Date: 01/24/2022 Prepared by: DavEnis Slipperxercises - Seated Levator Scapulae Stretch  - 1 x daily - 7 x weekly - 1 sets - 3 reps - 30sec hold - Standing shoulder flexion wall slides  - 1 x daily - 7 x weekly - 3 sets - 10 reps - Standing Isometric Cervical Retraction with Chin Tucks and Ball at [employer] 1 x daily - 7 x weekly - 3 sets - 10  reps - Seated Shoulder Shrugs  - 1 x daily - 7 x weekly - 3 sets - 10 reps   ASSESSMENT:   CLINICAL IMPRESSION: Pt arrives without significant pain, states he  has been doing well overall, just has to be more cautious with neck turns. Denies any limitations in daily activities, has still been going to gym. Pt agreeable to discharge at this time, denies any significant changes in symptoms over recent weeks. Since initial evaluation pt has noted improvement in symptoms overall and does demonstrate improved cervical mobility although remains limited. Excellent tolerance to exercise which he states improves symptoms. Pt tolerates HEP well and verbalizes/demonstrates good understanding of performance. Agreeable to discharge at this time, denies any concerns, all voiced questions addressed appropriately from PT perspective. Departs session in no acute distress      OBJECTIVE IMPAIRMENTS decreased mobility, decreased ROM, postural dysfunction, and pain.    ACTIVITY LIMITATIONS none   PARTICIPATION LIMITATIONS: driving and community activity   PERSONAL FACTORS Time since onset of injury/illness/exacerbation and 3+ comorbidities: HTN, CKD, sarcoidosis  are also affecting patient's functional outcome.    REHAB POTENTIAL: Good   CLINICAL DECISION MAKING: Evolving/moderate complexity   EVALUATION COMPLEXITY: Moderate     GOALS: Goals reviewed with patient? Yes   SHORT TERM GOALS: Target date: 12/09/2021    Pt will demonstrate appropriate understanding and performance of initially prescribed HEP in order to facilitate improved independence with management of symptoms.  Baseline: HEP provided on eval 12/21/21: independent w/ HEP Goal status: MET   2. Pt will score greater than or equal to 65 on FOTO in order to demonstrate improved perception of function due to symptoms.            Baseline: 63  12/21/21: 64   01/24/22: 67            Goal status: MET     LONG TERM GOALS: Target date: 12/23/2021 (extend to 01/20/22 as of 12/21/21)   Pt will score 68 on FOTO in order to demonstrate improved perception of function due to symptoms. Baseline:  63 12/21/21: ongoing 01/23/22: 67 Goal status: NEARLY MET   Pt will demonstrate at least 60 degrees of active cervical rotation ROM with less than 3/10 pain on NPS in order to demonstrate improved environmental awareness and safety with driving.  Baseline: 30 to L, 36 towards R 12/21/21: 28 to L, 41 to R; no pain with either 01/23/22: 35 to R, 58 to L Goal status: NOT MET   3. Pt will report/demonstrate ability to look towards R with less than 2 point increase on NPS in order to demonstrate improved safety and environmental awareness.             Baseline: up to 8/10 reported  12/21/21: no pain with looking towards R            Goal status: MET       PLAN (discharge on 01/24/22 ): PT FREQUENCY: n/a   PT DURATION: n/a   PLANNED INTERVENTIONS: Therapeutic exercises, Therapeutic activity, Neuromuscular re-education, Balance training, Gait training, Patient/Family education, Self Care, Joint mobilization, Vestibular training, Canalith repositioning, Aquatic Therapy, Dry Needling, Spinal mobilization, Cryotherapy, Moist heat, Manual therapy, and Re-evaluation   PLAN FOR NEXT SESSION:  discharge  Leeroy Cha PT, DPT 01/24/2022 5:44 PM

## 2022-01-24 ENCOUNTER — Ambulatory Visit: Payer: Medicare HMO | Admitting: Physical Therapy

## 2022-01-24 ENCOUNTER — Encounter: Payer: Self-pay | Admitting: Gastroenterology

## 2022-01-24 ENCOUNTER — Encounter: Payer: Self-pay | Admitting: Physical Therapy

## 2022-01-24 DIAGNOSIS — M542 Cervicalgia: Secondary | ICD-10-CM

## 2022-01-24 DIAGNOSIS — R293 Abnormal posture: Secondary | ICD-10-CM | POA: Diagnosis not present

## 2022-01-25 ENCOUNTER — Encounter: Payer: Self-pay | Admitting: Emergency Medicine

## 2022-01-25 ENCOUNTER — Ambulatory Visit (INDEPENDENT_AMBULATORY_CARE_PROVIDER_SITE_OTHER): Payer: Medicare HMO | Admitting: Emergency Medicine

## 2022-01-25 VITALS — BP 134/86 | HR 68 | Temp 98.1°F | Ht 67.0 in | Wt 201.1 lb

## 2022-01-25 DIAGNOSIS — K9041 Non-celiac gluten sensitivity: Secondary | ICD-10-CM

## 2022-01-25 DIAGNOSIS — Z23 Encounter for immunization: Secondary | ICD-10-CM

## 2022-01-25 DIAGNOSIS — K625 Hemorrhage of anus and rectum: Secondary | ICD-10-CM | POA: Diagnosis not present

## 2022-01-25 DIAGNOSIS — K648 Other hemorrhoids: Secondary | ICD-10-CM

## 2022-01-25 DIAGNOSIS — E890 Postprocedural hypothyroidism: Secondary | ICD-10-CM | POA: Diagnosis not present

## 2022-01-25 DIAGNOSIS — K7581 Nonalcoholic steatohepatitis (NASH): Secondary | ICD-10-CM | POA: Diagnosis not present

## 2022-01-25 DIAGNOSIS — G4733 Obstructive sleep apnea (adult) (pediatric): Secondary | ICD-10-CM | POA: Diagnosis not present

## 2022-01-25 DIAGNOSIS — I10 Essential (primary) hypertension: Secondary | ICD-10-CM | POA: Diagnosis not present

## 2022-01-25 DIAGNOSIS — N1831 Chronic kidney disease, stage 3a: Secondary | ICD-10-CM | POA: Diagnosis not present

## 2022-01-25 LAB — CBC WITH DIFFERENTIAL/PLATELET
Basophils Absolute: 0 10*3/uL (ref 0.0–0.1)
Basophils Relative: 0.7 % (ref 0.0–3.0)
Eosinophils Absolute: 0.2 10*3/uL (ref 0.0–0.7)
Eosinophils Relative: 2.9 % (ref 0.0–5.0)
HCT: 41.3 % (ref 39.0–52.0)
Hemoglobin: 13.7 g/dL (ref 13.0–17.0)
Lymphocytes Relative: 24.1 % (ref 12.0–46.0)
Lymphs Abs: 1.6 10*3/uL (ref 0.7–4.0)
MCHC: 33.1 g/dL (ref 30.0–36.0)
MCV: 91.3 fl (ref 78.0–100.0)
Monocytes Absolute: 0.8 10*3/uL (ref 0.1–1.0)
Monocytes Relative: 11.6 % (ref 3.0–12.0)
Neutro Abs: 4 10*3/uL (ref 1.4–7.7)
Neutrophils Relative %: 60.7 % (ref 43.0–77.0)
Platelets: 259 10*3/uL (ref 150.0–400.0)
RBC: 4.53 Mil/uL (ref 4.22–5.81)
RDW: 14.9 % (ref 11.5–15.5)
WBC: 6.6 10*3/uL (ref 4.0–10.5)

## 2022-01-25 LAB — COMPREHENSIVE METABOLIC PANEL
ALT: 51 U/L (ref 0–53)
AST: 163 U/L — ABNORMAL HIGH (ref 0–37)
Albumin: 4 g/dL (ref 3.5–5.2)
Alkaline Phosphatase: 61 U/L (ref 39–117)
BUN: 15 mg/dL (ref 6–23)
CO2: 31 mEq/L (ref 19–32)
Calcium: 9.1 mg/dL (ref 8.4–10.5)
Chloride: 107 mEq/L (ref 96–112)
Creatinine, Ser: 1.29 mg/dL (ref 0.40–1.50)
GFR: 56.53 mL/min — ABNORMAL LOW (ref >60.00)
Glucose, Bld: 84 mg/dL (ref 70–99)
Potassium: 3.9 mEq/L (ref 3.5–5.1)
Sodium: 141 mEq/L (ref 135–145)
Total Bilirubin: 1 mg/dL (ref 0.2–1.2)
Total Protein: 6.6 g/dL (ref 6.0–8.3)

## 2022-01-25 NOTE — Assessment & Plan Note (Signed)
Stable.  On CPAP treatment.

## 2022-01-25 NOTE — Assessment & Plan Note (Signed)
Stable.  Most likely secondary to internal hemorrhoids. However gluten sensitivity also contributing. No complications.  Presently not bleeding.

## 2022-01-25 NOTE — Assessment & Plan Note (Signed)
Well-controlled hypertension. Continue valsartan 160 mg daily and amlodipine 10 mg daily. BP Readings from Last 3 Encounters:  01/25/22 134/86  01/10/22 (!) 142/75  12/06/21 124/72

## 2022-01-25 NOTE — Assessment & Plan Note (Signed)
Advised to stay well-hydrated and avoid NSAIDs.

## 2022-01-25 NOTE — Progress Notes (Signed)
Philip Woodard 71 y.o.   Chief Complaint  Patient presents with   Follow-up    65mth f/u appt, patient states when he eats food with gluten in it, patient states he has rectal bleeding. Patient states when he wipes as well as some blood in the toilet      HISTORY OF PRESENT ILLNESS: This is a 71y.o. male here for 653-monthollow-up of chronic medical problems. Recently also developed intermittent rectal bleeding related to gluten consumption. Last colonoscopy report on 05/26/2021 was remarkable for internal hemorrhoids, 1 polyp, otherwise unremarkable. Works out regularly. Has history of polymyalgia rheumatica. Most recent office visit with rheumatologist, assessment and plan as follows: Assessment / Plan:     Visit Diagnoses: Polymyalgia rheumatica (HCBayard- History of myalgias since February 2023 he had difficulty getting of the bed and also getting out of the chair at the time.  He was diagnosed with PMR by his PCP.  He has been on tapering dose of prednisone which she has been tolerating well.  He is currently on prednisone 4 mg p.o. daily.  He is tapering prednisone by 1 mg every 2 months.  He denies any muscular weakness or tenderness.   Elevated CK - CK was elevated at 381 on November 01, 2021.  He also works out at [employer] times a week and lifts heavy weights.  In August myositis panel HMGCR antibodies were negative.-He denies any muscular weakness.  Plan: CK   Other fatigue-improved.   High risk medication use - Plaquenil 200 mg p.o. twice daily.  Prednisone 4 mg p.o. daily currently. PLQ Eye Exam 09/23/2021 - Plan: CBC with Differential/Platelet, COMPLETE METABOLIC PANEL WITH GFR today and every 5 months.  Information on immunization was placed in the AVS.   Sarcoidosis - Diagnosed 2001, positive lymph node biopsy.  Treated with prednisone for 3 months.  Pulmonary symptoms have been in remission.  He denies any shortness of breath today.   Chronic idiopathic gout involving toe of  left foot without tophus - Related to diet.  His uric acid has been normal.  He is not taking any treatment.   Chondromalacia patellae, right knee - Noted on previous x-rays.  He has done better with the exercises.   DDD (degenerative disc disease), cervical - X-rays from February 2023 showed multilevel spondylosis.  He takes gabapentin for pain relief.   DDD (degenerative disc disease), lumbar - Levoscoliosis and multilevel spondylosis was noted on previous x-rays.  It could mobility in the lumbar spine.   Stage 3a chronic kidney disease (HCC)-grade 1 was elevated.  We will recheck levels today.   Primary hypertension-has been Ro was elevated.  He was advised to monitor blood pressure closely and follow-up with his PCP.   Vitamin D deficiency -his vitamin D was low in the past.  We will recheck vitamin D level today.  Plan: VITAMIN D 25 Hydroxy (Vit-D Deficiency, Fractures)   History of asthma   NASH (nonalcoholic steatohepatitis)-dietary modifications were discussed.   History of hypothyroidism   OSA on CPAP   Multinodular goiter   Orders:    Orders Placed This Encounter  Procedures   CBC with Differential/Platelet   COMPLETE METABOLIC PANEL WITH GFR   CK   VITAMIN D 25 Hydroxy (Vit-D Deficiency, Fractures)    No orders of the defined types were placed in this encounter.       Follow-Up Instructions: Return in about 4 months (around 05/11/2022) for Polymyalgia rheumatica, Sarcoidosis, Osteoarthritis.  Bo Merino, MD  HPI   Prior to Admission medications   Medication Sig Start Date End Date Taking? Authorizing Provider  acetaminophen (TYLENOL) 650 MG CR tablet Take 650 mg by mouth every 8 (eight) hours as needed.   Yes [provider]  acyclovir (ZOVIRAX) 800 MG tablet TAKE 1 TABLET BY MOUTH DAILY. 12/02/21  Yes Horald Pollen, MD  amLODipine (NORVASC) 10 MG tablet TAKE 1 TABLET BY MOUTH EVERY DAY 10/24/21  Yes Offie Waide, Ines Bloomer, MD   cholecalciferol (VITAMIN D3) 25 MCG (1000 UT) tablet Take 1,000 Units by mouth daily.   Yes [provider]  gabapentin (NEURONTIN) 300 MG capsule Take 1 capsule (300 mg total) by mouth 3 (three) times daily. 10/18/21  Yes Rosemarie Ax, MD  Glucosamine-Chondroit-Vit C-Mn (GLUCOSAMINE 1500 COMPLEX) CAPS Take by mouth daily.   Yes [provider]  levothyroxine (SYNTHROID) 200 MCG tablet Take 1 tablet (200 mcg total) by mouth daily. 10/04/21  Yes Nikeshia Keetch, Ines Bloomer, MD  Multiple Vitamin (MULTIVITAMIN) LIQD Take 5 mLs by mouth daily.   Yes [provider]  Omega-3 Fatty Acids (FISH OIL) 1000 MG CAPS Take 2 capsules by mouth daily.   Yes [provider]  predniSONE (DELTASONE) 1 MG tablet Take 4 tablets (4 mg total) by mouth daily with breakfast. 01/02/22  Yes Ofilia Neas, PA-C  tamsulosin (FLOMAX) 0.4 MG CAPS capsule TAKE 1 CAPSULE BY MOUTH EVERY DAY 10/15/21  Yes Laryn Venning, Ines Bloomer, MD  Turmeric 500 MG TABS Take 1 tablet by mouth 2 (two) times daily.   Yes [provider]  valsartan (DIOVAN) 160 MG tablet Take 1 tablet (160 mg total) by mouth daily. 03/31/21  Yes Valjean Ruppel, Ines Bloomer, MD  zinc gluconate 50 MG tablet Take 50 mg by mouth daily.   Yes [provider]    Allergies  Allergen Reactions   Other     SEAFOOD   Shellfish Allergy Swelling    Tongue and facial Swelling Tongue and facial Swelling    Patient Active Problem List   Diagnosis Date Noted   Cervical radiculopathy 10/17/2021   Dyspepsia 07/20/2021   Lower urinary tract symptoms 07/20/2021   Chronic idiopathic gout involving toe of left foot without tophus 04/27/2021   Stage 3a chronic kidney disease ([place]) 03/31/2021   Erectile dysfunction due to arterial insufficiency 03/31/2021   NASH (nonalcoholic steatohepatitis) 02/22/2015   Arthritis of right lower extremity 07/20/2014   Hearing loss 02/19/2014   OSA on CPAP 05/02/2013   Hypothyroid 03/29/2012    Primary hypertension 11/24/2008   HYPOTHYROIDISM, POSTSURGICAL 07/07/2008   HYPERTROPHY PROSTATE W/UR OBST & OTH LUTS 07/07/2008   [place] DISEASE, LUMBAR 12/27/2007   Sarcoidosis 06/06/2007   GOITER, MULTINODULAR 06/06/2007   HEMORRHOIDS 06/06/2007   ASTHMA 06/06/2007    Past Medical History:  Diagnosis Date   Alkaline phosphatase raised 06/28/2011   Fluctuating - suspect due to sarcoidosis   Allergic rhinitis    Allergy    Arthritis    knees   Genital herpes    Hemorrhoids    Hyperglycemia    Hyperplastic lymph node 06/27/2011   Submental node excised 1/02; regrowth: re-excision 10/06   Hypertension    Hypothyroidism    Multinodular goiter    Routine general medical examination at a health care facility    Sarcoidosis    Sleep apnea    wears cpap     Past Surgical History:  Procedure Laterality Date   COLONOSCOPY  5-23/2003  COLONOSCOPY  02/04/2018   TA   COLONOSCOPY WITH PROPOFOL  05/26/2021   [facility]io [facility] at [place] Bilateral [phone]   THYROIDECTOMY  2002    Social History   Socioeconomic History   Marital status: Married    Spouse name: Not on file   Number of children: 2   Years of education: Not on file   Highest education level: Not on file  Occupational History   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  Tobacco Use   Smoking status: Never    Passive exposure: Never   Smokeless tobacco: Never  Vaping Use   Vaping Use: Never used  Substance and Sexual Activity   Alcohol use: Yes    Comment: 3 times per month   Drug use: Never   Sexual activity: Not on file  Other Topics Concern   Not on file  Social History Narrative   Not on file   Social Determinants of Health   Financial Resource Strain: Low Risk  (04/04/2021)   Overall Financial Resource Strain (CARDIA)    Difficulty of Paying Living Expenses: Not hard at all  Food Insecurity: No Food Insecurity (04/04/2021)   Hunger Vital Sign    Worried About  Running Out of Food in the Last Year: Never true    [place] in the Last Year: Never true  Transportation Needs: No Transportation Needs (04/04/2021)   PRAPARE - Hydrologist (Medical): No    Lack of Transportation (Non-Medical): No  Physical Activity: Sufficiently Active (04/04/2021)   Exercise Vital Sign    Days of Exercise per Week: 3 days    Minutes of Exercise per Session: 60 min  Stress: No Stress Concern Present (04/04/2021)   [place]    Feeling of Stress : Not at all  Social Connections: Moderately Integrated (04/04/2021)   Social Connection and Isolation Panel [NHANES]    Frequency of Communication with Friends and Family: Twice a week    Frequency of Social Gatherings with Friends and Family: Twice a week    Attends Religious Services: More than 4 times per year    Active Member of [employer] or Organizations: No    Attends Archivist Meetings: Never    Marital Status: Married  Human resources officer Violence: Not At Risk (04/04/2021)   Humiliation, Afraid, Rape, and Kick questionnaire    Fear of Current or Ex-Partner: No    Emotionally Abused: No    Physically Abused: No    Sexually Abused: No    Family History  Problem Relation Age of Onset   Hypertension Mother    Dementia Father    Sleep apnea Brother    Healthy Brother    Sleep apnea Paternal Aunt    Asthma Cousin    Healthy Daughter    Healthy Daughter    Sarcoidosis Neg Hx    Colon polyps Neg Hx    Colon cancer Neg Hx    Esophageal cancer Neg Hx    Stomach cancer Neg Hx    Rectal cancer Neg Hx      Review of Systems  Constitutional: Negative.  Negative for chills and fever.  HENT: Negative.  Negative for congestion and sore throat.   Respiratory: Negative.  Negative for cough and shortness of breath.   Cardiovascular: Negative.  Negative for chest pain and palpitations.  Gastrointestinal:  Positive  for blood in  stool. Negative for abdominal pain, diarrhea, nausea and vomiting.  Genitourinary: Negative.  Negative for dysuria and hematuria.  Musculoskeletal:  Positive for joint pain.  Skin: Negative.  Negative for rash.  Neurological:  Negative for dizziness and headaches.  All other systems reviewed and are negative.  Today's Vitals   01/25/22 0932  BP: 134/86  Pulse: 68  Temp: 98.1 F (36.7 C)  TempSrc: Oral  SpO2: 97%  Weight: 201 lb 2 oz (91.2 kg)  Height: 5' 7"  (1.702 m)   Body mass index is 31.5 kg/m.   Physical Exam Vitals reviewed.  Constitutional:      Appearance: Normal appearance.  HENT:     Head: Normocephalic.     Mouth/Throat:     Mouth: Mucous membranes are moist.     Pharynx: Oropharynx is clear.  Eyes:     Extraocular Movements: Extraocular movements intact.     Conjunctiva/sclera: Conjunctivae normal.     Pupils: Pupils are equal, round, and reactive to light.  Cardiovascular:     Rate and Rhythm: Normal rate and regular rhythm.     Pulses: Normal pulses.     Heart sounds: Normal heart sounds.  Pulmonary:     Effort: Pulmonary effort is normal.     Breath sounds: Normal breath sounds.  Abdominal:     Palpations: Abdomen is soft.     Tenderness: There is no abdominal tenderness.  Musculoskeletal:     Cervical back: No tenderness.     Right lower leg: No edema.     Left lower leg: No edema.  Lymphadenopathy:     Cervical: No cervical adenopathy.  Skin:    General: Skin is warm and dry.     Capillary Refill: Capillary refill takes less than 2 seconds.  Neurological:     General: No focal deficit present.     Mental Status: He is alert and oriented to person, place, and time.  Psychiatric:        Mood and Affect: Mood normal.        Behavior: Behavior normal.      ASSESSMENT & PLAN: A total of 49 minutes was spent with the patient and counseling/coordination of care regarding preparing for this visit, review of most recent office  visit notes, review of multiple chronic medical problems and their management, review of most recent blood work results, review of all medications, education on nutrition, prognosis, documentation, and need for follow-up.  Problem List Items Addressed This Visit       Cardiovascular and Mediastinum   Primary hypertension    Well-controlled hypertension. Continue valsartan 160 mg daily and amlodipine 10 mg daily. BP Readings from Last 3 Encounters:  01/25/22 134/86  01/10/22 (!) 142/75  12/06/21 124/72        Internal hemorrhoids    Recommend Anusol HC suppositories as needed. Diet and nutrition discussed.        Respiratory   OSA on CPAP    Stable.  On CPAP treatment.        Digestive   NASH (nonalcoholic steatohepatitis)    Stable.  Diet and nutrition discussed.      Rectal bleeding - Primary    Stable.  Most likely secondary to internal hemorrhoids. However gluten sensitivity also contributing. No complications.  Presently not bleeding.      Relevant Orders   Celiac Panel   CBC with Differential/Platelet   Comprehensive metabolic panel     Endocrine   HYPOTHYROIDISM, POSTSURGICAL    Clinically  euthyroid. Lab Results  Component Value Date   TSH 6.82 (H) 09/30/2021  TSH done today.  Continue Synthroid 200 mcg daily.       Relevant Orders   TSH     Genitourinary   Stage 3a chronic kidney disease ([place])    Advised to stay well-hydrated and avoid NSAIDs.        Other   NCGS (non-celiac gluten sensitivity)    Rectal bleeding symptoms precipitated by gluten intake. Celiac panel done today. Advised to follow gluten-free diet.      Relevant Orders   Celiac Panel   CBC with Differential/Platelet   Comprehensive metabolic panel   Other Visit Diagnoses     Need for vaccination       Relevant Orders   Flu Vaccine QUAD High Dose(Fluad) (Completed)   Pneumococcal conjugate vaccine 20-valent (Prevnar 20) (Completed)      Patient Instructions   Rectal Bleeding  Rectal bleeding is when blood comes out of the opening of the butt (anus). People with this kind of bleeding may notice bright red blood in their underwear or in the toilet after they poop (have a bowel movement). They may also have blood mixed with their poop (stool), or dark red or black poop. Rectal bleeding is often a sign that something is wrong. This condition can be caused by many things. It needs to be checked by a doctor. Your doctor will do tests to know what is causing your condition. Follow these instructions at home: Watch for any changes in your condition. Take these actions to help with bleeding and discomfort: Medicines Take over-the-counter and prescription medicines only as told by your doctor. Ask your doctor about changing or stopping your normal medicines. This is important if you are taking blood thinners. Medicines that thin the blood can make rectal bleeding worse. Managing constipation Your condition may cause trouble pooping (constipation). To prevent or treat trouble pooping, or to help make your poop soft, you may need to: Drink enough fluid to keep your pee (urine) pale yellow. Take over-the-counter or prescription medicines. Eat foods that are high in fiber. These include beans, whole grains, and fresh fruits and vegetables. Limit foods that are high in fat and sugar. These include fried or sweet foods.  General instructions Try not to strain when you poop. Try taking a warm bath. This may help with pain. Keep all follow-up visits as told by your doctor. This is important. Contact a doctor if: You have pain or swelling in your belly (abdomen). You have a fever. You feel weak. You feel like you may vomit. You cannot poop. Get help right away if: You have new bleeding. You have more bleeding than before. You have black or dark red poop. You vomit blood or something that looks like coffee grounds. You pass out (faint). You have very bad  pain in your butt. Summary Rectal bleeding is when blood comes out of the opening of the butt. This bleeding is often a sign that something is wrong. Eat a diet that is high in fiber. This will help to keep your poop soft. Talk to your doctor if you take medicines that thin the blood. These medicines can make bleeding worse. Get help right away if you have new or more bleeding, black or dark red poop, or blood in your vomit. Also, get help if you pass out or have very bad pain in your butt. This information is not intended to replace advice given to you by  your health care provider. Make sure you discuss any questions you have with your health care provider. Document Revised: 01/29/2019 Document Reviewed: 01/29/2019 Elsevier Patient Education  [place], MD [facility]

## 2022-01-25 NOTE — Assessment & Plan Note (Signed)
Recommend Anusol HC suppositories as needed. Diet and nutrition discussed.

## 2022-01-25 NOTE — Patient Instructions (Signed)
Rectal Bleeding  Rectal bleeding is when blood comes out of the opening of the butt (anus). People with this kind of bleeding may notice bright red blood in their underwear or in the toilet after they poop (have a bowel movement). They may also have blood mixed with their poop (stool), or dark red or black poop. Rectal bleeding is often a sign that something is wrong. This condition can be caused by many things. It needs to be checked by a doctor. Your doctor will do tests to know what is causing your condition. Follow these instructions at home: Watch for any changes in your condition. Take these actions to help with bleeding and discomfort: Medicines Take over-the-counter and prescription medicines only as told by your doctor. Ask your doctor about changing or stopping your normal medicines. This is important if you are taking blood thinners. Medicines that thin the blood can make rectal bleeding worse. Managing constipation Your condition may cause trouble pooping (constipation). To prevent or treat trouble pooping, or to help make your poop soft, you may need to: Drink enough fluid to keep your pee (urine) pale yellow. Take over-the-counter or prescription medicines. Eat foods that are high in fiber. These include beans, whole grains, and fresh fruits and vegetables. Limit foods that are high in fat and sugar. These include fried or sweet foods.  General instructions Try not to strain when you poop. Try taking a warm bath. This may help with pain. Keep all follow-up visits as told by your doctor. This is important. Contact a doctor if: You have pain or swelling in your belly (abdomen). You have a fever. You feel weak. You feel like you may vomit. You cannot poop. Get help right away if: You have new bleeding. You have more bleeding than before. You have black or dark red poop. You vomit blood or something that looks like coffee grounds. You pass out (faint). You have very bad pain  in your butt. Summary Rectal bleeding is when blood comes out of the opening of the butt. This bleeding is often a sign that something is wrong. Eat a diet that is high in fiber. This will help to keep your poop soft. Talk to your doctor if you take medicines that thin the blood. These medicines can make bleeding worse. Get help right away if you have new or more bleeding, black or dark red poop, or blood in your vomit. Also, get help if you pass out or have very bad pain in your butt. This information is not intended to replace advice given to you by your health care provider. Make sure you discuss any questions you have with your health care provider. Document Revised: 01/29/2019 Document Reviewed: 01/29/2019 Elsevier Patient Education  [place].

## 2022-01-25 NOTE — Assessment & Plan Note (Signed)
Rectal bleeding symptoms precipitated by gluten intake. Celiac panel done today. Advised to follow gluten-free diet.

## 2022-01-25 NOTE — Assessment & Plan Note (Signed)
Stable.  Diet and nutrition discussed.

## 2022-01-25 NOTE — Assessment & Plan Note (Signed)
Clinically euthyroid. Lab Results  Component Value Date   TSH 6.82 (H) 09/30/2021  TSH done today.  Continue Synthroid 200 mcg daily.

## 2022-01-26 LAB — GLIA (IGA/G) + TTG IGA
Antigliadin Abs, IgA: 9 units (ref 0–19)
Gliadin IgG: 3 units (ref 0–19)
Transglutaminase IgA: <2 U/mL (ref 0–3)

## 2022-01-26 NOTE — Telephone Encounter (Signed)
Patient is requesting PSA labs , please advise on order

## 2022-01-31 ENCOUNTER — Encounter: Payer: Medicare HMO | Admitting: Gastroenterology

## 2022-02-03 ENCOUNTER — Other Ambulatory Visit: Payer: Self-pay | Admitting: Physician Assistant

## 2022-02-06 NOTE — Telephone Encounter (Signed)
Per patient he is currently taking Prednisone 3 mg and is tapering to 2 mg daily.

## 2022-02-06 NOTE — Telephone Encounter (Signed)
Next Visit: 05/15/2022  Last Visit: 01/10/2022  Last Fill: 01/02/2022  Dx: Polymyalgia rheumatica   Current Dose per office note on 01/10/2022: prednisone 4 mg p.o. daily.  He is tapering prednisone by 1 mg every 2 months.   Attempted to contact the patient and left message for patient to call and verify what dose of Prednisone he is currently on .   Okay to refill Prednisone?

## 2022-02-08 DIAGNOSIS — G4733 Obstructive sleep apnea (adult) (pediatric): Secondary | ICD-10-CM | POA: Diagnosis not present

## 2022-02-09 ENCOUNTER — Other Ambulatory Visit: Payer: Self-pay

## 2022-02-09 ENCOUNTER — Ambulatory Visit (AMBULATORY_SURGERY_CENTER): Payer: Medicare HMO

## 2022-02-09 VITALS — Ht 67.0 in | Wt 195.0 lb

## 2022-02-09 DIAGNOSIS — R1310 Dysphagia, unspecified: Secondary | ICD-10-CM

## 2022-02-09 NOTE — Progress Notes (Signed)
Denies allergies to eggs or soy products. Denies complication of anesthesia or sedation. Denies use of weight loss medication. Denies use of O2.   Emmi instructions given for endoscopy.

## 2022-02-14 ENCOUNTER — Other Ambulatory Visit: Payer: Self-pay | Admitting: Emergency Medicine

## 2022-02-14 DIAGNOSIS — U071 COVID-19: Secondary | ICD-10-CM

## 2022-02-14 MED ORDER — NIRMATRELVIR/RITONAVIR (PAXLOVID) TABLET (RENAL DOSING): 2.0000 | ORAL_TABLET | Freq: Two times a day (BID) | ORAL | 0 refills | Status: AC

## 2022-02-14 NOTE — Telephone Encounter (Signed)
Recommend to start Paxlovid.  New prescription sent to pharmacy of record today.

## 2022-02-22 ENCOUNTER — Encounter: Payer: Medicare HMO | Admitting: Gastroenterology

## 2022-02-28 ENCOUNTER — Encounter: Payer: Medicare HMO | Admitting: Gastroenterology

## 2022-03-01 ENCOUNTER — Other Ambulatory Visit: Payer: Self-pay | Admitting: Emergency Medicine

## 2022-03-10 DIAGNOSIS — G4733 Obstructive sleep apnea (adult) (pediatric): Secondary | ICD-10-CM | POA: Diagnosis not present

## 2022-03-12 ENCOUNTER — Other Ambulatory Visit: Payer: Self-pay | Admitting: Emergency Medicine

## 2022-03-12 DIAGNOSIS — I10 Essential (primary) hypertension: Secondary | ICD-10-CM

## 2022-03-13 ENCOUNTER — Other Ambulatory Visit: Payer: Self-pay | Admitting: Physician Assistant

## 2022-03-15 NOTE — Progress Notes (Unsigned)
PATIENT: Philip Woodard DOB: Dec 06, 1950  REASON FOR VISIT: follow up HISTORY FROM: patient PRIMARY NEUROLOGIST: Dr. Frances Furbish  Chief Complaint  Patient presents with   Follow-up    Pt in 18  Pt here for CPAP f/u Pt states no questions or concerns for today's visit      HISTORY OF PRESENT ILLNESS: Today 03/16/22:  Philip Woodard is a 72 y.o. male with a history of OSA on CPAP. Returns today for follow-up.  Reports that the CPAP is working well for him.  States that he continues to notice the benefit.  He does feel the mask leaking. Wihtin the last year, he did have mask refitting. He feels that the straps should be tighter.  Changes out his supplies regularly.  His download is below       03/16/21 Mr. Philip Woodard is a 72 year old male with a history of obstructive sleep apnea on CPAP.  He returns today for follow-up.  He reports that the CPAP continues to work well for him.  He reports that he does notice the mask leaking at night.  He does change out his supplies regularly.  He returns today for an evaluation.    HISTORY (copied from Dr. Teofilo Pod note) Mr. Philip Woodard is a 72 year old right-handed gentleman with an underlying medical history of sarcoidosis, hypothyroidism, arthritis, allergic rhinitis, and borderline obesity, who was previously diagnosed with obstructive sleep apnea and placed on CPAP therapy.  Prior sleep study results are not available for my review today.  I reviewed your office note from 01/15/2020.  His Epworth sleepiness score is 10 of 24, fatigue severity score is 11 out of 63. He has benefited from treatment.  He is fully compliant with his AutoPap.  He reports that he had a sleep study originally at [facility] in 2000.  He had a home sleep test some for 5 years ago he recalls.  His current machine works well.  He does use a full facemask, he brought his machine and his mask, he uses a large F 20 fullface mask.  He does notice the leak at times.  He also has mouth  dryness.  He generally goes to bed between 11 and midnight and rise time is between 530 and 6.  He denies any recurrent morning headaches or nighttime nocturia.  He is married and lives with his wife.  He is semiretired.  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  They have 2 grown daughters, he has one 23-year-old granddaughter as well.  His older daughter lives in [place], younger daughter in [place].  Younger daughter has sleep apnea, he has a brother with sleep apnea as well.  He is a non-smoker and drinks alcohol rarely, maybe once a month and no day-to-day caffeine, occasional coffee during the week. I was able to review his CPAP compliance data from 02/15/2020 through 03/15/2020, which is a total of 30 days, during which time he used his machine every night, with percent use days greater than 4 hours at 97%, indicating excellent compliance with an average usage of 6 hours and 6 minutes, residual AHI at goal at 3/h, 95th percentile of pressure at 13.6 cm with a range of 4 to 16 cm with EPR.  He is on AutoPap, he has an air sense 10 auto machine from [employer], set up date according to online records was 10/28/2015.  His air leakage is on the high side fairly consistently with the 95th percentile  at 50.8 L/min.  His DME company is [employer].  REVIEW OF SYSTEMS: Out of a complete 14 system review of symptoms, the patient complains only of the following symptoms, and all other reviewed systems are negative.    ALLERGIES: Allergies  Allergen Reactions   Other     SEAFOOD   Shellfish Allergy Swelling    Tongue and facial Swelling Tongue and facial Swelling    HOME MEDICATIONS: Outpatient Medications Prior to Visit  Medication Sig Dispense Refill   acetaminophen (TYLENOL) 650 MG CR tablet Take 650 mg by mouth every 8 (eight) hours as needed.     acyclovir (ZOVIRAX) 800 MG tablet TAKE 1 TABLET BY MOUTH EVERY DAY 90 tablet 0   amLODipine (NORVASC) 10 MG tablet  TAKE 1 TABLET BY MOUTH EVERY DAY 90 tablet 1   cholecalciferol (VITAMIN D3) 25 MCG (1000 UT) tablet Take 1,000 Units by mouth daily.     gabapentin (NEURONTIN) 300 MG capsule Take 1 capsule (300 mg total) by mouth 3 (three) times daily. 90 capsule 1   Glucosamine-Chondroit-Vit C-Mn (GLUCOSAMINE 1500 COMPLEX) CAPS Take by mouth daily.     levothyroxine (SYNTHROID) 200 MCG tablet Take 1 tablet (200 mcg total) by mouth daily. 90 tablet 3   Multiple Vitamin (MULTIVITAMIN) LIQD Take 5 mLs by mouth daily.     Omega-3 Fatty Acids (FISH OIL) 1000 MG CAPS Take 2 capsules by mouth daily.     predniSONE (DELTASONE) 1 MG tablet Take 2 tablets (2 mg total) by mouth daily with breakfast. 60 tablet 0   tamsulosin (FLOMAX) 0.4 MG CAPS capsule TAKE 1 CAPSULE BY MOUTH EVERY DAY 90 capsule 1   Turmeric 500 MG TABS Take 1 tablet by mouth 2 (two) times daily.     valsartan (DIOVAN) 160 MG tablet TAKE 1 TABLET BY MOUTH EVERY DAY 90 tablet 3   zinc gluconate 50 MG tablet Take 50 mg by mouth daily.     No facility-administered medications prior to visit.    PAST MEDICAL HISTORY: Past Medical History:  Diagnosis Date   Alkaline phosphatase raised 06/28/2011   Fluctuating - suspect due to sarcoidosis   Allergic rhinitis    Allergy    Arthritis    knees   Genital herpes    GERD (gastroesophageal reflux disease)    Hemorrhoids    Hyperglycemia    Hyperplastic lymph node 06/27/2011   Submental node excised 1/02; regrowth: re-excision 10/06   Hypertension    Hypothyroidism    Multinodular goiter    Rheumatoid arteritis (HCC)    Routine general medical examination at a health care facility    Sarcoidosis    Sleep apnea    wears cpap     PAST SURGICAL HISTORY: Past Surgical History:  Procedure Laterality Date   COLONOSCOPY  5-23/2003   COLONOSCOPY  02/04/2018   [facility]   COLONOSCOPY WITH PROPOFOL  05/26/2021   [place] [facility] at [facility]   POLYPECTOMY     ROTATOR CUFF REPAIR Bilateral [id number]    THYROIDECTOMY  2002    FAMILY HISTORY: Family History  Problem Relation Age of Onset   Hypertension Mother    Dementia Father    Sleep apnea Brother    Healthy Brother    Sleep apnea Paternal Aunt    Asthma Cousin    Healthy Daughter    Healthy Daughter    Sarcoidosis Neg Hx    Colon polyps Neg Hx    Colon cancer Neg Hx    Esophageal  cancer Neg Hx    Stomach cancer Neg Hx    Rectal cancer Neg Hx     SOCIAL HISTORY: Social History   Socioeconomic History   Marital status: Married    Spouse name: Not on file   Number of children: 2   Years of education: Not on file   Highest education level: Not on file  Occupational History   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  Tobacco Use   Smoking status: Never    Passive exposure: Never   Smokeless tobacco: Never  Vaping Use   Vaping Use: Never used  Substance and Sexual Activity   Alcohol use: Yes    Comment: 3 times per month   Drug use: Never   Sexual activity: Not on file  Other Topics Concern   Not on file  Social History Narrative   Not on file   Social Determinants of Health   Financial Resource Strain: Low Risk  (04/04/2021)   Overall Financial Resource Strain (CARDIA)    Difficulty of Paying Living Expenses: Not hard at all  Food Insecurity: No Food Insecurity (04/04/2021)   Hunger Vital Sign    Worried About Running Out of Food in the Last Year: Never true    Ran Out of Food in the Last Year: Never true  Transportation Needs: No Transportation Needs (04/04/2021)   PRAPARE - Administrator, Civil Service (Medical): No    Lack of Transportation (Non-Medical): No  Physical Activity: Sufficiently Active (04/04/2021)   Exercise Vital Sign    Days of Exercise per Week: 3 days    Minutes of Exercise per Session: 60 min  Stress: No Stress Concern Present (04/04/2021)   [employer] of Occupational Health - Occupational Stress Questionnaire    Feeling of Stress : Not at all  Social Connections:  Moderately Integrated (04/04/2021)   Social Connection and Isolation Panel [NHANES]    Frequency of Communication with Friends and Family: Twice a week    Frequency of Social Gatherings with Friends and Family: Twice a week    Attends Religious Services: More than 4 times per year    Active Member of [employer] or Organizations: No    Attends Banker Meetings: Never    Marital Status: Married  Catering manager Violence: Not At Risk (04/04/2021)   Humiliation, Afraid, Rape, and Kick questionnaire    Fear of Current or Ex-Partner: No    Emotionally Abused: No    Physically Abused: No    Sexually Abused: No      PHYSICAL EXAM  Vitals:   03/16/22 0819  BP: (!) 141/69  Pulse: 83  Weight: 207 lb 12.8 oz (94.3 kg)  Height: 5\' 7"  (1.702 m)   Body mass index is 32.55 kg/m.  Generalized: Well developed, in no acute distress  Chest: Lungs clear to auscultation bilaterally  Neurological examination  Mentation: Alert oriented to time, place, history taking. Follows all commands speech and language fluent Cranial nerve II-XII: Extraocular movements were full, visual field were full on confrontational test Head turning and shoulder shrug  were normal and symmetric. Motor: The motor testing reveals 5 over 5 strength of all 4 extremities. Good symmetric motor tone is noted throughout.  Sensory: Sensory testing is intact to soft touch on all 4 extremities. No evidence of extinction is noted.  Gait and station: Gait is normal.    DIAGNOSTIC DATA (LABS, IMAGING, TESTING) - I reviewed patient records, labs, notes, testing and imaging myself where available.  Lab Results  Component Value Date   WBC 6.6 01/25/2022   HGB 13.7 01/25/2022   HCT 41.3 01/25/2022   MCV 91.3 01/25/2022   PLT 259.0 01/25/2022      Component Value Date/Time   NA 141 01/25/2022 1006   K 3.9 01/25/2022 1006   CL 107 01/25/2022 1006   CO2 31 01/25/2022 1006   GLUCOSE 84 01/25/2022 1006   BUN 15  01/25/2022 1006   CREATININE 1.29 01/25/2022 1006   CREATININE 1.80 (H) 01/10/2022 0920   CALCIUM 9.1 01/25/2022 1006   PROT 6.6 01/25/2022 1006   ALBUMIN 4.0 01/25/2022 1006   AST 163 (H) 01/25/2022 1006   ALT 51 01/25/2022 1006   ALKPHOS 61 01/25/2022 1006   BILITOT 1.0 01/25/2022 1006   GFRNONAA 86.70 02/15/2010 0756   GFRAA  02/20/2009 0336    >60        The eGFR has been calculated using the MDRD equation. This calculation has not been validated in all clinical situations. eGFR's persistently <60 mL/min signify possible Chronic Kidney Disease.   Lab Results  Component Value Date   CHOL 139 02/04/2020   HDL 49.40 02/04/2020   LDLCALC 75 02/04/2020   TRIG 71.0 02/04/2020   CHOLHDL 3 02/04/2020   Lab Results  Component Value Date   HGBA1C 5.7 11/14/2017   No results found for: "VITAMINB12" Lab Results  Component Value Date   TSH 6.82 (H) 09/30/2021      ASSESSMENT AND PLAN 72 y.o. year old male  has a past medical history of Alkaline phosphatase raised (06/28/2011), Allergic rhinitis, Allergy, Arthritis, Genital herpes, GERD (gastroesophageal reflux disease), Hemorrhoids, Hyperglycemia, Hyperplastic lymph node (06/27/2011), Hypertension, Hypothyroidism, Multinodular goiter, Rheumatoid arteritis (HCC), Routine general medical examination at a health care facility, Sarcoidosis, and Sleep apnea. here with:  OSA on CPAP  - CPAP compliance excellent - Good treatment of AHI  - Encourage patient to use CPAP nightly and > 4 hours each night - F/U in 1 year or sooner if needed   Butch Penny, MSN, NP-C 03/16/2022, 8:37 AM [facility] [address], [address] [place], [place] [address] [phone]

## 2022-03-16 ENCOUNTER — Encounter: Payer: Self-pay | Admitting: Adult Health

## 2022-03-16 ENCOUNTER — Ambulatory Visit: Payer: Medicare HMO | Admitting: Adult Health

## 2022-03-16 VITALS — BP 141/69 | HR 83 | Ht 67.0 in | Wt 207.8 lb

## 2022-03-16 DIAGNOSIS — G4733 Obstructive sleep apnea (adult) (pediatric): Secondary | ICD-10-CM

## 2022-03-16 NOTE — Patient Instructions (Signed)
Continue using CPAP nightly and greater than 4 hours each night °If your symptoms worsen or you develop new symptoms please let us know.  ° °

## 2022-03-17 ENCOUNTER — Encounter: Payer: Self-pay | Admitting: Gastroenterology

## 2022-03-20 ENCOUNTER — Telehealth: Payer: Self-pay | Admitting: Gastroenterology

## 2022-03-20 NOTE — Telephone Encounter (Signed)
PT rescheduled procedure from 03/21/2022 to 1/18

## 2022-03-21 ENCOUNTER — Encounter: Payer: Medicare HMO | Admitting: Gastroenterology

## 2022-03-30 ENCOUNTER — Encounter: Payer: Self-pay | Admitting: Gastroenterology

## 2022-03-30 ENCOUNTER — Ambulatory Visit (AMBULATORY_SURGERY_CENTER): Payer: Medicare HMO | Admitting: Gastroenterology

## 2022-03-30 VITALS — BP 130/77 | HR 67 | Temp 98.0°F | Resp 10 | Ht 67.0 in | Wt 195.0 lb

## 2022-03-30 DIAGNOSIS — K219 Gastro-esophageal reflux disease without esophagitis: Secondary | ICD-10-CM

## 2022-03-30 DIAGNOSIS — K2100 Gastro-esophageal reflux disease with esophagitis, without bleeding: Secondary | ICD-10-CM

## 2022-03-30 DIAGNOSIS — I10 Essential (primary) hypertension: Secondary | ICD-10-CM | POA: Diagnosis not present

## 2022-03-30 DIAGNOSIS — R1310 Dysphagia, unspecified: Secondary | ICD-10-CM | POA: Diagnosis not present

## 2022-03-30 DIAGNOSIS — G4733 Obstructive sleep apnea (adult) (pediatric): Secondary | ICD-10-CM | POA: Diagnosis not present

## 2022-03-30 MED ORDER — PANTOPRAZOLE SODIUM 40 MG PO TBEC: 40.0000 mg | DELAYED_RELEASE_TABLET | Freq: Every day | ORAL | 4 refills | Status: DC

## 2022-03-30 MED ORDER — SODIUM CHLORIDE 0.9 % IV SOLN: 500.0000 mL | Freq: Once | INTRAVENOUS | Status: DC

## 2022-03-30 NOTE — Progress Notes (Signed)
See 03/16/2022 H&P, no changes

## 2022-03-30 NOTE — Op Note (Signed)
[facility] Patient Name: Zakkary Thibault Procedure Date: 03/30/2022 10:08 AM MRN: [id number] Endoscopist: Ladene Artist , MD, [id number] Age: 72 Referring MD:  Date of Birth: 11/04/1950 Gender: Male Account #: [id number] Procedure:                Upper GI endoscopy Indications:              Dysphagia, Gastroesophageal reflux disease Medicines:                Monitored Anesthesia Care Procedure:                Pre-Anesthesia Assessment:                           - Prior to the procedure, a History and Physical                            was performed, and patient medications and                            allergies were reviewed. The patient's tolerance of                            previous anesthesia was also reviewed. The risks                            and benefits of the procedure and the sedation                            options and risks were discussed with the patient.                            All questions were answered, and informed consent                            was obtained. Prior Anticoagulants: The patient has                            taken no anticoagulant or antiplatelet agents. ASA                            Grade Assessment: II - A patient with mild systemic                            disease. After reviewing the risks and benefits,                            the patient was deemed in satisfactory condition to                            undergo the procedure.                           After obtaining informed consent, the endoscope was  passed under direct vision. Throughout the                            procedure, the patient's blood pressure, pulse, and                            oxygen saturations were monitored continuously. The                            GIF [id number] #[id number] was introduced through the                            mouth, and advanced to the second part of duodenum.                            The upper  GI endoscopy was accomplished without                            difficulty. The patient tolerated the procedure                            well. Scope In: Scope Out: Findings:                 LA Grade A (one or more mucosal breaks less than 5                            mm, not extending between tops of 2 mucosal folds)                            esophagitis with no bleeding was found at the                            gastroesophageal junction.                           No endoscopic abnormality was evident in the                            esophagus to explain the patient's complaint of                            dysphagia. It was decided, however, to proceed with                            dilation of the entire esophagus. A guidewire was                            placed and the scope was withdrawn. Dilation was                            performed with a Savary dilator with no resistance  at 17 mm.                           A small hiatal hernia was present.                           The exam of the stomach was otherwise normal.                           The duodenal bulb and second portion of the                            duodenum were normal. Complications:            No immediate complications. Estimated Blood Loss:     Estimated blood loss: none. Impression:               - LA Grade A reflux esophagitis with no bleeding.                           - No endoscopic esophageal abnormality to explain                            patient's dysphagia. Esophagus dilated.                           - Small hiatal hernia.                           - Normal duodenal bulb and second portion of the                            duodenum.                           - No specimens collected. Recommendation:           - Patient has a contact number available for                            emergencies. The signs and symptoms of potential                            delayed  complications were discussed with the                            patient. Return to normal activities tomorrow.                            Written discharge instructions were provided to the                            patient.                           - Clear liquid diet for 2 hours, then advance as  tolerated to soft diet today.                           - Resume prior diet tomorrow.                           - Follow antireflux measures.                           - Continue present medications.                           - Protonix (pantoprazole) 40 mg PO daily                            indefinitely, 1 year of refills.                           - GI office appt in 6 weeks. Ladene Artist, MD 03/30/2022 10:30:47 AM This report has been signed electronically.

## 2022-03-30 NOTE — Patient Instructions (Addendum)
Follow a Clear Liquid diet for x2 hours until 1:00 and then soft foods for the rest of the day. Resume previous medications. Take Protonix 40 MG by mouth daily indefinitely. Follow up in Office in 6 weeks at scheduled appointment.  Handout provided on post dilation diet.  YOU HAD AN ENDOSCOPIC PROCEDURE TODAY AT [facility]:   Refer to the procedure report that was given to you for any specific questions about what was found during the examination.  If the procedure report does not answer your questions, please call your gastroenterologist to clarify.  If you requested that your care partner not be given the details of your procedure findings, then the procedure report has been included in a sealed envelope for you to review at your convenience later.  YOU SHOULD EXPECT: Some feelings of bloating in the abdomen. Passage of more gas than usual.  Walking can help get rid of the air that was put into your GI tract during the procedure and reduce the bloating. If you had a lower endoscopy (such as a colonoscopy or flexible sigmoidoscopy) you may notice spotting of blood in your stool or on the toilet paper. If you underwent a bowel prep for your procedure, you may not have a normal bowel movement for a few days.  Please Note:  You might notice some irritation and congestion in your nose or some drainage.  This is from the oxygen used during your procedure.  There is no need for concern and it should clear up in a day or so.  SYMPTOMS TO REPORT IMMEDIATELY:  Following upper endoscopy (EGD)  Vomiting of blood or coffee ground material  New chest pain or pain under the shoulder blades  Painful or persistently difficult swallowing  New shortness of breath  Fever of 100F or higher  Black, tarry-looking stools  For urgent or emergent issues, a gastroenterologist can be reached at any hour by calling [phone]. Do not use MyChart messaging for urgent concerns.    DIET:  We do  recommend a small meal at first, but then you may proceed to your regular diet.  Drink plenty of fluids but you should avoid alcoholic beverages for 24 hours.  ACTIVITY:  You should plan to take it easy for the rest of today and you should NOT DRIVE or use heavy machinery until tomorrow (because of the sedation medicines used during the test).    FOLLOW UP: Our staff will call the number listed on your records the next business day following your procedure.  We will call around 7:15- 8:00 am to check on you and address any questions or concerns that you may have regarding the information given to you following your procedure. If we do not reach you, we will leave a message.     If any biopsies were taken you will be contacted by phone or by letter within the next 1-3 weeks.  Please call us at [phone] if you have not heard about the biopsies in 3 weeks.    SIGNATURES/CONFIDENTIALITY: You and/or your care partner have signed paperwork which will be entered into your electronic medical record.  These signatures attest to the fact that that the information above on your After Visit Summary has been reviewed and is understood.  Full responsibility of the confidentiality of this discharge information lies with you and/or your care-partner.

## 2022-03-30 NOTE — Progress Notes (Signed)
Called to room to assist during endoscopic procedure.  Patient ID and intended procedure confirmed with present staff. Received instructions for my participation in the procedure from the performing physician.

## 2022-03-30 NOTE — Progress Notes (Signed)
Vials-dt  Pt's states no medical or surgical changes since previsit or office visit.

## 2022-03-30 NOTE — Progress Notes (Signed)
Report to PACU, RN, vss, BBS= Clear.  

## 2022-03-31 ENCOUNTER — Telehealth: Payer: Self-pay | Admitting: *Deleted

## 2022-03-31 NOTE — Telephone Encounter (Signed)
Attempted to call patient for their post-procedure follow-up call. No answer. Left voicemail.   

## 2022-04-05 DIAGNOSIS — D869 Sarcoidosis, unspecified: Secondary | ICD-10-CM | POA: Diagnosis not present

## 2022-04-05 DIAGNOSIS — N1831 Chronic kidney disease, stage 3a: Secondary | ICD-10-CM | POA: Diagnosis not present

## 2022-04-05 DIAGNOSIS — I129 Hypertensive chronic kidney disease with stage 1 through stage 4 chronic kidney disease, or unspecified chronic kidney disease: Secondary | ICD-10-CM | POA: Diagnosis not present

## 2022-04-06 ENCOUNTER — Ambulatory Visit: Payer: Medicare HMO | Admitting: Emergency Medicine

## 2022-04-07 ENCOUNTER — Telehealth: Payer: Self-pay

## 2022-04-07 NOTE — Telephone Encounter (Signed)
Left message for patient to call back to schedule Medicare Annual Wellness Visit   Last AWV  04/04/21  Please schedule at anytime with [facility] if patient calls the office back.    30 Minutes appointment   Any questions, please call me at [phone]

## 2022-04-10 DIAGNOSIS — G4733 Obstructive sleep apnea (adult) (pediatric): Secondary | ICD-10-CM | POA: Diagnosis not present

## 2022-04-11 ENCOUNTER — Other Ambulatory Visit: Payer: Self-pay | Admitting: Emergency Medicine

## 2022-04-11 DIAGNOSIS — R399 Unspecified symptoms and signs involving the genitourinary system: Secondary | ICD-10-CM

## 2022-04-24 ENCOUNTER — Ambulatory Visit: Payer: Medicare HMO

## 2022-04-25 ENCOUNTER — Ambulatory Visit (INDEPENDENT_AMBULATORY_CARE_PROVIDER_SITE_OTHER): Payer: Medicare HMO

## 2022-04-25 VITALS — Ht 67.0 in | Wt 196.0 lb

## 2022-04-25 DIAGNOSIS — Z0000 Encounter for general adult medical examination without abnormal findings: Secondary | ICD-10-CM

## 2022-04-25 NOTE — Progress Notes (Signed)
I connected with  Philip Woodard on 04/25/22 by a audio enabled telemedicine application and verified that I am speaking with the correct person using two identifiers.  Patient Location: Home  Provider Location: Home Office  I discussed the limitations of evaluation and management by telemedicine. The patient expressed understanding and agreed to proceed.  Subjective:   Philip Woodard is a 72 y.o. male who presents for Medicare Annual/Subsequent preventive examination.  Review of Systems         Objective:    Today's Vitals   04/25/22 1405  Weight: 196 lb (88.9 kg)  Height: 5' 7"$  (1.702 m)   Body mass index is 30.7 kg/m.     04/25/2022    2:14 PM 11/25/2021    9:15 AM 04/04/2021    8:14 AM 01/16/2020    8:37 AM 12/12/2018    9:30 AM 12/24/2017    1:08 PM 03/04/2015   10:57 AM  Advanced Directives  Does Patient Have a Medical Advance Directive? No No No Yes No No No  Type of Scientist, research (medical);Living will     Does patient want to make changes to medical advance directive?    No - Patient declined     Copy of [place] in Chart?    No - copy requested     Would patient like information on creating a medical advance directive? Yes (ED - Information included in AVS) No - Patient declined No - Patient declined        Current Medications (verified) Outpatient Encounter Medications as of 04/25/2022  Medication Sig   acetaminophen (TYLENOL) 650 MG CR tablet Take 650 mg by mouth every 8 (eight) hours as needed.   acyclovir (ZOVIRAX) 800 MG tablet TAKE 1 TABLET BY MOUTH EVERY DAY   amLODipine (NORVASC) 10 MG tablet TAKE 1 TABLET BY MOUTH EVERY DAY   cholecalciferol (VITAMIN D3) 25 MCG (1000 UT) tablet Take 1,000 Units by mouth daily.   gabapentin (NEURONTIN) 300 MG capsule Take 1 capsule (300 mg total) by mouth 3 (three) times daily.   Glucosamine-Chondroit-Vit C-Mn (GLUCOSAMINE 1500 COMPLEX) CAPS Take by mouth daily.    levothyroxine (SYNTHROID) 200 MCG tablet Take 1 tablet (200 mcg total) by mouth daily.   Multiple Vitamin (MULTIVITAMIN) LIQD Take 5 mLs by mouth daily.   Omega-3 Fatty Acids (FISH OIL) 1000 MG CAPS Take 2 capsules by mouth daily.   pantoprazole (PROTONIX) 40 MG tablet Take 1 tablet (40 mg total) by mouth daily.   predniSONE (DELTASONE) 1 MG tablet Take 2 tablets (2 mg total) by mouth daily with breakfast.   tamsulosin (FLOMAX) 0.4 MG CAPS capsule TAKE 1 CAPSULE BY MOUTH EVERY DAY   Turmeric 500 MG TABS Take 1 tablet by mouth 2 (two) times daily.   valsartan (DIOVAN) 160 MG tablet TAKE 1 TABLET BY MOUTH EVERY DAY   zinc gluconate 50 MG tablet Take 50 mg by mouth daily.   No facility-administered encounter medications on file as of 04/25/2022.    Allergies (verified) Other and Shellfish allergy   History: Past Medical History:  Diagnosis Date   Alkaline phosphatase raised 06/28/2011   Fluctuating - suspect due to sarcoidosis   Allergic rhinitis    Allergy    Arthritis    knees   Genital herpes    GERD (gastroesophageal reflux disease)    Hemorrhoids    Hyperglycemia    Hyperplastic lymph node 06/27/2011   Submental node  excised 1/02; regrowth: re-excision 10/06   Hypertension    Hypothyroidism    Multinodular goiter    Rheumatoid arteritis ([place])    Routine general medical examination at a health care facility    Sarcoidosis    Sleep apnea    wears cpap    Past Surgical History:  Procedure Laterality Date   COLONOSCOPY  5-23/2003   COLONOSCOPY  02/04/2018   TA   COLONOSCOPY WITH PROPOFOL  05/26/2021   [facility]io [facility] at [place] Bilateral [id number]   THYROIDECTOMY  2002   Family History  Problem Relation Age of Onset   Hypertension Mother    Dementia Father    Sleep apnea Brother    Healthy Brother    Sleep apnea Paternal Aunt    Asthma Cousin    Healthy Daughter    Healthy Daughter    Sarcoidosis Neg Hx    Colon polyps Neg Hx     Colon cancer Neg Hx    Esophageal cancer Neg Hx    Stomach cancer Neg Hx    Rectal cancer Neg Hx    Social History   Socioeconomic History   Marital status: Married    Spouse name: Not on file   Number of children: 2   Years of education: Not on file   Highest education level: Not on file  Occupational History   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  Tobacco Use   Smoking status: Never    Passive exposure: Never   Smokeless tobacco: Never  Vaping Use   Vaping Use: Never used  Substance and Sexual Activity   Alcohol use: Yes    Comment: 3 times per month   Drug use: Never   Sexual activity: Not on file  Other Topics Concern   Not on file  Social History Narrative   Not on file   Social Determinants of Health   Financial Resource Strain: Low Risk  (04/25/2022)   Overall Financial Resource Strain (CARDIA)    Difficulty of Paying Living Expenses: Not hard at all  Food Insecurity: No Food Insecurity (04/04/2021)   Hunger Vital Sign    Worried About Running Out of Food in the Last Year: Never true    Ran Out of Food in the Last Year: Never true  Transportation Needs: No Transportation Needs (04/25/2022)   PRAPARE - Hydrologist (Medical): No    Lack of Transportation (Non-Medical): No  Physical Activity: Sufficiently Active (04/25/2022)   Exercise Vital Sign    Days of Exercise per Week: 5 days    Minutes of Exercise per Session: 60 min  Stress: No Stress Concern Present (04/25/2022)   [place]    Feeling of Stress : Not at all  Social Connections: Moderately Integrated (04/25/2022)   Social Connection and Isolation Panel [NHANES]    Frequency of Communication with Friends and Family: Twice a week    Frequency of Social Gatherings with Friends and Family: Once a week    Attends Religious Services: More than 4 times per year    Active Member of [employer] or Organizations: No     Attends Archivist Meetings: Never    Marital Status: Married    Tobacco Counseling Counseling given: Not Answered   Clinical Intake:  Pre-visit preparation completed: Yes  Pain : No/denies pain     BMI - recorded: 30.7 Nutritional Status: BMI >  30  Obese Nutritional Risks: None Diabetes: No  How often do you need to have someone help you when you read instructions, pamphlets, or other written materials from your doctor or pharmacy?: 1 - Never  Diabetic?no  Interpreter Needed?: No  Information entered by :: B.Trelon Plush,LPN   Activities of Daily Living    04/25/2022    2:17 PM  In your present state of health, do you have any difficulty performing the following activities:  Hearing? 0  Vision? 0  Difficulty concentrating or making decisions? 0  Walking or climbing stairs? 0  Dressing or bathing? 0  Doing errands, shopping? 0  Preparing Food and eating ? N  Using the Toilet? N  In the past six months, have you accidently leaked urine? N  Do you have problems with loss of bowel control? N  Managing your Medications? N  Managing your Finances? N  Housekeeping or managing your Housekeeping? N    Patient Care Team: Horald Pollen, MD as PCP - General (Internal Medicine) Tanda Rockers, MD (Pulmonary Disease) Kimbrough, Achille Rich., MD as Attending Physician (Urology)  Indicate any recent Medical Services you may have received from other than [facility] providers in the past year (date may be approximate).     Assessment:   This is a routine wellness examination for [place].  Hearing/Vision screen Hearing Screening - Comments:: Adequate hearing. Hearing specialist 3 yrs ago said some hearing loss..will follow up Vision Screening - Comments:: Adequate vision glasses. [place] opthal  Dietary issues and exercise activities discussed: Current Exercise Habits: Structured exercise class   Goals Addressed   None    Depression Screen     04/25/2022    2:11 PM 01/25/2022    9:35 AM 10/04/2021   10:19 AM 07/20/2021    8:44 AM 07/20/2021    8:32 AM 04/04/2021    8:15 AM 04/04/2021    8:12 AM  PHQ 2/9 Scores  PHQ - 2 Score 0 0 0 0 0 0 0    Fall Risk    04/25/2022    2:08 PM 01/25/2022    9:35 AM 10/04/2021   10:19 AM 07/20/2021    8:44 AM 07/20/2021    8:32 AM  Fall Risk   Falls in the past year? 0 0 0 0 0  Number falls in past yr: 0 0     Injury with Fall? 0 0  0   Risk for fall due to : No Fall Risks No Fall Risks     Follow up  Falls evaluation completed       FALL RISK PREVENTION PERTAINING TO THE HOME:  Any stairs in or around the home? Yes  If so, are there any without handrails? Yes  Home free of loose throw rugs in walkways, pet beds, electrical cords, etc? Yes  Adequate lighting in your home to reduce risk of falls? Yes   ASSISTIVE DEVICES UTILIZED TO PREVENT FALLS:  Life alert? No  Use of a cane, walker or w/c? No  Grab bars in the bathroom? No  Shower chair or bench in shower? No  Elevated toilet seat or a handicapped toilet? No  Cognitive Function:        04/25/2022    2:21 PM  6CIT Screen  What Year? 0 points  What month? 0 points  What time? 0 points  Count back from 20 0 points  Months in reverse 0 points  Repeat phrase 0 points  Total Score 0 points  Immunizations Immunization History  Administered Date(s) Administered   Fluad Quad(high Dose 65+) 12/11/2018, 01/20/2020, 03/31/2021, 01/25/2022   Influenza Whole 12/11/2009, 12/11/2011   Influenza, High Dose Seasonal PF 11/14/2017   Influenza,inj,Quad PF,6+ Mos 01/31/2013, 12/10/2013, 02/22/2016, 12/19/2016   Moderna Sars-Covid-2 Vaccination 03/18/2020   PFIZER(Purple Top)SARS-COV-2 Vaccination 04/04/2019, 04/25/2019   PNEUMOCOCCAL CONJUGATE-20 01/25/2022   Pneumococcal Conjugate-13 11/14/2017   Pneumococcal Polysaccharide-23 01/31/2013   Td 10/23/2005   Tdap 02/22/2016   Zoster, Live 01/31/2013    TDAP status: Up to  date  Flu Vaccine status: Up to date  Pneumococcal vaccine status: Up to date  Covid-19 vaccine status: Completed vaccines  Qualifies for Shingles Vaccine? Yes   Zostavax completed Yes   Shingrix Completed?: Yes  Screening Tests Health Maintenance  Topic Date Due   Zoster Vaccines- Shingrix (1 of 2) Never done   Medicare Annual Wellness (AWV)  04/26/2023   DTaP/Tdap/Td (3 - Td or Tdap) 02/21/2026   COLONOSCOPY (Pts 45-71yr Insurance coverage will need to be confirmed)  05/26/2028   Pneumonia Vaccine 70 Years old  Completed   INFLUENZA VACCINE  Completed   Hepatitis C Screening  Completed   HPV VACCINES  Aged Out   COVID-19 Vaccine  Discontinued    Health Maintenance  Health Maintenance Due  Topic Date Due   Zoster Vaccines- Shingrix (1 of 2) Never done    Colorectal cancer screening: Type of screening: Colonoscopy. Completed yes. Repeat every 5 years  Lung Cancer Screening: (Low Dose CT Chest recommended if Age 70-80years, 30 pack-year currently smoking OR have quit w/in 15years.) does not qualify.   Lung Cancer Screening Referral: no  Additional Screening:  Hepatitis C Screening: does qualify; Completed yes 2023  Vision Screening: Recommended annual ophthalmology exams for early detection of glaucoma and other disorders of the eye. Is the patient up to date with their annual eye exam?  Yes  Who is the provider or what is the name of the office in which the patient attends annual eye exams? [facility] is not established with a provider, would they like to be referred to a provider to establish care? No .   Dental Screening: Recommended annual dental exams for proper oral hygiene  Community Resource Referral / Chronic Care Management: CRR required this visit?  No   CCM required this visit?  No      Plan:     I have personally reviewed and noted the following in the patient's chart:   Medical and social history Use of alcohol, tobacco or  illicit drugs  Current medications and supplements including opioid prescriptions. Patient is not currently taking opioid prescriptions. Functional ability and status Nutritional status Physical activity Advanced directives List of other physicians Hospitalizations, surgeries, and ER visits in previous 12 months Vitals Screenings to include cognitive, depression, and falls Referrals and appointments  In addition, I have reviewed and discussed with patient certain preventive protocols, quality metrics, and best practice recommendations. A written personalized care plan for preventive services as well as general preventive health recommendations were provided to patient.     [facility] LPN   [id number]  Nurse Notes: pt is doing well:has no concerns or questions

## 2022-04-25 NOTE — Patient Instructions (Signed)
Philip Woodard , Thank you for taking time to come for your Medicare Wellness Visit. I appreciate your ongoing commitment to your health goals. Please review the following plan we discussed and let me know if I can assist you in the future.   These are the goals we discussed:  Goals      Patient Stated     My goal is to reduce my weight by 10 pounds and continue to be independent and physically active.        This is a list of the screening recommended for you and due dates:  Health Maintenance  Topic Date Due   Zoster (Shingles) Vaccine (1 of 2) Never done   Medicare Annual Wellness Visit  04/26/2023   DTaP/Tdap/Td vaccine (3 - Td or Tdap) 02/21/2026   Colon Cancer Screening  05/26/2028   Pneumonia Vaccine  Completed   Flu Shot  Completed   Hepatitis C Screening: USPSTF Recommendation to screen - Ages 18-79 yo.  Completed   HPV Vaccine  Aged Out   COVID-19 Vaccine  Discontinued    Advanced directives: no..mailing information  Conditions/risks identified: none  Next appointment: Follow up in one year for your annual wellness visit. 04/30/2023 @2pm$  telephone  Preventive Care 65 Years and Older, Male  Preventive care refers to lifestyle choices and visits with your health care provider that can promote health and wellness. What does preventive care include? A yearly physical exam. This is also called an annual well check. Dental exams once or twice a year. Routine eye exams. Ask your health care provider how often you should have your eyes checked. Personal lifestyle choices, including: Daily care of your teeth and gums. Regular physical activity. Eating a healthy diet. Avoiding tobacco and drug use. Limiting alcohol use. Practicing safe sex. Taking low doses of aspirin every day. Taking vitamin and mineral supplements as recommended by your health care provider. What happens during an annual well check? The services and screenings done by your health care provider during  your annual well check will depend on your age, overall health, lifestyle risk factors, and family history of disease. Counseling  Your health care provider may ask you questions about your: Alcohol use. Tobacco use. Drug use. Emotional well-being. Home and relationship well-being. Sexual activity. Eating habits. History of falls. Memory and ability to understand (cognition). Work and work Statistician. Screening  You may have the following tests or measurements: Height, weight, and BMI. Blood pressure. Lipid and cholesterol levels. These may be checked every 5 years, or more frequently if you are over 27 years old. Skin check. Lung cancer screening. You may have this screening every year starting at age 12 if you have a 30-pack-year history of smoking and currently smoke or have quit within the past 15 years. Fecal occult blood test (FOBT) of the stool. You may have this test every year starting at age 31. Flexible sigmoidoscopy or colonoscopy. You may have a sigmoidoscopy every 5 years or a colonoscopy every 10 years starting at age 4. Prostate cancer screening. Recommendations will vary depending on your family history and other risks. Hepatitis C blood test. Hepatitis B blood test. Sexually transmitted disease (STD) testing. Diabetes screening. This is done by checking your blood sugar (glucose) after you have not eaten for a while (fasting). You may have this done every 1-3 years. Abdominal aortic aneurysm (AAA) screening. You may need this if you are a current or former smoker. Osteoporosis. You may be screened starting at age 93  if you are at high risk. Talk with your health care provider about your test results, treatment options, and if necessary, the need for more tests. Vaccines  Your health care provider may recommend certain vaccines, such as: Influenza vaccine. This is recommended every year. Tetanus, diphtheria, and acellular pertussis (Tdap, Td) vaccine. You may need  a Td booster every 10 years. Zoster vaccine. You may need this after age 38. Pneumococcal 13-valent conjugate (PCV13) vaccine. One dose is recommended after age 57. Pneumococcal polysaccharide (PPSV23) vaccine. One dose is recommended after age 31. Talk to your health care provider about which screenings and vaccines you need and how often you need them. This information is not intended to replace advice given to you by your health care provider. Make sure you discuss any questions you have with your health care provider. Document Released: 03/26/2015 Document Revised: 11/17/2015 Document Reviewed: 12/29/2014 Elsevier Interactive Patient Education  2017 [place] Prevention in the Home Falls can cause injuries. They can happen to people of all ages. There are many things you can do to make your home safe and to help prevent falls. What can I do on the outside of my home? Regularly fix the edges of walkways and driveways and fix any cracks. Remove anything that might make you trip as you walk through a door, such as a raised step or threshold. Trim any bushes or trees on the path to your home. Use bright outdoor lighting. Clear any walking paths of anything that might make someone trip, such as rocks or tools. Regularly check to see if handrails are loose or broken. Make sure that both sides of any steps have handrails. Any raised decks and porches should have guardrails on the edges. Have any leaves, snow, or ice cleared regularly. Use sand or salt on walking paths during winter. Clean up any spills in your garage right away. This includes oil or grease spills. What can I do in the bathroom? Use night lights. Install grab bars by the toilet and in the tub and shower. Do not use towel bars as grab bars. Use non-skid mats or decals in the tub or shower. If you need to sit down in the shower, use a plastic, non-slip stool. Keep the floor dry. Clean up any water that spills on the  floor as soon as it happens. Remove soap buildup in the tub or shower regularly. Attach bath mats securely with double-sided non-slip rug tape. Do not have throw rugs and other things on the floor that can make you trip. What can I do in the bedroom? Use night lights. Make sure that you have a light by your bed that is easy to reach. Do not use any sheets or blankets that are too big for your bed. They should not hang down onto the floor. Have a firm chair that has side arms. You can use this for support while you get dressed. Do not have throw rugs and other things on the floor that can make you trip. What can I do in the kitchen? Clean up any spills right away. Avoid walking on wet floors. Keep items that you use a lot in easy-to-reach places. If you need to reach something above you, use a strong step stool that has a grab bar. Keep electrical cords out of the way. Do not use floor polish or wax that makes floors slippery. If you must use wax, use non-skid floor wax. Do not have throw rugs and other things  on the floor that can make you trip. What can I do with my stairs? Do not leave any items on the stairs. Make sure that there are handrails on both sides of the stairs and use them. Fix handrails that are broken or loose. Make sure that handrails are as long as the stairways. Check any carpeting to make sure that it is firmly attached to the stairs. Fix any carpet that is loose or worn. Avoid having throw rugs at the top or bottom of the stairs. If you do have throw rugs, attach them to the floor with carpet tape. Make sure that you have a light switch at the top of the stairs and the bottom of the stairs. If you do not have them, ask someone to add them for you. What else can I do to help prevent falls? Wear shoes that: Do not have high heels. Have rubber bottoms. Are comfortable and fit you well. Are closed at the toe. Do not wear sandals. If you use a stepladder: Make sure that  it is fully opened. Do not climb a closed stepladder. Make sure that both sides of the stepladder are locked into place. Ask someone to hold it for you, if possible. Clearly mark and make sure that you can see: Any grab bars or handrails. First and last steps. Where the edge of each step is. Use tools that help you move around (mobility aids) if they are needed. These include: Canes. Walkers. Scooters. Crutches. Turn on the lights when you go into a dark area. Replace any light bulbs as soon as they burn out. Set up your furniture so you have a clear path. Avoid moving your furniture around. If any of your floors are uneven, fix them. If there are any pets around you, be aware of where they are. Review your medicines with your doctor. Some medicines can make you feel dizzy. This can increase your chance of falling. Ask your doctor what other things that you can do to help prevent falls. This information is not intended to replace advice given to you by your health care provider. Make sure you discuss any questions you have with your health care provider. Document Released: 12/24/2008 Document Revised: 08/05/2015 Document Reviewed: 04/03/2014 Elsevier Interactive Patient Education  2017 [employer].

## 2022-04-28 ENCOUNTER — Other Ambulatory Visit: Payer: Self-pay | Admitting: Emergency Medicine

## 2022-05-01 NOTE — Progress Notes (Signed)
Office Visit Note  Patient: Philip Woodard             Date of Birth: 02-24-1951           MRN: [id number]             PCP: Horald Pollen, MD Referring: Horald Pollen, * Visit Date: 05/15/2022 Occupation: '@GUAROCC'$ @  Subjective:  Medication maintenance   History of Present Illness: Philip Woodard is a 72 y.o. male with history of gout, osteoarthritis, sarcoidosis and polymyalgia rheumatica. Patient is feeling very well today. He has been tapering off of prednisone the past six months and now takes prednisone 1 mg daily. He has been taking that dose for two months and plans to come off of the prednisone entirely. States he is also still taking Hydroxychloroquine 200 mg BID though this is not on his medication list in his chart. Records indicate he received 11 refills of the medication a few months ago from Dr. Prudence Davidson. He denies any flares of joint pain, joint swelling or muscle pain since tapering the dose. He continues to follow up pulmonology months for sarcoidosis. His last appointment was in 10/2021 but he did not return for three month follow up. States he will contact their office to schedule. He denies rashes or shortness of breath. He has history of DDD of the cervical and lumbar spine. He does not have any neck pain today. Continues to have lower back pain but states this has been unchanged for several years. He has not had any recent falls. He exercises and plays piano regularly.   Activities of Daily Living:  Patient reports morning stiffness for 0 minute.   Patient Denies nocturnal pain.  Difficulty dressing/grooming: Denies Difficulty climbing stairs: Denies Difficulty getting out of chair: Denies Difficulty using hands for taps, buttons, cutlery, and/or writing: Denies  Review of Systems  Constitutional: Negative.  Negative for fatigue.  HENT: Negative.  Negative for mouth sores and mouth dryness.   Eyes: Negative.  Negative for dryness.  Respiratory:  Negative.  Negative for shortness of breath.   Cardiovascular: Negative.  Negative for chest pain and palpitations.  Gastrointestinal: Negative.  Negative for blood in stool, constipation and diarrhea.  Endocrine: Negative.  Negative for increased urination.  Genitourinary: Negative.  Negative for involuntary urination.  Musculoskeletal: Negative.  Negative for joint pain, gait problem, joint pain, joint swelling, myalgias, muscle weakness, morning stiffness, muscle tenderness and myalgias.  Skin: Negative.  Negative for pallor, rash, hair loss and sensitivity to sunlight.  Allergic/Immunologic: Negative.  Negative for susceptible to infections.  Neurological: Negative.  Negative for dizziness and headaches.  Hematological: Negative.  Negative for swollen glands.  Psychiatric/Behavioral: Negative.  Negative for depressed mood and sleep disturbance. The patient is not nervous/anxious.     PMFS History:  Patient Active Problem List   Diagnosis Date Noted   NCGS (non-celiac gluten sensitivity) 01/25/2022   Internal hemorrhoids 01/25/2022   Rectal bleeding 01/25/2022   Cervical radiculopathy 10/17/2021   Dyspepsia 07/20/2021   Lower urinary tract symptoms 07/20/2021   Chronic idiopathic gout involving toe of left foot without tophus 04/27/2021   Stage 3a chronic kidney disease ([place]) 03/31/2021   Erectile dysfunction due to arterial insufficiency 03/31/2021   NASH (nonalcoholic steatohepatitis) 02/22/2015   Arthritis of right lower extremity 07/20/2014   Hearing loss 02/19/2014   OSA on CPAP 05/02/2013   Hypothyroid 03/29/2012   Primary hypertension 11/24/2008   HYPOTHYROIDISM, POSTSURGICAL 07/07/2008   HYPERTROPHY PROSTATE W/UR  OBST & OTH LUTS 07/07/2008   DISC DISEASE, LUMBAR 12/27/2007   Sarcoidosis 06/06/2007   GOITER, MULTINODULAR 06/06/2007   HEMORRHOIDS 06/06/2007   ASTHMA 06/06/2007    Past Medical History:  Diagnosis Date   Alkaline phosphatase raised 06/28/2011    Fluctuating - suspect due to sarcoidosis   Allergic rhinitis    Allergy    Arthritis    knees   Genital herpes    GERD (gastroesophageal reflux disease)    Hemorrhoids    Hyperglycemia    Hyperplastic lymph node 06/27/2011   Submental node excised 1/02; regrowth: re-excision 10/06   Hypertension    Hypothyroidism    Multinodular goiter    Rheumatoid arteritis ([place])    Routine general medical examination at a health care facility    Sarcoidosis    Sleep apnea    wears cpap     Family History  Problem Relation Age of Onset   Hypertension Mother    Dementia Father    Sleep apnea Brother    Healthy Brother    Sleep apnea Paternal Aunt    Asthma Cousin    Healthy Daughter    Healthy Daughter    Sarcoidosis Neg Hx    Colon polyps Neg Hx    Colon cancer Neg Hx    Esophageal cancer Neg Hx    Stomach cancer Neg Hx    Rectal cancer Neg Hx    Past Surgical History:  Procedure Laterality Date   COLONOSCOPY  5-23/2003   COLONOSCOPY  02/04/2018   TA   COLONOSCOPY WITH PROPOFOL  05/26/2021   Lucio [facility] at [facility] Bilateral [id number]   THYROIDECTOMY  2002   Social History   Social History Narrative   Not on file   Immunization History  Administered Date(s) Administered   Fluad Quad(high Dose 65+) 12/11/2018, 01/20/2020, 03/31/2021, 01/25/2022   Influenza Whole 12/11/2009, 12/11/2011   Influenza, High Dose Seasonal PF 11/14/2017   Influenza,inj,Quad PF,6+ Mos 01/31/2013, 12/10/2013, 02/22/2016, 12/19/2016   Moderna Sars-Covid-2 Vaccination 03/18/2020   PFIZER(Purple Top)SARS-COV-2 Vaccination 04/04/2019, 04/25/2019   PNEUMOCOCCAL CONJUGATE-20 01/25/2022   Pneumococcal Conjugate-13 11/14/2017   Pneumococcal Polysaccharide-23 01/31/2013   Td 10/23/2005   Tdap 02/22/2016   Zoster, Live 01/31/2013     Objective: Vital Signs: BP 133/77 (BP Location: Left Arm, Patient Position: Sitting, Cuff Size: Large)   Pulse 84   Resp 14   Ht  '5\' 7"'$  (1.702 m)   Wt 203 lb 12.8 oz (92.4 kg)   BMI 31.92 kg/m    Physical Exam Vitals and nursing note reviewed.  Constitutional:      Appearance: He is well-developed.  HENT:     Head: Normocephalic and atraumatic.  Eyes:     Conjunctiva/sclera: Conjunctivae normal.     Pupils: Pupils are equal, round, and reactive to light.  Cardiovascular:     Rate and Rhythm: Normal rate and regular rhythm.     Heart sounds: Normal heart sounds.  Pulmonary:     Effort: Pulmonary effort is normal.     Breath sounds: Normal breath sounds.  Abdominal:     General: Bowel sounds are normal.     Palpations: Abdomen is soft.  Musculoskeletal:     Cervical back: Normal range of motion and neck supple.  Skin:    General: Skin is warm and dry.     Capillary Refill: Capillary refill takes less than 2 seconds.  Neurological:  Mental Status: He is alert and oriented to person, place, and time.  Psychiatric:        Behavior: Behavior normal.      Musculoskeletal Exam: Cervical spine was in good range of motion. No spinal tenderness. Shoulder joints, elbow joints, wrist joints, MCPs PIPs and DIPs have good range of motion with no synovitis, warmth or effusion. Thickening of the DIP and PIP joints bilaterally. Good strength of upper extremities. Hip joints, knee joints, ankles, MTPs and PIPs with good range of motion with no synovitis, warmth or effusion.   CDAI Exam: CDAI Score: -- Patient Global: --; Provider Global: -- Swollen: --; Tender: -- Joint Exam 05/15/2022   No joint exam has been documented for this visit   There is currently no information documented on the homunculus. Go to the Rheumatology activity and complete the homunculus joint exam.  Investigation: No additional findings.  Imaging: No results found.  Recent Labs: Lab Results  Component Value Date   WBC 6.6 01/25/2022   HGB 13.7 01/25/2022   PLT 259.0 01/25/2022   NA 141 01/25/2022   K 3.9 01/25/2022   CL 107  01/25/2022   CO2 31 01/25/2022   GLUCOSE 84 01/25/2022   BUN 15 01/25/2022   CREATININE 1.29 01/25/2022   BILITOT 1.0 01/25/2022   ALKPHOS 61 01/25/2022   AST 163 (H) 01/25/2022   ALT 51 01/25/2022   PROT 6.6 01/25/2022   ALBUMIN 4.0 01/25/2022   CALCIUM 9.1 01/25/2022   GFRAA  02/20/2009    >60        The eGFR has been calculated using the MDRD equation. This calculation has not been validated in all clinical situations. eGFR's persistently <60 mL/min signify possible Chronic Kidney Disease.   [facility] NEGATIVE 07/08/2021    Speciality Comments: PLQ Eye Exam 09/23/2021 normal [facility] Ophthalmology f/u 12 months. MTX-dcd due to increase in Cr  Procedures:  No procedures performed Allergies: Other and Shellfish allergy   Assessment / Plan:     Visit Diagnoses: Polymyalgia rheumatica ([place]) - dxd by his PCP.  He has been tapering the prednisone and is now taking 1 mg once daily. He plans to taper off of the prednisone entirely. He denies any joint or muscle pains with the taper. He exercises regularly and had good strength on physical exam. CK was 531 on 01/10/2022. Will recheck today. He does strength training regularly. Plan to continue to taper off the prednisone.   High risk medication use - Patient states he is taking prednisone 1 mg daily and Hydroxychloroquine 200 mg BID. Medication was not listed on his chart today but records indicate he received 11 refills from Dr. Prudence Davidson a few months ago. He is going to check his medications at home and contact our office to verify if he is still taking it. PLQ eye exam was in 09/2021. Patient states he has been on prednisone for six months and has slowly been tapering off of it. He plans to taper off the prednisone entirely. He is aware of the side effects of prednisone. CBC and CMP completed in 01/2022. GFR was 56.53 and AST was 163. He followed up with nephrology regarding renal function. Liver function tests have not been checked  since. Unknown cause. Will recheck CBC and CMP with GFR today.   Elevated CK - CK was 531 on 01/10/2022.  In August myositis panel and HMG CR antibodies were negative.  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 He has good strength  on exam.  He had no muscular weakness on the examination today.  He had no difficulty getting up from the squatting position.  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  We will check CK level and aldolase today.  If his CK is elevated I may repeat CK without working out for a week in the future.  Sarcoidosis - Positive lymph node biopsy in 2001.  Treated with prednisone for 3 months for pulmonary symptoms. He was last seen by pulmonology in 10/2021 and recommended follow up was in 3 months. He was advised to schedule this follow up.   Chronic idiopathic gout involving toe of left foot without tophus - Related to diet per patient.  Uric acid was 3.2 in February 2023.  He is not taking any urate lowering agents. He tries to comply with a low purine diet. He has not had any flares.   Chondromalacia patellae, right knee - No pain today. No warmth or swelling on physical exam. Reinforced importance of routine exercise and muscle strengthening.   DDD (degenerative disc disease), cervical - He has chronic discomfort.  He is on gabapentin. No pain today. No tenderness.   DDD (degenerative disc disease), lumbar -close history of intermittent discomfort.  He had no point tenderness.  He had good mobility in his lumbar spine.  Levoscoliosis and multilevel spondylosis was noted on the previous x-rays.   Primary hypertension - Blood pressure was 133/77 today.   Stage 3a chronic kidney disease ([place]) - He has followed up with nephrology.   NASH (nonalcoholic steatohepatitis) - January 25, 2022 AST 163, ALT normal. Will check LFTs today.   Vitamin D deficiency - He takes vitamin D supplement.  Other medical concerns are listed below:  History of  hypothyroidism  History of asthma  Multinodular goiter  OSA on CPAP  Orders: Orders Placed This Encounter  Procedures   CBC with Differential/Platelet   COMPLETE METABOLIC PANEL WITH GFR   CK   Aldolase   No orders of the defined types were placed in this encounter.  Face-to-face time spent with patient was 30 minutes. Greater than 50% of time was spent in counseling and coordination of care.  Follow-Up Instructions: Return in about 5 months (around 10/15/2022) for PMR, Sarcoidosis.   Bo Merino, MD  Note - This record has been created using Editor, commissioning.  Chart creation errors have been sought, but may not always  have been located. Such creation errors do not reflect on  the standard of medical care.

## 2022-05-11 ENCOUNTER — Ambulatory Visit: Payer: Medicare HMO | Admitting: Gastroenterology

## 2022-05-15 ENCOUNTER — Telehealth: Payer: Self-pay

## 2022-05-15 ENCOUNTER — Ambulatory Visit: Payer: Medicare HMO | Attending: Rheumatology | Admitting: Rheumatology

## 2022-05-15 ENCOUNTER — Encounter: Payer: Self-pay | Admitting: Rheumatology

## 2022-05-15 VITALS — BP 133/77 | HR 84 | Resp 14 | Ht 67.0 in | Wt 203.8 lb

## 2022-05-15 DIAGNOSIS — K7581 Nonalcoholic steatohepatitis (NASH): Secondary | ICD-10-CM

## 2022-05-15 DIAGNOSIS — E042 Nontoxic multinodular goiter: Secondary | ICD-10-CM

## 2022-05-15 DIAGNOSIS — M5136 Other intervertebral disc degeneration, lumbar region: Secondary | ICD-10-CM

## 2022-05-15 DIAGNOSIS — E559 Vitamin D deficiency, unspecified: Secondary | ICD-10-CM | POA: Diagnosis not present

## 2022-05-15 DIAGNOSIS — G4733 Obstructive sleep apnea (adult) (pediatric): Secondary | ICD-10-CM

## 2022-05-15 DIAGNOSIS — M5030 Other cervical disc degeneration, unspecified cervical region: Secondary | ICD-10-CM | POA: Diagnosis not present

## 2022-05-15 DIAGNOSIS — I10 Essential (primary) hypertension: Secondary | ICD-10-CM

## 2022-05-15 DIAGNOSIS — N1831 Chronic kidney disease, stage 3a: Secondary | ICD-10-CM

## 2022-05-15 DIAGNOSIS — M1A0720 Idiopathic chronic gout, left ankle and foot, without tophus (tophi): Secondary | ICD-10-CM

## 2022-05-15 DIAGNOSIS — Z79899 Other long term (current) drug therapy: Secondary | ICD-10-CM

## 2022-05-15 DIAGNOSIS — M2241 Chondromalacia patellae, right knee: Secondary | ICD-10-CM | POA: Diagnosis not present

## 2022-05-15 DIAGNOSIS — R748 Abnormal levels of other serum enzymes: Secondary | ICD-10-CM

## 2022-05-15 DIAGNOSIS — Z8709 Personal history of other diseases of the respiratory system: Secondary | ICD-10-CM

## 2022-05-15 DIAGNOSIS — D869 Sarcoidosis, unspecified: Secondary | ICD-10-CM

## 2022-05-15 DIAGNOSIS — M353 Polymyalgia rheumatica: Secondary | ICD-10-CM

## 2022-05-15 DIAGNOSIS — Z8639 Personal history of other endocrine, nutritional and metabolic disease: Secondary | ICD-10-CM

## 2022-05-15 NOTE — Telephone Encounter (Signed)
Error

## 2022-05-15 NOTE — Patient Instructions (Signed)
Standing Labs We placed an order today for your standing lab work.   Please have your standing labs drawn in August  Please have your labs drawn 2 weeks prior to your appointment so that the provider can discuss your lab results at your appointment, if possible.  Please note that you may see your imaging and lab results in [place] before we have reviewed them. We will contact you once all results are reviewed. Please allow our office up to 72 hours to thoroughly review all of the results before contacting the office for clarification of your results.  WALK-IN LAB HOURS  Monday through Thursday from 8:00 am -12:30 pm and 1:00 pm-5:00 pm and Friday from 8:00 am-12:00 pm.  Patients with office visits requiring labs will be seen before walk-in labs.  You may encounter longer than normal wait times. Please allow additional time. Wait times may be shorter on  Monday and Thursday afternoons.  We do not book appointments for walk-in labs. We appreciate your patience and understanding with our staff.   Labs are drawn by [facility]. Please bring your co-pay at the time of your lab draw.  You may receive a bill from [facility] for your lab work.  Please note if you are on Hydroxychloroquine and and an order has been placed for a Hydroxychloroquine level,  you will need to have it drawn 4 hours or more after your last dose.  If you wish to have your labs drawn at another location, please call the office 24 hours in advance so we can fax the orders.  The office is located at [address], [place], [place], [place] [address]   If you have any questions regarding directions or hours of operation,  please call [phone].   As a reminder, please drink plenty of water prior to coming for your lab work. Thanks!   Vaccines You are taking a medication(s) that can suppress your immune system.  The following immunizations are recommended: Flu annually Covid-19  RSV Td/Tdap (tetanus, diphtheria, pertussis)  every 10 years Pneumonia (Prevnar 15 then Pneumovax 23 at least 1 year apart.  Alternatively, can take Prevnar 20 without needing additional dose) Shingrix: 2 doses from 4 weeks to 6 months apart  Please check with your PCP to make sure you are up to date.

## 2022-05-15 NOTE — Telephone Encounter (Signed)
Patient contacted the office and stated he is still taking the Plaquenil 200 mg 2 times a day.

## 2022-05-16 ENCOUNTER — Other Ambulatory Visit: Payer: Self-pay | Admitting: *Deleted

## 2022-05-16 ENCOUNTER — Encounter: Payer: Self-pay | Admitting: Gastroenterology

## 2022-05-16 ENCOUNTER — Ambulatory Visit: Payer: Medicare HMO | Admitting: Gastroenterology

## 2022-05-16 VITALS — BP 122/68 | HR 88 | Ht 67.0 in | Wt 203.2 lb

## 2022-05-16 DIAGNOSIS — K219 Gastro-esophageal reflux disease without esophagitis: Secondary | ICD-10-CM | POA: Diagnosis not present

## 2022-05-16 DIAGNOSIS — R748 Abnormal levels of other serum enzymes: Secondary | ICD-10-CM

## 2022-05-16 DIAGNOSIS — R1310 Dysphagia, unspecified: Secondary | ICD-10-CM | POA: Diagnosis not present

## 2022-05-16 NOTE — Progress Notes (Signed)
CK is high at 732.  Patient should get repeat CK after not working out for 1 week.  If repeat CK is elevated I will refer him to neurology for EMG and nerve conduction velocities.  CMP shows elevated creatinine at 1.43 which is elevated.  Liver function is mildly elevated and improved.  CBC is normal.  Please forward results to his PCP and nephrologist.

## 2022-05-16 NOTE — Progress Notes (Signed)
    Assessment     GERD, dysphagia resolved Elevated AST, improved Personal history of adenomatous colon polyps   Recommendations    Continue pantoprazole 40 mg daily, Tums as needed and follow antireflux measures Recheck LFTs in 1-2 months per PCP Surveillance colonoscopy recommended in March 2030 REV in 1 year   HPI    This is a 72 year old male returning for GERD and dysphagia.  He relates his dysphagia symptoms resolved following dilation.  His reflux symptoms are under very good control on daily pantoprazole.  He has rare transient breakthrough symptoms.  He has not noted any recent coughing or choking with liquids.  EGD Jan 2024   Labs / Imaging       Latest Ref Rng & Units 05/15/2022    9:34 AM 01/25/2022   10:06 AM 01/10/2022    9:20 AM  Hepatic Function  Total Protein 6.1 - 8.1 g/dL 6.8  6.6  6.8   Albumin 3.5 - 5.2 g/dL  4.0    AST 10 - 35 U/L 36  163  32   ALT 9 - 46 U/L 24  51  23   Alk Phosphatase 39 - 117 U/L  61    Total Bilirubin 0.2 - 1.2 mg/dL 0.7  1.0  0.7        Latest Ref Rng & Units 05/15/2022    9:34 AM 01/25/2022   10:06 AM 01/10/2022    9:20 AM  CBC  WBC 3.8 - 10.8 Thousand/uL 7.5  6.6  8.6   Hemoglobin 13.2 - 17.1 g/dL 14.4  13.7  14.2   Hematocrit 38.5 - 50.0 % 42.1  41.3  43.4   Platelets 140 - 400 Thousand/uL 278  259.0  276     Current Medications, Allergies, Past Medical History, Past Surgical History, Family History and Social History were reviewed in [employer] record.   Physical Exam: General: Well developed, well nourished, no acute distress Head: Normocephalic and atraumatic Eyes: Sclerae anicteric, EOMI Ears: Normal auditory acuity Mouth: No deformities or lesions noted Lungs: Clear throughout to auscultation Heart: Regular rate and rhythm; No murmurs, rubs or bruits Abdomen: Soft, non tender and non distended. No masses, hepatosplenomegaly or hernias noted. Normal Bowel sounds Rectal: Not  done Musculoskeletal: Symmetrical with no gross deformities  Pulses:  Normal pulses noted Extremities: No edema or deformities noted Neurological: Alert oriented x 4, grossly nonfocal Psychological:  Alert and cooperative. Normal mood and affect   Torien Ramroop T. Fuller Plan, MD 05/16/2022, 9:39 AM

## 2022-05-16 NOTE — Patient Instructions (Signed)
Stay on pantoprazole daily.   The [facility] providers would like to encourage you to use [facility] to communicate with providers for non-urgent requests or questions.  Due to long hold times on the telephone, sending your provider a message by [facility] may be a faster and more efficient way to get a response.  Please allow 48 business hours for a response.  Please remember that this is for non-urgent requests.   Thank you for choosing me and [facility].  Pricilla Riffle. Dagoberto Ligas., MD., Marval Regal

## 2022-05-17 LAB — COMPLETE METABOLIC PANEL WITH GFR
AG Ratio: 1.7 (calc) (ref 1.0–2.5)
ALT: 24 U/L (ref 9–46)
AST: 36 U/L — ABNORMAL HIGH (ref 10–35)
Albumin: 4.3 g/dL (ref 3.6–5.1)
Alkaline phosphatase (APISO): 77 U/L (ref 35–144)
BUN/Creatinine Ratio: 13 (calc) (ref 6–22)
BUN: 19 mg/dL (ref 7–25)
CO2: 28 mmol/L (ref 20–32)
Calcium: 10 mg/dL (ref 8.6–10.3)
Chloride: 106 mmol/L (ref 98–110)
Creat: 1.43 mg/dL — ABNORMAL HIGH (ref 0.70–1.35)
Globulin: 2.5 g/dL (calc) (ref 1.9–3.7)
Glucose, Bld: 83 mg/dL (ref 65–99)
Potassium: 4.5 mmol/L (ref 3.5–5.3)
Sodium: 143 mmol/L (ref 135–146)
Total Bilirubin: 0.7 mg/dL (ref 0.2–1.2)
Total Protein: 6.8 g/dL (ref 6.1–8.1)
eGFR: 53 mL/min/{1.73_m2} — ABNORMAL LOW (ref >=60)

## 2022-05-17 LAB — CBC WITH DIFFERENTIAL/PLATELET
Absolute Monocytes: 900 cells/uL (ref 200–950)
Basophils Absolute: 90 cells/uL (ref 0–200)
Basophils Relative: 1.2 %
Eosinophils Absolute: 308 cells/uL (ref 15–500)
Eosinophils Relative: 4.1 %
HCT: 42.1 % (ref 38.5–50.0)
Hemoglobin: 14.4 g/dL (ref 13.2–17.1)
Lymphs Abs: 1943 cells/uL (ref 850–3900)
MCH: 30.5 pg (ref 27.0–33.0)
MCHC: 34.2 g/dL (ref 32.0–36.0)
MCV: 89.2 fL (ref 80.0–100.0)
MPV: 10.8 fL (ref 7.5–12.5)
Monocytes Relative: 12 %
Neutro Abs: 4260 cells/uL (ref 1500–7800)
Neutrophils Relative %: 56.8 %
Platelets: 278 10*3/uL (ref 140–400)
RBC: 4.72 10*6/uL (ref 4.20–5.80)
RDW: 13.5 % (ref 11.0–15.0)
Total Lymphocyte: 25.9 %
WBC: 7.5 10*3/uL (ref 3.8–10.8)

## 2022-05-17 LAB — CK: Total CK: 732 U/L — ABNORMAL HIGH (ref 44–196)

## 2022-05-17 LAB — ALDOLASE: Aldolase: 11.1 U/L — ABNORMAL HIGH (ref <=8.1)

## 2022-05-18 ENCOUNTER — Telehealth: Payer: Self-pay | Admitting: *Deleted

## 2022-05-18 MED ORDER — GABAPENTIN 300 MG PO CAPS: 300.0000 mg | ORAL_CAPSULE | Freq: Three times a day (TID) | ORAL | 1 refills | Status: DC

## 2022-05-18 NOTE — Telephone Encounter (Signed)
-----   Message from Elberta Spaniel sent at 05/18/2022  3:49 PM EST ----- Regarding: Patient req Rx refill --Gabapentin Pt request Refill on:  gabapentin (NEURONTIN) 300 MG capsule [id number]   Order Details  Dose: 300 mg Route: Oral Frequency: 3 times daily Dispense Quantity: 90 capsule Refills: 1      Sig: Take 1 capsule (300 mg total) by mouth 3 (three) times daily.      --Pt uses :  [id number] [place] Phone: [phone]: [phone]    Thx

## 2022-05-18 NOTE — Telephone Encounter (Signed)
Refill sent.

## 2022-05-22 ENCOUNTER — Encounter: Payer: Self-pay | Admitting: Emergency Medicine

## 2022-05-22 DIAGNOSIS — Z125 Encounter for screening for malignant neoplasm of prostate: Secondary | ICD-10-CM

## 2022-05-22 DIAGNOSIS — N5201 Erectile dysfunction due to arterial insufficiency: Secondary | ICD-10-CM

## 2022-05-23 MED ORDER — SILDENAFIL CITRATE 20 MG PO TABS: ORAL_TABLET | ORAL | 5 refills | Status: DC

## 2022-05-23 NOTE — Telephone Encounter (Signed)
Okay to do both.

## 2022-05-25 ENCOUNTER — Other Ambulatory Visit: Payer: Self-pay | Admitting: *Deleted

## 2022-05-25 ENCOUNTER — Other Ambulatory Visit (INDEPENDENT_AMBULATORY_CARE_PROVIDER_SITE_OTHER): Payer: Medicare HMO

## 2022-05-25 DIAGNOSIS — Z125 Encounter for screening for malignant neoplasm of prostate: Secondary | ICD-10-CM | POA: Diagnosis not present

## 2022-05-25 DIAGNOSIS — R748 Abnormal levels of other serum enzymes: Secondary | ICD-10-CM

## 2022-05-25 LAB — PSA: PSA: 1.46 ng/mL (ref 0.10–4.00)

## 2022-05-26 ENCOUNTER — Other Ambulatory Visit: Payer: Self-pay | Admitting: *Deleted

## 2022-05-26 DIAGNOSIS — R748 Abnormal levels of other serum enzymes: Secondary | ICD-10-CM

## 2022-05-26 LAB — CK: Total CK: 327 U/L — ABNORMAL HIGH (ref 44–196)

## 2022-05-26 NOTE — Progress Notes (Signed)
CK is 327 still elevated but improved.  I would recommend a EMG and nerve conduction velocity.  Please refer to neurology for evaluation.

## 2022-05-29 ENCOUNTER — Other Ambulatory Visit (HOSPITAL_COMMUNITY): Payer: Self-pay

## 2022-05-29 ENCOUNTER — Other Ambulatory Visit: Payer: Self-pay | Admitting: Emergency Medicine

## 2022-05-29 NOTE — Telephone Encounter (Signed)
Patient needs a prior auth for his sildenafil

## 2022-05-29 NOTE — Telephone Encounter (Signed)
Please advise on different med

## 2022-05-29 NOTE — Telephone Encounter (Signed)
Doubt any of the other erectile dysfunction medications will be covered.  Sildenafil happens to be the least expensive.  He can always pay out-of-pocket.  Thanks.

## 2022-05-29 NOTE — Telephone Encounter (Signed)
Medicare Part D will only approve this if patient is taking for diagnosis of pulmonary arterial hypertension.

## 2022-06-26 ENCOUNTER — Encounter: Payer: Self-pay | Admitting: *Deleted

## 2022-06-28 DIAGNOSIS — M5416 Radiculopathy, lumbar region: Secondary | ICD-10-CM | POA: Diagnosis not present

## 2022-06-28 DIAGNOSIS — M9903 Segmental and somatic dysfunction of lumbar region: Secondary | ICD-10-CM | POA: Diagnosis not present

## 2022-06-28 DIAGNOSIS — M5136 Other intervertebral disc degeneration, lumbar region: Secondary | ICD-10-CM | POA: Diagnosis not present

## 2022-07-26 ENCOUNTER — Ambulatory Visit: Payer: Medicare HMO | Admitting: Emergency Medicine

## 2022-08-07 NOTE — Telephone Encounter (Signed)
erroneous

## 2022-08-10 ENCOUNTER — Encounter: Payer: Self-pay | Admitting: Neurology

## 2022-08-10 ENCOUNTER — Ambulatory Visit: Payer: Medicare HMO | Admitting: Neurology

## 2022-08-10 VITALS — BP 131/69 | HR 71 | Ht 67.0 in | Wt 208.0 lb

## 2022-08-10 DIAGNOSIS — E559 Vitamin D deficiency, unspecified: Secondary | ICD-10-CM | POA: Diagnosis not present

## 2022-08-10 DIAGNOSIS — R52 Pain, unspecified: Secondary | ICD-10-CM | POA: Diagnosis not present

## 2022-08-10 DIAGNOSIS — R799 Abnormal finding of blood chemistry, unspecified: Secondary | ICD-10-CM | POA: Diagnosis not present

## 2022-08-10 DIAGNOSIS — R6889 Other general symptoms and signs: Secondary | ICD-10-CM | POA: Diagnosis not present

## 2022-08-10 DIAGNOSIS — G8929 Other chronic pain: Secondary | ICD-10-CM

## 2022-08-10 DIAGNOSIS — G4733 Obstructive sleep apnea (adult) (pediatric): Secondary | ICD-10-CM

## 2022-08-10 DIAGNOSIS — R7989 Other specified abnormal findings of blood chemistry: Secondary | ICD-10-CM | POA: Diagnosis not present

## 2022-08-10 DIAGNOSIS — M545 Low back pain, unspecified: Secondary | ICD-10-CM

## 2022-08-10 DIAGNOSIS — R748 Abnormal levels of other serum enzymes: Secondary | ICD-10-CM

## 2022-08-10 DIAGNOSIS — M7918 Myalgia, other site: Secondary | ICD-10-CM | POA: Diagnosis not present

## 2022-08-10 DIAGNOSIS — Z79899 Other long term (current) drug therapy: Secondary | ICD-10-CM | POA: Diagnosis not present

## 2022-08-10 NOTE — Patient Instructions (Addendum)
It was nice to see you again today.  We will check blood work today and call you with the test results. We will do an EMG and nerve conduction velocity test, which is an electrical nerve and muscle test, which we will schedule. We will call you with the results. Please keep your appointment with [facility] for sleep apnea recheck as scheduled for next year.

## 2022-08-10 NOTE — Progress Notes (Signed)
Subjective:    Patient ID: Philip [facility]ard is a 72 y.o. male.  HPI    Philip Foley, MD, PhD [facility] [address] [address] [place], [place] [address]  Dear Philip Woodard,   I saw the patient, Philip Woodard, upon your kind request in my neurologic clinic today for evaluation of his elevated muscle enzymes.  The patient is unaccompanied today.  As you know, Philip Woodard is a 72 year old male with an underlying medical history of sarcoidosis, gout, chondromalacia, polymyalgia rheumatica, hypertension, chronic kidney disease, NASH, vitamin D deficiency, allergies, arthritis, reflux disease, hypothyroidism, obstructive sleep apnea on AutoPap therapy, and mild obesity, who reports an approx. 1 year Hx of diffuse aching pains throughout his body, primarily his limbs.  No specific joint pain, no one-sided weakness or numbness or tingling, overall, no sensory symptoms.  He has not noticed any muscle twitching.  He has not fallen.  He feels a little better, he is currently no longer on prednisone, tapered it off about 6 to 8 weeks ago and about a month ago he ran out of his gabapentin which was originally started by sports medicine and he has not noticed any repercussions after stopping the gabapentin.  He denies any family history of neuromuscular diseases, denies any personal history of difficulty chewing, swallowing, speaking, or singing, or breathing.  No double vision, has routine eye appointments once a year with Dr. Arnell [facility] at [facility].  He does have cataracts and prescription eyeglasses.  I reviewed your office note from 05/15/2022.  He has had elevated CK levels.  They have recently come down, upon recheck on 05/25/2022 his CK level was 327.  An EMG and nerve conduction velocity test was discussed.  His CK level was elevated to 1354 in 2010, the next highest level was in early March 2024 at 732.  He has had CK level in the 300s in 2023.  Other laboratory  tests were reviewed in his chart.  Vitamin D level on 01/10/2022 was on the low end of normal at 32.  He has had elevated monocytes.  Celiac panel in November 2023 was normal.  Aldolase on 05/15/2022 was elevated at 11.1.  TSH on 09/30/2021 was elevated at 6.82.  We have followed him and sleep clinic since 2022.  He continues to be compliant with his AutoPap machine, last months compliance data shows 93% compliance for more than 4 hours with good apnea control but leak is elevated from the mask.  Previously:   He saw Philip Pearson NP on 03/16/2022, at which time he was compliant with his AutoPap and doing well.  He was advised to follow-up routinely in 1 year.  He saw Philip Penny, NP on 03/16/2021, at which time he was compliant with his AutoPap machine and advised to routinely follow-up in 1 year.   03/16/20: 72 year old right-handed gentleman with an underlying medical history of sarcoidosis, hypothyroidism, arthritis, allergic rhinitis, and borderline obesity, who was previously diagnosed with obstructive sleep apnea and placed on CPAP therapy.  Prior sleep study results are not available for my review today.  I reviewed your office note from 01/15/2020.  His Epworth sleepiness score is 10 of 24, fatigue severity score is 11 out of 63. He has benefited from treatment.  He is fully compliant with his AutoPap.  He reports that he had a sleep study originally at [facility] in 2000.  He had a home sleep test some for 5 years ago he recalls.  His current machine works well.  He does use a full facemask, he brought his machine and his mask, he uses a large F 20 fullface mask.  He does notice the leak at times.  He also has mouth dryness.  He generally goes to bed between 11 and midnight and rise time is between 530 and 6.  He denies any recurrent morning headaches or nighttime nocturia.  He is married and lives with his wife.  He is semiretired.  He works as a [facility], as [facility] at this time.  They have 2 grown daughters, he has one 25-year-old granddaughter as well.  His older daughter lives in [place], younger daughter in [place].  Younger daughter has sleep apnea, he has a brother with sleep apnea as well.  He is a non-smoker and drinks alcohol rarely, maybe once a month and no day-to-day caffeine, occasional coffee during the week. I was able to review his CPAP compliance data from 02/15/2020 through 03/15/2020, which is a total of 30 days, during which time he used his machine every night, with percent use days greater than 4 hours at 97%, indicating excellent compliance with an average usage of 6 hours and 6 minutes, residual AHI at goal at 3/h, 95th percentile of pressure at 13.6 cm with a range of 4 to 16 cm with EPR.  He is on AutoPap, he has an air sense 10 auto machine from [facility], set up date according to online records was 10/28/2015.  His air leakage is on the high side fairly consistently with the 95th percentile at 50.8 L/min.  His DME company is [facility].   His Past Medical History Is Significant For: Past Medical History:  Diagnosis Date   Alkaline phosphatase raised 06/28/2011   Fluctuating - suspect due to sarcoidosis   Allergic rhinitis    Allergy    Arthritis    knees   Genital herpes    GERD (gastroesophageal reflux disease)    Hemorrhoids    Hyperglycemia    Hyperplastic lymph node 06/27/2011   Submental node excised 1/02; regrowth: re-excision 10/06   Hypertension    Hypothyroidism    Multinodular goiter    Rheumatoid arteritis (HCC)    Routine general medical examination at a [facility] facility    Sarcoidosis    Sleep apnea    wears cpap     His Past Surgical History Is Significant For: Past Surgical History:  Procedure Laterality Date   COLONOSCOPY  5-23/2003   COLONOSCOPY  02/04/2018   TA   COLONOSCOPY WITH PROPOFOL  05/26/2021   [facility]   POLYPECTOMY     ROTATOR CUFF REPAIR Bilateral [id number]    THYROIDECTOMY  2002    His Family History Is Significant For: Family History  Problem Relation Age of Onset   Hypertension Mother    Dementia Father    Sleep apnea Brother    Healthy Brother    Sleep apnea Paternal Aunt    Asthma Cousin    Healthy Daughter    Healthy Daughter    Sarcoidosis Neg Hx    Colon polyps Neg Hx    Colon cancer Neg Hx    Esophageal cancer Neg Hx    Stomach cancer Neg Hx    Rectal cancer Neg Hx     His Social History Is Significant For: Social History   Socioeconomic History   Marital status: Married    Spouse name: Not on file  Number of children: 2   Years of education: Not on file   Highest education level: Not on file  Occupational History   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  Tobacco Use   Smoking status: Never    Passive exposure: Never   Smokeless tobacco: Never  Vaping Use   Vaping Use: Never used  Substance and Sexual Activity   Alcohol use: Yes    Comment: 3 times per month   Drug use: Never   Sexual activity: Not on file  Other Topics Concern   Not on file  Social History Narrative   Not on file   Social Determinants of [facility]   Financial Resource Strain: Low Risk  (04/25/2022)   Overall Financial Resource Strain (CARDIA)    Difficulty of Paying Living Expenses: Not hard at all  Food Insecurity: No Food Insecurity (04/04/2021)   Hunger Vital Sign    Worried About Running Out of Food in the Last Year: Never true    Ran Out of Food in the Last Year: Never true  Transportation Needs: No Transportation Needs (04/25/2022)   PRAPARE - Administrator, Civil Service (Medical): No    Lack of Transportation (Non-Medical): No  Physical Activity: Sufficiently Active (04/25/2022)   Exercise Vital Sign    Days of Exercise per Week: 5 days    Minutes of Exercise per Session: 60 min  Stress: No Stress Concern Present (04/25/2022)   [employer] of Occupational [facility] - Occupational Stress Questionnaire    Feeling of  Stress : Not at all  Social Connections: Moderately Integrated (04/25/2022)   Social Connection and Isolation Panel [NHANES]    Frequency of Communication with Friends and Family: Twice a week    Frequency of Social Gatherings with Friends and Family: Once a week    Attends Religious Services: More than 4 times per year    Active Member of [employer] or Organizations: No    Attends Banker Meetings: Never    Marital Status: Married    His Allergies Are:  Allergies  Allergen Reactions   Other     SEAFOOD   Shellfish Allergy Swelling    Tongue and facial Swelling Tongue and facial Swelling  :   His Current Medications Are:  Outpatient Encounter Medications as of 08/10/2022  Medication Sig   acetaminophen (TYLENOL) 650 MG CR tablet Take 650 mg by mouth every 8 (eight) hours as needed.   acyclovir (ZOVIRAX) 800 MG tablet TAKE 1 TABLET BY MOUTH EVERY DAY   amLODipine (NORVASC) 10 MG tablet TAKE 1 TABLET BY MOUTH EVERY DAY   cholecalciferol (VITAMIN D3) 25 MCG (1000 UT) tablet Take 1,000 Units by mouth daily.   gabapentin (NEURONTIN) 300 MG capsule Take 1 capsule (300 mg total) by mouth 3 (three) times daily.   Glucosamine-Chondroit-Vit C-Mn (GLUCOSAMINE 1500 COMPLEX) CAPS Take by mouth daily.   hydroxychloroquine (PLAQUENIL) 200 MG tablet Take by mouth 2 (two) times daily.   levothyroxine (SYNTHROID) 200 MCG tablet Take 1 tablet (200 mcg total) by mouth daily.   Multiple Vitamin (MULTIVITAMIN) LIQD Take 5 mLs by mouth daily.   Omega-3 Fatty Acids (FISH OIL) 1000 MG CAPS Take 2 capsules by mouth daily.   pantoprazole (PROTONIX) 40 MG tablet Take 1 tablet (40 mg total) by mouth daily.   predniSONE (DELTASONE) 1 MG tablet Take 2 tablets (2 mg total) by mouth daily with breakfast.   sildenafil (REVATIO) 20 MG tablet Take 5 tablets as directed 30-45 minutes prior to  intercourse   tamsulosin (FLOMAX) 0.4 MG CAPS capsule TAKE 1 CAPSULE BY MOUTH EVERY DAY   Turmeric 500 MG TABS Take 1  tablet by mouth 2 (two) times daily.   valsartan (DIOVAN) 160 MG tablet TAKE 1 TABLET BY MOUTH EVERY DAY   zinc gluconate 50 MG tablet Take 50 mg by mouth daily.   No facility-administered encounter medications on file as of 08/10/2022.  :   Review of Systems:  Out of a complete 14 point review of systems, all are reviewed and negative with the exception of these symptoms as listed below:   Review of Systems  Neurological:        Pt states here to get a EMG consult     Objective:  Neurological Exam  Physical Exam Physical Examination:   Vitals:   08/10/22 0804  BP: 131/69  Pulse: 71    General Examination: The patient is a very pleasant 72 y.o. male in no acute distress. He appears well-developed and well-nourished and well groomed.   HEENT: Normocephalic, atraumatic, pupils are equal, round and reactive to light, bilateral cataracts noted, corrective eyeglasses in place.  Extraocular tracking is good without limitation to gaze excursion or nystagmus noted. Hearing is grossly intact. Face is symmetric with normal facial animation and normal facial sensation to light touch, temperature, vibration and pinprick. Speech is clear with no dysarthria noted, no nasal speech. There is no hypophonia. There is no lip, neck/head, jaw or voice tremor. Neck is supple with full range of passive and active motion. There are no carotid bruits on auscultation. Oropharynx exam reveals: mild mouth dryness, no abnormality noted.   Chest: Clear to auscultation without wheezing, rhonchi or crackles noted.  Heart: S1+S2+0, regular and normal without murmurs, rubs or gallops noted.   Abdomen: Soft, non-tender and non-distended.  Extremities: There is no pitting edema in the distal lower extremities bilaterally.   Skin: Warm and dry without trophic changes noted.   Musculoskeletal: exam reveals no obvious joint deformities.  Left shoulder higher than right.  Possible mild  scoliosis?  Neurologically:  Mental status: The patient is awake, alert and oriented in all 4 spheres. His immediate and remote memory, attention, language skills and fund of knowledge are appropriate. There is no evidence of aphasia, agnosia, apraxia or anomia. Speech is clear with normal prosody and enunciation. Thought process is linear. Mood is normal and affect is normal.  Cranial nerves II - XII are as described above under HEENT exam.  Motor exam: Normal bulk, strength and tone is noted.  No global or focal atrophy, no fasciculations in the proximal and distal upper extremities and lower extremities below the knees inspected.  Good muscle profile.  There is no obvious action or resting tremor.  Fine motor skills and coordination: grossly intact.  Cerebellar testing: No dysmetria or intention tremor. There is no truncal or gait ataxia.  Sensory exam: intact to light touch, temperature, pinprick and vibration sense in the upper and lower extremities.  Romberg is negative, very slight sway.   Reflexes are 1+ in the upper extremities and absent in the lower extremities.  Toes are downgoing bilaterally. Gait, station and balance: He stands easily. No veering to one side is noted. No leaning to one side is noted. Posture is age-appropriate for the most part, and stance is narrow based. Gait shows normal stride length and normal pace. No problems turning are noted.  Left shoulder higher than right, possible mild scoliosis.  Assessment and Plan:  In summary, MUSAAB GEDDIS is a very pleasant 72 y.o.-year old male with an underlying medical history of sarcoidosis, gout, chondromalacia, polymyalgia rheumatica, hypertension, chronic kidney disease, NASH, vitamin D deficiency, allergies, arthritis, reflux disease, hypothyroidism, obstructive sleep apnea on AutoPap therapy, and mild obesity, who presents for evaluation of his elevated CK levels within the past year.  He has a remote history of elevated CK  in 2010.  He reports a history of musculoskeletal pain which has been diffuse, lately a little better.  Examination is nonfocal, in particular, he has no weakness, no atrophy, no fasciculations, but diminished reflexes.  We will proceed with EMG and nerve conduction velocity testing through our office.  In addition, I would like to recheck some of his labs including CK and aldolase and vitamin B12, TSH, B1 and B6.  We will call him with his test results and follow-up accordingly.  He is advised to follow-up as scheduled next year for his sleep apnea.  I answered all his questions today and he was in agreement with our plan.  Thank you very much for allowing me to participate in the care of this nice patient. If I can be of any further assistance to you please do not hesitate to call me at [phone].  Sincerely,   Philip Foley, MD, PhD  I spent 40 minutes in total face-to-face time and in reviewing records during pre-charting, more than 50% of which was spent in counseling and coordination of care, reviewing test results, reviewing medications and treatment regimen and/or in discussing or reviewing the diagnosis of aching pain, elevated CK, the prognosis and treatment options. Pertinent laboratory and imaging test results that were available during this visit with the patient were reviewed by me and considered in my medical decision making (see chart for details).

## 2022-08-12 LAB — VITAMIN B1

## 2022-08-12 LAB — TSH: TSH: 1.31 u[IU]/mL (ref 0.450–4.500)

## 2022-08-15 ENCOUNTER — Ambulatory Visit: Payer: Medicare HMO | Admitting: Emergency Medicine

## 2022-08-16 LAB — ALDOLASE: Aldolase: 7.1 U/L (ref 3.3–10.3)

## 2022-08-16 LAB — B12 AND FOLATE PANEL: Vitamin B-12: 1225 pg/mL (ref 232–1245)

## 2022-08-17 ENCOUNTER — Ambulatory Visit: Payer: Self-pay | Admitting: Neurology

## 2022-08-17 ENCOUNTER — Ambulatory Visit (INDEPENDENT_AMBULATORY_CARE_PROVIDER_SITE_OTHER): Payer: Medicare HMO | Admitting: Neurology

## 2022-08-17 DIAGNOSIS — M5416 Radiculopathy, lumbar region: Secondary | ICD-10-CM | POA: Diagnosis not present

## 2022-08-17 DIAGNOSIS — R52 Pain, unspecified: Secondary | ICD-10-CM

## 2022-08-17 DIAGNOSIS — G5603 Carpal tunnel syndrome, bilateral upper limbs: Secondary | ICD-10-CM | POA: Diagnosis not present

## 2022-08-17 DIAGNOSIS — G4733 Obstructive sleep apnea (adult) (pediatric): Secondary | ICD-10-CM

## 2022-08-17 DIAGNOSIS — M7918 Myalgia, other site: Secondary | ICD-10-CM

## 2022-08-17 DIAGNOSIS — Z0289 Encounter for other administrative examinations: Secondary | ICD-10-CM

## 2022-08-17 DIAGNOSIS — G8929 Other chronic pain: Secondary | ICD-10-CM

## 2022-08-17 DIAGNOSIS — R748 Abnormal levels of other serum enzymes: Secondary | ICD-10-CM

## 2022-08-17 NOTE — Progress Notes (Unsigned)
Full Name: [facility] Gender: Male MRN #: [id number] Date of Birth: Aug 06, 1950    Visit Date: 08/17/2022 08:16 Age: 72 Years Examining Physician: Dr. [name] Referring Physician: Dr. Gareth Morgan Height: 5 feet 7 inch  History: 72 year old patient here for emg/ncs for subjective weakness throughout his body. No numbness or tingling. Pain in the muscles throughout moreso proximal muscles. Gotten better since being treated. Was diagnosed wih PMR. Stopped the prednisone after about 6 month, he is stable not worsening, but still weak, has to roll out of bed, no problems holding up arms or going up stairs. Mostly problems getting out of bed in the morning has roll out but the rest of the day he is ok, once he gets up and starts moving he is fine. Chronic low back pain, no radicular symptoms. But he did have radiculopathy in the late 80s in the right leg. Limited LE exam shows absent right AJ, 1+ left AJ, 1+ patellars, slight weakness right leg flexion. Recommend MRI lumbar spine. Low back pain. In the 80s had LBP and right radiculopathy.   Summary:  NCS performed on the right upper extremities and right lower extremity:  EMG needle study performed on the right upper and right lower extremity:     Conclusion:     ------------------------------- Physician Name, M.D.  [facility] [address], [address] [place], [place] [address] Tel: [phone] Fax: [phone]  Verbal informed consent was obtained from the patient, patient was informed of potential risk of procedure, including bruising, bleeding, hematoma formation, infection, muscle weakness, muscle pain, numbness, among others.        MNC    Nerve / Sites Muscle Latency Ref. Amplitude Ref. Rel Amp Segments Distance Velocity Ref. Area    ms ms mV mV %  cm m/s m/s mVms  R Median - APB     Wrist APB 4.2 ?4.4 8.3 ?4.0 100 Wrist - APB 7   33.0     Upper arm APB 9.4  8.2  98.9 Upper arm - Wrist 26 51 ?49  33.2  L Median - APB     Wrist APB 4.1 ?4.4 4.8 ?4.0 100 Wrist - APB 7   21.0     Upper arm APB 9.4  4.1  84.6 Upper arm - Wrist 28 53 ?49 18.6  R Ulnar - ADM     Wrist ADM 2.6 ?3.3 8.1 ?6.0 100 Wrist - ADM 7   28.6     B.Elbow ADM 5.5  8.5  104 B.Elbow - Wrist 16 55 ?49 29.0     A.Elbow ADM 8.7  8.2  96.4 A.Elbow - B.Elbow 18 56 ?49 28.1  L Ulnar - ADM     Wrist ADM 3.1 ?3.3 8.3 ?6.0 100 Wrist - ADM 7   30.5     B.Elbow ADM 5.7  8.4  101 B.Elbow - Wrist 12 46 ?49 29.5     A.Elbow ADM 8.8  7.1  83.8 A.Elbow - B.Elbow 18 58 ?49 28.3  R Peroneal - EDB     Ankle EDB 6.0 ?6.5 2.3 ?2.0 100 Ankle - EDB 9   7.4     Fib head EDB 12.1  2.2  96.6 Fib head - Ankle 27 44 ?44 8.0     Pop fossa EDB 14.8  1.9  86.7 Pop fossa - Fib head 12 44 ?44 7.6         Pop fossa - Ankle  R Tibial - AH     Ankle AH 7.2 ?5.8 1.9 ?4.0 100 Ankle - AH 9   6.6     Pop fossa AH 15.8  1.6  82 Pop fossa - Ankle 35 41 ?41 7.1                 SNC    Nerve / Sites Rec. Site Peak Lat Ref.  Amp Ref. Segments Distance Peak Diff Ref.    ms ms V V  cm ms ms  R Sural - Ankle (Calf) (1)     Calf Ankle 3.1 ?4.4 9 ?6 Calf - Ankle 14    R Superficial peroneal - Ankle     Lat leg Ankle 3.4 ?4.4 7 ?6 Lat leg - Ankle 14    R Median, Ulnar - Transcarpal comparison     Median Palm Wrist 2.7 ?2.2 46 ?35 Median Palm - Wrist 8       Ulnar Palm Wrist 1.9 ?2.2 13 ?12 Ulnar Palm - Wrist 8          Median Palm - Ulnar Palm  0.9 ?0.4  L Median, Ulnar - Transcarpal comparison     Median Palm Wrist 2.5 ?2.2 40 ?35 Median Palm - Wrist 8       Ulnar Palm Wrist 2.1 ?2.2 10 ?12 Ulnar Palm - Wrist 8          Median Palm - Ulnar Palm  0.3 ?0.4  R Median - Orthodromic (Dig II, Mid palm)     Dig II Wrist 3.9 ?3.4 9 ?10 Dig II - Wrist 13    L Median - Orthodromic (Dig II, Mid palm)     Dig II Wrist 4.3 ?3.4 11 ?10 Dig II - Wrist 13    R Ulnar - Orthodromic, (Dig V, Mid palm)     Dig V Wrist 3.1 ?3.1 7 ?5 Dig V - Wrist 11    L Ulnar -  Orthodromic, (Dig V, Mid palm)     Dig V Wrist 3.7 ?3.1 8 ?5 Dig V - Wrist 24                       F  Wave    Nerve F Lat Ref.   ms ms  R Ulnar - ADM 28.3 ?32.0  R Tibial - AH 59.7 ?56.0         H Reflex    Nerve H Lat Lat Hmax   ms ms   Left Right Ref. Left Right Ref.  Tibial - Soleus 33.1 NR ?35.0 20.3 NR ?35.0         EMG Summary Table    Spontaneous MUAP Recruitment  Muscle IA Fib PSW Fasc Other Amp Dur. Poly Pattern  R. Lumbar paraspinals (low) Normal None 3+ None _______ Normal Normal Normal Normal  R. Iliopsoas Normal None None None _______ Normal Normal Normal Normal  R. Vastus lateralis Normal None None None _______ Normal Normal Normal Normal  R. Vastus medialis Normal None None None _______ Normal Normal Normal Normal  R. Tibialis anterior Normal None None None _______ Normal Normal Normal Normal  R. Gastrocnemius (Medial head) Normal None 3+ None _______ Normal Increased Normal Reduced  R. Extensor hallucis longus Normal None 1+ None _______ Normal Increased Normal Reduced  R. Abductor hallucis Normal None None None _______ Normal Normal Normal Normal  R. Thoracic paraspinals (mid) Normal None None None _______ Normal Normal Normal Normal  R. Biceps  femoris (long head) Normal 3+ 3+ None _______ Normal Normal Normal Normal  R. Gluteus maximus Normal None None None _______ Normal Normal Normal Normal  R. Gluteus medius Normal None None None _______ Normal Normal Normal Normal  R. Cervical paraspinals (low) Normal None None None _______ Normal Normal Normal Normal  R. Deltoid Normal None None None _______ Normal Normal Normal Normal  R. Triceps brachii Normal None None None _______ Normal Normal Normal Normal  R. Biceps brachii Normal None None None _______ Normal Normal Normal Normal  R. Pronator teres Normal None None None _______ Normal Normal Normal Normal  R. Extensor indicis proprius Normal None None None _______ Normal Normal Normal Normal  R. First dorsal  interosseous Normal None None None _______ Normal Normal Normal Normal  R. Opponens pollicis Normal None None None _______ Normal Normal Normal Normal  L. Lumbar paraspinals (low) Normal None None None _______ Normal Normal Normal Normal

## 2022-08-18 LAB — MULTIPLE MYELOMA PANEL, SERUM
Albumin SerPl Elph-Mcnc: 3.7 g/dL (ref 2.9–4.4)
Albumin/Glob SerPl: 1.3 (ref 0.7–1.7)
Alpha 1: 0.3 g/dL (ref 0.0–0.4)
Alpha2 Glob SerPl Elph-Mcnc: 0.6 g/dL (ref 0.4–1.0)
B-Globulin SerPl Elph-Mcnc: 1.1 g/dL (ref 0.7–1.3)
Gamma Glob SerPl Elph-Mcnc: 1 g/dL (ref 0.4–1.8)
Globulin, Total: 3 g/dL (ref 2.2–3.9)
IgA/Immunoglobulin A, Serum: 312 mg/dL (ref 61–437)
IgG (Immunoglobin G), Serum: 1075 mg/dL (ref 603–1613)
IgM (Immunoglobulin M), Srm: 83 mg/dL (ref 20–172)
M Protein SerPl Elph-Mcnc: 0.5 g/dL — ABNORMAL HIGH
Total Protein: 6.7 g/dL (ref 6.0–8.5)

## 2022-08-18 LAB — VITAMIN B6: Vitamin B6: 28.9 ug/L (ref 3.4–65.2)

## 2022-08-18 LAB — B12 AND FOLATE PANEL: Folate: 19.6 ng/mL (ref >3.0)

## 2022-08-18 LAB — AMMONIA: Ammonia: 34 ug/dL — ABNORMAL LOW (ref 40–200)

## 2022-08-18 LAB — CK: Total CK: 428 U/L — ABNORMAL HIGH (ref 41–331)

## 2022-08-18 LAB — HGB A1C W/O EAG: Hgb A1c MFr Bld: 5.6 % (ref 4.8–5.6)

## 2022-08-18 LAB — VITAMIN D 25 HYDROXY (VIT D DEFICIENCY, FRACTURES): Vit D, 25-Hydroxy: 67.4 ng/mL (ref 30.0–100.0)

## 2022-08-21 DIAGNOSIS — M5416 Radiculopathy, lumbar region: Secondary | ICD-10-CM | POA: Insufficient documentation

## 2022-08-21 DIAGNOSIS — G5603 Carpal tunnel syndrome, bilateral upper limbs: Secondary | ICD-10-CM | POA: Insufficient documentation

## 2022-08-21 NOTE — Addendum Note (Signed)
Addended by: [name] on: 08/21/2022 05:18 PM   Modules accepted: Orders

## 2022-08-21 NOTE — Procedures (Signed)
Full Name: Philip Woodard Gender: Male MRN #: [id number] Date of Birth: Sep 28, 1950    Visit Date: 08/17/2022 08:16 Age: 72 Years Examining Physician: Dr. Naomie Woodard Referring Physician: Dr. Gareth Morgan Height: 5 feet 7 inch  History: 72 year old patient here for emg/ncs for subjective weakness throughout his body. No numbness or tingling. Pain in the muscles throughout moreso proximal muscles. Gotten better since being treated. Was diagnosed wih PMR. Stopped the prednisone after about 6 month, he is stable not worsening, but still weak, has to roll out of bed, no problems holding up arms or going up stairs. Mostly problems getting out of bed in the morning has roll out but the rest of the day he is ok, once he gets up and starts moving he is fine. Chronic low back pain, no radicular symptoms. But he did have radiculopathy in the late 80s in the right leg. Limited LE exam shows absent right AJ, 1+ left AJ, 1+ patellars, slight weakness right leg flexion. Recommend MRI lumbar spine. Low back pain. In the 80s had LBP and right radiculopathy.   Summary:  NCS performed on the bilateral upper extremities and right lower extremity: The right Tibial Motor nerve showed delayed distal onset latency(7.23ms, N<5.8) and reduced amplitude(1.9,N>4). The right Median orthodromic sensory nerve showed delayed distal peak latency(3.39ms, N<3.4) and reduced amplitude(9, N>10). The left  Median orthodromic sensory nerve showed delayed distal peak latency(4.32ms, N<3.4). The right median/ulnar (palm) comparison nerve showed prolonged distal peak latency (Median Palm, 2.9 ms, N<2.2) and abnormal peak latency difference (Median Palm-Ulnar Palm, 0.9 ms, N<0.4) with a relative median delay.  The left median/ulnar (palm) comparison nerve showed prolonged distal peak latency (Median Palm, 2.5 ms, N<2.2) and abnormal peak latency difference (Median Palm-Ulnar Palm, 0.4 ms, N<0.4) with a relative median delay.  The bilateral H  waves showed no response. The right tibial F wave showed delayed latency (59.70ms, N ,<62ms). All remaining nerves (as indicated in the following tables) were within normal limits.    EMG needle study performed on the right upper and right lower extremity: The right lumbar paraspinals showed spontaneous activity.  The right gastrocnemius showed spontaneous activity, prolonged motor unit duration, and diminished motor unit recruitment.  The right extensor houses longus showed spontaneous activity, prolonged motor unit duration and diminished motor unit recruitment.  The right biceps femoris showed increased spontaneous activity.  The left lumbar paraspinals were within normal limits. .All remaining muscles (as indicated in the following tables) were within normal limits.       Conclusion: There is acute/ongoing denervation in the right lower lumbar paraspinals as well as proximal and distal acute/ongoing denervation and chronic neurogenic changes in right-sided muscles that share L5/S1 innervation consistent with radiculopathy. Recommend MRI lumbar spine as clinically warranted.  There is concomitant bilateral mild carpal tunnel syndrome.   ------------------------------- Philip Woodard, M.D.  [facility] [address] [place], [place] [address] Tel: [phone] Fax: [phone]  Verbal informed consent was obtained from the patient, patient was informed of potential risk of procedure, including bruising, bleeding, hematoma formation, infection, muscle weakness, muscle pain, numbness, among others.        MNC    Nerve / Sites Muscle Latency Ref. Amplitude Ref. Rel Amp Segments Distance Velocity Ref. Area    ms ms mV mV %  cm m/s m/s mVms  R Median - APB     Wrist APB 4.2 ?4.4 8.3 ?4.0 100 Wrist -  APB 7   33.0     Upper arm APB 9.4  8.2  98.9 Upper arm - Wrist 26 51 ?49 33.2  L Median - APB     Wrist APB 4.1 ?4.4 4.8 ?4.0 100 Wrist - APB 7   21.0     Upper arm  APB 9.4  4.1  84.6 Upper arm - Wrist 28 53 ?49 18.6  R Ulnar - ADM     Wrist ADM 2.6 ?3.3 8.1 ?6.0 100 Wrist - ADM 7   28.6     B.Elbow ADM 5.5  8.5  104 B.Elbow - Wrist 16 55 ?49 29.0     A.Elbow ADM 8.7  8.2  96.4 A.Elbow - B.Elbow 18 56 ?49 28.1  L Ulnar - ADM     Wrist ADM 3.1 ?3.3 8.3 ?6.0 100 Wrist - ADM 7   30.5     B.Elbow ADM 5.7  8.4  101 B.Elbow - Wrist 12 46 ?49 29.5     A.Elbow ADM 8.8  7.1  83.8 A.Elbow - B.Elbow 18 58 ?49 28.3  R Peroneal - EDB     Ankle EDB 6.0 ?6.5 2.3 ?2.0 100 Ankle - EDB 9   7.4     Fib head EDB 12.1  2.2  96.6 Fib head - Ankle 27 44 ?44 8.0     Pop fossa EDB 14.8  1.9  86.7 Pop fossa - Fib head 12 44 ?44 7.6         Pop fossa - Ankle      R Tibial - AH     Ankle AH 7.2 ?5.8 1.9 ?4.0 100 Ankle - AH 9   6.6     Pop fossa AH 15.8  1.6  82 Pop fossa - Ankle 35 41 ?41 7.1                 SNC    Nerve / Sites Rec. Site Peak Lat Ref.  Amp Ref. Segments Distance Peak Diff Ref.    ms ms V V  cm ms ms  R Sural - Ankle (Calf) (1)     Calf Ankle 3.1 ?4.4 9 ?6 Calf - Ankle 14    R Superficial peroneal - Ankle     Lat leg Ankle 3.4 ?4.4 7 ?6 Lat leg - Ankle 14    R Median, Ulnar - Transcarpal comparison     Median Palm Wrist 2.7 ?2.2 46 ?35 Median Palm - Wrist 8       Ulnar Palm Wrist 1.9 ?2.2 13 ?12 Ulnar Palm - Wrist 8          Median Palm - Ulnar Palm  0.9 ?0.4  L Median, Ulnar - Transcarpal comparison     Median Palm Wrist 2.5 ?2.2 40 ?35 Median Palm - Wrist 8       Ulnar Palm Wrist 2.1 ?2.2 10 ?12 Ulnar Palm - Wrist 8          Median Palm - Ulnar Palm  0.4 ?0.4  R Median - Orthodromic (Dig II, Mid palm)     Dig II Wrist 3.9 ?3.4 9 ?10 Dig II - Wrist 13    L Median - Orthodromic (Dig II, Mid palm)     Dig II Wrist 4.3 ?3.4 11 ?10 Dig II - Wrist 13    R Ulnar - Orthodromic, (Dig V, Mid palm)     Dig V Wrist 3.1 ?3.1 7 ?5 Dig V - Wrist 11  L Ulnar - Orthodromic, (Dig V, Mid palm)     Dig V Wrist 3.1 ?3.1 8 ?5 Dig V - Wrist 58                        F  Wave    Nerve F Lat Ref.   ms ms  R Ulnar - ADM 28.3 ?32.0  R Tibial - AH 59.7 ?56.0         H Reflex    Nerve H Lat Lat Hmax   ms ms   Left Right Ref. Left Right Ref.  Tibial - S[place]us 33.1 NR ?35.0 20.3 NR ?35.0         EMG Summary Table    Spontaneous MUAP Recruitment  Muscle IA Fib PSW Fasc Other Amp Dur. Poly Pattern  R. Lumbar paraspinals (low) Normal None 3+ None _______ Normal Normal Normal Normal  R. Iliopsoas Normal None None None _______ Normal Normal Normal Normal  R. Vastus lateralis Normal None None None _______ Normal Normal Normal Normal  R. Vastus medialis Normal None None None _______ Normal Normal Normal Normal  R. Tibialis anterior Normal None None None _______ Normal Normal Normal Normal  R. Gastrocnemius (Medial head) Normal None 3+ None _______ Normal Increased Normal Reduced  R. Extensor hallucis longus Normal None 1+ None _______ Normal Increased Normal Reduced  R. Abductor hallucis Normal None None None _______ Normal Normal Normal Normal  R. Thoracic paraspinals (mid) Normal None None None _______ Normal Normal Normal Normal  R. Biceps femoris (long head) Normal 3+ 3+ None _______ Normal Normal Normal Normal  R. Gluteus maximus Normal None None None _______ Normal Normal Normal Normal  R. Gluteus medius Normal None None None _______ Normal Normal Normal Normal  R. Cervical paraspinals (low) Normal None None None _______ Normal Normal Normal Normal  R. Deltoid Normal None None None _______ Normal Normal Normal Normal  R. Triceps brachii Normal None None None _______ Normal Normal Normal Normal  R. Biceps brachii Normal None None None _______ Normal Normal Normal Normal  R. Pronator teres Normal None None None _______ Normal Normal Normal Normal  R. Extensor indicis proprius Normal None None None _______ Normal Normal Normal Normal  R. First dorsal interosseous Normal None None None _______ Normal Normal Normal Normal  R. Opponens pollicis Normal None  None None _______ Normal Normal Normal Normal  L. Lumbar paraspinals (low) Normal None None None _______ Normal Normal Normal Normal

## 2022-08-22 ENCOUNTER — Telehealth: Payer: Self-pay | Admitting: Neurology

## 2022-08-22 NOTE — Telephone Encounter (Signed)
MR lumbar wo contrast sent to GI for scheduling, they obtain [employer] auth. [id number]

## 2022-08-23 ENCOUNTER — Encounter: Payer: Self-pay | Admitting: Neurology

## 2022-08-23 ENCOUNTER — Telehealth: Payer: Self-pay

## 2022-08-23 DIAGNOSIS — M5416 Radiculopathy, lumbar region: Secondary | ICD-10-CM | POA: Diagnosis not present

## 2022-08-23 DIAGNOSIS — M5136 Other intervertebral disc degeneration, lumbar region: Secondary | ICD-10-CM | POA: Diagnosis not present

## 2022-08-23 DIAGNOSIS — M9903 Segmental and somatic dysfunction of lumbar region: Secondary | ICD-10-CM | POA: Diagnosis not present

## 2022-08-23 NOTE — Telephone Encounter (Signed)
No answer, left message to return call to office, 1st attempt

## 2022-08-23 NOTE — Telephone Encounter (Signed)
-----   Message from [name], MD sent at 08/21/2022  5:18 PM EDT ----- Please advise patient that his recent EMG and nerve conduction velocity test from last week did not show any widespread muscle disease or neuropathy/nerve damage.  He has evidence of mild carpal tunnel, if he has numbness in his hands especially at night, he may benefit from using an over the counter wrist splint at night.  He also has findings that would support a pinched nerve in the lower back on the right side.  Since she does have a history of low back pain, I would recommend an MRI of the lumbar spine.  He may benefit from seeing a spine specialist in the future.  If he is agreeable, would like to go ahead and order a lumbar spine MRI without contrast.  His creatinine level was mildly elevated at the last check in March, probably safest to do the lumbar spine MRI without contrast at this point.

## 2022-08-24 DIAGNOSIS — M5416 Radiculopathy, lumbar region: Secondary | ICD-10-CM

## 2022-08-24 DIAGNOSIS — R748 Abnormal levels of other serum enzymes: Secondary | ICD-10-CM

## 2022-08-24 DIAGNOSIS — D472 Monoclonal gammopathy: Secondary | ICD-10-CM

## 2022-08-27 ENCOUNTER — Ambulatory Visit
Admission: RE | Admit: 2022-08-27 | Discharge: 2022-08-27 | Disposition: A | Discharge status: Home / Self Care | Payer: Medicare HMO | Source: Ambulatory Visit | Attending: Neurology | Admitting: Neurology

## 2022-08-27 DIAGNOSIS — G4733 Obstructive sleep apnea (adult) (pediatric): Secondary | ICD-10-CM | POA: Diagnosis not present

## 2022-08-27 DIAGNOSIS — G8929 Other chronic pain: Secondary | ICD-10-CM

## 2022-08-27 DIAGNOSIS — R52 Pain, unspecified: Secondary | ICD-10-CM | POA: Diagnosis not present

## 2022-08-27 DIAGNOSIS — M545 Low back pain, unspecified: Secondary | ICD-10-CM

## 2022-08-27 DIAGNOSIS — R748 Abnormal levels of other serum enzymes: Secondary | ICD-10-CM

## 2022-08-27 DIAGNOSIS — M7918 Myalgia, other site: Secondary | ICD-10-CM

## 2022-08-29 ENCOUNTER — Telehealth: Payer: Self-pay

## 2022-08-29 NOTE — Telephone Encounter (Signed)
-----   Message from Saima Athar, MD sent at 08/21/2022  5:18 PM EDT ----- Please advise patient that his recent EMG and nerve conduction velocity test from last week did not show any widespread muscle disease or neuropathy/nerve damage.  He has evidence of mild carpal tunnel, if he has numbness in his hands especially at night, he may benefit from using an over the counter wrist splint at night.  He also has findings that would support a pinched nerve in the lower back on the right side.  Since she does have a history of low back pain, I would recommend an MRI of the lumbar spine.  He may benefit from seeing a spine specialist in the future.  If he is agreeable, would like to go ahead and order a lumbar spine MRI without contrast.  His creatinine level was mildly elevated at the last check in March, probably safest to do the lumbar spine MRI without contrast at this point.  

## 2022-08-29 NOTE — Telephone Encounter (Signed)
Multiple attempts have been made to contact the patient. Patient is active in [facility]. Will send message.

## 2022-08-29 NOTE — Telephone Encounter (Signed)
Referral to hematology placed.

## 2022-08-30 ENCOUNTER — Telehealth: Payer: Self-pay

## 2022-08-30 NOTE — Telephone Encounter (Signed)
We have made multiple attempts to contact the patient in regards to his MRI and nerve conduction study results. He has not answered or returned our calls. I have printed and placed results in outgoing mail.

## 2022-08-30 NOTE — Telephone Encounter (Signed)
-----   Message from [name], MD sent at 08/29/2022  4:46 PM EDT ----- Please call patient and advise him that his recent MRI of the lumbar spine shows degenerative changes particularly at level L4-5; overall he has multilevel degenerative changes, also evidence of scoliosis. Nothing that requires immediate surgical intervention but he can consider seeing a spine specialist.  If he  would like a referral to orthopedics, we can facilitate and put a referral in, otherwise he can also discuss with primary care.

## 2022-08-31 ENCOUNTER — Encounter: Payer: Self-pay | Admitting: Emergency Medicine

## 2022-08-31 ENCOUNTER — Telehealth: Payer: Self-pay | Admitting: Neurology

## 2022-08-31 ENCOUNTER — Ambulatory Visit (INDEPENDENT_AMBULATORY_CARE_PROVIDER_SITE_OTHER): Payer: Medicare HMO | Admitting: Emergency Medicine

## 2022-08-31 ENCOUNTER — Other Ambulatory Visit: Payer: Self-pay | Admitting: *Deleted

## 2022-08-31 VITALS — BP 138/82 | HR 68 | Temp 98.2°F | Ht 67.0 in | Wt 202.4 lb

## 2022-08-31 DIAGNOSIS — R399 Unspecified symptoms and signs involving the genitourinary system: Secondary | ICD-10-CM

## 2022-08-31 DIAGNOSIS — M51379 Other intervertebral disc degeneration, lumbosacral region without mention of lumbar back pain or lower extremity pain: Secondary | ICD-10-CM

## 2022-08-31 DIAGNOSIS — M5416 Radiculopathy, lumbar region: Secondary | ICD-10-CM

## 2022-08-31 DIAGNOSIS — G4733 Obstructive sleep apnea (adult) (pediatric): Secondary | ICD-10-CM

## 2022-08-31 DIAGNOSIS — K7581 Nonalcoholic steatohepatitis (NASH): Secondary | ICD-10-CM

## 2022-08-31 DIAGNOSIS — I10 Essential (primary) hypertension: Secondary | ICD-10-CM

## 2022-08-31 DIAGNOSIS — M5137 Other intervertebral disc degeneration, lumbosacral region: Secondary | ICD-10-CM

## 2022-08-31 DIAGNOSIS — E890 Postprocedural hypothyroidism: Secondary | ICD-10-CM | POA: Diagnosis not present

## 2022-08-31 DIAGNOSIS — N1831 Chronic kidney disease, stage 3a: Secondary | ICD-10-CM | POA: Diagnosis not present

## 2022-08-31 DIAGNOSIS — D869 Sarcoidosis, unspecified: Secondary | ICD-10-CM

## 2022-08-31 MED ORDER — ACYCLOVIR 800 MG PO TABS: 800.0000 mg | ORAL_TABLET | Freq: Every day | ORAL | 0 refills | Status: AC

## 2022-08-31 MED ORDER — GABAPENTIN 300 MG PO CAPS: 300.0000 mg | ORAL_CAPSULE | Freq: Three times a day (TID) | ORAL | 1 refills | Status: DC

## 2022-08-31 MED ORDER — TAMSULOSIN HCL 0.4 MG PO CAPS: 0.4000 mg | ORAL_CAPSULE | Freq: Every day | ORAL | 1 refills | Status: DC

## 2022-08-31 MED ORDER — HYDROXYCHLOROQUINE SULFATE 200 MG PO TABS: 200.0000 mg | ORAL_TABLET | Freq: Two times a day (BID) | ORAL | 1 refills | Status: DC

## 2022-08-31 NOTE — Telephone Encounter (Signed)
Referral sent to [facility]: Phone: [phone] Fax: [phone]

## 2022-08-31 NOTE — Telephone Encounter (Signed)
Pt called back MRI results relayed:   ----- Message from [name], MD sent at 08/29/2022  4:46 PM EDT ----- Please call patient and advise him that his recent MRI of the lumbar spine shows degenerative changes particularly at level L4-5; overall he has multilevel degenerative changes, also evidence of scoliosis. Nothing that requires immediate surgical intervention but he can consider seeing a spine specialist.  If he  would like a referral to orthopedics, we can facilitate and put a referral in, otherwise he can also discuss with primary care.    Pt would like referral to ortho.  He has not seen anyone relating to that specialty before.

## 2022-08-31 NOTE — Assessment & Plan Note (Signed)
Clinically euthyroid. Normal recent TSH Continue Synthroid 200 mcg daily

## 2022-08-31 NOTE — Assessment & Plan Note (Signed)
Stable and asymptomatic  

## 2022-08-31 NOTE — Telephone Encounter (Signed)
We will put in referral to orthopedics.

## 2022-08-31 NOTE — Assessment & Plan Note (Signed)
Stable condition. Advised to stay well-hydrated and avoid NSAIDs

## 2022-08-31 NOTE — Assessment & Plan Note (Signed)
Stable.  Diet and nutrition discussed. 

## 2022-08-31 NOTE — Patient Instructions (Signed)
Hypertension, Adult High blood pressure (hypertension) is when the force of blood pumping through the arteries is too strong. The arteries are the blood vessels that carry blood from the heart throughout the body. Hypertension forces the heart to work harder to pump blood and may cause arteries to become narrow or stiff. Untreated or uncontrolled hypertension can lead to a heart attack, heart failure, a stroke, kidney disease, and other problems. A blood pressure reading consists of a higher number over a lower number. Ideally, your blood pressure should be below 120/80. The first ("top") number is called the systolic pressure. It is a measure of the pressure in your arteries as your heart beats. The second ("bottom") number is called the diastolic pressure. It is a measure of the pressure in your arteries as the heart relaxes. What are the causes? The exact cause of this condition is not known. There are some conditions that result in high blood pressure. What increases the risk? Certain factors may make you more likely to develop high blood pressure. Some of these risk factors are under your control, including: Smoking. Not getting enough exercise or physical activity. Being overweight. Having too much fat, sugar, calories, or salt (sodium) in your diet. Drinking too much alcohol. Other risk factors include: Having a personal history of heart disease, diabetes, high cholesterol, or kidney disease. Stress. Having a family history of high blood pressure and high cholesterol. Having obstructive sleep apnea. Age. The risk increases with age. What are the signs or symptoms? High blood pressure may not cause symptoms. Very high blood pressure (hypertensive crisis) may cause: Headache. Fast or irregular heartbeats (palpitations). Shortness of breath. Nosebleed. Nausea and vomiting. Vision changes. Severe chest pain, dizziness, and seizures. How is this diagnosed? This condition is diagnosed by  measuring your blood pressure while you are seated, with your arm resting on a flat surface, your legs uncrossed, and your feet flat on the floor. The cuff of the blood pressure monitor will be placed directly against the skin of your upper arm at the level of your heart. Blood pressure should be measured at least twice using the same arm. Certain conditions can cause a difference in blood pressure between your right and left arms. If you have a high blood pressure reading during one visit or you have normal blood pressure with other risk factors, you may be asked to: Return on a different day to have your blood pressure checked again. Monitor your blood pressure at home for 1 week or longer. If you are diagnosed with hypertension, you may have other blood or imaging tests to help your health care provider understand your overall risk for other conditions. How is this treated? This condition is treated by making healthy lifestyle changes, such as eating healthy foods, exercising more, and reducing your alcohol intake. You may be referred for counseling on a healthy diet and physical activity. Your health care provider may prescribe medicine if lifestyle changes are not enough to get your blood pressure under control and if: Your systolic blood pressure is above 130. Your diastolic blood pressure is above 80. Your personal target blood pressure may vary depending on your medical conditions, your age, and other factors. Follow these instructions at home: Eating and drinking  Eat a diet that is high in fiber and potassium, and low in sodium, added sugar, and fat. An example of this eating plan is called the DASH diet. DASH stands for Dietary Approaches to Stop Hypertension. To eat this way: Eat   plenty of fresh fruits and vegetables. Try to fill one half of your plate at each meal with fruits and vegetables. Eat whole grains, such as whole-wheat pasta, brown rice, or whole-grain bread. Fill about one  fourth of your plate with whole grains. Eat or drink low-fat dairy products, such as skim milk or low-fat yogurt. Avoid fatty cuts of meat, processed or cured meats, and poultry with skin. Fill about one fourth of your plate with lean proteins, such as fish, chicken without skin, beans, eggs, or tofu. Avoid pre-made and processed foods. These tend to be higher in sodium, added sugar, and fat. Reduce your daily sodium intake. Many people with hypertension should eat less than 1,500 mg of sodium a day. Do not drink alcohol if: Your health care provider tells you not to drink. You are pregnant, may be pregnant, or are planning to become pregnant. If you drink alcohol: Limit how much you have to: 0-1 drink a day for women. 0-2 drinks a day for men. Know how much alcohol is in your drink. In the [place] one drink equals one 12 oz bottle of beer (355 mL), one 5 oz glass of wine (148 mL), or one 1 oz glass of hard liquor (44 mL). Lifestyle  Work with your health care provider to maintain a healthy body weight or to lose weight. Ask what an ideal weight is for you. Get at least 30 minutes of exercise that causes your heart to beat faster (aerobic exercise) most days of the week. Activities may include walking, swimming, or biking. Include exercise to strengthen your muscles (resistance exercise), such as Pilates or lifting weights, as part of your weekly exercise routine. Try to do these types of exercises for 30 minutes at least 3 days a week. Do not use any products that contain nicotine or tobacco. These products include cigarettes, chewing tobacco, and vaping devices, such as e-cigarettes. If you need help quitting, ask your health care provider. Monitor your blood pressure at home as told by your health care provider. Keep all follow-up visits. This is important. Medicines Take over-the-counter and prescription medicines only as told by your health care provider. Follow directions carefully. Blood  pressure medicines must be taken as prescribed. Do not skip doses of blood pressure medicine. Doing this puts you at risk for problems and can make the medicine less effective. Ask your health care provider about side effects or reactions to medicines that you should watch for. Contact a health care provider if you: Think you are having a reaction to a medicine you are taking. Have headaches that keep coming back (recurring). Feel dizzy. Have swelling in your ankles. Have trouble with your vision. Get help right away if you: Develop a severe headache or confusion. Have unusual weakness or numbness. Feel faint. Have severe pain in your chest or abdomen. Vomit repeatedly. Have trouble breathing. These symptoms may be an emergency. Get help right away. Call 911. Do not wait to see if the symptoms will go away. Do not drive yourself to the hospital. Summary Hypertension is when the force of blood pumping through your arteries is too strong. If this condition is not controlled, it may put you at risk for serious complications. Your personal target blood pressure may vary depending on your medical conditions, your age, and other factors. For most people, a normal blood pressure is less than 120/80. Hypertension is treated with lifestyle changes, medicines, or a combination of both. Lifestyle changes include losing weight, eating a healthy,   low-sodium diet, exercising more, and limiting alcohol. This information is not intended to replace advice given to you by your health care provider. Make sure you discuss any questions you have with your health care provider. Document Revised: 01/04/2021 Document Reviewed: 01/04/2021 Elsevier Patient Education  2024 [employer]  

## 2022-08-31 NOTE — Assessment & Plan Note (Signed)
Well-controlled hypertension. Continue amlodipine 10 mg and valsartan 160 mg daily Cardiovascular risks associated with hypertension discussed Diet and nutrition discussed.

## 2022-08-31 NOTE — Progress Notes (Signed)
Philip Woodard 73 y.o.   Chief Complaint  Patient presents with   Medical Management of Chronic Issues    f/u appt, no concerns     HISTORY OF PRESENT ILLNESS: This is a 72 y.o. male here for 8-month follow-up of chronic medical problems including hypertension Overall the well.  Has no complaints or medical concerns today. BP Readings from Last 3 Encounters:  08/31/22 138/82  08/17/22 (!) 145/75  08/10/22 131/69   Wt Readings from Last 3 Encounters:  08/31/22 202 lb 6 oz (91.8 kg)  08/17/22 208 lb (94.3 kg)  08/10/22 208 lb (94.3 kg)     HPI   Prior to Admission medications   Medication Sig Start Date End Date Taking? Authorizing Provider  acetaminophen (TYLENOL) 650 MG CR tablet Take 650 mg by mouth every 8 (eight) hours as needed.   Yes [provider]  acyclovir (ZOVIRAX) 800 MG tablet TAKE 1 TABLET BY MOUTH EVERY DAY 05/29/22  Yes Georgina Quint, MD  amLODipine (NORVASC) 10 MG tablet TAKE 1 TABLET BY MOUTH EVERY DAY 04/28/22  Yes Ellyn Rubiano, Eilleen Kempf, MD  cholecalciferol (VITAMIN D3) 25 MCG (1000 UT) tablet Take 1,000 Units by mouth daily.   Yes [provider]  gabapentin (NEURONTIN) 300 MG capsule Take 1 capsule (300 mg total) by mouth 3 (three) times daily. 05/18/22  Yes Myra Rude, MD  hydroxychloroquine (PLAQUENIL) 200 MG tablet Take by mouth 2 (two) times daily.   Yes [provider]  levothyroxine (SYNTHROID) 200 MCG tablet Take 1 tablet (200 mcg total) by mouth daily. 10/04/21  Yes Coletta Lockner, Eilleen Kempf, MD  Multiple Vitamin (MULTIVITAMIN) LIQD Take 5 mLs by mouth daily.   Yes [provider]  Omega-3 Fatty Acids (FISH OIL) 1000 MG CAPS Take 2 capsules by mouth daily.   Yes [provider]  pantoprazole (PROTONIX) 40 MG tablet Take 1 tablet (40 mg total) by mouth daily. 03/30/22  Yes Meryl Dare, MD  tamsulosin (FLOMAX) 0.4 MG CAPS capsule TAKE 1 CAPSULE BY MOUTH EVERY DAY 04/11/22  Yes Zaara Sprowl,  Eilleen Kempf, MD  Turmeric 500 MG TABS Take 1 tablet by mouth 2 (two) times daily.   Yes [provider]  valsartan (DIOVAN) 160 MG tablet TAKE 1 TABLET BY MOUTH EVERY DAY 03/12/22  Yes Danicka Hourihan, Eilleen Kempf, MD  zinc gluconate 50 MG tablet Take 50 mg by mouth daily.   Yes [provider]  Glucosamine-Chondroit-Vit C-Mn (GLUCOSAMINE 1500 COMPLEX) CAPS Take by mouth daily.    [provider]  sildenafil (REVATIO) 20 MG tablet Take 5 tablets as directed 30-45 minutes prior to intercourse Patient not taking: Reported on 08/31/2022 05/23/22   Georgina Quint, MD    Allergies  Allergen Reactions   Other     SEAFOOD   Shellfish Allergy Swelling    Tongue and facial Swelling Tongue and facial Swelling    Patient Active Problem List   Diagnosis Date Noted   Bilateral carpal tunnel syndrome 08/21/2022   NCGS (non-celiac gluten sensitivity) 01/25/2022   Internal hemorrhoids 01/25/2022   Cervical radiculopathy 10/17/2021   Lower urinary tract symptoms 07/20/2021   Chronic idiopathic gout involving toe of left foot without tophus 04/27/2021   Stage 3a chronic kidney disease (HCC) 03/31/2021   Erectile dysfunction due to arterial insufficiency 03/31/2021   NASH (nonalcoholic steatohepatitis) 02/22/2015   Arthritis of right lower extremity 07/20/2014   Hearing loss 02/19/2014   OSA on CPAP 05/02/2013   Hypothyroid 03/29/2012  Primary hypertension 11/24/2008   HYPOTHYROIDISM, POSTSURGICAL 07/07/2008   HYPERTROPHY PROSTATE W/UR OBST & OTH LUTS 07/07/2008   DISC DISEASE, LUMBAR 12/27/2007   Sarcoidosis 06/06/2007   GOITER, MULTINODULAR 06/06/2007   HEMORRHOIDS 06/06/2007   ASTHMA 06/06/2007    Past Medical History:  Diagnosis Date   Alkaline phosphatase raised 06/28/2011   Fluctuating - suspect due to sarcoidosis   Allergic rhinitis    Allergy    Arthritis    knees   Genital herpes    GERD (gastroesophageal reflux disease)    Hemorrhoids     Hyperglycemia    Hyperplastic lymph node 06/27/2011   Submental node excised 1/02; regrowth: re-excision 10/06   Hypertension    Hypothyroidism    Multinodular goiter    Rheumatoid arteritis (HCC)    Routine general medical examination at a health care facility    Sarcoidosis    Sleep apnea    wears cpap     Past Surgical History:  Procedure Laterality Date   COLONOSCOPY  5-23/2003   COLONOSCOPY  02/04/2018   TA   COLONOSCOPY WITH PROPOFOL  05/26/2021   [facility]aud[facility] Head at [facility]   POLYPECTOMY     ROTATOR CUFF REPAIR Bilateral [id number]   THYROIDECTOMY  2002    Social History   Socioeconomic History   Marital status: Married    Spouse name: Not on file   Number of children: 2   Years of education: Not on file   Highest education level: Not on file  Occupational History   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  Tobacco Use   Smoking status: Never    Passive exposure: Never   Smokeless tobacco: Never  Vaping Use   Vaping Use: Never used  Substance and Sexual Activity   Alcohol use: Yes    Comment: 3 times per month   Drug use: Never   Sexual activity: Not on file  Other Topics Concern   Not on file  Social History Narrative   Not on file   Social Determinants of Health   Financial Resource Strain: Low Risk  (04/25/2022)   Overall Financial Resource Strain (CARDIA)    Difficulty of Paying Living Expenses: Not hard at all  Food Insecurity: No Food Insecurity (04/04/2021)   Hunger Vital Sign    Worried About Running Out of Food in the Last Year: Never true    Ran Out of Food in the Last Year: Never true  Transportation Needs: No Transportation Needs (04/25/2022)   PRAPARE - Administrator, Civil Service (Medical): No    Lack of Transportation (Non-Medical): No  Physical Activity: Sufficiently Active (04/25/2022)   Exercise Vital Sign    Days of Exercise per Week: 5 days    Minutes of Exercise per Session: 60 min  Stress: No Stress Concern Present  (04/25/2022)   [employer] of Occupational Health - Occupational Stress Questionnaire    Feeling of Stress : Not at all  Social Connections: Moderately Integrated (04/25/2022)   Social Connection and Isolation Panel [NHANES]    Frequency of Communication with Friends and Family: Twice a week    Frequency of Social Gatherings with Friends and Family: Once a week    Attends Religious Services: More than 4 times per year    Active Member of [employer] or Organizations: No    Attends Banker Meetings: Never    Marital Status: Married  Catering manager Violence: Not At Risk (04/25/2022)   Humiliation, Afraid, Rape, and Kick questionnaire  Fear of Current or Ex-Partner: No    Emotionally Abused: No    Physically Abused: No    Sexually Abused: No    Family History  Problem Relation Age of Onset   Hypertension Mother    Dementia Father    Sleep apnea Brother    Healthy Brother    Sleep apnea Paternal Aunt    Asthma Cousin    Healthy Daughter    Healthy Daughter    Sarcoidosis Neg Hx    Colon polyps Neg Hx    Colon cancer Neg Hx    Esophageal cancer Neg Hx    Stomach cancer Neg Hx    Rectal cancer Neg Hx      Review of Systems  Constitutional: Negative.  Negative for chills and fever.  HENT: Negative.  Negative for congestion and sore throat.   Respiratory: Negative.  Negative for cough and shortness of breath.   Cardiovascular: Negative.  Negative for chest pain and palpitations.  Gastrointestinal:  Negative for abdominal pain, diarrhea, nausea and vomiting.  Genitourinary: Negative.  Negative for dysuria and hematuria.  Skin: Negative.  Negative for rash.  Neurological: Negative.  Negative for dizziness and headaches.    Vitals:   08/31/22 0937  BP: 138/82  Pulse: 68  Temp: 98.2 F (36.8 C)  SpO2: 97%    Physical Exam Vitals reviewed.  Constitutional:      Appearance: Normal appearance.  HENT:     Head: Normocephalic.  Eyes:     Extraocular  Movements: Extraocular movements intact.     Conjunctiva/sclera: Conjunctivae normal.     Pupils: Pupils are equal, round, and reactive to light.  Cardiovascular:     Rate and Rhythm: Normal rate and regular rhythm.     Pulses: Normal pulses.     Heart sounds: Normal heart sounds.  Pulmonary:     Effort: Pulmonary effort is normal.     Breath sounds: Normal breath sounds.  Musculoskeletal:     Cervical back: No tenderness.  Lymphadenopathy:     Cervical: No cervical adenopathy.  Skin:    General: Skin is warm and dry.     Capillary Refill: Capillary refill takes less than 2 seconds.  Neurological:     General: No focal deficit present.     Mental Status: He is alert and oriented to person, place, and time.  Psychiatric:        Mood and Affect: Mood normal.        Behavior: Behavior normal.      ASSESSMENT & PLAN: A total of 44 minutes was spent with the patient and counseling/coordination of care regarding preparing for this visit, review of most recent office visit notes, review of multiple chronic medical conditions under management, review of all medications, education on nutrition, cardiovascular risks associated with hypertension, prognosis, documentation, and need for follow-up.  Problem List Items Addressed This Visit       Cardiovascular and Mediastinum   Primary hypertension - Primary    Well-controlled hypertension. Continue amlodipine 10 mg and valsartan 160 mg daily Cardiovascular risks associated with hypertension discussed Diet and nutrition discussed.        Respiratory   OSA on CPAP    Stable.  On CPAP treatment. Recently seen by neurologist Office visit notes reviewed        Digestive   NASH (nonalcoholic steatohepatitis)    Stable.  Diet and nutrition discussed.        Endocrine   HYPOTHYROIDISM, POSTSURGICAL  Clinically euthyroid. Normal recent TSH Continue Synthroid 200 mcg daily        Musculoskeletal and Integument   DISC  DISEASE, LUMBAR    And chronic lumbar pain Gabapentin and hydroxychloroquine helping        Genitourinary   Stage 3a chronic kidney disease (HCC)    Stable condition. Advised to stay well-hydrated and avoid NSAIDs        Other   Sarcoidosis    Stable and asymptomatic.      Lower urinary tract symptoms    Much improved with tamsulosin 0.4 mg daily.       Patient Instructions  Hypertension, Adult High blood pressure (hypertension) is when the force of blood pumping through the arteries is too strong. The arteries are the blood vessels that carry blood from the heart throughout the body. Hypertension forces the heart to work harder to pump blood and may cause arteries to become narrow or stiff. Untreated or uncontrolled hypertension can lead to a heart attack, heart failure, a stroke, kidney disease, and other problems. A blood pressure reading consists of a higher number over a lower number. Ideally, your blood pressure should be below 120/80. The first ("top") number is called the systolic pressure. It is a measure of the pressure in your arteries as your heart beats. The second ("bottom") number is called the diastolic pressure. It is a measure of the pressure in your arteries as the heart relaxes. What are the causes? The exact cause of this condition is not known. There are some conditions that result in high blood pressure. What increases the risk? Certain factors may make you more likely to develop high blood pressure. Some of these risk factors are under your control, including: Smoking. Not getting enough exercise or physical activity. Being overweight. Having too much fat, sugar, calories, or salt (sodium) in your diet. Drinking too much alcohol. Other risk factors include: Having a personal history of heart disease, diabetes, high cholesterol, or kidney disease. Stress. Having a family history of high blood pressure and high cholesterol. Having obstructive sleep  apnea. Age. The risk increases with age. What are the signs or symptoms? High blood pressure may not cause symptoms. Very high blood pressure (hypertensive crisis) may cause: Headache. Fast or irregular heartbeats (palpitations). Shortness of breath. Nosebleed. Nausea and vomiting. Vision changes. Severe chest pain, dizziness, and seizures. How is this diagnosed? This condition is diagnosed by measuring your blood pressure while you are seated, with your arm resting on a flat surface, your legs uncrossed, and your feet flat on the floor. The cuff of the blood pressure monitor will be placed directly against the skin of your upper arm at the level of your heart. Blood pressure should be measured at least twice using the same arm. Certain conditions can cause a difference in blood pressure between your right and left arms. If you have a high blood pressure reading during one visit or you have normal blood pressure with other risk factors, you may be asked to: Return on a different day to have your blood pressure checked again. Monitor your blood pressure at home for 1 week or longer. If you are diagnosed with hypertension, you may have other blood or imaging tests to help your health care provider understand your overall risk for other conditions. How is this treated? This condition is treated by making healthy lifestyle changes, such as eating healthy foods, exercising more, and reducing your alcohol intake. You may be referred for counseling on  a healthy diet and physical activity. Your health care provider may prescribe medicine if lifestyle changes are not enough to get your blood pressure under control and if: Your systolic blood pressure is above 130. Your diastolic blood pressure is above 80. Your personal target blood pressure may vary depending on your medical conditions, your age, and other factors. Follow these instructions at home: Eating and drinking  Eat a diet that is high in  fiber and potassium, and low in sodium, added sugar, and fat. An example of this eating plan is called the DASH diet. DASH stands for Dietary Approaches to Stop Hypertension. To eat this way: Eat plenty of fresh fruits and vegetables. Try to fill one half of your plate at each meal with fruits and vegetables. Eat whole grains, such as whole-wheat pasta, brown rice, or whole-grain bread. Fill about one fourth of your plate with whole grains. Eat or drink low-fat dairy products, such as skim milk or low-fat yogurt. Avoid fatty cuts of meat, processed or cured meats, and poultry with skin. Fill about one fourth of your plate with lean proteins, such as fish, chicken without skin, beans, eggs, or tofu. Avoid pre-made and processed foods. These tend to be higher in sodium, added sugar, and fat. Reduce your daily sodium intake. Many people with hypertension should eat less than 1,500 mg of sodium a day. Do not drink alcohol if: Your health care provider tells you not to drink. You are pregnant, may be pregnant, or are planning to become pregnant. If you drink alcohol: Limit how much you have to: 0-1 drink a day for women. 0-2 drinks a day for men. Know how much alcohol is in your drink. In the [place] one drink equals one 12 oz bottle of beer (355 mL), one 5 oz glass of wine (148 mL), or one 1 oz glass of hard liquor (44 mL). Lifestyle  Work with your health care provider to maintain a healthy body weight or to lose weight. Ask what an ideal weight is for you. Get at least 30 minutes of exercise that causes your heart to beat faster (aerobic exercise) most days of the week. Activities may include walking, swimming, or biking. Include exercise to strengthen your muscles (resistance exercise), such as Pilates or lifting weights, as part of your weekly exercise routine. Try to do these types of exercises for 30 minutes at least 3 days a week. Do not use any products that contain nicotine or tobacco. These  products include cigarettes, chewing tobacco, and vaping devices, such as e-cigarettes. If you need help quitting, ask your health care provider. Monitor your blood pressure at home as told by your health care provider. Keep all follow-up visits. This is important. Medicines Take over-the-counter and prescription medicines only as told by your health care provider. Follow directions carefully. Blood pressure medicines must be taken as prescribed. Do not skip doses of blood pressure medicine. Doing this puts you at risk for problems and can make the medicine less effective. Ask your health care provider about side effects or reactions to medicines that you should watch for. Contact a health care provider if you: Think you are having a reaction to a medicine you are taking. Have headaches that keep coming back (recurring). Feel dizzy. Have swelling in your ankles. Have trouble with your vision. Get help right away if you: Develop a severe headache or confusion. Have unusual weakness or numbness. Feel faint. Have severe pain in your chest or abdomen. Vomit repeatedly. Have  trouble breathing. These symptoms may be an emergency. Get help right away. Call 911. Do not wait to see if the symptoms will go away. Do not drive yourself to the hospital. Summary Hypertension is when the force of blood pumping through your arteries is too strong. If this condition is not controlled, it may put you at risk for serious complications. Your personal target blood pressure may vary depending on your medical conditions, your age, and other factors. For most people, a normal blood pressure is less than 120/80. Hypertension is treated with lifestyle changes, medicines, or a combination of both. Lifestyle changes include losing weight, eating a healthy, low-sodium diet, exercising more, and limiting alcohol. This information is not intended to replace advice given to you by your health care provider. Make sure you  discuss any questions you have with your health care provider. Document Revised: 01/04/2021 Document Reviewed: 01/04/2021 Elsevier Patient Education  2024 Elsevier Inc.     Edwina Barth, MD [facility]

## 2022-08-31 NOTE — Assessment & Plan Note (Signed)
Stable.  On CPAP treatment. Recently seen by neurologist Office visit notes reviewed

## 2022-08-31 NOTE — Assessment & Plan Note (Signed)
Much improved with tamsulosin 0.4 mg daily.

## 2022-08-31 NOTE — Assessment & Plan Note (Signed)
And chronic lumbar pain Gabapentin and hydroxychloroquine helping

## 2022-09-04 NOTE — Telephone Encounter (Signed)
I see referral was placed on 08/31/22.

## 2022-09-20 ENCOUNTER — Telehealth: Payer: Self-pay | Admitting: Hematology and Oncology

## 2022-09-24 ENCOUNTER — Other Ambulatory Visit: Payer: Self-pay | Admitting: Emergency Medicine

## 2022-09-24 DIAGNOSIS — E039 Hypothyroidism, unspecified: Secondary | ICD-10-CM

## 2022-09-26 ENCOUNTER — Other Ambulatory Visit: Payer: Medicare HMO

## 2022-09-26 ENCOUNTER — Encounter: Payer: Medicare HMO | Admitting: Hematology and Oncology

## 2022-10-03 DIAGNOSIS — D869 Sarcoidosis, unspecified: Secondary | ICD-10-CM | POA: Diagnosis not present

## 2022-10-03 DIAGNOSIS — I129 Hypertensive chronic kidney disease with stage 1 through stage 4 chronic kidney disease, or unspecified chronic kidney disease: Secondary | ICD-10-CM | POA: Diagnosis not present

## 2022-10-03 DIAGNOSIS — N1831 Chronic kidney disease, stage 3a: Secondary | ICD-10-CM | POA: Diagnosis not present

## 2022-10-05 ENCOUNTER — Inpatient Hospital Stay: Payer: Medicare HMO | Attending: Hematology and Oncology | Admitting: Hematology and Oncology

## 2022-10-05 ENCOUNTER — Encounter: Payer: Self-pay | Admitting: Hematology and Oncology

## 2022-10-05 ENCOUNTER — Other Ambulatory Visit: Payer: Self-pay

## 2022-10-05 ENCOUNTER — Inpatient Hospital Stay: Payer: Medicare HMO

## 2022-10-05 VITALS — BP 138/67 | HR 84 | Temp 98.2°F | Resp 18 | Ht 67.0 in | Wt 204.8 lb

## 2022-10-05 DIAGNOSIS — E039 Hypothyroidism, unspecified: Secondary | ICD-10-CM | POA: Diagnosis not present

## 2022-10-05 DIAGNOSIS — Z9089 Acquired absence of other organs: Secondary | ICD-10-CM

## 2022-10-05 DIAGNOSIS — D472 Monoclonal gammopathy: Secondary | ICD-10-CM

## 2022-10-05 DIAGNOSIS — I10 Essential (primary) hypertension: Secondary | ICD-10-CM | POA: Diagnosis not present

## 2022-10-05 DIAGNOSIS — Z8249 Family history of ischemic heart disease and other diseases of the circulatory system: Secondary | ICD-10-CM | POA: Insufficient documentation

## 2022-10-05 DIAGNOSIS — Z79890 Hormone replacement therapy (postmenopausal): Secondary | ICD-10-CM | POA: Diagnosis not present

## 2022-10-05 DIAGNOSIS — Z79899 Other long term (current) drug therapy: Secondary | ICD-10-CM | POA: Diagnosis not present

## 2022-10-05 DIAGNOSIS — D869 Sarcoidosis, unspecified: Secondary | ICD-10-CM | POA: Insufficient documentation

## 2022-10-05 DIAGNOSIS — Z79624 Long term (current) use of inhibitors of nucleotide synthesis: Secondary | ICD-10-CM | POA: Insufficient documentation

## 2022-10-05 DIAGNOSIS — Z818 Family history of other mental and behavioral disorders: Secondary | ICD-10-CM | POA: Diagnosis not present

## 2022-10-05 DIAGNOSIS — Z825 Family history of asthma and other chronic lower respiratory diseases: Secondary | ICD-10-CM | POA: Insufficient documentation

## 2022-10-05 DIAGNOSIS — M353 Polymyalgia rheumatica: Secondary | ICD-10-CM | POA: Diagnosis not present

## 2022-10-05 LAB — CMP (CANCER CENTER ONLY)
ALT: 19 U/L (ref 0–44)
AST: 28 U/L (ref 15–41)
Albumin: 4.4 g/dL (ref 3.5–5.0)
Alkaline Phosphatase: 84 U/L (ref 38–126)
Anion gap: 7 (ref 5–15)
BUN: 17 mg/dL (ref 8–23)
CO2: 28 mmol/L (ref 22–32)
Calcium: 9.7 mg/dL (ref 8.9–10.3)
Chloride: 104 mmol/L (ref 98–111)
Creatinine: 1.27 mg/dL — ABNORMAL HIGH (ref 0.61–1.24)
GFR, Estimated: >60 mL/min (ref >60)
Glucose, Bld: 81 mg/dL (ref 70–99)
Potassium: 4.1 mmol/L (ref 3.5–5.1)
Sodium: 139 mmol/L (ref 135–145)
Total Bilirubin: 0.8 mg/dL (ref 0.3–1.2)
Total Protein: 7.1 g/dL (ref 6.5–8.1)

## 2022-10-05 LAB — CBC WITH DIFFERENTIAL/PLATELET
Abs Immature Granulocytes: 0.02 10*3/uL (ref 0.00–0.07)
Basophils Absolute: 0.1 10*3/uL (ref 0.0–0.1)
Basophils Relative: 1 %
Eosinophils Absolute: 0.3 10*3/uL (ref 0.0–0.5)
Eosinophils Relative: 3 %
HCT: 40.4 % (ref 39.0–52.0)
Hemoglobin: 14 g/dL (ref 13.0–17.0)
Immature Granulocytes: 0 %
Lymphocytes Relative: 27 %
Lymphs Abs: 2 10*3/uL (ref 0.7–4.0)
MCH: 30.2 pg (ref 26.0–34.0)
MCHC: 34.7 g/dL (ref 30.0–36.0)
MCV: 87.3 fL (ref 80.0–100.0)
Monocytes Absolute: 0.9 10*3/uL (ref 0.1–1.0)
Monocytes Relative: 12 %
Neutro Abs: 4.2 10*3/uL (ref 1.7–7.7)
Neutrophils Relative %: 57 %
Platelets: 259 10*3/uL (ref 150–400)
RBC: 4.63 MIL/uL (ref 4.22–5.81)
RDW: 13.6 % (ref 11.5–15.5)
WBC: 7.4 10*3/uL (ref 4.0–10.5)
nRBC: 0 % (ref 0.0–0.2)

## 2022-10-05 NOTE — Progress Notes (Signed)
[facility] CONSULT NOTE  Patient Care Team: Georgina Quint, MD as PCP - General (Internal Medicine) Nyoka Cowden, MD (Pulmonary Disease) Kimbrough, Emmaline Life., MD as Attending Physician (Urology)  CHIEF COMPLAINTS/PURPOSE OF CONSULTATION:  MGUS  ASSESSMENT & PLAN:  This is a very pleasant 72 yr male with Ig G kappa MGUS referred to hematology.  He is clinically asymptomatic.  We have discussed the following findings about MGUS.  Monoclonal gammopathy of undetermined significance is a clinically asymptomatic premalignant clonal plasma cell or lymphoplasmacytic proliferative disorder defined by presence of monoclonal protein at a concentration less than 3 g/dL, bone marrow with less than 10% monoclonal plasma cells and absence of endorgan damage.  It occurs in over 3% of the white population over the age of 43 and typically detected as an incidental finding when patients undergo protein electrophoresis for evaluation of other clinical symptoms and disorders.  We categorize MGUS and do not IgM MGUS, IgM MGUS and light chain MGUS.  We risk stratify MGUS based on serum M protein level, non-IgG MGUS and abnormal serum free light chain ratio. Depending on the risk factors present, the risk of transformation to myeloma can vary.  Patients with low risk MGUS have a risk of progression of 45% over 20 years and may be followed with history and physical exam alone. All of the patients are followed up with annual SPEP, free light chain assay, complete blood count, complete metabolic panel.    In this patient, we have reviewed the above-mentioned findings.  We will proceed with CBC, CMP, kappa lambda light chain ratio, immunoglobulin levels and beta-2 microglobulin.  If there are no high risk features from the above-mentioned labs, he can return to clinic in 6 months as scheduled.   HISTORY OF PRESENTING ILLNESS:  Philip Woodard 72 y.o. male is here because of  MGUS  This is  a very pleasant 72 year old male patient with past medical history significant for gout, osteoarthritis, sarcoidosis, polymyalgia rheumatica seen by Dr. Corliss Skains in dermatology referred to hematology for IgG kappa MGUS.  Patient tells me that several months ago last year he had diffuse muscle pains and has since been followed by rheumatology, was on prednisone and hydroxychloroquine.  He had some blood work recently which showed some abnormalities and hence he was referred today.  He denies any recent flareup of his muscle pain or joint pain.  He is feeling much better.  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  Denies any symptoms today.  He is very active, exercises regularly. No new bone pains, change in breathing, change in bowel habits or urinary habits.  Rest of the pertinent 10 point ROS reviewed and negative  REVIEW OF SYSTEMS:   Constitutional: Denies fevers, chills or abnormal night sweats Eyes: Denies blurriness of vision, double vision or watery eyes Ears, nose, mouth, throat, and face: Denies mucositis or sore throat Respiratory: Denies cough, dyspnea or wheezes Cardiovascular: Denies palpitation, chest discomfort or lower extremity swelling Gastrointestinal:  Denies nausea, heartburn or change in bowel habits Skin: Denies abnormal skin rashes Lymphatics: Denies new lymphadenopathy or easy bruising Neurological:Denies numbness, tingling or new weaknesses Behavioral/Psych: Mood is stable, no new changes  All other systems were reviewed with the patient and are negative.  MEDICAL HISTORY:  Past Medical History:  Diagnosis Date   Alkaline phosphatase raised 06/28/2011   Fluctuating - suspect due to sarcoidosis   Allergic rhinitis    Allergy    Arthritis  knees   Genital herpes    GERD (gastroesophageal reflux disease)    Hemorrhoids    Hyperglycemia    Hyperplastic lymph node 06/27/2011   Submental node excised 1/02; regrowth: re-excision 10/06   Hypertension     Hypothyroidism    Multinodular goiter    Rheumatoid arteritis (HCC)    Routine general medical examination at a health care facility    Sarcoidosis    Sleep apnea    wears cpap     SURGICAL HISTORY: Past Surgical History:  Procedure Laterality Date   COLONOSCOPY  5-23/2003   COLONOSCOPY  02/04/2018   TA   COLONOSCOPY WITH PROPOFOL  05/26/2021   [facility] Head at [facility]   POLYPECTOMY     ROTATOR CUFF REPAIR Bilateral [id number]   THYROIDECTOMY  2002    SOCIAL HISTORY: Social History   Socioeconomic History   Marital status: Married    Spouse name: Not on file   Number of children: 2   Years of education: Not on file   Highest education level: Not on file  Occupational History   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  Other Topics Concern   Not on file  Social History Narrative   Not on file   Social Determinants of Health   Financial Resource Strain: Low Risk  (04/25/2022)   Overall Financial Resource Strain (CARDIA)    Difficulty of Paying Living Expenses: Not hard at all  Food Insecurity: No Food Insecurity (04/04/2021)   Hunger Vital Sign    Worried About Running Out of Food in the Last Year: Never true    Ran Out of Food in the Last Year: Never true  Transportation Needs: No Transportation Needs (04/25/2022)   PRAPARE - Administrator, Civil Service (Medical): No    Lack of Transportation (Non-Medical): No  Physical Activity: Sufficiently Active (04/25/2022)   Exercise Vital Sign    Days of Exercise per Week: 5 days    Minutes of Exercise per Session: 60 min  Stress: No Stress Concern Present (04/25/2022)   [employer] of Occupational Health - Occupational Stress Questionnaire     Feeling of Stress : Not at all  Social Connections: Moderately Integrated (04/25/2022)   Social Connection and Isolation Panel [NHANES]    Frequency of Communication with Friends and Family: Twice a week    Frequency of Social Gatherings with Friends and Family: Once a week    Attends Religious Services: More than 4 times per year    Active Member of [employer] or Organizations: No    Attends Banker Meetings: Never    Marital Status: Married  Catering manager Violence: Not At Risk (04/25/2022)   Humiliation, Afraid, Rape, and Kick questionnaire    Fear of Current or Ex-Partner: No    Emotionally Abused: No    Physically Abused: No    Sexually Abused: No    FAMILY HISTORY: Family History  Problem Relation Age of Onset   Hypertension Mother    Dementia Father    Sleep apnea Brother    Healthy Brother    Sleep apnea Paternal Aunt    Asthma Cousin    Healthy  Daughter    Healthy Daughter    Sarcoidosis Neg Hx    Colon polyps Neg Hx    Colon cancer Neg Hx    Esophageal cancer Neg Hx    Stomach cancer Neg Hx    Rectal cancer Neg Hx     ALLERGIES:  is allergic to other and shellfish allergy.  MEDICATIONS:  Current Outpatient Medications  Medication Sig Dispense Refill   acetaminophen (TYLENOL) 650 MG CR tablet Take 650 mg by mouth every 8 (eight) hours as needed.     acyclovir (ZOVIRAX) 800 MG tablet Take 1 tablet (800 mg total) by mouth daily. 90 tablet 0   amLODipine (NORVASC) 10 MG tablet TAKE 1 TABLET BY MOUTH EVERY DAY 90 tablet 1   cholecalciferol (VITAMIN D3) 25 MCG (1000 UT) tablet Take 1,000 Units by mouth daily.     gabapentin (NEURONTIN) 300 MG capsule Take 1 capsule (300 mg total) by mouth 3 (three) times daily. 90 capsule 1   Glucosamine-Chondroit-Vit C-Mn (GLUCOSAMINE 1500 COMPLEX) CAPS Take by mouth daily.     hydroxychloroquine (PLAQUENIL) 200 MG tablet Take 1 tablet (200 mg total) by mouth 2 (two) times daily. 180 tablet 1   levothyroxine (SYNTHROID)  200 MCG tablet TAKE 1 TABLET BY MOUTH EVERY DAY 90 tablet 3   Multiple Vitamin (MULTIVITAMIN) LIQD Take 5 mLs by mouth daily.     Omega-3 Fatty Acids (FISH OIL) 1000 MG CAPS Take 2 capsules by mouth daily.     pantoprazole (PROTONIX) 40 MG tablet Take 1 tablet (40 mg total) by mouth daily. 90 tablet 4   sildenafil (REVATIO) 20 MG tablet Take 5 tablets as directed 30-45 minutes prior to intercourse (Patient not taking: Reported on 08/31/2022) 50 tablet 5   tamsulosin (FLOMAX) 0.4 MG CAPS capsule Take 1 capsule (0.4 mg total) by mouth daily. 90 capsule 1   Turmeric 500 MG TABS Take 1 tablet by mouth 2 (two) times daily.     valsartan (DIOVAN) 160 MG tablet TAKE 1 TABLET BY MOUTH EVERY DAY 90 tablet 3   zinc gluconate 50 MG tablet Take 50 mg by mouth daily.     No current facility-administered medications for this visit.     PHYSICAL EXAMINATION: ECOG PERFORMANCE STATUS: 0 - Asymptomatic  Vitals:   10/05/22 1043  BP: 138/67  Pulse: 84  Resp: 18  Temp: 98.2 F (36.8 C)  SpO2: 96%   Filed Weights   10/05/22 1043  Weight: 204 lb 12.8 oz (92.9 kg)    GENERAL:alert, no distress and comfortable SKIN: skin color, texture, turgor are normal, no rashes or significant lesions EYES: normal, conjunctiva are pink and non-injected, sclera clear OROPHARYNX:no exudate, no erythema and lips, buccal mucosa, and tongue normal  NECK: supple, thyroid normal size, non-tender, without nodularity LYMPH:  no palpable lymphadenopathy in the cervical, axillary LUNGS: clear to auscultation and percussion with normal breathing effort HEART: regular rate & rhythm and no murmurs and no lower extremity edema ABDOMEN:abdomen soft, non-tender and normal bowel sounds Musculoskeletal:no cyanosis of digits and no clubbing  PSYCH: alert & oriented x 3 with fluent speech NEURO: no focal motor/sensory deficits  LABORATORY DATA:  I have reviewed the data as listed Lab Results  Component Value Date   WBC 7.5  05/15/2022   HGB 14.4 05/15/2022   HCT 42.1 05/15/2022   MCV 89.2 05/15/2022   PLT 278 05/15/2022     Chemistry      Component Value Date/Time   NA 143 05/15/2022  0934   K 4.5 05/15/2022 0934   CL 106 05/15/2022 0934   CO2 28 05/15/2022 0934   BUN 19 05/15/2022 0934   CREATININE 1.43 (H) 05/15/2022 0934      Component Value Date/Time   CALCIUM 10.0 05/15/2022 0934   ALKPHOS 61 01/25/2022 1006   AST 36 (H) 05/15/2022 0934   ALT 24 05/15/2022 0934   BILITOT 0.7 05/15/2022 0934       RADIOGRAPHIC STUDIES: I have personally reviewed the radiological images as listed and agreed with the findings in the report. No results found.  All questions were answered. The patient knows to call the clinic with any problems, questions or concerns. I spent 45 minutes in the care of this patient including H and P, review of records, counseling and coordination of care.     Rachel Moulds, MD 10/05/2022 11:00 AM

## 2022-10-06 LAB — IGG, IGA, IGM: IgA: 298 mg/dL (ref 61–437)

## 2022-10-09 ENCOUNTER — Other Ambulatory Visit: Payer: Self-pay | Admitting: *Deleted

## 2022-10-09 DIAGNOSIS — Z8249 Family history of ischemic heart disease and other diseases of the circulatory system: Secondary | ICD-10-CM | POA: Diagnosis not present

## 2022-10-09 DIAGNOSIS — D472 Monoclonal gammopathy: Secondary | ICD-10-CM

## 2022-10-09 DIAGNOSIS — E039 Hypothyroidism, unspecified: Secondary | ICD-10-CM | POA: Diagnosis not present

## 2022-10-09 DIAGNOSIS — D869 Sarcoidosis, unspecified: Secondary | ICD-10-CM | POA: Diagnosis not present

## 2022-10-09 DIAGNOSIS — Z79899 Other long term (current) drug therapy: Secondary | ICD-10-CM | POA: Diagnosis not present

## 2022-10-09 DIAGNOSIS — Z825 Family history of asthma and other chronic lower respiratory diseases: Secondary | ICD-10-CM | POA: Diagnosis not present

## 2022-10-09 DIAGNOSIS — Z79624 Long term (current) use of inhibitors of nucleotide synthesis: Secondary | ICD-10-CM | POA: Diagnosis not present

## 2022-10-09 DIAGNOSIS — Z79890 Hormone replacement therapy (postmenopausal): Secondary | ICD-10-CM | POA: Diagnosis not present

## 2022-10-09 DIAGNOSIS — Z818 Family history of other mental and behavioral disorders: Secondary | ICD-10-CM | POA: Diagnosis not present

## 2022-10-09 DIAGNOSIS — I10 Essential (primary) hypertension: Secondary | ICD-10-CM | POA: Diagnosis not present

## 2022-10-09 DIAGNOSIS — M353 Polymyalgia rheumatica: Secondary | ICD-10-CM | POA: Diagnosis not present

## 2022-10-09 DIAGNOSIS — Z9089 Acquired absence of other organs: Secondary | ICD-10-CM | POA: Diagnosis not present

## 2022-10-10 NOTE — Progress Notes (Signed)
Office Visit Note  Patient: Philip Woodard             Date of Birth: 11/11/1950           MRN: [id number]             PCP: Georgina Quint, MD Referring: Georgina Quint, * Visit Date: 10/24/2022 Occupation: @GUAROCC @  Subjective:  Medication management  History of Present Illness: Philip Woodard is a 72 y.o. male with a history of gout, osteoarthritis, sarcoidosis and polymyalgia rheumatica. He has no complaints today and is feeling well.  He has completely tapered off of the prednisone approximately 1 year ago per patient.  And had no issues with discontinuation of prednisone.  He denies any muscular weakness or tenderness.  He continues to take Hydroxychloroquine 200 mg BID, no complaints of side effects.  He denies history of gout flare.  There is no history of shortness of breath or rashes on his skin to suggest sarcoidosis flare.  He states that he has not had a flare for 10-15 years, but when he did flare, skin lesions started on his lower legs and ascended.  He had chronic elevation of CK.  It is elevated further when he works out and [employer], which is frequently. His PCP will continue to monitor.  He was prescribed the Gabapentin approximately 1.5 years ago for his sports medicine doctor for widespread pain, but he stays on it due to its SE of urinary retention.  Prior to the prescription, he had concerns of polyuria.  He did stop the Gabapentin approximately 9 weeks ago for about 6 weeks on account of difficulty filling his prescription; his widespread pain did not return.   Activities of Daily Living:  Patient reports morning stiffness for 0 minutes.   Patient Denies nocturnal pain.  Difficulty dressing/grooming: Denies Difficulty climbing stairs: Denies Difficulty getting out of chair: Denies Difficulty using hands for taps, buttons, cutlery, and/or writing: Denies  Review of Systems  Constitutional:  Negative for fatigue.  HENT:  Negative for mouth sores  and mouth dryness.   Eyes:  Negative for dryness.  Respiratory:  Negative for shortness of breath.   Cardiovascular:  Negative for chest pain and palpitations.  Gastrointestinal:  Negative for blood in stool, constipation and diarrhea.  Endocrine: Negative for increased urination.  Genitourinary:  Negative for involuntary urination.  Musculoskeletal:  Negative for joint pain, gait problem, joint pain, joint swelling, myalgias, muscle weakness, morning stiffness, muscle tenderness and myalgias.  Skin:  Negative for color change, rash, hair loss and sensitivity to sunlight.  Allergic/Immunologic: Negative for susceptible to infections.  Neurological:  Negative for dizziness and headaches.  Hematological:  Negative for swollen glands.  Psychiatric/Behavioral:  Negative for depressed mood and sleep disturbance. The patient is not nervous/anxious.     PMFS History:  Patient Active Problem List   Diagnosis Date Noted   Bilateral carpal tunnel syndrome 08/21/2022   NCGS (non-celiac gluten sensitivity) 01/25/2022   Internal hemorrhoids 01/25/2022   Cervical radiculopathy 10/17/2021   Lower urinary tract symptoms 07/20/2021   Chronic idiopathic gout involving toe of left foot without tophus 04/27/2021   Stage 3a chronic kidney disease (HCC) 03/31/2021   Erectile dysfunction due to arterial insufficiency 03/31/2021   NASH (nonalcoholic steatohepatitis) 02/22/2015   Arthritis of right lower extremity 07/20/2014   Hearing loss 02/19/2014   OSA on CPAP 05/02/2013   Hypothyroid 03/29/2012   Primary hypertension 11/24/2008   HYPOTHYROIDISM, POSTSURGICAL 07/07/2008  HYPERTROPHY PROSTATE W/UR OBST & OTH LUTS 07/07/2008   DISC DISEASE, LUMBAR 12/27/2007   Sarcoidosis 06/06/2007   GOITER, MULTINODULAR 06/06/2007   HEMORRHOIDS 06/06/2007   ASTHMA 06/06/2007    Past Medical History:  Diagnosis Date   Alkaline phosphatase raised 06/28/2011   Fluctuating - suspect due to sarcoidosis   Allergic  rhinitis    Allergy    Arthritis    knees   Genital herpes    GERD (gastroesophageal reflux disease)    Hemorrhoids    Hyperglycemia    Hyperplastic lymph node 06/27/2011   Submental node excised 1/02; regrowth: re-excision 10/06   Hypertension    Hypothyroidism    Multinodular goiter    Rheumatoid arteritis (HCC)    Routine general medical examination at a health care facility    Sarcoidosis    Sleep apnea    wears cpap     Family History  Problem Relation Age of Onset   Hypertension Mother    Dementia Father    Sleep apnea Brother    Healthy Brother    Sleep apnea Paternal Aunt    Asthma Cousin    Healthy Daughter    Healthy Daughter    Sarcoidosis Neg Hx    Colon polyps Neg Hx    Colon cancer Neg Hx    Esophageal cancer Neg Hx    Stomach cancer Neg Hx    Rectal cancer Neg Hx    Past Surgical History:  Procedure Laterality Date   COLONOSCOPY  5-23/2003   COLONOSCOPY  02/04/2018   TA   COLONOSCOPY WITH PROPOFOL  05/26/2021   [facility] at [facility]   POLYPECTOMY     ROTATOR CUFF REPAIR Bilateral [id number]   THYROIDECTOMY  2002   Social History   Social History Narrative   Not on file   Immunization History  Administered Date(s) Administered   Fluad Quad(high Dose 65+) 12/11/2018, 01/20/2020, 03/31/2021, 01/25/2022   Influenza Whole 12/11/2009, 12/11/2011   Influenza, High Dose Seasonal PF 11/14/2017   Influenza,inj,Quad PF,6+ Mos 01/31/2013, 12/10/2013, 02/22/2016, 12/19/2016   Moderna Sars-Covid-2 Vaccination 03/18/2020   PFIZER(Purple Top)SARS-COV-2 Vaccination 04/04/2019, 04/25/2019   PNEUMOCOCCAL CONJUGATE-20 01/25/2022   Pneumococcal Conjugate-13 11/14/2017   Pneumococcal Polysaccharide-23 01/31/2013   Td 10/23/2005   Tdap 02/22/2016   Zoster, Live 01/31/2013     Objective: Vital Signs: BP 138/70 (BP Location: Left Arm, Patient Position: Sitting, Cuff Size: Normal)   Pulse 75   Resp 15   Ht 5\' 7"  (1.702 m)   Wt 203 lb 6.4 oz (92.3 kg)   BMI  31.86 kg/m    Physical Exam Constitutional:      Appearance: Normal appearance. He is normal weight.  HENT:     Head: Normocephalic.  Eyes:     Extraocular Movements: Extraocular movements intact.  Cardiovascular:     Rate and Rhythm: Normal rate and regular rhythm.  Pulmonary:     Effort: Pulmonary effort is normal.     Breath sounds: Normal breath sounds.  Musculoskeletal:     Cervical back: Normal range of motion.  Skin:    General: Skin is warm and dry.     Capillary Refill: Capillary refill takes less than 2 seconds.  Neurological:     Mental Status: He is alert and oriented to person, place, and time.  Psychiatric:        Mood and Affect: Mood normal.     Physical Exam Constitutional:      Appearance: Normal appearance. He is normal  weight.  HENT:     Head: Normocephalic.  Eyes:     Extraocular Movements: Extraocular movements intact.  Cardiovascular:     Rate and Rhythm: Normal rate and regular rhythm.  Pulmonary:     Effort: Pulmonary effort is normal.     Breath sounds: Normal breath sounds.  Musculoskeletal:     Cervical back: Normal range of motion.  Skin:    General: Skin is warm and dry.     Capillary Refill: Capillary refill takes less than 2 seconds.  Neurological:     Mental Status: He is alert and oriented to person, place, and time.  Psychiatric:        Mood and Affect: Mood normal.     Musculoskeletal Exam: Cervical spine had good range of motion.  No spinal tenderness.  Shoulder joints, wrist joints, MCPs PIPs and DIPs have good range of motion with no synovitis, warmth or effusion.  Elbow joints bilaterally have 10-degrees of contracture but are otherwise good range of motion.  Thickening of the DIP and PIP joints bilaterally. Good strength of upper extremities. Hip joints, knee joints, ankles, MTPs and PIPs with good range of motion with no synovitis, warmth or effusion.  No muscular weakness or tenderness was noted.  He had no difficulty getting  off the squatting position.  CDAI Exam: CDAI Score: 0  Patient Global: 0 / 100; Provider Global: 0 / 100 Swollen: 0 ; Tender: 0  Joint Exam 10/24/2022   All documented joints were normal     Investigation: No additional findings.  Imaging: No results found.  Recent Labs: Lab Results  Component Value Date   WBC 7.4 10/05/2022   HGB 14.0 10/05/2022   PLT 259 10/05/2022   NA 139 10/05/2022   K 4.1 10/05/2022   CL 104 10/05/2022   CO2 28 10/05/2022   GLUCOSE 81 10/05/2022   BUN 17 10/05/2022   CREATININE 1.27 (H) 10/05/2022   BILITOT 0.8 10/05/2022   ALKPHOS 84 10/05/2022   AST 28 10/05/2022   ALT 19 10/05/2022   PROT 7.1 10/05/2022   ALBUMIN 4.4 10/05/2022   CALCIUM 9.7 10/05/2022   GFRAA  02/20/2009    >60        The eGFR has been calculated using the MDRD equation. This calculation has not been validated in all clinical situations. eGFR's persistently <60 mL/min signify possible Chronic Kidney Disease.   [facility] NEGATIVE 07/08/2021    Speciality Comments: PLQ Eye Exam 09/23/2021 normal [facility] f/u 12 months. MTX-dcd due to increase in Cr  Procedures:  No procedures performed Allergies: Other and Shellfish allergy   Assessment / Plan:     Visit Diagnoses: Polymyalgia rheumatica (HCC) - dxd by his PCP.  Currently taking Hydroxychloroquine, no longer taking Prednisone.  He had no muscular weakness or tenderness on the examination.  He had no difficulty getting up from the squatting position.  High risk medication use - Hydroxychloroquine 200 mg BID. PLQ Eye Exam 09/23/2021.  Patient has repeat eye examination is scheduled in September 2024.  Labs obtained on October 05, 2022 were reviewed.  CBC and CMP were stable with creatinine elevated at 1.27.- Plan: COMPLETE METABOLIC PANEL WITH GFR, CBC with Differential/Platelet in December.  Information about immunization was placed in the AVS.  Elevated CK - CK was 531 on 01/10/2022.  In August  myositis panel and HMG CR antibodies were negative. July of 2024 UPEP within normal limits.  He exercises and lifts weights regularly and has good  strength on physical exam.  CK is perpetually high at baseline.  Most recent value was 558 on 10/12/22.  PCP will continue to monitor.  He had no muscular weakness or tenderness.  Sarcoidosis - Positive lymph node biopsy in 2001.  No symptoms today.  Denies any shortness of breath.  Chronic idiopathic gout involving toe of left foot without tophus - Related to diet per patient. uric acid: 3.9 on 04/27/2021. - Plan: Uric acid  No complaints or symptoms today.  Chondromalacia patellae, right knee-he denied any any discomfort today.  No warmth swelling or effusion was noted.  DDD (degenerative disc disease), cervical, no complaints today  DDD (degenerative disc disease), lumbar - Levoscoliosis and multilevel spondylosis was noted on the previous x-rays.  No complaints toay.  Primary hypertension-blood pressure is normal at 138/70.  Other medical problems listed as follows:  Stage 3a chronic kidney disease (HCC) - He has followed up with nephrology.  NASH (nonalcoholic steatohepatitis)  Vitamin D deficiency, on vitamin D supplement  History of asthma  Thyroid: Multinodular goiter, History of hypothyroidism s/p thyroidectomy in 2002, on Levothyroxine  OSA on CPAP  Orders: Orders Placed This Encounter  Procedures   COMPLETE METABOLIC PANEL WITH GFR   Uric acid   CBC with Differential/Platelet   No orders of the defined types were placed in this encounter.    Follow-Up Instructions: Return in about 5 months (around 03/26/2023) for Osteoarthritis, Sarcoidosis, Gout.   Pollyann Savoy, MD  Note - This record has been created using Animal nutritionist.  Chart creation errors have been sought, but may not always  have been located. Such creation errors do not reflect on  the standard of medical care.

## 2022-10-11 LAB — UPEP/TP, 24-HR URINE
Albumin, U: 40.2 %
Alpha 1, Urine: 7.6 %
Alpha 2, Urine: 16.7 %
Beta, Urine: 24.1 %
Gamma Globulin, Urine: 11.4 %
Total Protein, Urine-Ur/day: 109 mg/(24.h) (ref 30–150)
Total Protein, Urine: 7.5 mg/dL
Total Volume: 1450

## 2022-10-12 ENCOUNTER — Other Ambulatory Visit: Payer: Self-pay

## 2022-10-12 DIAGNOSIS — M353 Polymyalgia rheumatica: Secondary | ICD-10-CM

## 2022-10-12 DIAGNOSIS — R748 Abnormal levels of other serum enzymes: Secondary | ICD-10-CM | POA: Diagnosis not present

## 2022-10-13 NOTE — Progress Notes (Signed)
CK is still elevated and stable.

## 2022-10-24 ENCOUNTER — Ambulatory Visit: Payer: Medicare HMO | Attending: Rheumatology | Admitting: Rheumatology

## 2022-10-24 ENCOUNTER — Encounter: Payer: Self-pay | Admitting: Rheumatology

## 2022-10-24 VITALS — BP 138/70 | HR 75 | Resp 15 | Ht 67.0 in | Wt 203.4 lb

## 2022-10-24 DIAGNOSIS — Z79899 Other long term (current) drug therapy: Secondary | ICD-10-CM | POA: Diagnosis not present

## 2022-10-24 DIAGNOSIS — M1A0720 Idiopathic chronic gout, left ankle and foot, without tophus (tophi): Secondary | ICD-10-CM | POA: Diagnosis not present

## 2022-10-24 DIAGNOSIS — E559 Vitamin D deficiency, unspecified: Secondary | ICD-10-CM

## 2022-10-24 DIAGNOSIS — D869 Sarcoidosis, unspecified: Secondary | ICD-10-CM | POA: Diagnosis not present

## 2022-10-24 DIAGNOSIS — M353 Polymyalgia rheumatica: Secondary | ICD-10-CM | POA: Diagnosis not present

## 2022-10-24 DIAGNOSIS — Z8709 Personal history of other diseases of the respiratory system: Secondary | ICD-10-CM

## 2022-10-24 DIAGNOSIS — M5030 Other cervical disc degeneration, unspecified cervical region: Secondary | ICD-10-CM | POA: Diagnosis not present

## 2022-10-24 DIAGNOSIS — I10 Essential (primary) hypertension: Secondary | ICD-10-CM | POA: Diagnosis not present

## 2022-10-24 DIAGNOSIS — R748 Abnormal levels of other serum enzymes: Secondary | ICD-10-CM

## 2022-10-24 DIAGNOSIS — M2241 Chondromalacia patellae, right knee: Secondary | ICD-10-CM | POA: Diagnosis not present

## 2022-10-24 DIAGNOSIS — M5136 Other intervertebral disc degeneration, lumbar region: Secondary | ICD-10-CM

## 2022-10-24 DIAGNOSIS — E042 Nontoxic multinodular goiter: Secondary | ICD-10-CM

## 2022-10-24 DIAGNOSIS — K7581 Nonalcoholic steatohepatitis (NASH): Secondary | ICD-10-CM | POA: Diagnosis not present

## 2022-10-24 DIAGNOSIS — Z8639 Personal history of other endocrine, nutritional and metabolic disease: Secondary | ICD-10-CM

## 2022-10-24 DIAGNOSIS — N1831 Chronic kidney disease, stage 3a: Secondary | ICD-10-CM

## 2022-10-24 DIAGNOSIS — G4733 Obstructive sleep apnea (adult) (pediatric): Secondary | ICD-10-CM

## 2022-10-24 NOTE — Patient Instructions (Signed)
Standing Labs We placed an order today for your standing lab work.   Please have your standing labs drawn in December  Please have your labs drawn 2 weeks prior to your appointment so that the provider can discuss your lab results at your appointment, if possible.  Please note that you may see your imaging and lab results in MyChart before we have reviewed them. We will contact you once all results are reviewed. Please allow our office up to 72 hours to thoroughly review all of the results before contacting the office for clarification of your results.  WALK-IN LAB HOURS  Monday through Thursday from 8:00 am -12:30 pm and 1:00 pm-5:00 pm and Friday from 8:00 am-12:00 pm.  Patients with office visits requiring labs will be seen before walk-in labs.  You may encounter longer than normal wait times. Please allow additional time. Wait times may be shorter on  Monday and Thursday afternoons.  We do not book appointments for walk-in labs. We appreciate your patience and understanding with our staff.   Labs are drawn by [facility]. Please bring your co-pay at the time of your lab draw.  You may receive a bill from [facility] for your lab work Please note if you are on Hydroxychloroquine and and an order has been placed for a Hydroxychloroquine level,  you will need to have it drawn 4 hours or more after your last dose.  If you wish to have your labs drawn at another location, please call the office 24 hours in advance so we can fax the orders.  The office is located at [address], [place], [place] [address]   If you have any questions regarding directions or hours of operation,  please call [phone].   As a reminder, please drink plenty of water prior to coming for your lab work. Thanks!  Vaccines You are taking a medication(s) that can suppress your immune system.  The following immunizations are recommended: Flu annually Covid-19  RSV Td/Tdap (tetanus, diphtheria, pertussis)  every 10 years Pneumonia (Prevnar 15 then Pneumovax 23 at least 1 year apart.  Alternatively, can take Prevnar 20 without needing additional dose) Shingrix: 2 doses from 4 weeks to 6 months apart  Please check with your PCP to make sure you are up to date.

## 2022-11-24 ENCOUNTER — Inpatient Hospital Stay: Payer: Medicare HMO | Attending: Hematology and Oncology | Admitting: Hematology and Oncology

## 2022-11-24 ENCOUNTER — Telehealth: Payer: Self-pay

## 2022-11-24 DIAGNOSIS — D472 Monoclonal gammopathy: Secondary | ICD-10-CM | POA: Diagnosis not present

## 2022-11-24 NOTE — Progress Notes (Signed)
[facility] CONSULT NOTE  Patient Care Team: Georgina Quint, MD as PCP - General (Internal Medicine) Nyoka Cowden, MD (Pulmonary Disease) Kimbrough, Emmaline Life., MD as Attending Physician (Urology)  CHIEF COMPLAINTS/PURPOSE OF CONSULTATION:  MGUS  ASSESSMENT & PLAN:  This is a very pleasant 72 yr male with Ig G kappa MGUS referred to hematology.  He is clinically asymptomatic.  We have discussed the following findings about MGUS. Patient denies any new health complaints since his last visit here.  He has IgG kappa MGUS, kappa lambda ratio mildly elevated.  CBC and CMP without any major concerns.  Immunoglobulin levels are completely normal, no evidence of immunoparesis. At this time we have recommended that he come back for follow-up in 6 months with repeat labs.  If he has steady monoclonal protein with no significant change during his next visit, we can extend his follow-ups to once a year.  He was however strongly encouraged to call us with any change in his health and he expressed understanding.  HISTORY OF PRESENTING ILLNESS:  LIAHM TREMONTI 72 y.o. male is here because of  MGUS  This is a very pleasant 72 year old male patient with past medical history significant for gout, osteoarthritis, sarcoidosis, polymyalgia rheumatica seen by Dr. Corliss Skains in dermatology referred to hematology for IgG kappa MGUS.  He is here for telephone follow-up to review his lab results regarding his MGUS. Patient denies any new health complaint since his last visit here.  Rest of the pertinent 10 point ROS reviewed and negative  MEDICAL HISTORY:  Past Medical History:  Diagnosis Date   Alkaline phosphatase raised 06/28/2011   Fluctuating - suspect due to sarcoidosis   Allergic rhinitis    Allergy    Arthritis    knees   Genital herpes    GERD (gastroesophageal reflux disease)    Hemorrhoids    Hyperglycemia    Hyperplastic lymph node 06/27/2011   Submental node excised  1/02; regrowth: re-excision 10/06   Hypertension    Hypothyroidism    Multinodular goiter    Rheumatoid arteritis (HCC)    Routine general medical examination at a health care facility    Sarcoidosis    Sleep apnea    wears cpap     SURGICAL HISTORY: Past Surgical History:  Procedure Laterality Date   COLONOSCOPY  5-23/2003   COLONOSCOPY  02/04/2018   TA   COLONOSCOPY WITH PROPOFOL  05/26/2021   [facility] at [facility]   POLYPECTOMY     ROTATOR CUFF REPAIR Bilateral [id number]   THYROIDECTOMY  2002    SOCIAL HISTORY: Social History   Socioeconomic History   Marital status: Married    Spouse name: Not on file   Number of children: 2   Years of education: Not on file   Highest education level: Not on file  Occupational History   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  Other Topics Concern   Not on file  Social History Narrative   Not on file   Social Determinants of Health   Financial Resource Strain: Low Risk  (04/25/2022)   Overall Financial Resource Strain (CARDIA)    Difficulty of  Paying Living Expenses: Not hard at all  Food Insecurity: No Food Insecurity (04/04/2021)   Hunger Vital Sign    Worried About Running Out of Food in the Last Year: Never true    Ran Out of Food in the Last Year: Never true  Transportation Needs: No Transportation Needs (04/25/2022)   PRAPARE - Administrator, Civil Service (Medical): No    Lack of Transportation (Non-Medical): No  Physical Activity: Sufficiently Active (04/25/2022)   Exercise Vital Sign    Days of Exercise per Week: 5 days    Minutes of Exercise per Session: 60 min  Stress: No Stress Concern Present (04/25/2022)   [employer] of  Occupational Health - Occupational Stress Questionnaire    Feeling of Stress : Not at all  Social Connections: Moderately Integrated (04/25/2022)   Social Connection and Isolation Panel [NHANES]    Frequency of Communication with Friends and Family: Twice a week    Frequency of Social Gatherings with Friends and Family: Once a week    Attends Religious Services: More than 4 times per year    Active Member of [employer] or Organizations: No    Attends Banker Meetings: Never    Marital Status: Married  Catering manager Violence: Not At Risk (04/25/2022)   Humiliation, Afraid, Rape, and Kick questionnaire    Fear of Current or Ex-Partner: No    Emotionally Abused: No    Physically Abused: No    Sexually Abused: No    FAMILY HISTORY: Family History  Problem Relation Age of Onset   Hypertension Mother    Dementia Father    Sleep apnea Brother    Healthy Brother    Sleep apnea Paternal Aunt    Asthma Cousin    Healthy Daughter    Healthy Daughter    Sarcoidosis Neg Hx    Colon polyps Neg Hx    Colon cancer Neg Hx    Esophageal cancer Neg Hx    Stomach cancer Neg Hx    Rectal cancer Neg Hx     ALLERGIES:  is allergic to other and shellfish allergy.  MEDICATIONS:  Current Outpatient Medications  Medication Sig Dispense Refill   acetaminophen (TYLENOL) 650 MG CR tablet Take 650 mg by mouth every 8 (eight) hours as needed.     acyclovir (ZOVIRAX) 800 MG tablet Take 1 tablet (800 mg total) by mouth daily. 90 tablet 0   amLODipine (NORVASC) 10 MG tablet TAKE 1 TABLET BY MOUTH EVERY DAY 90 tablet 1   cholecalciferol (VITAMIN D3) 25 MCG (1000 UT) tablet Take 1,000 Units by mouth daily.     gabapentin (NEURONTIN) 300 MG capsule Take 1 capsule (300 mg total) by mouth 3 (three) times daily. 90 capsule 1   Glucosamine-Chondroit-Vit C-Mn (GLUCOSAMINE 1500 COMPLEX) CAPS Take by mouth daily.     hydroxychloroquine (PLAQUENIL) 200 MG tablet Take 1 tablet (200 mg total) by mouth 2  (two) times daily. 180 tablet 1   levothyroxine (SYNTHROID) 200 MCG tablet TAKE 1 TABLET BY MOUTH EVERY DAY 90 tablet 3   Multiple Vitamin (MULTIVITAMIN) LIQD Take 5 mLs by mouth daily.     Omega-3 Fatty Acids (FISH OIL) 1000 MG CAPS Take 2 capsules by mouth daily.     pantoprazole (PROTONIX) 40 MG tablet Take 1 tablet (40 mg total) by mouth daily. 90 tablet 4   tamsulosin (FLOMAX) 0.4 MG CAPS capsule Take 1 capsule (0.4 mg total) by mouth daily. 90 capsule 1  Turmeric 500 MG TABS Take 1 tablet by mouth 2 (two) times daily.     valsartan (DIOVAN) 160 MG tablet TAKE 1 TABLET BY MOUTH EVERY DAY 90 tablet 3   zinc gluconate 50 MG tablet Take 50 mg by mouth daily.     No current facility-administered medications for this visit.     PHYSICAL EXAMINATION: ECOG PERFORMANCE STATUS: 0 - Asymptomatic  There were no vitals filed for this visit.  There were no vitals filed for this visit.   Physical exam deferred, telephone visit  LABORATORY DATA:  I have reviewed the data as listed Lab Results  Component Value Date   WBC 7.4 10/05/2022   HGB 14.0 10/05/2022   HCT 40.4 10/05/2022   MCV 87.3 10/05/2022   PLT 259 10/05/2022     Chemistry      Component Value Date/Time   NA 139 10/05/2022 1132   K 4.1 10/05/2022 1132   CL 104 10/05/2022 1132   CO2 28 10/05/2022 1132   BUN 17 10/05/2022 1132   CREATININE 1.27 (H) 10/05/2022 1132   CREATININE 1.43 (H) 05/15/2022 0934      Component Value Date/Time   CALCIUM 9.7 10/05/2022 1132   ALKPHOS 84 10/05/2022 1132   AST 28 10/05/2022 1132   ALT 19 10/05/2022 1132   BILITOT 0.8 10/05/2022 1132       RADIOGRAPHIC STUDIES: I have personally reviewed the radiological images as listed and agreed with the findings in the report. No results found.  All questions were answered. The patient knows to call the clinic with any problems, questions or concerns. I spent 10 minutes in the care of this patient including H and P, review of records,  counseling and coordination of care.  I connected with  [name] on 11/24/22 by a telephone application and verified that I am speaking with the correct person using two identifiers.   I discussed the limitations of evaluation and management by telemedicine. The patient expressed understanding and agreed to proceed.     Rachel Moulds, MD 11/24/2022 10:40 AM

## 2022-11-24 NOTE — Telephone Encounter (Signed)
This RN LVM for pt to inform him that Dr. Al Pimple will contact him around lunch time for his telephone visit appt. Call back number [phone] left.

## 2022-12-01 DIAGNOSIS — H2513 Age-related nuclear cataract, bilateral: Secondary | ICD-10-CM | POA: Diagnosis not present

## 2022-12-01 DIAGNOSIS — Z79899 Other long term (current) drug therapy: Secondary | ICD-10-CM | POA: Diagnosis not present

## 2022-12-01 DIAGNOSIS — H5203 Hypermetropia, bilateral: Secondary | ICD-10-CM | POA: Diagnosis not present

## 2022-12-12 ENCOUNTER — Encounter: Payer: Self-pay | Admitting: Emergency Medicine

## 2022-12-12 NOTE — Telephone Encounter (Signed)
Not sure what he is referring to but we will try to help him as much as we can.  Thanks.

## 2022-12-12 NOTE — Telephone Encounter (Signed)
100 mg

## 2022-12-13 ENCOUNTER — Other Ambulatory Visit (HOSPITAL_COMMUNITY): Payer: Self-pay

## 2022-12-13 ENCOUNTER — Telehealth: Payer: Self-pay

## 2022-12-13 MED ORDER — SILDENAFIL CITRATE 100 MG PO TABS: 100.0000 mg | ORAL_TABLET | Freq: Every day | ORAL | 0 refills | Status: DC | PRN

## 2022-12-13 NOTE — Telephone Encounter (Signed)
PA needed for Sildenafil.

## 2022-12-13 NOTE — Telephone Encounter (Addendum)
Pharmacy Patient Advocate Encounter   Received notification from Patient Advice Request messages that prior authorization for Sildenafil Citrate 100MG  tablets is required/requested.   Insurance verification completed.   The patient is insured through [facility] .   Per test claim: CANCELLED due to: The medication you have requested is not covered by Medicare Part D Law.   Key: [id number]

## 2023-02-12 ENCOUNTER — Other Ambulatory Visit: Payer: Self-pay | Admitting: *Deleted

## 2023-02-12 DIAGNOSIS — Z79899 Other long term (current) drug therapy: Secondary | ICD-10-CM

## 2023-02-12 DIAGNOSIS — M1A0720 Idiopathic chronic gout, left ankle and foot, without tophus (tophi): Secondary | ICD-10-CM

## 2023-02-13 LAB — CBC WITH DIFFERENTIAL/PLATELET
Absolute Lymphocytes: 2349 {cells}/uL (ref 850–3900)
Absolute Monocytes: 940 {cells}/uL (ref 200–950)
Basophils Absolute: 52 {cells}/uL (ref 0–200)
Basophils Relative: 0.6 %
Eosinophils Absolute: 348 {cells}/uL (ref 15–500)
Eosinophils Relative: 4 %
HCT: 35.5 % — ABNORMAL LOW (ref 38.5–50.0)
Hemoglobin: 11.8 g/dL — ABNORMAL LOW (ref 13.2–17.1)
MCH: 28.8 pg (ref 27.0–33.0)
MCHC: 33.2 g/dL (ref 32.0–36.0)
MCV: 86.6 fL (ref 80.0–100.0)
MPV: 11.3 fL (ref 7.5–12.5)
Monocytes Relative: 10.8 %
Neutro Abs: 5011 {cells}/uL (ref 1500–7800)
Neutrophils Relative %: 57.6 %
Platelets: 278 10*3/uL (ref 140–400)
RBC: 4.1 10*6/uL — ABNORMAL LOW (ref 4.20–5.80)
RDW: 13 % (ref 11.0–15.0)
Total Lymphocyte: 27 %
WBC: 8.7 10*3/uL (ref 3.8–10.8)

## 2023-02-13 LAB — COMPLETE METABOLIC PANEL WITH GFR
AG Ratio: 1.5 (calc) (ref 1.0–2.5)
ALT: 19 U/L (ref 9–46)
AST: 27 U/L (ref 10–35)
Albumin: 4 g/dL (ref 3.6–5.1)
Alkaline phosphatase (APISO): 85 U/L (ref 35–144)
BUN/Creatinine Ratio: 12 (calc) (ref 6–22)
BUN: 17 mg/dL (ref 7–25)
CO2: 30 mmol/L (ref 20–32)
Calcium: 9.1 mg/dL (ref 8.6–10.3)
Chloride: 104 mmol/L (ref 98–110)
Creat: 1.39 mg/dL — ABNORMAL HIGH (ref 0.70–1.28)
Globulin: 2.7 g/dL (ref 1.9–3.7)
Glucose, Bld: 98 mg/dL (ref 65–99)
Potassium: 3.8 mmol/L (ref 3.5–5.3)
Sodium: 140 mmol/L (ref 135–146)
Total Bilirubin: 0.6 mg/dL (ref 0.2–1.2)
Total Protein: 6.7 g/dL (ref 6.1–8.1)
eGFR: 55 mL/min/{1.73_m2} — ABNORMAL LOW (ref >=60)

## 2023-02-13 LAB — URIC ACID: Uric Acid, Serum: 5.5 mg/dL (ref 4.0–8.0)

## 2023-02-13 NOTE — Progress Notes (Signed)
Hemoglobin is low at 11.8.  Patient should see his PCP for the drop in hemoglobin.  Creatinine is elevated at 1.39 which is higher than before.  Uric acid is 5.5 which is in the desirable range.

## 2023-02-25 ENCOUNTER — Other Ambulatory Visit: Payer: Self-pay | Admitting: Emergency Medicine

## 2023-02-27 ENCOUNTER — Other Ambulatory Visit: Payer: Self-pay | Admitting: Emergency Medicine

## 2023-03-05 ENCOUNTER — Ambulatory Visit: Payer: Medicare HMO | Admitting: Emergency Medicine

## 2023-03-12 ENCOUNTER — Ambulatory Visit: Payer: Medicare HMO | Admitting: Emergency Medicine

## 2023-03-15 NOTE — Progress Notes (Signed)
Office Visit Note  Patient: Philip Woodard             Date of Birth: 1950-10-08           MRN: [id number]             PCP: Georgina Quint, MD Referring: Georgina Quint, * Visit Date: 03/29/2023 Occupation: @GUAROCC @  Subjective:  Medication management  History of Present Illness: Philip Woodard is a 73 y.o. male with gout, osteoarthritis, sarcoidosis and polymyalgia rheumatica.  He denies any increased muscular weakness or tenderness.  He denies any shortness of breath or inflammatory arthritis.  He has not had any gout flares.  He denies any increased joint pain.  He has been taking hydroxychloroquine 200 mg twice a day without any interruption.  He states he had eye examination few months back and he will forward results to [place].    Activities of Daily Living:  Patient reports morning stiffness for 20 minutes.   Patient Denies nocturnal pain.  Difficulty dressing/grooming: Denies Difficulty climbing stairs: Denies Difficulty getting out of chair: Denies Difficulty using hands for taps, buttons, cutlery, and/or writing: Denies  Review of Systems  Constitutional:  Negative for fatigue.  HENT:  Negative for mouth sores and mouth dryness.   Eyes:  Negative for dryness.  Respiratory:  Negative for shortness of breath.   Cardiovascular:  Negative for chest pain and palpitations.  Gastrointestinal:  Negative for blood in stool, constipation and diarrhea.  Endocrine: Negative for increased urination.  Genitourinary:  Negative for involuntary urination.  Musculoskeletal:  Positive for joint pain, joint pain and morning stiffness. Negative for gait problem, joint swelling, myalgias, muscle weakness, muscle tenderness and myalgias.  Skin:  Negative for color change, rash, hair loss and sensitivity to sunlight.  Allergic/Immunologic: Negative for susceptible to infections.  Neurological:  Negative for dizziness and headaches.  Hematological:  Negative for swollen glands.   Psychiatric/Behavioral:  Negative for depressed mood and sleep disturbance. The patient is not nervous/anxious.     PMFS History:  Patient Active Problem List   Diagnosis Date Noted   Anemia 03/19/2023   Bilateral carpal tunnel syndrome 08/21/2022   NCGS (non-celiac gluten sensitivity) 01/25/2022   Internal hemorrhoids 01/25/2022   Cervical radiculopathy 10/17/2021   Lower urinary tract symptoms 07/20/2021   Chronic idiopathic gout involving toe of left foot without tophus 04/27/2021   Stage 3a chronic kidney disease (HCC) 03/31/2021   Erectile dysfunction due to arterial insufficiency 03/31/2021   NASH (nonalcoholic steatohepatitis) 02/22/2015   Arthritis of right lower extremity 07/20/2014   Hearing loss 02/19/2014   OSA on CPAP 05/02/2013   Hypothyroid 03/29/2012   Primary hypertension 11/24/2008   HYPOTHYROIDISM, POSTSURGICAL 07/07/2008   HYPERTROPHY PROSTATE W/UR OBST & OTH LUTS 07/07/2008   DISC DISEASE, LUMBAR 12/27/2007   Sarcoidosis 06/06/2007   GOITER, MULTINODULAR 06/06/2007   Hemorrhoids 06/06/2007   Asthma 06/06/2007    Past Medical History:  Diagnosis Date   Alkaline phosphatase raised 06/28/2011   Fluctuating - suspect due to sarcoidosis   Allergic rhinitis    Allergy    Arthritis    knees   Genital herpes    GERD (gastroesophageal reflux disease)    Hemorrhoids    Hyperglycemia    Hyperplastic lymph node 06/27/2011   Submental node excised 1/02; regrowth: re-excision 10/06   Hypertension    Hypothyroidism    Multinodular goiter    Rheumatoid arteritis (HCC)    Routine general medical examination at a  health care facility    Sarcoidosis    Sleep apnea    wears cpap     Family History  Problem Relation Age of Onset   Hypertension Mother    Dementia Father    Sleep apnea Brother    Healthy Brother    Sleep apnea Paternal Aunt    Asthma Cousin    Healthy Daughter    Healthy Daughter    Sarcoidosis Neg Hx    Colon polyps Neg Hx    Colon  cancer Neg Hx    Esophageal cancer Neg Hx    Stomach cancer Neg Hx    Rectal cancer Neg Hx    Past Surgical History:  Procedure Laterality Date   COLONOSCOPY  5-23/2003   COLONOSCOPY  02/04/2018   TA   COLONOSCOPY WITH PROPOFOL  05/26/2021   [facility] at [facility]   POLYPECTOMY     ROTATOR CUFF REPAIR Bilateral [id number]   THYROIDECTOMY  2002   Social History   Social History Narrative   Not on file   Immunization History  Administered Date(s) Administered   Fluad Quad(high Dose 65+) 12/11/2018, 01/20/2020, 03/31/2021, 01/25/2022   Fluad Trivalent(High Dose 65+) 03/19/2023   Influenza Whole 12/11/2009, 12/11/2011   Influenza, High Dose Seasonal PF 11/14/2017   Influenza,inj,Quad PF,6+ Mos 01/31/2013, 12/10/2013, 02/22/2016, 12/19/2016   Moderna Sars-Covid-2 Vaccination 03/18/2020   PFIZER(Purple Top)SARS-COV-2 Vaccination 04/04/2019, 04/25/2019   PNEUMOCOCCAL CONJUGATE-20 01/25/2022   Pneumococcal Conjugate-13 11/14/2017   Pneumococcal Polysaccharide-23 01/31/2013   Td 10/23/2005   Tdap 02/22/2016   Zoster, Live 01/31/2013     Objective: Vital Signs: BP (!) 142/76 (BP Location: Left Arm, Patient Position: Sitting, Cuff Size: Large)   Pulse 66   Resp 14   Ht 5\' 7"  (1.702 m)   Wt 203 lb (92.1 kg)   BMI 31.79 kg/m    Physical Exam Vitals and nursing note reviewed.  Constitutional:      Appearance: He is well-developed.  HENT:     Head: Normocephalic and atraumatic.  Eyes:     Conjunctiva/sclera: Conjunctivae normal.     Pupils: Pupils are equal, round, and reactive to light.  Cardiovascular:     Rate and Rhythm: Normal rate and regular rhythm.     Heart sounds: Normal heart sounds.  Pulmonary:     Effort: Pulmonary effort is normal.     Breath sounds: Normal breath sounds.  Abdominal:     General: Bowel sounds are normal.     Palpations: Abdomen is soft.  Musculoskeletal:     Cervical back: Normal range of motion and neck supple.  Skin:    General: Skin  is warm and dry.     Capillary Refill: Capillary refill takes less than 2 seconds.  Neurological:     Mental Status: He is alert and oriented to person, place, and time.  Psychiatric:        Behavior: Behavior normal.      Musculoskeletal Exam: Cervical, thoracic and lumbar spine were in good range of motion.  He had no difficulty getting up from the squatting position.  Shoulders, elbows, wrist joints, MCPs PIPs and DIPs Juengel range of motion with no synovitis.  Hip joints and knee joints in good range of motion without any warmth swelling or effusion.  There was no tenderness over ankles or MTPs.  CDAI Exam: CDAI Score: -- Patient Global: --; Provider Global: -- Swollen: --; Tender: -- Joint Exam 03/29/2023   No joint exam has been documented for  this visit   There is currently no information documented on the homunculus. Go to the Rheumatology activity and complete the homunculus joint exam.  Investigation: No additional findings.  Imaging: No results found.  Recent Labs: Lab Results  Component Value Date   WBC 7.7 03/19/2023   HGB 12.8 (L) 03/19/2023   PLT 275.0 03/19/2023   NA 140 03/19/2023   K 4.4 03/19/2023   CL 104 03/19/2023   CO2 30 03/19/2023   GLUCOSE 81 03/19/2023   BUN 20 03/19/2023   CREATININE 1.30 03/19/2023   BILITOT 0.8 03/19/2023   ALKPHOS 91 03/19/2023   AST 24 03/19/2023   ALT 17 03/19/2023   PROT 7.0 03/19/2023   ALBUMIN 4.4 03/19/2023   CALCIUM 9.8 03/19/2023   GFRAA  02/20/2009    >60        The eGFR has been calculated using the MDRD equation. This calculation has not been validated in all clinical situations. eGFR's persistently <60 mL/min signify possible Chronic Kidney Disease.   [facility] NEGATIVE 07/08/2021    Speciality Comments: PLQ Eye Exam 07/01/2022 normal [facility]mology f/u 12 months. MTX-dcd due to increase in Cr  Procedures:  No procedures performed Allergies: Other and Shellfish allergy    Assessment / Plan:     Visit Diagnoses: Polymyalgia rheumatica (HCC) - dxd by his PCP.  He is on hydroxychloroquine 200 mg p.o. twice daily.  He has been off prednisone for more than 1 year.  No muscular weakness or tenderness was noted on the examination.  He had no difficulty getting up from the squatting position.  Sarcoidosis - Lymph node biopsy positive in 2001.  Asymptomatic.  He denies any shortness of breath.  There is no history of rash or inflammatory arthritis.  Chronic idiopathic gout involving toe of left foot without tophus - Uric acid 5.5 on February 12, 2023.  He has not had any flares of gout since last visit.  High risk medication use - Hydroxychloroquine 200 mg p.o. twice daily.  Eye exam July 01, 2022.  Patient states he had no recent eye examination and he will forward results to [place].  Labs were normal in January which included CBC and CMP.  I advised him to have repeat labs in 5 months along with CK.  Elevated CK - CK was 531 on 01/10/2022. August 2024 myositis panel and HMG CR antibodies were negative. July o2024 UPEP normal.  No muscle weakness - Plan: CK with next labs  Chondromalacia patellae, right knee-patient has no increase in knee joint pain.  DDD (degenerative disc disease), cervical-he denies any discomfort.  He had good range of motion.  Degeneration of intervertebral disc of lumbar region without discogenic back pain or lower extremity pain - Multilevel spondylosis and levoscoliosis was noted on the previous x-rays.  Hip good range of motion.  Stage 3a chronic kidney disease (HCC)-creatinine was 1.30 on March 19, 2023.  He is followed by nephrology.  NASH (nonalcoholic steatohepatitis)-LFTs were normal on March 19, 2023.  Primary hypertension-pressure was elevated today at 142/76.  He was advised to monitor pressure closely and follow-up with his PCP.  Vitamin D deficiency  History of asthma  History of hypothyroidism - Status post thyroidectomy 2002  for goiter.  OSA on CPAP  Orders: Orders Placed This Encounter  Procedures   CK   Uric acid   No orders of the defined types were placed in this encounter.   Follow-Up Instructions: Return in about 5 months (around 08/27/2023) for PMR,  Sarcoidosis, Osteoarthritis.   Pollyann Savoy, MD  Note - This record has been created using Animal nutritionist.  Chart creation errors have been sought, but may not always  have been located. Such creation errors do not reflect on  the standard of medical care.

## 2023-03-19 ENCOUNTER — Encounter: Payer: Self-pay | Admitting: Emergency Medicine

## 2023-03-19 ENCOUNTER — Ambulatory Visit (INDEPENDENT_AMBULATORY_CARE_PROVIDER_SITE_OTHER): Payer: Medicare Other | Admitting: Emergency Medicine

## 2023-03-19 VITALS — BP 128/68 | HR 75 | Temp 98.5°F | Ht 67.0 in | Wt 204.0 lb

## 2023-03-19 DIAGNOSIS — M1A0720 Idiopathic chronic gout, left ankle and foot, without tophus (tophi): Secondary | ICD-10-CM

## 2023-03-19 DIAGNOSIS — E039 Hypothyroidism, unspecified: Secondary | ICD-10-CM

## 2023-03-19 DIAGNOSIS — R399 Unspecified symptoms and signs involving the genitourinary system: Secondary | ICD-10-CM

## 2023-03-19 DIAGNOSIS — Z23 Encounter for immunization: Secondary | ICD-10-CM

## 2023-03-19 DIAGNOSIS — N1831 Chronic kidney disease, stage 3a: Secondary | ICD-10-CM

## 2023-03-19 DIAGNOSIS — D869 Sarcoidosis, unspecified: Secondary | ICD-10-CM

## 2023-03-19 DIAGNOSIS — I10 Essential (primary) hypertension: Secondary | ICD-10-CM | POA: Diagnosis not present

## 2023-03-19 DIAGNOSIS — D649 Anemia, unspecified: Secondary | ICD-10-CM | POA: Insufficient documentation

## 2023-03-19 LAB — COMPREHENSIVE METABOLIC PANEL
ALT: 17 U/L (ref 0–53)
AST: 24 U/L (ref 0–37)
Albumin: 4.4 g/dL (ref 3.5–5.2)
Alkaline Phosphatase: 91 U/L (ref 39–117)
BUN: 20 mg/dL (ref 6–23)
CO2: 30 meq/L (ref 19–32)
Calcium: 9.8 mg/dL (ref 8.4–10.5)
Chloride: 104 meq/L (ref 96–112)
Creatinine, Ser: 1.3 mg/dL (ref 0.40–1.50)
GFR: 55.56 mL/min — ABNORMAL LOW (ref >60.00)
Glucose, Bld: 81 mg/dL (ref 70–99)
Potassium: 4.4 meq/L (ref 3.5–5.1)
Sodium: 140 meq/L (ref 135–145)
Total Bilirubin: 0.8 mg/dL (ref 0.2–1.2)
Total Protein: 7 g/dL (ref 6.0–8.3)

## 2023-03-19 LAB — CBC WITH DIFFERENTIAL/PLATELET
Basophils Absolute: 0.1 10*3/uL (ref 0.0–0.1)
Basophils Relative: 0.9 % (ref 0.0–3.0)
Eosinophils Absolute: 0.2 10*3/uL (ref 0.0–0.7)
Eosinophils Relative: 2.8 % (ref 0.0–5.0)
HCT: 39 % (ref 39.0–52.0)
Hemoglobin: 12.8 g/dL — ABNORMAL LOW (ref 13.0–17.0)
Lymphocytes Relative: 27.4 % (ref 12.0–46.0)
Lymphs Abs: 2.1 10*3/uL (ref 0.7–4.0)
MCHC: 32.9 g/dL (ref 30.0–36.0)
MCV: 88.2 fL (ref 78.0–100.0)
Monocytes Absolute: 0.9 10*3/uL (ref 0.1–1.0)
Monocytes Relative: 11.7 % (ref 3.0–12.0)
Neutro Abs: 4.4 10*3/uL (ref 1.4–7.7)
Neutrophils Relative %: 57.2 % (ref 43.0–77.0)
Platelets: 275 10*3/uL (ref 150.0–400.0)
RBC: 4.43 Mil/uL (ref 4.22–5.81)
RDW: 14.5 % (ref 11.5–15.5)
WBC: 7.7 10*3/uL (ref 4.0–10.5)

## 2023-03-19 LAB — LIPID PANEL
Cholesterol: 144 mg/dL (ref 0–200)
HDL: 47.6 mg/dL (ref >39.00)
LDL Cholesterol: 80 mg/dL (ref 0–99)
NonHDL: 96.07
Total CHOL/HDL Ratio: 3
Triglycerides: 82 mg/dL (ref 0.0–149.0)
VLDL: 16.4 mg/dL (ref 0.0–40.0)

## 2023-03-19 LAB — TSH: TSH: 0.14 u[IU]/mL — ABNORMAL LOW (ref 0.35–5.50)

## 2023-03-19 LAB — FOLATE: Folate: 14.6 ng/mL (ref >5.9)

## 2023-03-19 LAB — VITAMIN B12: Vitamin B-12: 1173 pg/mL — ABNORMAL HIGH (ref 211–911)

## 2023-03-19 LAB — FERRITIN: Ferritin: 43.4 ng/mL (ref 22.0–322.0)

## 2023-03-19 LAB — PSA: PSA: 1.44 ng/mL (ref 0.10–4.00)

## 2023-03-19 NOTE — Assessment & Plan Note (Signed)
Well-controlled hypertension. Continue amlodipine 10 mg and valsartan 160 mg daily Cardiovascular risks associated with hypertension discussed Diet and nutrition discussed.

## 2023-03-19 NOTE — Assessment & Plan Note (Signed)
 Clinically stable.  No recent flareups. Last uric acid 5.5

## 2023-03-19 NOTE — Assessment & Plan Note (Signed)
Much improved with tamsulosin 0.4 mg daily.

## 2023-03-19 NOTE — Assessment & Plan Note (Signed)
 Recent onset.  Asymptomatic and clinically stable No clinical signs of GI bleed. Colonoscopy report from 2023 reviewed.  No diverticulosis Precancerous polyps and internal hemorrhoids No rectal bleeding or melena.  Clinically stable. Will do blood work today Anemia panel requested

## 2023-03-19 NOTE — Assessment & Plan Note (Signed)
 Clinically euthyroid Normal TSH last May Continues Synthroid 200 mcg daily TSH done today

## 2023-03-19 NOTE — Patient Instructions (Signed)

## 2023-03-19 NOTE — Assessment & Plan Note (Signed)
 Stable and asymptomatic

## 2023-03-19 NOTE — Assessment & Plan Note (Signed)
Stable condition. Advised to stay well-hydrated and avoid NSAIDs 

## 2023-03-19 NOTE — Progress Notes (Signed)
 Philip Woodard 73 y.o.   Chief Complaint  Patient presents with   Follow-up    71 month f/u for HTN. No other concerns     HISTORY OF PRESENT ILLNESS: This is a 73 y.o. male A1A here for 71-month follow-up of chronic medical conditions including hypertension Overall doing well. Has no complaints or any other medical concerns today. Recent blood work done last December showed anemia with hemoglobin of 11.8 lower than before.  It was requested by his rheumatologist.  Advised to follow-up with PCP. Colonoscopy done in 2023 did not show diverticulosis.  Was positive for polyps and internal hemorrhoids Denies melena or rectal bleeding No changes in nutrition  HPI   Prior to Admission medications   Medication Sig Start Date End Date Taking? Authorizing Provider  acetaminophen  (TYLENOL ) 650 MG CR tablet Take 650 mg by mouth every 8 (eight) hours as needed.   Yes [provider]  acyclovir  (ZOVIRAX ) 800 MG tablet Take 1 tablet (800 mg total) by mouth daily. 08/31/22  Yes Evian Derringer, Emil Schanz, MD  amLODipine  (NORVASC ) 10 MG tablet TAKE 1 TABLET BY MOUTH EVERY DAY 04/28/22  Yes Aunesty Tyson, Emil Schanz, MD  cholecalciferol (VITAMIN D3) 25 MCG (1000 UT) tablet Take 1,000 Units by mouth daily.   Yes [provider]  gabapentin  (NEURONTIN ) 300 MG capsule Take 1 capsule (300 mg total) by mouth 3 (three) times daily. 08/31/22  Yes Carleton Vanvalkenburgh, Emil Schanz, MD  Glucosamine-Chondroit-Vit C-Mn (GLUCOSAMINE 1500 COMPLEX) CAPS Take by mouth daily.   Yes [provider]  hydroxychloroquine  (PLAQUENIL ) 200 MG tablet TAKE 1 TABLET BY MOUTH TWICE A DAY 02/27/23  Yes Tabrina Esty, Emil Schanz, MD  levothyroxine  (SYNTHROID ) 200 MCG tablet TAKE 1 TABLET BY MOUTH EVERY DAY 09/24/22  Yes Delainee Tramel, Emil Schanz, MD  Multiple Vitamin (MULTIVITAMIN) LIQD Take 5 mLs by mouth daily.   Yes [provider]  Omega-3 Fatty Acids (FISH OIL) 1000 MG CAPS Take 2 capsules by mouth daily.   Yes [provider]  pantoprazole  (PROTONIX ) 40 MG tablet Take 1 tablet (40 mg total) by mouth daily. 03/30/22  Yes Aneita Gwendlyn DASEN, MD  sildenafil  (VIAGRA ) 100 MG tablet TAKE 1 TABLET BY MOUTH EVERY DAY AS NEEDED FOR ERECTILE DYSFUNCTION 02/25/23  Yes Teleshia Lemere, Emil Schanz, MD  tamsulosin  (FLOMAX ) 0.4 MG CAPS capsule Take 1 capsule (0.4 mg total) by mouth daily. 08/31/22  Yes Editha Bridgeforth, Emil Schanz, MD  Turmeric 500 MG TABS Take 1 tablet by mouth 2 (two) times daily.   Yes [provider]  valsartan  (DIOVAN ) 160 MG tablet TAKE 1 TABLET BY MOUTH EVERY DAY 03/12/22  Yes Renald Haithcock, Emil Schanz, MD  zinc gluconate 50 MG tablet Take 50 mg by mouth daily.   Yes [provider]    Allergies  Allergen Reactions   Other     SEAFOOD   Shellfish Allergy  Swelling    Tongue and facial Swelling Tongue and facial Swelling    Patient Active Problem List   Diagnosis Date Noted   Bilateral carpal tunnel syndrome 08/21/2022   NCGS (non-celiac gluten sensitivity) 01/25/2022   Internal hemorrhoids 01/25/2022   Cervical radiculopathy 10/17/2021   Lower urinary tract symptoms 07/20/2021   Chronic idiopathic gout involving toe of left foot without tophus 04/27/2021   Stage 3a chronic kidney disease (HCC) 03/31/2021   Erectile dysfunction due to arterial insufficiency 03/31/2021   NASH (nonalcoholic steatohepatitis) 02/22/2015   Arthritis of right lower extremity 07/20/2014   Hearing loss 02/19/2014   OSA  on CPAP 05/02/2013   Hypothyroid 03/29/2012   Primary hypertension 11/24/2008   HYPOTHYROIDISM, POSTSURGICAL 07/07/2008   HYPERTROPHY PROSTATE W/UR OBST & OTH LUTS 07/07/2008   DISC DISEASE, LUMBAR 12/27/2007   Sarcoidosis 06/06/2007   GOITER, MULTINODULAR 06/06/2007   Hemorrhoids 06/06/2007   Asthma 06/06/2007    Past Medical History:  Diagnosis Date   Alkaline phosphatase raised 06/28/2011   Fluctuating - suspect due to sarcoidosis   Allergic rhinitis    Allergy     Arthritis     knees   Genital herpes    GERD (gastroesophageal reflux disease)    Hemorrhoids    Hyperglycemia    Hyperplastic lymph node 06/27/2011   Submental node excised 1/02; regrowth: re-excision 10/06   Hypertension    Hypothyroidism    Multinodular goiter    Rheumatoid arteritis (HCC)    Routine general medical examination at a health care facility    Sarcoidosis    Sleep apnea    wears cpap     Past Surgical History:  Procedure Laterality Date   COLONOSCOPY  5-23/2003   COLONOSCOPY  02/04/2018   TA   COLONOSCOPY WITH PROPOFOL  05/26/2021   Gwendlyn Buddy at [facility]   POLYPECTOMY     ROTATOR CUFF REPAIR Bilateral [id number]   THYROIDECTOMY  2002    Social History   Socioeconomic History   Marital status: Married    Spouse name: Not on file   Number of children: 2   Years of education: Not on file   Highest education level: Not on file  Occupational History   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  Other Topics Concern   Not on file  Social History Narrative   Not on file   Social Drivers of Health   Financial Resource Strain: Low Risk  (04/25/2022)   Overall Financial Resource Strain (CARDIA)    Difficulty of Paying Living Expenses: Not hard at all  Food Insecurity: No Food Insecurity (04/04/2021)   Hunger Vital Sign    Worried About Running Out of Food in the Last Year: Never true    Ran Out of Food in the Last Year: Never true  Transportation Needs: No Transportation Needs (04/25/2022)   PRAPARE - Administrator, Civil Service (Medical): No    Lack of Transportation (Non-Medical): No  Physical Activity: Sufficiently Active (04/25/2022)   Exercise Vital Sign    Days of Exercise per Week: 5 days     Minutes of Exercise per Session: 60 min  Stress: No Stress Concern Present (04/25/2022)   [employer] of Occupational Health - Occupational Stress Questionnaire    Feeling of Stress : Not at all  Social Connections: Moderately Integrated (04/25/2022)   Social Connection and Isolation Panel [NHANES]    Frequency of Communication with Friends and Family: Twice a week    Frequency of Social Gatherings with Friends and Family: Once a week    Attends Religious Services: More than 4 times per year    Active Member of [employer] or Organizations: No    Attends Banker Meetings: Never    Marital Status: Married  Catering Manager Violence: Not At Risk (04/25/2022)  Humiliation, Afraid, Rape, and Kick questionnaire    Fear of Current or Ex-Partner: No    Emotionally Abused: No    Physically Abused: No    Sexually Abused: No    Family History  Problem Relation Age of Onset   Hypertension Mother    Dementia Father    Sleep apnea Brother    Healthy Brother    Sleep apnea Paternal Aunt    Asthma Cousin    Healthy Daughter    Healthy Daughter    Sarcoidosis Neg Hx    Colon polyps Neg Hx    Colon cancer Neg Hx    Esophageal cancer Neg Hx    Stomach cancer Neg Hx    Rectal cancer Neg Hx      Review of Systems  Constitutional: Negative.  Negative for chills and fever.  HENT: Negative.  Negative for congestion and sore throat.   Respiratory: Negative.  Negative for cough and shortness of breath.   Cardiovascular: Negative.  Negative for chest pain and palpitations.  Gastrointestinal:  Negative for abdominal pain, diarrhea, nausea and vomiting.  Genitourinary: Negative.  Negative for dysuria and hematuria.  Skin: Negative.  Negative for rash.  Neurological: Negative.  Negative for dizziness and headaches.  All other systems reviewed and are negative.   Vitals:   03/19/23 1422  BP: 128/68  Pulse: 75  Temp: 98.5 F (36.9 C)  SpO2: 97%    Physical  Exam Constitutional:      Appearance: Normal appearance.  HENT:     Head: Normocephalic.     Mouth/Throat:     Mouth: Mucous membranes are moist.     Pharynx: Oropharynx is clear.  Eyes:     Extraocular Movements: Extraocular movements intact.     Pupils: Pupils are equal, round, and reactive to light.  Cardiovascular:     Rate and Rhythm: Normal rate and regular rhythm.     Pulses: Normal pulses.     Heart sounds: Normal heart sounds.  Pulmonary:     Effort: Pulmonary effort is normal.     Breath sounds: Normal breath sounds.  Abdominal:     Palpations: Abdomen is soft.     Tenderness: There is no abdominal tenderness.  Musculoskeletal:     Cervical back: No tenderness.  Lymphadenopathy:     Cervical: No cervical adenopathy.  Skin:    General: Skin is warm and dry.     Capillary Refill: Capillary refill takes less than 2 seconds.  Neurological:     General: No focal deficit present.     Mental Status: He is alert and oriented to person, place, and time.  Psychiatric:        Mood and Affect: Mood normal.        Behavior: Behavior normal.      ASSESSMENT & PLAN: A total of 46 minutes was spent with the patient and counseling/coordination of care regarding preparing for this visit, review of most recent office visit notes, review of multiple chronic medical conditions and their management, review of all medications, cardiovascular risks associated with hypertension, diagnosis of anemia and need for workup, review of most recent bloodwork results, review of health maintenance items, education on nutrition, prognosis, documentation, and need for follow up.   Problem List Items Addressed This Visit       Cardiovascular and Mediastinum   Primary hypertension - Primary   Well-controlled hypertension. Continue amlodipine  10 mg and valsartan  160 mg daily Cardiovascular risks associated with hypertension discussed Diet  and nutrition discussed.      Relevant Orders    Comprehensive metabolic panel   Lipid panel     Endocrine   Hypothyroid   Clinically euthyroid Normal TSH last May Continues Synthroid  200 mcg daily TSH done today      Relevant Orders   TSH     Musculoskeletal and Integument   Chronic idiopathic gout involving toe of left foot without tophus   Clinically stable.  No recent flareups. Last uric acid 5.5        Genitourinary   Stage 3a chronic kidney disease (HCC)   Stable condition. Advised to stay well-hydrated and avoid NSAIDs      Relevant Orders   Comprehensive metabolic panel     Other   Sarcoidosis   Stable and asymptomatic.      Lower urinary tract symptoms   Much improved with tamsulosin  0.4 mg daily.      Relevant Orders   PSA   Anemia   Recent onset.  Asymptomatic and clinically stable No clinical signs of GI bleed. Colonoscopy report from 2023 reviewed.  No diverticulosis Precancerous polyps and internal hemorrhoids No rectal bleeding or melena.  Clinically stable. Will do blood work today Anemia panel requested      Relevant Orders   Vitamin B12   Folate   Iron and TIBC   Ferritin   CBC with Differential/Platelet   Other Visit Diagnoses       Need for vaccination       Relevant Orders   Flu Vaccine Trivalent High Dose (Fluad) (Completed)      Patient Instructions  Anemia  Anemia is a condition in which there are not enough red blood cells or hemoglobin in the blood. Hemoglobin is a substance in red blood cells that carries oxygen. When you do not have enough red blood cells or hemoglobin (are anemic), your body cannot get enough oxygen, and your organs may not work properly. As a result, you may feel very tired or have other problems. What are the causes? Common causes of anemia include: Excessive bleeding. Anemia can be caused by excessive bleeding inside or outside the body, including bleeding from the intestines or from heavy menstrual periods in females. Poor  nutrition. Long-lasting (chronic) kidney, thyroid , and liver disease. Bone marrow disorders, spleen problems, and blood disorders. Cancer and treatments for cancer. Human immunodeficiency virus (HIV) and acquired immunodeficiency syndrome (AIDS). Infections, medicines, and autoimmune disorders that destroy red blood cells. What are the signs or symptoms? Symptoms of this condition include: Minor weakness. Dizziness. Headache, or difficulties concentrating and sleeping. Heartbeats that feel irregular or faster than normal (palpitations). Shortness of breath, especially with exercise. Pale skin, lips, and nails, or cold hands and feet. Upset stomach (indigestion) and nausea. Symptoms may occur suddenly or develop slowly. If your anemia is mild, you may not have symptoms. How is this diagnosed? This condition is diagnosed based on blood tests, your medical history, and a physical exam. In some cases, a test may be needed in which cells are removed from the soft tissue inside of a bone and looked at under a microscope (bone marrow biopsy). Your health care provider may also check your stool (feces) for blood and may do more testing to look for the cause of your bleeding. Other tests may include: Imaging tests, such as a CT scan or MRI. A procedure to see inside your esophagus and stomach (endoscopy). The esophagus is the part of the body that moves food from  your mouth to your stomach. A procedure to see inside your colon and rectum (colonoscopy). How is this treated? Treatment for this condition depends on the cause. If you continue to lose a lot of blood, you may need to be treated at a hospital. Treatment may include: Taking supplements of iron, vitamin B12, or folic acid . Taking a hormone medicine (erythropoietin) that can help to stimulate red blood cell growth. Receiving donated blood through an IV (blood transfusion). This may be needed if you lose a lot of blood. Making changes to your  diet. Having surgery to remove your spleen. Follow these instructions at home: Take over-the-counter and prescription medicines only as told by your health care provider. Take supplements only as told by your health care provider. Follow any diet instructions that you were given by your health care provider. Keep all follow-up visits. Your health care provider will want to recheck your blood tests. Contact a health care provider if: You develop new bleeding anywhere in the body. You are very weak. Get help right away if: You are short of breath. You have pain in your abdomen or chest. You are dizzy or feel faint. You have trouble concentrating. You have bloody stools, black stools, or tarry stools. You vomit repeatedly or you vomit up blood. These symptoms may be an emergency. Get help right away. Call 911. Do not wait to see if the symptoms will go away. Do not drive yourself to the hospital. Summary Anemia is a condition in which you do not have enough red blood cells or enough of a substance in your red blood cells that carries oxygen. Symptoms may occur suddenly or develop slowly. If your anemia is mild, you may not have symptoms. This condition is diagnosed with blood tests, a medical history, and a physical exam. Other tests may be needed. Treatment for this condition depends on the cause of the anemia. This information is not intended to replace advice given to you by your health care provider. Make sure you discuss any questions you have with your health care provider. Document Revised: 05/23/2021 Document Reviewed: 05/23/2021 Elsevier Patient Education  2024 [employer]      Emil Schaumann, MD [facility]

## 2023-03-20 ENCOUNTER — Other Ambulatory Visit: Payer: Self-pay | Admitting: Emergency Medicine

## 2023-03-20 DIAGNOSIS — E039 Hypothyroidism, unspecified: Secondary | ICD-10-CM

## 2023-03-20 LAB — IRON AND TIBC
Iron Saturation: 11 % — ABNORMAL LOW (ref 15–55)
Iron: 37 ug/dL — ABNORMAL LOW (ref 38–169)
Total Iron Binding Capacity: 337 ug/dL (ref 250–450)
UIBC: 300 ug/dL (ref 111–343)

## 2023-03-20 MED ORDER — LEVOTHYROXINE SODIUM 175 MCG PO TABS: 175.0000 ug | ORAL_TABLET | Freq: Every day | ORAL | 3 refills | Status: DC

## 2023-03-21 NOTE — Progress Notes (Signed)
 PATIENT: Philip Woodard DOB: Feb 05, 1951  REASON FOR VISIT: follow up HISTORY FROM: patient PRIMARY NEUROLOGIST: Dr. Buck  Chief Complaint  Patient presents with   Follow-up    Pt in 19, here alone  Pt is here for follow up on OSA with CPAP. Pt states he is doing well. No concerns.      HISTORY OF PRESENT ILLNESS: Today 03/21/23:  Philip Woodard is a 73 y.o. male with a history of OSA on CPAP. Returns today for follow-up.  Reports that CPAP is working well.  He does not like to sleep without it.  He states that this is the only machine he has ever had.  And the power cord is not connecting easily.  His download is below     03/16/22: Philip Woodard is a 73 y.o. male with a history of OSA on CPAP. Returns today for follow-up.  Reports that the CPAP is working well for him.  States that he continues to notice the benefit.  He does feel the mask leaking. Wihtin the last year, he did have mask refitting. He feels that the straps should be tighter.  Changes out his supplies regularly.  His download is below       03/16/21 Mr. Philip Woodard is a 73 year old male with a history of obstructive sleep apnea on CPAP.  He returns today for follow-up.  He reports that the CPAP continues to work well for him.  He reports that he does notice the mask leaking at night.  He does change out his supplies regularly.  He returns today for an evaluation.    HISTORY (copied from Dr. Obie note) Mr. Philip Woodard is a 73 year old right-handed gentleman with an underlying medical history of sarcoidosis, hypothyroidism, arthritis, allergic rhinitis, and borderline obesity, who was previously diagnosed with obstructive sleep apnea and placed on CPAP therapy.  Prior sleep study results are not available for my review today.  I reviewed your office note from 01/15/2020.  His Epworth sleepiness score is 10 of 24, fatigue severity score is 11 out of 63. He has benefited from treatment.  He is fully compliant with his  AutoPap.  He reports that he had a sleep study originally at [facility] in 2000.  He had a home sleep test some for 5 years ago he recalls.  His current machine works well.  He does use a full facemask, he brought his machine and his mask, he uses a large F 20 fullface mask.  He does notice the leak at times.  He also has mouth dryness.  He generally goes to bed between 11 and midnight and rise time is between 530 and 6.  He denies any recurrent morning headaches or nighttime nocturia.  He is married and lives with his wife.  He is semiretired.  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  They have 2 grown daughters, he has one 63-year-old granddaughter as well.  His older daughter lives in [place], younger daughter in [place] .  Younger daughter has sleep apnea, he has a brother with sleep apnea as well.  He is a non-smoker and drinks alcohol rarely, maybe once a month and no day-to-day caffeine, occasional coffee during the week. I was able to review his CPAP compliance data from 02/15/2020 through 03/15/2020, which is a total of 30 days, during which time he used his machine every night, with percent use days greater than 4 hours at 97%, indicating excellent  compliance with an average usage of 6 hours and 6 minutes, residual AHI at goal at 3/h, 95th percentile of pressure at 13.6 cm with a range of 4 to 16 cm with EPR.  He is on AutoPap, he has an air sense 10 auto machine from [employer], set up date according to online records was 10/28/2015.  His air leakage is on the high side fairly consistently with the 95th percentile at 50.8 L/min.  His DME company is [employer].  REVIEW OF SYSTEMS: Out of a complete 14 system review of symptoms, the patient complains only of the following symptoms, and all other reviewed systems are negative.  ESS 9  ALLERGIES: Allergies  Allergen Reactions   Other     SEAFOOD   Shellfish Allergy  Swelling    Tongue and facial  Swelling Tongue and facial Swelling    HOME MEDICATIONS: Outpatient Medications Prior to Visit  Medication Sig Dispense Refill   acetaminophen  (TYLENOL ) 650 MG CR tablet Take 650 mg by mouth every 8 (eight) hours as needed.     acyclovir  (ZOVIRAX ) 800 MG tablet Take 1 tablet (800 mg total) by mouth daily. 90 tablet 0   amLODipine  (NORVASC ) 10 MG tablet TAKE 1 TABLET BY MOUTH EVERY DAY 90 tablet 1   cholecalciferol (VITAMIN D3) 25 MCG (1000 UT) tablet Take 1,000 Units by mouth daily.     gabapentin  (NEURONTIN ) 300 MG capsule Take 1 capsule (300 mg total) by mouth 3 (three) times daily. 90 capsule 1   Glucosamine-Chondroit-Vit C-Mn (GLUCOSAMINE 1500 COMPLEX) CAPS Take by mouth daily.     hydroxychloroquine  (PLAQUENIL ) 200 MG tablet TAKE 1 TABLET BY MOUTH TWICE A DAY 180 tablet 1   levothyroxine  (SYNTHROID ) 175 MCG tablet Take 1 tablet (175 mcg total) by mouth daily. 90 tablet 3   Multiple Vitamin (MULTIVITAMIN) LIQD Take 5 mLs by mouth daily.     Omega-3 Fatty Acids (FISH OIL) 1000 MG CAPS Take 2 capsules by mouth daily.     pantoprazole  (PROTONIX ) 40 MG tablet Take 1 tablet (40 mg total) by mouth daily. 90 tablet 4   sildenafil  (VIAGRA ) 100 MG tablet TAKE 1 TABLET BY MOUTH EVERY DAY AS NEEDED FOR ERECTILE DYSFUNCTION 10 tablet 0   tamsulosin  (FLOMAX ) 0.4 MG CAPS capsule Take 1 capsule (0.4 mg total) by mouth daily. 90 capsule 1   Turmeric 500 MG TABS Take 1 tablet by mouth 2 (two) times daily.     valsartan  (DIOVAN ) 160 MG tablet TAKE 1 TABLET BY MOUTH EVERY DAY 90 tablet 3   zinc gluconate 50 MG tablet Take 50 mg by mouth daily.     No facility-administered medications prior to visit.    PAST MEDICAL HISTORY: Past Medical History:  Diagnosis Date   Alkaline phosphatase raised 06/28/2011   Fluctuating - suspect due to sarcoidosis   Allergic rhinitis    Allergy     Arthritis    knees   Genital herpes    GERD (gastroesophageal reflux disease)    Hemorrhoids    Hyperglycemia     Hyperplastic lymph node 06/27/2011   Submental node excised 1/02; regrowth: re-excision 10/06   Hypertension    Hypothyroidism    Multinodular goiter    Rheumatoid arteritis (HCC)    Routine general medical examination at a health care facility    Sarcoidosis    Sleep apnea    wears cpap     PAST SURGICAL HISTORY: Past Surgical History:  Procedure Laterality Date   COLONOSCOPY  5-23/2003  COLONOSCOPY  02/04/2018   TA   COLONOSCOPY WITH PROPOFOL  05/26/2021   Gwendlyn Buddy at [facility]   POLYPECTOMY     ROTATOR CUFF REPAIR Bilateral [id number]   THYROIDECTOMY  2002    FAMILY HISTORY: Family History  Problem Relation Age of Onset   Hypertension Mother    Dementia Father    Sleep apnea Brother    Healthy Brother    Sleep apnea Paternal Aunt    Asthma Cousin    Healthy Daughter    Healthy Daughter    Sarcoidosis Neg Hx    Colon polyps Neg Hx    Colon cancer Neg Hx    Esophageal cancer Neg Hx    Stomach cancer Neg Hx    Rectal cancer Neg Hx     SOCIAL HISTORY: Social History   Socioeconomic History   Marital status: Married    Spouse name: Not on file   Number of children: 2   Years of education: Not on file   Highest education level: Not on file  Occupational History   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  Other Topics Concern   Not on file  Social History Narrative   Not on file   Social Drivers of Health   Financial Resource Strain: Low Risk  (04/25/2022)   Overall Financial Resource Strain (CARDIA)    Difficulty of Paying Living Expenses: Not hard at all  Food Insecurity: No Food Insecurity (04/04/2021)   Hunger Vital Sign    Worried About Running Out of Food in the Last Year: Never true     Ran Out of Food in the Last Year: Never true  Transportation Needs: No Transportation Needs (04/25/2022)   PRAPARE - Administrator, Civil Service (Medical): No    Lack of Transportation (Non-Medical): No  Physical Activity: Sufficiently Active (04/25/2022)   Exercise Vital Sign    Days of Exercise per Week: 5 days    Minutes of Exercise per Session: 60 min  Stress: No Stress Concern Present (04/25/2022)   [employer] of Occupational Health - Occupational Stress Questionnaire    Feeling of Stress : Not at all  Social Connections: Moderately Integrated (04/25/2022)   Social Connection and Isolation Panel [NHANES]    Frequency of Communication with Friends and Family: Twice a week    Frequency of Social Gatherings with Friends and Family: Once a week    Attends Religious Services: More than 4 times per year    Active Member of [employer] or Organizations: No    Attends Banker Meetings: Never    Marital Status: Married  Catering Manager Violence: Not At Risk (04/25/2022)   Humiliation, Afraid, Rape, and Kick questionnaire    Fear of Current or Ex-Partner: No    Emotionally Abused: No    Physically Abused: No    Sexually Abused: No      PHYSICAL EXAM  Vitals:   03/22/23 0828  BP: 124/70  Pulse: 84  Weight: 207 lb (93.9 kg)  Height: 5' 7 (1.702 m)    Body mass index is 32.42 kg/m.  Generalized: Well developed, in no acute distress  Chest: Lungs clear to  auscultation bilaterally  Neurological examination  Mentation: Alert oriented to time, place, history taking. Follows all commands speech and language fluent Cranial nerve II-XII: Facial symmetry noted  DIAGNOSTIC DATA (LABS, IMAGING, TESTING) - I reviewed patient records, labs, notes, testing and imaging myself where available.  Lab Results  Component Value Date   WBC 7.7 03/19/2023   HGB 12.8 (L) 03/19/2023   HCT 39.0 03/19/2023   MCV 88.2 03/19/2023   PLT 275.0 03/19/2023      Component  Value Date/Time   NA 140 03/19/2023 1523   K 4.4 03/19/2023 1523   CL 104 03/19/2023 1523   CO2 30 03/19/2023 1523   GLUCOSE 81 03/19/2023 1523   BUN 20 03/19/2023 1523   CREATININE 1.30 03/19/2023 1523   CREATININE 1.39 (H) 02/12/2023 1546   CALCIUM 9.8 03/19/2023 1523   PROT 7.0 03/19/2023 1523   PROT 6.7 08/10/2022 0900   ALBUMIN 4.4 03/19/2023 1523   AST 24 03/19/2023 1523   AST 28 10/05/2022 1132   ALT 17 03/19/2023 1523   ALT 19 10/05/2022 1132   ALKPHOS 91 03/19/2023 1523   BILITOT 0.8 03/19/2023 1523   BILITOT 0.8 10/05/2022 1132   GFRNONAA >60 10/05/2022 1132   GFRAA  02/20/2009 0336    >60        The eGFR has been calculated using the MDRD equation. This calculation has not been validated in all clinical situations. eGFR's persistently <60 mL/min signify possible Chronic Kidney Disease.   Lab Results  Component Value Date   CHOL 144 03/19/2023   HDL 47.60 03/19/2023   LDLCALC 80 03/19/2023   TRIG 82.0 03/19/2023   CHOLHDL 3 03/19/2023   Lab Results  Component Value Date   HGBA1C 5.6 08/10/2022   Lab Results  Component Value Date   VITAMINB12 1,173 (H) 03/19/2023   Lab Results  Component Value Date   TSH 0.14 (L) 03/19/2023      ASSESSMENT AND PLAN 73 y.o. year old male  has a past medical history of Alkaline phosphatase raised (06/28/2011), Allergic rhinitis, Allergy , Arthritis, Genital herpes, GERD (gastroesophageal reflux disease), Hemorrhoids, Hyperglycemia, Hyperplastic lymph node (06/27/2011), Hypertension, Hypothyroidism, Multinodular goiter, Rheumatoid arteritis (HCC), Routine general medical examination at a health care facility, Sarcoidosis, and Sleep apnea. here with:  OSA on CPAP  - CPAP compliance excellent - Good treatment of AHI  - Encourage patient to use CPAP nightly and > 4 hours each night - Order placed for HST- pending results will order new machine  - F/U after HST   Duwaine Russell, MSN, NP-C 03/21/2023, 3:09 PM [facility] [address], [address] [place], [place] [address] [phone]

## 2023-03-22 ENCOUNTER — Encounter: Payer: Self-pay | Admitting: Adult Health

## 2023-03-22 ENCOUNTER — Ambulatory Visit: Payer: Medicare Other | Admitting: Adult Health

## 2023-03-22 VITALS — BP 124/70 | HR 84 | Ht 67.0 in | Wt 207.0 lb

## 2023-03-22 DIAGNOSIS — G4733 Obstructive sleep apnea (adult) (pediatric): Secondary | ICD-10-CM

## 2023-03-22 NOTE — Patient Instructions (Signed)
 Continue using CPAP nightly and greater than 4 hours each night Home sleep ordered. Will order new machine pending results.  If your symptoms worsen or you develop new symptoms please let us know.

## 2023-03-26 ENCOUNTER — Inpatient Hospital Stay: Payer: Medicare Other | Attending: Hematology and Oncology | Admitting: Hematology and Oncology

## 2023-03-26 ENCOUNTER — Encounter: Payer: Self-pay | Admitting: Hematology and Oncology

## 2023-03-26 ENCOUNTER — Inpatient Hospital Stay: Payer: Medicare Other

## 2023-03-26 VITALS — BP 138/61 | HR 86 | Temp 98.5°F | Resp 16 | Wt 203.9 lb

## 2023-03-26 DIAGNOSIS — K429 Umbilical hernia without obstruction or gangrene: Secondary | ICD-10-CM | POA: Insufficient documentation

## 2023-03-26 DIAGNOSIS — M1990 Unspecified osteoarthritis, unspecified site: Secondary | ICD-10-CM | POA: Insufficient documentation

## 2023-03-26 DIAGNOSIS — D472 Monoclonal gammopathy: Secondary | ICD-10-CM

## 2023-03-26 DIAGNOSIS — C9000 Multiple myeloma not having achieved remission: Secondary | ICD-10-CM | POA: Insufficient documentation

## 2023-03-26 DIAGNOSIS — D869 Sarcoidosis, unspecified: Secondary | ICD-10-CM | POA: Insufficient documentation

## 2023-03-26 DIAGNOSIS — Z825 Family history of asthma and other chronic lower respiratory diseases: Secondary | ICD-10-CM | POA: Insufficient documentation

## 2023-03-26 DIAGNOSIS — E785 Hyperlipidemia, unspecified: Secondary | ICD-10-CM | POA: Diagnosis not present

## 2023-03-26 DIAGNOSIS — M109 Gout, unspecified: Secondary | ICD-10-CM | POA: Diagnosis not present

## 2023-03-26 DIAGNOSIS — I10 Essential (primary) hypertension: Secondary | ICD-10-CM | POA: Diagnosis not present

## 2023-03-26 DIAGNOSIS — Z8249 Family history of ischemic heart disease and other diseases of the circulatory system: Secondary | ICD-10-CM | POA: Diagnosis not present

## 2023-03-26 DIAGNOSIS — G4730 Sleep apnea, unspecified: Secondary | ICD-10-CM | POA: Insufficient documentation

## 2023-03-26 DIAGNOSIS — Z818 Family history of other mental and behavioral disorders: Secondary | ICD-10-CM | POA: Insufficient documentation

## 2023-03-26 DIAGNOSIS — Z9089 Acquired absence of other organs: Secondary | ICD-10-CM | POA: Insufficient documentation

## 2023-03-26 DIAGNOSIS — M353 Polymyalgia rheumatica: Secondary | ICD-10-CM | POA: Diagnosis not present

## 2023-03-26 DIAGNOSIS — Z8719 Personal history of other diseases of the digestive system: Secondary | ICD-10-CM | POA: Diagnosis not present

## 2023-03-26 DIAGNOSIS — Z79899 Other long term (current) drug therapy: Secondary | ICD-10-CM | POA: Diagnosis not present

## 2023-03-26 DIAGNOSIS — E039 Hypothyroidism, unspecified: Secondary | ICD-10-CM | POA: Insufficient documentation

## 2023-03-26 NOTE — Progress Notes (Signed)
 [facility] CONSULT NOTE  Patient Care Team: Purcell Emil Schanz, MD as PCP - General (Internal Medicine) Darlean Ozell NOVAK, MD (Pulmonary Disease) Kimbrough, Dwane CHRISTELLA Raddle., MD as Attending Physician (Urology)  CHIEF COMPLAINTS/PURPOSE OF CONSULTATION:  MGUS  ASSESSMENT & PLAN:  This is a very pleasant 73 yr male with Ig G kappa MGUS referred to hematology.  He is clinically asymptomatic.  We have discussed the following findings about MGUS.  Monoclonal Gammopathy of Undetermined Significance (MGUS) Stable condition with no new symptoms. Annual monitoring for progression to multiple myeloma. -Order myeloma labs today. -Follow-up in one year unless changes occur.  Hernia Small umbilical hernia noted on physical exam,  -No immediate intervention discussed.  Hyperlipidemia Well controlled based on recent lipid panel. -Continue current management.  Hypothyroidism Dose adjustment recently made by primary care physician. -Continue current management.  HISTORY OF PRESENTING ILLNESS:  Philip Woodard 73 y.o. male is here because of  MGUS   Discussed the use of AI scribe software for clinical note transcription with the patient, who gave verbal consent to proceed.  History of Present Illness    This is a very pleasant 73 year old male patient with past medical history significant for gout, osteoarthritis, sarcoidosis, polymyalgia rheumatica seen by Dr. Dolphus in dermatology referred to hematology for IgG kappa MGUS.   The patient, with a history of monoclonal gammopathy of undetermined significance (MGUS), presents for his annual check-up. He reports no significant changes in his health status since his last visit. He denies any new symptoms, including fevers, night sweats, changes in appetite or weight, breathing difficulties, bowel or urinary issues, or leg swelling. He has a small hernia, which has been causing some discomfort. He also has a history of thyroid   issues, for which his medication dose has recently been adjusted.  Rest of the pertinent 10 point ROS reviewed and negative  MEDICAL HISTORY:  Past Medical History:  Diagnosis Date   Alkaline phosphatase raised 06/28/2011   Fluctuating - suspect due to sarcoidosis   Allergic rhinitis    Allergy     Arthritis    knees   Genital herpes    GERD (gastroesophageal reflux disease)    Hemorrhoids    Hyperglycemia    Hyperplastic lymph node 06/27/2011   Submental node excised 1/02; regrowth: re-excision 10/06   Hypertension    Hypothyroidism    Multinodular goiter    Rheumatoid arteritis (HCC)    Routine general medical examination at a health care facility    Sarcoidosis    Sleep apnea    wears cpap     SURGICAL HISTORY: Past Surgical History:  Procedure Laterality Date   COLONOSCOPY  5-23/2003   COLONOSCOPY  02/04/2018   TA   COLONOSCOPY WITH PROPOFOL  05/26/2021   Gwendlyn Buddy at [facility]   POLYPECTOMY     ROTATOR CUFF REPAIR Bilateral [id number]   THYROIDECTOMY  2002    SOCIAL HISTORY: Social History   Socioeconomic History   Marital status: Married    Spouse name: Not on file   Number of children: 2   Years of education: Not on file   Highest education level: Not on file  Occupational History   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  Tobacco Use   Smoking status: Never    Passive exposure: Never   Smokeless tobacco: Never  Vaping Use   Vaping status: Never Used  Substance and Sexual Activity   Alcohol use: Yes    Comment: 3 times per month  Drug use: Never   Sexual activity: Not on file  Other Topics Concern   Not on file  Social History Narrative   Not on file   Social Drivers of Health   Financial Resource Strain: Low Risk  (04/25/2022)   Overall Financial Resource Strain (CARDIA)    Difficulty of Paying Living Expenses: Not hard at all  Food Insecurity: No Food Insecurity (04/04/2021)   Hunger Vital Sign    Worried About Running Out of Food in the  Last Year: Never true    Ran Out of Food in the Last Year: Never true  Transportation Needs: No Transportation Needs (04/25/2022)   PRAPARE - Administrator, Civil Service (Medical): No    Lack of Transportation (Non-Medical): No  Physical Activity: Sufficiently Active (04/25/2022)   Exercise Vital Sign    Days of Exercise per Week: 5 days    Minutes of Exercise per Session: 60 min  Stress: No Stress Concern Present (04/25/2022)   [employer] of Occupational Health - Occupational Stress Questionnaire    Feeling of Stress : Not at all  Social Connections: Moderately Integrated (04/25/2022)   Social Connection and Isolation Panel [NHANES]    Frequency of Communication with Friends and Family: Twice a week    Frequency of Social Gatherings with Friends and Family: Once a week    Attends Religious Services: More than 4 times per year    Active Member of [employer] or Organizations: No    Attends Banker Meetings: Never    Marital Status: Married  Catering Manager Violence: Not At Risk (04/25/2022)   Humiliation, Afraid, Rape, and Kick questionnaire    Fear of Current or Ex-Partner: No    Emotionally Abused: No    Physically Abused: No    Sexually Abused: No    FAMILY HISTORY: Family History  Problem Relation Age of Onset   Hypertension Mother    Dementia Father    Sleep apnea Brother    Healthy Brother    Sleep apnea Paternal Aunt    Asthma Cousin    Healthy Daughter    Healthy Daughter    Sarcoidosis Neg Hx    Colon polyps Neg Hx    Colon cancer Neg Hx    Esophageal cancer Neg Hx    Stomach cancer Neg Hx    Rectal cancer Neg Hx     ALLERGIES:  is allergic to other and shellfish allergy .  MEDICATIONS:  Current Outpatient Medications  Medication Sig Dispense Refill   acetaminophen  (TYLENOL ) 650 MG CR tablet Take 650 mg by mouth every 8 (eight) hours as needed.     acyclovir  (ZOVIRAX ) 800 MG tablet Take 1 tablet (800 mg total) by mouth daily. 90  tablet 0   amLODipine  (NORVASC ) 10 MG tablet TAKE 1 TABLET BY MOUTH EVERY DAY 90 tablet 1   cholecalciferol (VITAMIN D3) 25 MCG (1000 UT) tablet Take 1,000 Units by mouth daily.     gabapentin  (NEURONTIN ) 300 MG capsule Take 1 capsule (300 mg total) by mouth 3 (three) times daily. 90 capsule 1   Glucosamine-Chondroit-Vit C-Mn (GLUCOSAMINE 1500 COMPLEX) CAPS Take by mouth daily.     hydroxychloroquine  (PLAQUENIL ) 200 MG tablet TAKE 1 TABLET BY MOUTH TWICE A DAY 180 tablet 1   levothyroxine  (SYNTHROID ) 175 MCG tablet Take 1 tablet (175 mcg total) by mouth daily. 90 tablet 3   Multiple Vitamin (MULTIVITAMIN) LIQD Take 5 mLs by mouth daily.     Omega-3 Fatty Acids (FISH  OIL) 1000 MG CAPS Take 2 capsules by mouth daily.     pantoprazole  (PROTONIX ) 40 MG tablet Take 1 tablet (40 mg total) by mouth daily. 90 tablet 4   sildenafil  (VIAGRA ) 100 MG tablet TAKE 1 TABLET BY MOUTH EVERY DAY AS NEEDED FOR ERECTILE DYSFUNCTION 10 tablet 0   tamsulosin  (FLOMAX ) 0.4 MG CAPS capsule Take 1 capsule (0.4 mg total) by mouth daily. 90 capsule 1   Turmeric 500 MG TABS Take 1 tablet by mouth 2 (two) times daily.     valsartan  (DIOVAN ) 160 MG tablet TAKE 1 TABLET BY MOUTH EVERY DAY 90 tablet 3   zinc gluconate 50 MG tablet Take 50 mg by mouth daily.     No current facility-administered medications for this visit.     PHYSICAL EXAMINATION: ECOG PERFORMANCE STATUS: 0 - Asymptomatic  Vitals:   03/26/23 1013  BP: 138/61  Pulse: 86  Resp: 16  Temp: 98.5 F (36.9 C)  SpO2: 99%    Filed Weights   03/26/23 1013  Weight: 203 lb 14.4 oz (92.5 kg)   Physical Exam Constitutional:      Appearance: Normal appearance.  Cardiovascular:     Rate and Rhythm: Normal rate and regular rhythm.     Pulses: Normal pulses.     Heart sounds: Normal heart sounds.  Pulmonary:     Effort: Pulmonary effort is normal.     Breath sounds: Normal breath sounds.  Musculoskeletal:        General: No swelling. Normal range of  motion.     Cervical back: Normal range of motion and neck supple. No rigidity.  Lymphadenopathy:     Cervical: No cervical adenopathy.  Skin:    General: Skin is warm and dry.  Neurological:     Mental Status: He is alert.      LABORATORY DATA:  I have reviewed the data as listed Lab Results  Component Value Date   WBC 7.7 03/19/2023   HGB 12.8 (L) 03/19/2023   HCT 39.0 03/19/2023   MCV 88.2 03/19/2023   PLT 275.0 03/19/2023     Chemistry      Component Value Date/Time   NA 140 03/19/2023 1523   K 4.4 03/19/2023 1523   CL 104 03/19/2023 1523   CO2 30 03/19/2023 1523   BUN 20 03/19/2023 1523   CREATININE 1.30 03/19/2023 1523   CREATININE 1.39 (H) 02/12/2023 1546      Component Value Date/Time   CALCIUM 9.8 03/19/2023 1523   ALKPHOS 91 03/19/2023 1523   AST 24 03/19/2023 1523   AST 28 10/05/2022 1132   ALT 17 03/19/2023 1523   ALT 19 10/05/2022 1132   BILITOT 0.8 03/19/2023 1523   BILITOT 0.8 10/05/2022 1132       RADIOGRAPHIC STUDIES: I have personally reviewed the radiological images as listed and agreed with the findings in the report. No results found.  All questions were answered. The patient knows to call the clinic with any problems, questions or concerns.      Amber Stalls, MD 03/26/2023 10:27 AM

## 2023-03-27 LAB — KAPPA/LAMBDA LIGHT CHAINS
Kappa free light chain: 42.9 mg/L — ABNORMAL HIGH (ref 3.3–19.4)
Kappa, lambda light chain ratio: 2.27 — ABNORMAL HIGH (ref 0.26–1.65)
Lambda free light chains: 18.9 mg/L (ref 5.7–26.3)

## 2023-03-27 LAB — BETA 2 MICROGLOBULIN, SERUM: Beta-2 Microglobulin: 1.8 mg/L (ref 0.6–2.4)

## 2023-03-28 LAB — IGG, IGA, IGM
IgA: 318 mg/dL (ref 61–437)
IgG (Immunoglobin G), Serum: 1202 mg/dL (ref 603–1613)
IgM (Immunoglobulin M), Srm: 88 mg/dL (ref 20–172)

## 2023-03-29 ENCOUNTER — Ambulatory Visit: Payer: Medicare Other | Attending: Rheumatology | Admitting: Rheumatology

## 2023-03-29 ENCOUNTER — Encounter: Payer: Self-pay | Admitting: Rheumatology

## 2023-03-29 VITALS — BP 142/76 | HR 66 | Resp 14 | Ht 67.0 in | Wt 203.0 lb

## 2023-03-29 DIAGNOSIS — M1A0720 Idiopathic chronic gout, left ankle and foot, without tophus (tophi): Secondary | ICD-10-CM | POA: Diagnosis not present

## 2023-03-29 DIAGNOSIS — R748 Abnormal levels of other serum enzymes: Secondary | ICD-10-CM | POA: Diagnosis not present

## 2023-03-29 DIAGNOSIS — M51369 Other intervertebral disc degeneration, lumbar region without mention of lumbar back pain or lower extremity pain: Secondary | ICD-10-CM | POA: Diagnosis not present

## 2023-03-29 DIAGNOSIS — Z79899 Other long term (current) drug therapy: Secondary | ICD-10-CM

## 2023-03-29 DIAGNOSIS — M2241 Chondromalacia patellae, right knee: Secondary | ICD-10-CM

## 2023-03-29 DIAGNOSIS — N1831 Chronic kidney disease, stage 3a: Secondary | ICD-10-CM | POA: Diagnosis not present

## 2023-03-29 DIAGNOSIS — M353 Polymyalgia rheumatica: Secondary | ICD-10-CM | POA: Diagnosis not present

## 2023-03-29 DIAGNOSIS — E559 Vitamin D deficiency, unspecified: Secondary | ICD-10-CM

## 2023-03-29 DIAGNOSIS — M5030 Other cervical disc degeneration, unspecified cervical region: Secondary | ICD-10-CM | POA: Diagnosis not present

## 2023-03-29 DIAGNOSIS — G4733 Obstructive sleep apnea (adult) (pediatric): Secondary | ICD-10-CM

## 2023-03-29 DIAGNOSIS — K7581 Nonalcoholic steatohepatitis (NASH): Secondary | ICD-10-CM | POA: Diagnosis not present

## 2023-03-29 DIAGNOSIS — I10 Essential (primary) hypertension: Secondary | ICD-10-CM | POA: Diagnosis not present

## 2023-03-29 DIAGNOSIS — D869 Sarcoidosis, unspecified: Secondary | ICD-10-CM | POA: Diagnosis not present

## 2023-03-29 DIAGNOSIS — Z8709 Personal history of other diseases of the respiratory system: Secondary | ICD-10-CM

## 2023-03-29 DIAGNOSIS — Z8639 Personal history of other endocrine, nutritional and metabolic disease: Secondary | ICD-10-CM

## 2023-03-29 NOTE — Patient Instructions (Signed)
Standing Labs We placed an order today for your standing lab work.   Please have your standing labs drawn in June  Please have your labs drawn 2 weeks prior to your appointment so that the provider can discuss your lab results at your appointment, if possible.  Please note that you may see your imaging and lab results in MyChart before we have reviewed them. We will contact you once all results are reviewed. Please allow our office up to 72 hours to thoroughly review all of the results before contacting the office for clarification of your results.  WALK-IN LAB HOURS  Monday through Thursday from 8:00 am -12:30 pm and 1:00 pm-5:00 pm and Friday from 8:00 am-12:00 pm.  Patients with office visits requiring labs will be seen before walk-in labs.  You may encounter longer than normal wait times. Please allow additional time. Wait times may be shorter on  Monday and Thursday afternoons.  We do not book appointments for walk-in labs. We appreciate your patience and understanding with our staff.   Labs are drawn by [facility]. Please bring your co-pay at the time of your lab draw.  You may receive a bill from [facility] for your lab work.  Please note if you are on Hydroxychloroquine and and an order has been placed for a Hydroxychloroquine level,  you will need to have it drawn 4 hours or more after your last dose.  If you wish to have your labs drawn at another location, please call the office 24 hours in advance so we can fax the orders.  The office is located at [address], [address], [place], [place] [address]   If you have any questions regarding directions or hours of operation,  please call [phone].   As a reminder, please drink plenty of water prior to coming for your lab work. Thanks!   Vaccines You are taking a medication(s) that can suppress your immune system.  The following immunizations are recommended: Flu annually Covid-19  RSV Td/Tdap (tetanus, diphtheria, pertussis)  every 10 years Pneumonia (Prevnar 15 then Pneumovax 23 at least 1 year apart.  Alternatively, can take Prevnar 20 without needing additional dose) Shingrix: 2 doses from 4 weeks to 6 months apart  Please check with your PCP to make sure you are up to date.

## 2023-03-31 ENCOUNTER — Encounter: Payer: Self-pay | Admitting: Emergency Medicine

## 2023-04-02 ENCOUNTER — Other Ambulatory Visit: Payer: Self-pay | Admitting: Radiology

## 2023-04-02 ENCOUNTER — Telehealth: Payer: Self-pay

## 2023-04-02 DIAGNOSIS — R399 Unspecified symptoms and signs involving the genitourinary system: Secondary | ICD-10-CM

## 2023-04-02 DIAGNOSIS — I10 Essential (primary) hypertension: Secondary | ICD-10-CM

## 2023-04-02 DIAGNOSIS — R1310 Dysphagia, unspecified: Secondary | ICD-10-CM

## 2023-04-02 DIAGNOSIS — K219 Gastro-esophageal reflux disease without esophagitis: Secondary | ICD-10-CM

## 2023-04-02 MED ORDER — PANTOPRAZOLE SODIUM 40 MG PO TBEC: 40.0000 mg | DELAYED_RELEASE_TABLET | Freq: Every day | ORAL | 1 refills | Status: DC

## 2023-04-02 MED ORDER — AMLODIPINE BESYLATE 10 MG PO TABS: 10.0000 mg | ORAL_TABLET | Freq: Every day | ORAL | 1 refills | Status: DC

## 2023-04-02 MED ORDER — TAMSULOSIN HCL 0.4 MG PO CAPS: 0.4000 mg | ORAL_CAPSULE | Freq: Every day | ORAL | 1 refills | Status: AC

## 2023-04-02 MED ORDER — PANTOPRAZOLE SODIUM 40 MG PO TBEC
40.0000 mg | DELAYED_RELEASE_TABLET | Freq: Every day | ORAL | 0 refills | Status: DC
Start: 2023-04-02 — End: 2023-09-24

## 2023-04-02 NOTE — Telephone Encounter (Signed)
Yes, it can be refilled for 31-month supply the current dose.  Going forward, medication refills, clinical questions and follow-up for Dr. Darcella Gasman patients will be handled by Dr. Doy Hutching.  This patient needs clinic follow-up with Dr. Doy Hutching within the next 3 months.  Ellwood Dense MD

## 2023-04-02 NOTE — Telephone Encounter (Signed)
Prescription sent to patient's pharmacy.

## 2023-04-02 NOTE — Telephone Encounter (Signed)
Received fax from [facility] requesting a refill of pantoprazole 40 mg daily. This is a previous patient of Dr. Russella Dar. Dr. Myrtie Neither, you are DOD this morning. Can we refill patient's medication?

## 2023-04-05 LAB — PROTEIN ELECTROPHORESIS, SERUM, WITH REFLEX
A/G Ratio: 1.4 (ref 0.7–1.7)
Albumin ELP: 3.9 g/dL (ref 2.9–4.4)
Alpha-1-Globulin: 0.2 g/dL (ref 0.0–0.4)
Alpha-2-Globulin: 0.6 g/dL (ref 0.4–1.0)
Beta Globulin: 1 g/dL (ref 0.7–1.3)
Gamma Globulin: 1 g/dL (ref 0.4–1.8)
Globulin, Total: 2.8 g/dL (ref 2.2–3.9)
M-Spike, %: 0.4 g/dL — ABNORMAL HIGH
SPEP Interpretation: 0
Total Protein ELP: 6.7 g/dL (ref 6.0–8.5)

## 2023-04-05 LAB — IMMUNOFIXATION REFLEX, SERUM
IgA: 383 mg/dL (ref 61–437)
IgG (Immunoglobin G), Serum: 1324 mg/dL (ref 603–1613)
IgM (Immunoglobulin M), Srm: 101 mg/dL (ref 20–172)

## 2023-04-06 DIAGNOSIS — M9904 Segmental and somatic dysfunction of sacral region: Secondary | ICD-10-CM | POA: Diagnosis not present

## 2023-04-06 DIAGNOSIS — M9903 Segmental and somatic dysfunction of lumbar region: Secondary | ICD-10-CM | POA: Diagnosis not present

## 2023-04-06 DIAGNOSIS — M9902 Segmental and somatic dysfunction of thoracic region: Secondary | ICD-10-CM | POA: Diagnosis not present

## 2023-04-26 DIAGNOSIS — D472 Monoclonal gammopathy: Secondary | ICD-10-CM | POA: Diagnosis not present

## 2023-04-26 DIAGNOSIS — D869 Sarcoidosis, unspecified: Secondary | ICD-10-CM | POA: Diagnosis not present

## 2023-04-26 DIAGNOSIS — I129 Hypertensive chronic kidney disease with stage 1 through stage 4 chronic kidney disease, or unspecified chronic kidney disease: Secondary | ICD-10-CM | POA: Diagnosis not present

## 2023-04-26 DIAGNOSIS — N1831 Chronic kidney disease, stage 3a: Secondary | ICD-10-CM | POA: Diagnosis not present

## 2023-04-26 DIAGNOSIS — M109 Gout, unspecified: Secondary | ICD-10-CM | POA: Diagnosis not present

## 2023-04-27 LAB — LAB REPORT - SCANNED
Calcium: 9.2
Creatinine, POC: 159.1 mg/dL
EGFR: 60

## 2023-04-30 ENCOUNTER — Ambulatory Visit (INDEPENDENT_AMBULATORY_CARE_PROVIDER_SITE_OTHER): Payer: Medicare Other

## 2023-04-30 VITALS — Ht 67.0 in | Wt 203.0 lb

## 2023-04-30 DIAGNOSIS — Z0000 Encounter for general adult medical examination without abnormal findings: Secondary | ICD-10-CM | POA: Diagnosis not present

## 2023-04-30 DIAGNOSIS — H9193 Unspecified hearing loss, bilateral: Secondary | ICD-10-CM

## 2023-04-30 NOTE — Progress Notes (Signed)
 Subjective:   Philip Woodard is a 73 y.o. male who presents for Medicare Annual/Subsequent preventive examination.  Visit Complete: Virtual I connected with  Philip Woodard on 04/30/23 by a audio enabled telemedicine application and verified that I am speaking with the correct person using two identifiers.  Patient Location: Home  Provider Location: Office/Clinic  I discussed the limitations of evaluation and management by telemedicine. The patient expressed understanding and agreed to proceed.  Vital Signs: Because this visit was a virtual/telehealth visit, some criteria may be missing or patient reported. Any vitals not documented were not able to be obtained and vitals that have been documented are patient reported.  Cardiac Risk Factors include: advanced age (>44men, >89 women);hypertension;male gender;obesity (BMI >30kg/m2)     Objective:    Today's Vitals   04/30/23 1407  Weight: 203 lb (92.1 kg)  Height: 5\' 7"  (1.702 m)   Body mass index is 31.79 kg/m.     04/30/2023    2:05 PM 04/25/2022    2:14 PM 11/25/2021    9:15 AM 04/04/2021    8:14 AM 01/16/2020    8:37 AM 12/12/2018    9:30 AM 12/24/2017    1:08 PM  Advanced Directives  Does Patient Have a Medical Advance Directive? Yes No No No Yes No No  Type of Estate agent of [place];Living will    Healthcare Power of [place];Living will    Does patient want to make changes to medical advance directive?     No - Patient declined    Copy of Healthcare Power of Attorney in Chart? No - copy requested    No - copy requested    Would patient like information on creating a medical advance directive?  Yes (ED - Information included in AVS) No - Patient declined No - Patient declined       Current Medications (verified) Outpatient Encounter Medications as of 04/30/2023  Medication Sig   acetaminophen (TYLENOL) 650 MG CR tablet Take 650 mg by mouth every 8 (eight) hours as needed.   acyclovir (ZOVIRAX)  800 MG tablet Take 1 tablet (800 mg total) by mouth daily.   amLODipine (NORVASC) 10 MG tablet Take 1 tablet (10 mg total) by mouth daily.   cholecalciferol (VITAMIN D3) 25 MCG (1000 UT) tablet Take 1,000 Units by mouth daily.   Cyanocobalamin (VITAMIN B-12 PO) Take by mouth.   gabapentin (NEURONTIN) 300 MG capsule Take 1 capsule (300 mg total) by mouth 3 (three) times daily.   Glucosamine-Chondroit-Vit C-Mn (GLUCOSAMINE 1500 COMPLEX) CAPS Take by mouth daily.   hydroxychloroquine (PLAQUENIL) 200 MG tablet TAKE 1 TABLET BY MOUTH TWICE A DAY   levothyroxine (SYNTHROID) 175 MCG tablet Take 1 tablet (175 mcg total) by mouth daily.   Multiple Vitamin (MULTIVITAMIN) LIQD Take 5 mLs by mouth daily.   Omega-3 Fatty Acids (FISH OIL) 1000 MG CAPS Take 2 capsules by mouth daily.   pantoprazole (PROTONIX) 40 MG tablet Take 1 tablet (40 mg total) by mouth daily.   sildenafil (VIAGRA) 100 MG tablet TAKE 1 TABLET BY MOUTH EVERY DAY AS NEEDED FOR ERECTILE DYSFUNCTION   tamsulosin (FLOMAX) 0.4 MG CAPS capsule Take 1 capsule (0.4 mg total) by mouth daily.   Turmeric 500 MG TABS Take 1 tablet by mouth 2 (two) times daily.   valsartan (DIOVAN) 160 MG tablet TAKE 1 TABLET BY MOUTH EVERY DAY   zinc gluconate 50 MG tablet Take 50 mg by mouth daily.   No facility-administered encounter  medications on file as of 04/30/2023.    Allergies (verified) Other and Shellfish allergy   History: Past Medical History:  Diagnosis Date   Alkaline phosphatase raised 06/28/2011   Fluctuating - suspect due to sarcoidosis   Allergic rhinitis    Allergy    Arthritis    knees   Genital herpes    GERD (gastroesophageal reflux disease)    Hemorrhoids    Hyperglycemia    Hyperplastic lymph node 06/27/2011   Submental node excised 1/02; regrowth: re-excision 10/06   Hypertension    Hypothyroidism    Multinodular goiter    Rheumatoid arteritis (HCC)    Routine general medical examination at a health care facility     Sarcoidosis    Sleep apnea    wears cpap    Past Surgical History:  Procedure Laterality Date   COLONOSCOPY  5-23/2003   COLONOSCOPY  02/04/2018   TA   COLONOSCOPY WITH PROPOFOL  05/26/2021   [facility]   POLYPECTOMY     ROTATOR CUFF REPAIR Bilateral [id number]   THYROIDECTOMY  2002   Family History  Problem Relation Age of Onset   Hypertension Mother    Dementia Father    Sleep apnea Brother    Healthy Brother    Sleep apnea Paternal Aunt    Asthma Cousin    Healthy Daughter    Healthy Daughter    Sarcoidosis Neg Hx    Colon polyps Neg Hx    Colon cancer Neg Hx    Esophageal cancer Neg Hx    Stomach cancer Neg Hx    Rectal cancer Neg Hx    Social History   Socioeconomic History   Marital status: Married    Spouse name: Not on file   Number of children: 2   Years of education: Not on file   Highest education level: Not on file  Occupational History   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  Other Topics Concern   Not on file  Social History Narrative   Not on file   Social Drivers of Health   Financial Resource Strain: Low Risk  (04/30/2023)   Overall Financial Resource Strain (CARDIA)    Difficulty of Paying Living Expenses: Not hard at all  Food Insecurity: No Food Insecurity (04/30/2023)   Hunger Vital Sign    Worried About Running Out of Food in the Last Year: Never true    Ran Out of Food in the Last Year: Never true  Transportation Needs: No Transportation Needs (04/30/2023)   PRAPARE - Administrator, Civil Service (Medical): No    Lack of Transportation (Non-Medical): No  Physical Activity: Sufficiently Active (04/30/2023)   Exercise Vital Sign    Days of Exercise per  Week: 3 days    Minutes of Exercise per Session: 60 min  Stress: No Stress Concern Present (04/30/2023)   [employer] of Occupational Health - Occupational Stress Questionnaire    Feeling of Stress : Not at all  Social Connections: Socially Integrated (04/30/2023)   Social Connection and Isolation Panel [NHANES]    Frequency of Communication with Friends and Family: More than three times a  week    Frequency of Social Gatherings with Friends and Family: Once a week    Attends Religious Services: More than 4 times per year    Active Member of [employer] or Organizations: Yes    Attends Engineer, structural: More than 4 times per year    Marital Status: Married    Tobacco Counseling Counseling given: Not Answered   Clinical Intake:  Pre-visit preparation completed: Yes  Pain : No/denies pain     BMI - recorded: 31.79 Nutritional Status: BMI > 30  Obese Nutritional Risks: None Diabetes: No  How often do you need to have someone help you when you read instructions, pamphlets, or other written materials from your doctor or pharmacy?: 1 - Never  Interpreter Needed?: No  Information entered by :: Hassell Halim, CMA   Activities of Daily Living    04/30/2023    2:09 PM  In your present state of health, do you have any difficulty performing the following activities:  Hearing? 1  Comment requesting a referral to an audiology  Vision? 0  Difficulty concentrating or making decisions? 0  Walking or climbing stairs? 0  Dressing or bathing? 0  Doing errands, shopping? 0  Preparing Food and eating ? N  Using the Toilet? N  In the past six months, have you accidently leaked urine? N  Do you have problems with loss of bowel control? N  Managing your Medications? N  Managing your Finances? N  Housekeeping or managing your Housekeeping? N    Patient Care Team: Georgina Quint, MD as PCP - General (Internal Medicine) Nyoka Cowden, MD (Pulmonary  Disease) Kimbrough, Emmaline Life., MD as Attending Physician (Urology)  Indicate any recent Medical Services you may have received from other than [facility] providers in the past year (date may be approximate).     Assessment:   This is a routine wellness examination for [place].  Hearing/Vision screen Hearing Screening - Comments:: Exp. Some difficulty - referral to an Audiologist has been ordered Vision Screening - Comments:: Wears rx glasses - up to date with routine eye exams with  Dr Cathey Endow w/[facility]    Goals Addressed               This Visit's Progress     Weight (lb) < 200 lb (90.7 kg) (pt-stated)   203 lb (92.1 kg)     Patient plans to lose weight (10-15lbs).        Depression Screen    04/30/2023    2:13 PM 03/19/2023    2:25 PM 08/31/2022    9:38 AM 04/25/2022    2:11 PM 01/25/2022    9:35 AM 10/04/2021   10:19 AM 07/20/2021    8:44 AM  PHQ 2/9 Scores  PHQ - 2 Score 0 0 0 0 0 0 0    Fall Risk    04/30/2023    2:15 PM 03/19/2023    2:25 PM 08/31/2022    9:38 AM 04/25/2022    2:08 PM 01/25/2022    9:35 AM  Fall Risk   Falls in the past year? 0 0 0 0 0  Number falls in past yr: 0 0 0 0 0  Injury with Fall? 0 0 0 0 0  Risk for fall due to : No Fall Risks No Fall Risks No Fall Risks No Fall Risks No Fall Risks  Follow up Falls evaluation completed;Falls prevention discussed Falls evaluation completed Falls evaluation completed  Falls evaluation  completed    MEDICARE RISK AT HOME: Medicare Risk at Home Any stairs in or around the home?: Yes If so, are there any without handrails?: No Home free of loose throw rugs in walkways, pet beds, electrical cords, etc?: Yes Adequate lighting in your home to reduce risk of falls?: Yes Life alert?: No Use of a cane, walker or w/c?: No Grab bars in the bathroom?: No Shower chair or bench in shower?: No Elevated toilet seat or a handicapped toilet?: Yes  TIMED UP AND GO:  Was the test performed?  No     Cognitive Function:        04/30/2023    2:16 PM 04/25/2022    2:21 PM  6CIT Screen  What Year? 0 points 0 points  What month? 0 points 0 points  What time? 0 points 0 points  Count back from 20 0 points 0 points  Months in reverse 0 points 0 points  Repeat phrase 0 points 0 points  Total Score 0 points 0 points    Immunizations Immunization History  Administered Date(s) Administered   Fluad Quad(high Dose 65+) 12/11/2018, 01/20/2020, 03/31/2021, 01/25/2022   Fluad Trivalent(High Dose 65+) 03/19/2023   Influenza Whole 12/11/2009, 12/11/2011   Influenza, High Dose Seasonal PF 11/14/2017   Influenza,inj,Quad PF,6+ Mos 01/31/2013, 12/10/2013, 02/22/2016, 12/19/2016   Moderna Sars-Covid-2 Vaccination 03/18/2020   PFIZER(Purple Top)SARS-COV-2 Vaccination 04/04/2019, 04/25/2019   PNEUMOCOCCAL CONJUGATE-20 01/25/2022   Pneumococcal Conjugate-13 11/14/2017   Pneumococcal Polysaccharide-23 01/31/2013   Td 10/23/2005   Tdap 02/22/2016   Zoster, Live 01/31/2013    TDAP status: Up to date - 02/22/2016  Flu Vaccine status: Up to date - 03/19/2023  Pneumococcal vaccine status: Up to date - 01/25/2022  Covid-19 vaccine status: Declined, Education has been provided regarding the importance of this vaccine but patient still declined. Advised may receive this vaccine at local pharmacy or Health Dept.or vaccine clinic. Aware to provide a copy of the vaccination record if obtained from local pharmacy or Health Dept. Verbalized acceptance and understanding.  Qualifies for Shingles Vaccine? Yes   Zostavax completed Yes   Shingrix Completed?: No.    Education has been provided regarding the importance of this vaccine. Patient has been advised to call insurance company to determine out of pocket expense if they have not yet received this vaccine. Advised may also receive vaccine at local pharmacy or Health Dept. Verbalized acceptance and understanding.  Screening Tests Health Maintenance   Topic Date Due   Zoster Vaccines- Shingrix (1 of 2) 06/10/1971   Medicare Annual Wellness (AWV)  04/29/2024   DTaP/Tdap/Td (3 - Td or Tdap) 02/21/2026   Colonoscopy  05/26/2028   Pneumonia Vaccine 51+ Years old  Completed   INFLUENZA VACCINE  Completed   Hepatitis C Screening  Completed   HPV VACCINES  Aged Out   COVID-19 Vaccine  Discontinued    Health Maintenance  Health Maintenance Due  Topic Date Due   Zoster Vaccines- Shingrix (1 of 2) 06/10/1971    Colorectal cancer screening: Type of screening: Colonoscopy. Completed 05/26/2021. Repeat every 7 years   Additional Screening:  Hepatitis C Screening: does qualify; Completed 05/31/2021  Vision Screening: Recommended annual ophthalmology exams for early detection of glaucoma and other disorders of the eye. Is the patient up to date with their annual eye exam?  Yes  Who is the provider or what is the name of the office in which the patient attends annual eye exams? Dr Cathey Endow of [facility] If pt  is not established with a provider, would they like to be referred to a provider to establish care? No .   Dental Screening: Recommended annual dental exams for proper oral hygiene  Community Resource Referral / Chronic Care Management: CRR required this visit?  No   CCM required this visit?  No     Plan:     I have personally reviewed and noted the following in the patient's chart:   Medical and social history Use of alcohol, tobacco or illicit drugs  Current medications and supplements including opioid prescriptions. Patient is not currently taking opioid prescriptions. Functional ability and status Nutritional status Physical activity Advanced directives List of other physicians Hospitalizations, surgeries, and ER visits in previous 12 months Vitals Screenings to include cognitive, depression, and falls Referrals and appointments  In addition, I have reviewed and discussed with patient certain preventive  protocols, quality metrics, and best practice recommendations. A written personalized care plan for preventive services as well as general preventive health recommendations were provided to patient.     Darreld Mclean, CMA   04/30/2023   After Visit Summary: (MyChart) Due to this being a telephonic visit, the after visit summary with patients personalized plan was offered to patient via MyChart   Nurse Notes: none

## 2023-04-30 NOTE — Patient Instructions (Addendum)
 Mr. Philip Woodard , Thank you for taking time to come for your Medicare Wellness Visit. I appreciate your ongoing commitment to your health goals. Please review the following plan we discussed and let me know if I can assist you in the future.   Referrals/Orders/Follow-Ups/Clinician Recommendations: Aim for 30 minutes of exercise or brisk walking, 6-8 glasses of water, and 5 servings of fruits and vegetables each day. Patient will get Shingles vaccine at local pharmacy - will upload vaccine list to MyChart.  This is a list of the screening recommended for you and due dates:  Health Maintenance  Topic Date Due   Zoster (Shingles) Vaccine (1 of 2) 06/10/1971   Medicare Annual Wellness Visit  04/29/2024   DTaP/Tdap/Td vaccine (3 - Td or Tdap) 02/21/2026   Colon Cancer Screening  05/26/2028   Pneumonia Vaccine  Completed   Flu Shot  Completed   Hepatitis C Screening  Completed   HPV Vaccine  Aged Out   COVID-19 Vaccine  Discontinued    Advanced directives: (Copy Requested) Please bring a copy of your health care power of attorney and living will to the office to be added to your chart at your convenience.  Next Medicare Annual Wellness Visit scheduled for next year: Yes - 04/2024

## 2023-05-08 ENCOUNTER — Ambulatory Visit: Payer: Medicare Other | Admitting: Neurology

## 2023-05-08 DIAGNOSIS — G4733 Obstructive sleep apnea (adult) (pediatric): Secondary | ICD-10-CM | POA: Diagnosis not present

## 2023-05-09 NOTE — Progress Notes (Signed)
 See procedure note.

## 2023-05-09 NOTE — Procedures (Signed)
 [facility]  HOME SLEEP TEST (Watch PAT) REPORT  STUDY DATE: 05/08/2023  DOB: 18-Apr-1950  MRN: [id number]  ORDERING CLINICIAN: [name], MD, PhD   REFERRING CLINICIAN: Butch Penny, NP  CLINICAL INFORMATION/HISTORY: 73 year old male with an underlying medical history of sarcoidosis, hypothyroidism, arthritis, allergic rhinitis, and borderline obesity, who presents for reevaluation of his obstructive sleep apnea of many years duration.  He is compliant with his current AutoPap machine of 4 to 16 cm with EPR of 2 with good results and good tolerance of treatment, as well as adequate apnea control.  He should qualify for new machine.  BMI: 32.4 kg/m  FINDINGS:   Sleep Summary:   Total Recording Time (hours, min): 7 hours, 50 min  Total Sleep Time (hours, min):  6 hours, 14 min  Percent REM (%):    2.8%   Respiratory Indices:   Calculated pAHI (per hour):  36.6/hour -utilizing the 4% desaturation criteria for obstructive hypopneas per Medicare guidelines         REM pAHI:    N/A       Central pAHI: 5.6/hour  Oxygen Saturation Statistics:    Oxygen Saturation (%) Mean: 94%   Minimum oxygen saturation (%):                 80%   O2 Saturation Range (%): 80 -99%    O2 Saturation (minutes) <=88%: 10.4 min  Pulse Rate Statistics:   Pulse Mean (bpm):    61/min    Pulse Range (45 - 89/min)   IMPRESSION: OSA (obstructive sleep apnea), severe  RECOMMENDATION:  This home sleep test demonstrates severe obstructive sleep apnea with a total AHI of 36.6/hour and O2 nadir of 80%. Snoring was detected, in the moderate to loud range.  Ongoing treatment with positive airway pressure is highly recommended. The patient has been a long-term AutoPap user and has an older machine, currently compliant with treatment with a minimum pressure of 4 cm, maximum pressure of 16 cm with EPR of 2.  He should qualify for a new machine.  I would recommend keeping the  settings the same but perhaps requesting a mask refit as leak from the mask has been higher on the most recent download.  A laboratory attended titration study can be considered in the future for optimization of treatment settings and to improve tolerance and compliance, if needed, down the road. Alternative treatment options are limited secondary to the severity of the patient's sleep disordered breathing, but may include surgical treatment with an implantable hypoglossal nerve stimulator (in carefully selected candidates, meeting criteria).  Concomitant weight loss is recommended (where clinically appropriate). Please note, that untreated obstructive sleep apnea may carry additional perioperative morbidity. Patients with significant obstructive sleep apnea should receive perioperative PAP therapy and the surgeons and particularly the anesthesiologist should be informed of the diagnosis and the severity of the sleep disordered breathing. The patient should be cautioned not to drive, work at heights, or operate dangerous or heavy equipment when tired or sleepy. Review and reiteration of good sleep hygiene measures should be pursued with any patient. Other causes of the patient's symptoms, including circadian rhythm disturbances, an underlying mood disorder, medication effect and/or an underlying medical problem cannot be ruled out based on this test. Clinical correlation is recommended.  The patient and his referring provider will be notified of the test results. The patient will be seen in follow up in sleep clinic at [facility].  I certify that I have reviewed  the raw data recording prior to the issuance of this report in accordance with the standards of the American Academy of Sleep Medicine (AASM).  INTERPRETING PHYSICIAN:   [name], MD, PhD Medical Director, [facility]) Diplomat, ABPN (Neurology and Sleep)   [facility] [address],  [address] [place], [place] [address] [phone]

## 2023-05-10 ENCOUNTER — Encounter: Payer: Self-pay | Admitting: Adult Health

## 2023-05-10 ENCOUNTER — Telehealth: Payer: Self-pay | Admitting: *Deleted

## 2023-05-10 NOTE — Telephone Encounter (Signed)
 New, Maryella Shivers, Otilio Jefferson, RN; Alain Honey; Jeris Penta, [place]; 1 other Received, thank you!

## 2023-05-10 NOTE — Telephone Encounter (Signed)
-----   Message from [facility] sent at 05/10/2023  4:11 AM EST ----- Order placed for new machine

## 2023-05-10 NOTE — Addendum Note (Signed)
 Addended by: Enedina Finner on: 05/10/2023 04:11 AM   Modules accepted: Orders

## 2023-05-10 NOTE — Telephone Encounter (Signed)
 I called the pt and LVM (ok per DPR) informing patient of his sleep study results and that we have sent an order for a new machine over to [facility]. I reminded the pt of the compliance requirements which includes using the machine at least 4 hours at night and also being seen by our office between 30 and 90 days after setup. I asked the patient to call us back to schedule the appt. He can either call us back now or he can wait until his machine is received.

## 2023-05-16 ENCOUNTER — Telehealth: Payer: Self-pay | Admitting: Adult Health

## 2023-05-16 NOTE — Telephone Encounter (Signed)
 Pt LVM at 1:49 pm calling to schedule an follow appointment that I'm getting a replacement CPAP machine. Patient do not have the machine.  Contacted the patient; he has not scheduled an appointment to get replacement machine. Advised patient to schedule appointment when get machine call back to schedule appointment.

## 2023-05-16 NOTE — Telephone Encounter (Signed)
 Schedule Initial CPAP on 08/21/23 at 11:30am. Informed patient to bring CPAP machine and power cord. Schedule between 06/23/23-08/22/23

## 2023-05-16 NOTE — Telephone Encounter (Signed)
 noted

## 2023-05-24 DIAGNOSIS — M9902 Segmental and somatic dysfunction of thoracic region: Secondary | ICD-10-CM | POA: Diagnosis not present

## 2023-05-24 DIAGNOSIS — M9903 Segmental and somatic dysfunction of lumbar region: Secondary | ICD-10-CM | POA: Diagnosis not present

## 2023-05-24 DIAGNOSIS — M9904 Segmental and somatic dysfunction of sacral region: Secondary | ICD-10-CM | POA: Diagnosis not present

## 2023-06-07 ENCOUNTER — Ambulatory Visit: Payer: Medicare Other | Attending: Emergency Medicine | Admitting: Audiology

## 2023-06-07 DIAGNOSIS — H903 Sensorineural hearing loss, bilateral: Secondary | ICD-10-CM | POA: Insufficient documentation

## 2023-06-08 NOTE — Procedures (Signed)
  Outpatient Audiology and [facility] [address] [place], [place]  [address] [phone]  AUDIOLOGICAL  EVALUATION  NAME: Philip Woodard     DOB:   02/16/1951      MRN: [id number]                                                                                     DATE: 06/08/2023     REFERENT: Georgina Quint, MD STATUS: Outpatient DIAGNOSIS: sensorineural hearing loss, bilateral    History: [name] was seen for an audiological evaluation due to concerns regarding his hearing sensitivity. [name] reports his wife has concerns regarding his hearing sensitivity. [name] reports he says "huh" often and asks for repetition. [name] reports sometimes he talks very loudly. [name] denies otalgia, tinnitus, aural fullness, and dizziness.   Evaluation:  Otoscopy showed a clear view of the tympanic membranes, bilaterally Tympanometry results were consistent with normal middle ear pressure and normal tympanic membrane mobility (Type A), bilaterally.  Audiometric testing was completed using Conventional Audiometry techniques with insert earphones and TDH headphones. Test results are consistent with in the right ear with normal hearing sensitivity from [phone] sloping to a moderate high frequency sensorineural hearing loss at 6000-8000 Hz. Test results are consistent in the left ear with normal hearing sensitivity from [phone] sloping to a high frequency moderately-severe sensorineural hearing loss at 6000-8000 Hz. There is an asymmetry noted, worse int he right ear. Speech Recognition Thresholds were obtained at 15 dB HL in the right ear and at 20  dB HL in the left ear. Word Recognition Testing was completed at 60 dB HL and [name] scored 100% bilaterally.    Results:  The test results were reviewed with [facility]. Test results are consistent with in the right ear with normal hearing sensitivity from [phone] sloping to a moderate high frequency sensorineural hearing loss at  6000-8000 Hz. Test results are consistent in the left ear with normal hearing sensitivity from [phone] sloping to a high frequency moderately-severe sensorineural hearing loss at 6000-8000 Hz. There is an asymmetry noted, worse int he right ear. [name] may have hearing and communication difficulty in adverse listening environments. He will benefit form the use of good communication strategies. Hearing aids are not recommended at this time.   Recommendations: 1.   Monitor hearing sensitivity. Return in 2 years for audiological monitoring.    30 minutes spent testing and counseling on results.   If you have any questions please feel free to contact me at [phone].  Marton Redwood Audiologist, Au.D., CCC-A 06/08/2023  10:29 AM  Cc: Georgina Quint, MD

## 2023-07-23 IMAGING — DX DG CHEST 2V
2 series · 2 of 2 positions shown · non-contrast
Comparison: Chest x-ray 07/13/2020.

CLINICAL DATA: 70-year-old male with history of sarcoidosis.

EXAM:
CHEST - 2 VIEW

[chest pa]
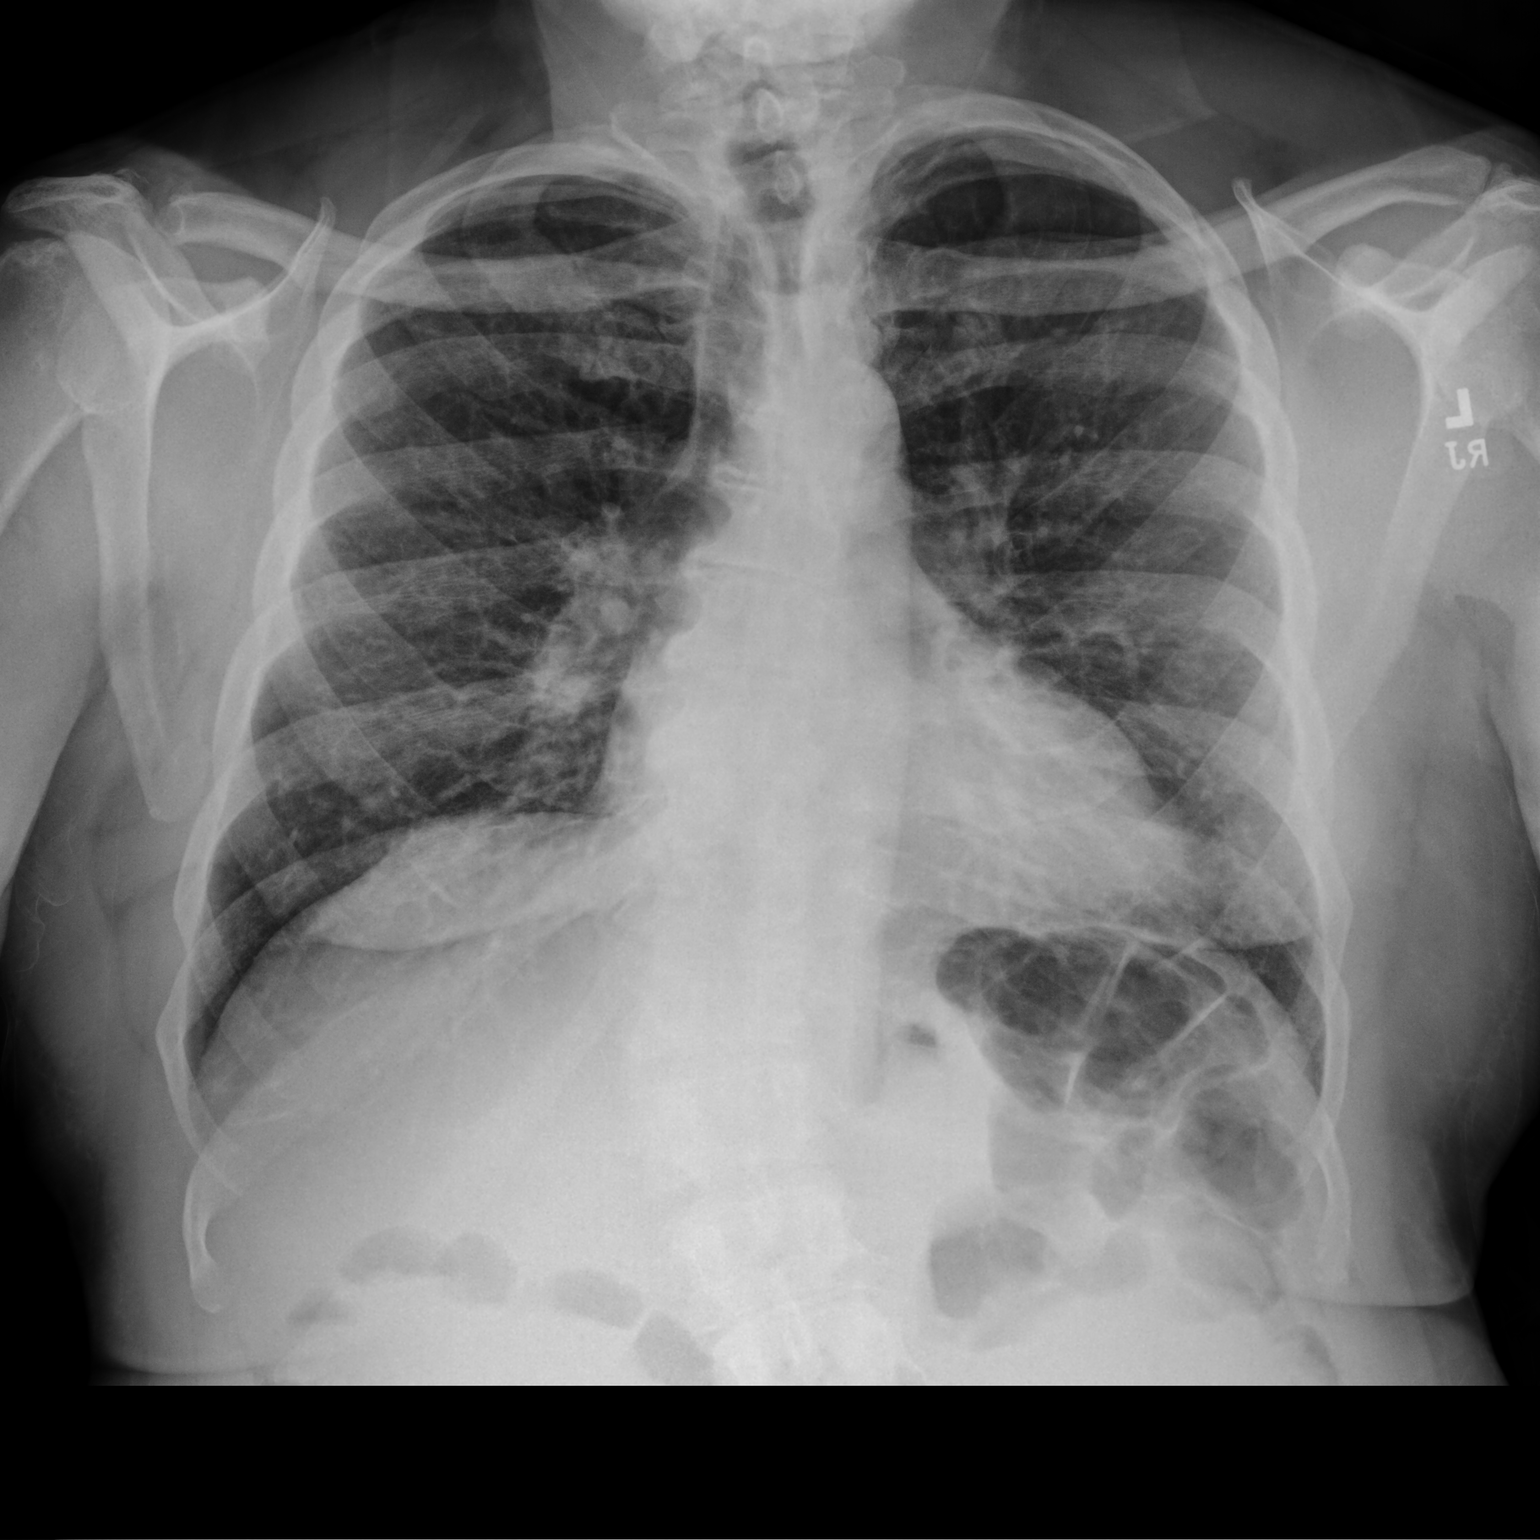

[chest lat]
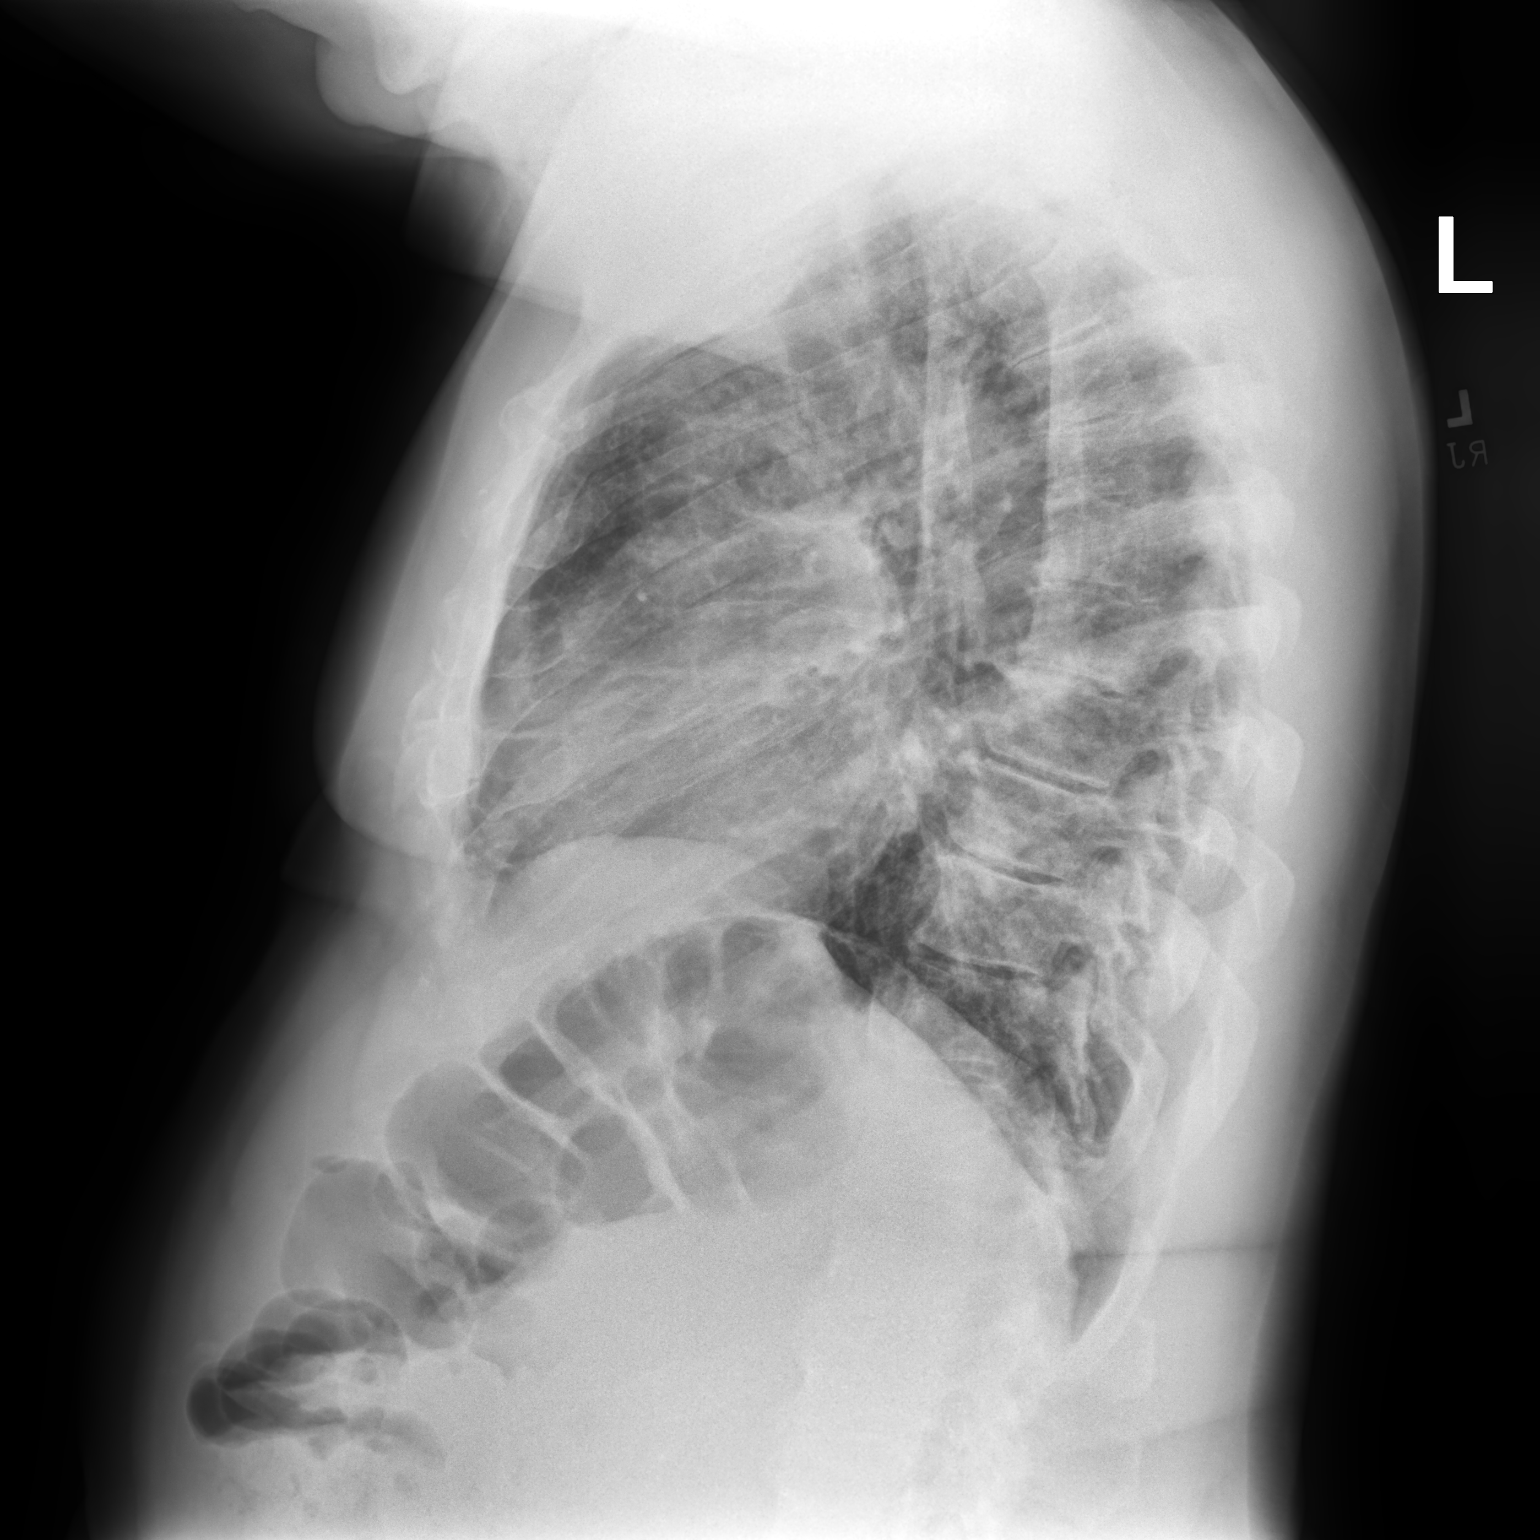

[2 of 2 positions shown; findings below may reference images not displayed]

FINDINGS: Lung volumes are normal. Coarse interstitial markings are again
noted throughout the lungs bilaterally, similar to prior
examinations. No consolidative airspace disease. No pleural
effusions. No pneumothorax. No pulmonary nodule or mass noted.
Pulmonary vasculature and the cardiomediastinal silhouette are
within normal limits.
IMPRESSION: 1. Coarse interstitial markings in the lungs, similar to prior
examinations. No new acute findings are noted.

## 2023-08-19 ENCOUNTER — Other Ambulatory Visit: Payer: Self-pay | Admitting: Emergency Medicine

## 2023-08-21 ENCOUNTER — Encounter: Payer: Self-pay | Admitting: Adult Health

## 2023-08-21 ENCOUNTER — Telehealth: Payer: Self-pay | Admitting: Adult Health

## 2023-08-21 ENCOUNTER — Ambulatory Visit: Admitting: Adult Health

## 2023-08-21 VITALS — BP 130/69 | HR 81 | Ht 67.0 in | Wt 210.0 lb

## 2023-08-21 DIAGNOSIS — G4733 Obstructive sleep apnea (adult) (pediatric): Secondary | ICD-10-CM | POA: Diagnosis not present

## 2023-08-21 NOTE — Patient Instructions (Signed)
Your Plan:  Continue using CPAP nightly and greater than 4 hours each night If your symptoms worsen or you develop new symptoms please let us know.       Thank you for coming to see us at [facility]. I hope we have been able to provide you high quality care today.  You may receive a patient satisfaction survey over the next few weeks. We would appreciate your feedback and comments so that we may continue to improve ourselves and the health of our patients.  

## 2023-08-21 NOTE — Telephone Encounter (Signed)
 Patient called to verify appointment time for today and check if need to bring CPAP machine

## 2023-08-21 NOTE — Progress Notes (Signed)
 PATIENT: Philip Woodard DOB: December 21, 1950  REASON FOR VISIT: follow up HISTORY FROM: patient PRIMARY NEUROLOGIST: Dr. Omar Bibber  Chief Complaint  Patient presents with   Rm 5    Patient is here alone for initial cpap follow-up. He feels everything is going well with it and has no complaints. ESS 11     HISTORY OF PRESENT ILLNESS: Today 08/21/23:  Philip Woodard is a 73 y.o. male with a history of OSA on CPAP. Returns today for follow-up.  He received a new machine.  He is here today for his initial CPAP download his download is below.  He reports that the new machine is working well.  He continues to find CPAP beneficial.  His download is below.  Denies any new medical issues     03/22/23: Philip Woodard is a 73 y.o. male with a history of OSA on CPAP. Returns today for follow-up.  Reports that CPAP is working well.  He does not like to sleep without it.  He states that this is the only machine he has ever had.  And the power cord is not connecting easily.  His download is below     03/16/22: Philip Woodard is a 73 y.o. male with a history of OSA on CPAP. Returns today for follow-up.  Reports that the CPAP is working well for him.  States that he continues to notice the benefit.  He does feel the mask leaking. Wihtin the last year, he did have mask refitting. He feels that the straps should be tighter.  Changes out his supplies regularly.  His download is below       03/16/21 Philip Woodard is a 73 year old male with a history of obstructive sleep apnea on CPAP.  He returns today for follow-up.  He reports that the CPAP continues to work well for him.  He reports that he does notice the mask leaking at night.  He does change out his supplies regularly.  He returns today for an evaluation.    HISTORY (copied from Dr. Dail Drought note) Mr. Philip Woodard is a 73 year old right-handed gentleman with an underlying medical history of sarcoidosis, hypothyroidism, arthritis, allergic rhinitis, and  borderline obesity, who was previously diagnosed with obstructive sleep apnea and placed on CPAP therapy.  Prior sleep study results are not available for my review today.  I reviewed your office note from 01/15/2020.  His Epworth sleepiness score is 10 of 24, fatigue severity score is 11 out of 63. He has benefited from treatment.  He is fully compliant with his AutoPap.  He reports that he had a sleep study originally at [facility] in 2000.  He had a home sleep test some for 5 years ago he recalls.  His current machine works well.  He does use a full facemask, he brought his machine and his mask, he uses a large F 20 fullface mask.  He does notice the leak at times.  He also has mouth dryness.  He generally goes to bed between 11 and midnight and rise time is between 530 and 6.  He denies any recurrent morning headaches or nighttime nocturia.  He is married and lives with his wife.  He is semiretired.  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  They have 2 grown daughters, he has one 2-year-old granddaughter as well.  His older daughter lives in [place], younger daughter in [place] .  Younger daughter has sleep apnea, he has  a brother with sleep apnea as well.  He is a non-smoker and drinks alcohol rarely, maybe once a month and no day-to-day caffeine, occasional coffee during the week. I was able to review his CPAP compliance data from 02/15/2020 through 03/15/2020, which is a total of 30 days, during which time he used his machine every night, with percent use days greater than 4 hours at 97%, indicating excellent compliance with an average usage of 6 hours and 6 minutes, residual AHI at goal at 3/h, 95th percentile of pressure at 13.6 cm with a range of 4 to 16 cm with EPR.  He is on AutoPap, he has an air sense 10 auto machine from [employer], set up date according to online records was 10/28/2015.  His air leakage is on the high side fairly consistently with the 95th  percentile at 50.8 L/min.  His DME company is [employer].  REVIEW OF SYSTEMS: Out of a complete 14 system review of symptoms, the patient complains only of the following symptoms, and all other reviewed systems are negative.  ESS 9  ALLERGIES: Allergies  Allergen Reactions   Other     SEAFOOD   Shellfish Allergy  Swelling    Tongue and facial Swelling Tongue and facial Swelling    HOME MEDICATIONS: Outpatient Medications Prior to Visit  Medication Sig Dispense Refill   acetaminophen  (TYLENOL ) 650 MG CR tablet Take 650 mg by mouth every 8 (eight) hours as needed.     acyclovir  (ZOVIRAX ) 800 MG tablet Take 1 tablet (800 mg total) by mouth daily. 90 tablet 0   amLODipine  (NORVASC ) 10 MG tablet Take 1 tablet (10 mg total) by mouth daily. 90 tablet 1   cholecalciferol (VITAMIN D3) 25 MCG (1000 UT) tablet Take 1,000 Units by mouth daily.     Cyanocobalamin  (VITAMIN B-12 PO) Take by mouth.     gabapentin  (NEURONTIN ) 300 MG capsule Take 1 capsule (300 mg total) by mouth 3 (three) times daily. 90 capsule 1   Glucosamine-Chondroit-Vit C-Mn (GLUCOSAMINE 1500 COMPLEX) CAPS Take by mouth daily.     hydroxychloroquine  (PLAQUENIL ) 200 MG tablet TAKE 1 TABLET BY MOUTH TWICE A DAY 180 tablet 1   levothyroxine  (SYNTHROID ) 175 MCG tablet Take 1 tablet (175 mcg total) by mouth daily. 90 tablet 3   Multiple Vitamin (MULTIVITAMIN) LIQD Take 5 mLs by mouth daily.     Omega-3 Fatty Acids (FISH OIL) 1000 MG CAPS Take 2 capsules by mouth daily.     pantoprazole  (PROTONIX ) 40 MG tablet Take 1 tablet (40 mg total) by mouth daily. 90 tablet 0   sildenafil  (VIAGRA ) 100 MG tablet TAKE 1 TABLET BY MOUTH EVERY DAY AS NEEDED FOR ERECTILE DYSFUNCTION 10 tablet 3   tamsulosin  (FLOMAX ) 0.4 MG CAPS capsule Take 1 capsule (0.4 mg total) by mouth daily. 90 capsule 1   Turmeric 500 MG TABS Take 1 tablet by mouth 2 (two) times daily.     valsartan  (DIOVAN ) 160 MG tablet TAKE 1 TABLET BY MOUTH EVERY DAY 90 tablet 3   zinc  gluconate 50 MG tablet Take 50 mg by mouth daily.     No facility-administered medications prior to visit.    PAST MEDICAL HISTORY: Past Medical History:  Diagnosis Date   Alkaline phosphatase raised 06/28/2011   Fluctuating - suspect due to sarcoidosis   Allergic rhinitis    Allergy     Arthritis    knees   Genital herpes    GERD (gastroesophageal reflux disease)    Hemorrhoids  Hyperglycemia    Hyperplastic lymph node 06/27/2011   Submental node excised 1/02; regrowth: re-excision 10/06   Hypertension    Hypothyroidism    Multinodular goiter    Rheumatoid arteritis (HCC)    Routine general medical examination at a health care facility    Sarcoidosis    Sleep apnea    wears cpap     PAST SURGICAL HISTORY: Past Surgical History:  Procedure Laterality Date   COLONOSCOPY  5-23/2003   COLONOSCOPY  02/04/2018   TA   COLONOSCOPY WITH PROPOFOL  05/26/2021   Sallyann Crea at [facility]   POLYPECTOMY     ROTATOR CUFF REPAIR Bilateral [id number]   THYROIDECTOMY  2002    FAMILY HISTORY: Family History  Problem Relation Age of Onset   Hypertension Mother    Dementia Father    Sleep apnea Brother    Healthy Brother    Sleep apnea Paternal Aunt    Asthma Cousin    Healthy Daughter    Healthy Daughter    Sarcoidosis Neg Hx    Colon polyps Neg Hx    Colon cancer Neg Hx    Esophageal cancer Neg Hx    Stomach cancer Neg Hx    Rectal cancer Neg Hx     SOCIAL HISTORY: Social History   Socioeconomic History   Marital status: Married    Spouse name: Not on file   Number of children: 2   Years of education: Not on file   Highest education level: Not on file  Occupational History   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  Tobacco Use   Smoking status: Never    Passive exposure: Never   Smokeless tobacco: Never  Vaping Use   Vaping status: Never Used  Substance and Sexual Activity   Alcohol use: Yes    Comment: 3 times per month   Drug use: Never   Sexual activity:  Not on file  Other Topics Concern   Not on file  Social History Narrative   Right handed   Caffeine: 1-2 cups/day    Lives at home with wife and daughter   Social Drivers of Health   Financial Resource Strain: Low Risk  (04/30/2023)   Overall Financial Resource Strain (CARDIA)    Difficulty of Paying Living Expenses: Not hard at all  Food Insecurity: No Food Insecurity (04/30/2023)   Hunger Vital Sign    Worried About Running Out of Food in the Last Year: Never true    Ran Out of Food in the Last Year: Never true  Transportation Needs: No Transportation Needs (04/30/2023)   PRAPARE - Administrator, Civil Service (Medical): No    Lack of Transportation (Non-Medical): No  Physical Activity: Sufficiently Active (04/30/2023)   Exercise Vital Sign    Days of Exercise per Week: 3 days    Minutes of Exercise per Session: 60 min  Stress: No Stress Concern Present (04/30/2023)   [employer] of Occupational Health - Occupational Stress Questionnaire    Feeling of Stress : Not at all  Social Connections: Socially Integrated (04/30/2023)   Social Connection and Isolation Panel [NHANES]    Frequency of Communication with Friends and Family: More than three times a week    Frequency of Social Gatherings with Friends and Family: Once a week    Attends Religious Services: More than 4 times per year    Active Member of [employer] or Organizations: Yes    Attends Banker Meetings: More than 4 times  per year    Marital Status: Married  Catering manager Violence: Not At Risk (04/30/2023)   Humiliation, Afraid, Rape, and Kick questionnaire    Fear of Current or Ex-Partner: No    Emotionally Abused: No    Physically Abused: No    Sexually Abused: No      PHYSICAL EXAM  Vitals:   08/21/23 1136  BP: 130/69  Pulse: 81  Weight: 210 lb (95.3 kg)  Height: 5\' 7"  (1.702 m)     Body mass index is 32.89 kg/m.  Generalized: Well developed, in no acute distress  Chest:  Lungs clear to auscultation bilaterally  Neurological examination  Mentation: Alert oriented to time, place, history taking. Follows all commands speech and language fluent Cranial nerve II-XII: Facial symmetry noted  DIAGNOSTIC DATA (LABS, IMAGING, TESTING) - I reviewed patient records, labs, notes, testing and imaging myself where available.  Lab Results  Component Value Date   WBC 7.7 03/19/2023   HGB 12.8 (L) 03/19/2023   HCT 39.0 03/19/2023   MCV 88.2 03/19/2023   PLT 275.0 03/19/2023      Component Value Date/Time   NA 140 03/19/2023 1523   K 4.4 03/19/2023 1523   CL 104 03/19/2023 1523   CO2 30 03/19/2023 1523   GLUCOSE 81 03/19/2023 1523   BUN 20 03/19/2023 1523   CREATININE 1.30 03/19/2023 1523   CREATININE 1.39 (H) 02/12/2023 1546   CALCIUM 9.2 04/26/2023 0000   PROT 7.0 03/19/2023 1523   PROT 6.7 08/10/2022 0900   ALBUMIN 4.4 03/19/2023 1523   AST 24 03/19/2023 1523   AST 28 10/05/2022 1132   ALT 17 03/19/2023 1523   ALT 19 10/05/2022 1132   ALKPHOS 91 03/19/2023 1523   BILITOT 0.8 03/19/2023 1523   BILITOT 0.8 10/05/2022 1132   GFRNONAA >60 10/05/2022 1132   GFRAA  02/20/2009 0336    >60        The eGFR has been calculated using the MDRD equation. This calculation has not been validated in all clinical situations. eGFR's persistently <60 mL/min signify possible Chronic Kidney Disease.   Lab Results  Component Value Date   CHOL 144 03/19/2023   HDL 47.60 03/19/2023   LDLCALC 80 03/19/2023   TRIG 82.0 03/19/2023   CHOLHDL 3 03/19/2023   Lab Results  Component Value Date   HGBA1C 5.6 08/10/2022   Lab Results  Component Value Date   VITAMINB12 1,173 (H) 03/19/2023   Lab Results  Component Value Date   TSH 0.14 (L) 03/19/2023      ASSESSMENT AND PLAN 73 y.o. year old male  has a past medical history of Alkaline phosphatase raised (06/28/2011), Allergic rhinitis, Allergy , Arthritis, Genital herpes, GERD (gastroesophageal reflux disease),  Hemorrhoids, Hyperglycemia, Hyperplastic lymph node (06/27/2011), Hypertension, Hypothyroidism, Multinodular goiter, Rheumatoid arteritis (HCC), Routine general medical examination at a health care facility, Sarcoidosis, and Sleep apnea. here with:  OSA on CPAP  - CPAP compliance excellent - Good treatment of AHI  - Encourage patient to use CPAP nightly and > 4 hours each night - F/U in 1 year or sooner if needed   Clem Currier, MSN, NP-C 08/21/2023, 11:40 AM [facility] [address], [address] [place], [place] [address] [phone]

## 2023-08-28 NOTE — Progress Notes (Signed)
 Office Visit Note  Patient: Philip Woodard             Date of Birth: Jul 31, 1950           MRN: [id number]             PCP: Purcell Emil Schanz, MD Referring: Purcell Emil Schanz, * Visit Date: 09/11/2023 Occupation: @GUAROCC @  Subjective:  Medication management  History of Present Illness: Philip Woodard is a 73 y.o. male with polymyalgia rheumatica, gout, osteoarthritis and sarcoidosis.  He returns today after his last visit in January 2025.  He has been off prednisone  for almost 2 years now.  He denies any increased muscular weakness or tenderness.  He denies any shortness of breath, dysphagia or dysphonia.  He denies any joint pain or joint swelling.  He has not had any gout flares.  He has been on hydroxychloroquine  200 mg p.o. twice daily.  He is not taking any medications for gout.    Activities of Daily Living:  Patient reports morning stiffness for 5 minutes.   Patient Denies nocturnal pain.  Difficulty dressing/grooming: Denies Difficulty climbing stairs: Denies Difficulty getting out of chair: Denies Difficulty using hands for taps, buttons, cutlery, and/or writing: Denies  Review of Systems  Constitutional:  Negative for fatigue.  HENT:  Negative for mouth sores and mouth dryness.   Eyes:  Negative for dryness.  Respiratory:  Negative for shortness of breath.   Cardiovascular:  Negative for chest pain and palpitations.  Gastrointestinal:  Negative for blood in stool, constipation and diarrhea.  Endocrine: Negative for increased urination.  Genitourinary:  Negative for involuntary urination.  Musculoskeletal:  Positive for morning stiffness. Negative for joint pain, gait problem, joint pain, joint swelling, myalgias, muscle weakness, muscle tenderness and myalgias.  Skin:  Negative for color change, rash, hair loss and sensitivity to sunlight.  Allergic/Immunologic: Negative for susceptible to infections.  Neurological:  Negative for dizziness and headaches.   Hematological:  Negative for swollen glands.  Psychiatric/Behavioral:  Negative for depressed mood and sleep disturbance. The patient is not nervous/anxious.     PMFS History:  Patient Active Problem List   Diagnosis Date Noted   Anemia 03/19/2023   Bilateral carpal tunnel syndrome 08/21/2022   NCGS (non-celiac gluten sensitivity) 01/25/2022   Internal hemorrhoids 01/25/2022   Cervical radiculopathy 10/17/2021   Lower urinary tract symptoms 07/20/2021   Chronic idiopathic gout involving toe of left foot without tophus 04/27/2021   Stage 3a chronic kidney disease (HCC) 03/31/2021   Erectile dysfunction due to arterial insufficiency 03/31/2021   NASH (nonalcoholic steatohepatitis) 02/22/2015   Arthritis of right lower extremity 07/20/2014   Hearing loss 02/19/2014   OSA on CPAP 05/02/2013   Hypothyroid 03/29/2012   Primary hypertension 11/24/2008   HYPOTHYROIDISM, POSTSURGICAL 07/07/2008   HYPERTROPHY PROSTATE W/UR OBST & OTH LUTS 07/07/2008   DISC DISEASE, LUMBAR 12/27/2007   Sarcoidosis 06/06/2007   GOITER, MULTINODULAR 06/06/2007   Hemorrhoids 06/06/2007   Asthma 06/06/2007    Past Medical History:  Diagnosis Date   Alkaline phosphatase raised 06/28/2011   Fluctuating - suspect due to sarcoidosis   Allergic rhinitis    Allergy     Arthritis    knees   Genital herpes    GERD (gastroesophageal reflux disease)    Hemorrhoids    Hyperglycemia    Hyperplastic lymph node 06/27/2011   Submental node excised 1/02; regrowth: re-excision 10/06   Hypertension    Hypothyroidism    Multinodular goiter  Rheumatoid arteritis (HCC)    Routine general medical examination at a health care facility    Sarcoidosis    Sleep apnea    wears cpap     Family History  Problem Relation Age of Onset   Hypertension Mother    Dementia Father    Sleep apnea Brother    Healthy Brother    Sleep apnea Paternal Aunt    Asthma Cousin    Healthy Daughter    Healthy Daughter     Sarcoidosis Neg Hx    Colon polyps Neg Hx    Colon cancer Neg Hx    Esophageal cancer Neg Hx    Stomach cancer Neg Hx    Rectal cancer Neg Hx    Past Surgical History:  Procedure Laterality Date   COLONOSCOPY  5-23/2003   COLONOSCOPY  02/04/2018   TA   COLONOSCOPY WITH PROPOFOL  05/26/2021   Gwendlyn Buddy [facility]   POLYPECTOMY     ROTATOR CUFF REPAIR Bilateral [id number]   THYROIDECTOMY  2002   Social History   Social History Narrative   Right handed   Caffeine: 1-2 cups/day    Lives at home with wife and daughter   Immunization History  Administered Date(s) Administered   Fluad Quad(high Dose 65+) 12/11/2018, 01/20/2020, 03/31/2021, 01/25/2022   Fluad Trivalent(High Dose 65+) 03/19/2023   Influenza Whole 12/11/2009, 12/11/2011   Influenza, High Dose Seasonal PF 11/14/2017   Influenza,inj,Quad PF,6+ Mos 01/31/2013, 12/10/2013, 02/22/2016, 12/19/2016   Moderna Sars-Covid-2 Vaccination 03/18/2020   PFIZER(Purple Top)SARS-COV-2 Vaccination 04/04/2019, 04/25/2019   PNEUMOCOCCAL CONJUGATE-20 01/25/2022   Pneumococcal Conjugate-13 11/14/2017   Pneumococcal Polysaccharide-23 01/31/2013   Td 10/23/2005   Tdap 02/22/2016   Zoster, Live 01/31/2013     Objective: Vital Signs: BP 130/65 (BP Location: Left Arm, Patient Position: Sitting, Cuff Size: Normal)   Pulse 87   Resp 16   Ht 5' 7 (1.702 m)   Wt 208 lb 6.4 oz (94.5 kg)   BMI 32.64 kg/m    Physical Exam Vitals and nursing note reviewed.  Constitutional:      Appearance: He is well-developed.  HENT:     Head: Normocephalic and atraumatic.   Eyes:     Conjunctiva/sclera: Conjunctivae normal.     Pupils: Pupils are equal, round, and reactive to light.    Cardiovascular:     Rate and Rhythm: Normal rate and regular rhythm.     Heart sounds: Normal heart sounds.  Pulmonary:     Effort: Pulmonary effort is normal.     Breath sounds: Normal breath sounds.  Abdominal:     General: Bowel sounds are normal.      Palpations: Abdomen is soft.   Musculoskeletal:     Cervical back: Normal range of motion and neck supple.   Skin:    General: Skin is warm and dry.     Capillary Refill: Capillary refill takes less than 2 seconds.   Neurological:     Mental Status: He is alert and oriented to person, place, and time.   Psychiatric:        Behavior: Behavior normal.      Musculoskeletal Exam: Cervical, thoracic and lumbar spine with good range of motion without any discomfort.  Shoulders, elbows, wrist joints, MCPs PIPs and DIPs were in good range of motion without any synovitis.  PIP and DIP thickening was noted.  Hip joints and knee joints in good range of motion without any warmth swelling or effusion.  There was  no tenderness over ankles or MTPs.  CDAI Exam: CDAI Score: -- Patient Global: --; Provider Global: -- Swollen: --; Tender: -- Joint Exam 09/11/2023   No joint exam has been documented for this visit   There is currently no information documented on the homunculus. Go to the Rheumatology activity and complete the homunculus joint exam.  Investigation: No additional findings.  Imaging: No results found.  Recent Labs: Lab Results  Component Value Date   WBC 7.7 03/19/2023   HGB 12.8 (L) 03/19/2023   PLT 275.0 03/19/2023   NA 140 03/19/2023   K 4.4 03/19/2023   CL 104 03/19/2023   CO2 30 03/19/2023   GLUCOSE 81 03/19/2023   BUN 20 03/19/2023   CREATININE 1.30 03/19/2023   BILITOT 0.8 03/19/2023   ALKPHOS 91 03/19/2023   AST 24 03/19/2023   ALT 17 03/19/2023   PROT 7.0 03/19/2023   ALBUMIN 4.4 03/19/2023   CALCIUM 9.2 04/26/2023   GFRAA  02/20/2009    >60        The eGFR has been calculated using the MDRD equation. This calculation has not been validated in all clinical situations. eGFR's persistently <60 mL/min signify possible Chronic Kidney Disease.   [facility] NEGATIVE 07/08/2021    Speciality Comments: PLQ Eye Exam 07/01/2022 normal [facility]  Ophthalmology f/u 12 months. MTX-dcd due to increase in Cr  Procedures:  No procedures performed Allergies: Other and Shellfish allergy    Assessment / Plan:     Visit Diagnoses: Polymyalgia rheumatica (HCC) - Diagnosed by his PCP.  He has been off prednisone  since 2023.He has not had any flares of PMR.  He has been on Plaquenil  200 mg p.o. twice daily.  We discussed reducing the dose of Plaquenil  to 200 mg once a day if tolerated.  If he starts having increased joint pain or swelling then he may resume it to 200 mg twice daily.  Patient was in agreement.  Sarcoidosis - Positive lymph node biopsy 2001.  Lungs were clear to auscultation.  No lymphadenopathy was noted.  He is asymptomatic.  Chronic idiopathic gout involving toe of left foot without tophus -he is not taking any uricosuric agents.  He has not had any gout flares.  Plan: Uric acid  High risk medication use - Plaquenil  200 mg p.o. twice daily.  Eye exam July 01, 2022 was normal.March 19, 2023 CBC and CMP were normal.- Plan: CBC with Differential/Platelet, Comprehensive metabolic panel with GFR today.  Elevated CK - CK531 on January 10, 2022.  Myositis panel and HMG CR antibodies - August 2024.  He was evaluated by Dr. Buck in the past and had extensive workup including EMG nerve conduction velocity which was all normal.  He denies muscular weakness or tenderness.  He goes to the gym on a regular basis and works out.. - Plan: CK  Chondromalacia patellae, right knee-he is symptomatic.  DDD (degenerative disc disease), cervical-good range of motion without discomfort.  Degeneration of intervertebral disc of lumbar region without discogenic back pain or lower extremity pain-he had good mobility without discomfort.  Other medical problems are listed as follows:  NASH (nonalcoholic steatohepatitis)  Stage 3a chronic kidney disease (HCC) - Followed by nephrology.  Creatinine was 1.30 on March 19, 2023.  Primary hypertension-blood  pressure was normal at 130/65 today.  History of asthma  Vitamin D  deficiency  History of hypothyroidism - Status post thyroidectomy at 2002  OSA on CPAP-followed by neurology.  Orders: Orders Placed This Encounter  Procedures  CBC with Differential/Platelet   Comprehensive metabolic panel with GFR   Uric acid   CK   Meds ordered this encounter  Medications   hydroxychloroquine  (PLAQUENIL ) 200 MG tablet    Sig: Take 1 tablet (200 mg total) by mouth 2 (two) times daily.    Dispense:  180 tablet    Refill:  1     Follow-Up Instructions: Return in about 6 months (around 03/13/2024) for PMR, Osteoarthritis, Gout.   Maya Nash, MD  Note - This record has been created using Animal nutritionist.  Chart creation errors have been sought, but may not always  have been located. Such creation errors do not reflect on  the standard of medical care.

## 2023-09-05 ENCOUNTER — Ambulatory Visit: Payer: Medicare Other | Admitting: Rheumatology

## 2023-09-11 ENCOUNTER — Ambulatory Visit: Payer: Medicare Other | Attending: Rheumatology | Admitting: Rheumatology

## 2023-09-11 ENCOUNTER — Encounter: Payer: Self-pay | Admitting: Rheumatology

## 2023-09-11 VITALS — BP 130/65 | HR 87 | Resp 16 | Ht 67.0 in | Wt 208.4 lb

## 2023-09-11 DIAGNOSIS — Z8639 Personal history of other endocrine, nutritional and metabolic disease: Secondary | ICD-10-CM

## 2023-09-11 DIAGNOSIS — D869 Sarcoidosis, unspecified: Secondary | ICD-10-CM | POA: Diagnosis not present

## 2023-09-11 DIAGNOSIS — R748 Abnormal levels of other serum enzymes: Secondary | ICD-10-CM

## 2023-09-11 DIAGNOSIS — M1A0720 Idiopathic chronic gout, left ankle and foot, without tophus (tophi): Secondary | ICD-10-CM | POA: Diagnosis not present

## 2023-09-11 DIAGNOSIS — N1831 Chronic kidney disease, stage 3a: Secondary | ICD-10-CM | POA: Diagnosis not present

## 2023-09-11 DIAGNOSIS — Z79899 Other long term (current) drug therapy: Secondary | ICD-10-CM | POA: Diagnosis not present

## 2023-09-11 DIAGNOSIS — M51369 Other intervertebral disc degeneration, lumbar region without mention of lumbar back pain or lower extremity pain: Secondary | ICD-10-CM

## 2023-09-11 DIAGNOSIS — M2241 Chondromalacia patellae, right knee: Secondary | ICD-10-CM | POA: Diagnosis not present

## 2023-09-11 DIAGNOSIS — K7581 Nonalcoholic steatohepatitis (NASH): Secondary | ICD-10-CM

## 2023-09-11 DIAGNOSIS — M353 Polymyalgia rheumatica: Secondary | ICD-10-CM

## 2023-09-11 DIAGNOSIS — G4733 Obstructive sleep apnea (adult) (pediatric): Secondary | ICD-10-CM

## 2023-09-11 DIAGNOSIS — Z8709 Personal history of other diseases of the respiratory system: Secondary | ICD-10-CM

## 2023-09-11 DIAGNOSIS — M5030 Other cervical disc degeneration, unspecified cervical region: Secondary | ICD-10-CM | POA: Diagnosis not present

## 2023-09-11 DIAGNOSIS — E559 Vitamin D deficiency, unspecified: Secondary | ICD-10-CM

## 2023-09-11 DIAGNOSIS — I10 Essential (primary) hypertension: Secondary | ICD-10-CM | POA: Diagnosis not present

## 2023-09-11 MED ORDER — HYDROXYCHLOROQUINE SULFATE 200 MG PO TABS: 200.0000 mg | ORAL_TABLET | Freq: Two times a day (BID) | ORAL | 1 refills | Status: AC

## 2023-09-12 ENCOUNTER — Ambulatory Visit: Payer: Self-pay | Admitting: Rheumatology

## 2023-09-12 LAB — COMPREHENSIVE METABOLIC PANEL WITH GFR
AG Ratio: 1.6 (calc) (ref 1.0–2.5)
ALT: 22 U/L (ref 9–46)
AST: 25 U/L (ref 10–35)
Albumin: 4.2 g/dL (ref 3.6–5.1)
Alkaline phosphatase (APISO): 87 U/L (ref 35–144)
BUN: 22 mg/dL (ref 7–25)
CO2: 28 mmol/L (ref 20–32)
Calcium: 9.3 mg/dL (ref 8.6–10.3)
Chloride: 107 mmol/L (ref 98–110)
Creat: 1.22 mg/dL (ref 0.70–1.28)
Globulin: 2.7 g/dL (ref 1.9–3.7)
Glucose, Bld: 84 mg/dL (ref 65–99)
Potassium: 4.1 mmol/L (ref 3.5–5.3)
Sodium: 141 mmol/L (ref 135–146)
Total Bilirubin: 0.7 mg/dL (ref 0.2–1.2)
Total Protein: 6.9 g/dL (ref 6.1–8.1)
eGFR: 63 mL/min/{1.73_m2} (ref >=60)

## 2023-09-12 LAB — URIC ACID: Uric Acid, Serum: 5.9 mg/dL (ref 4.0–8.0)

## 2023-09-12 LAB — CBC WITH DIFFERENTIAL/PLATELET
Absolute Lymphocytes: 3512 {cells}/uL (ref 850–3900)
Absolute Monocytes: 920 {cells}/uL (ref 200–950)
Basophils Absolute: 80 {cells}/uL (ref 0–200)
Basophils Relative: 1 %
Eosinophils Absolute: 264 {cells}/uL (ref 15–500)
Eosinophils Relative: 3.3 %
HCT: 32.8 % — ABNORMAL LOW (ref 38.5–50.0)
Hemoglobin: 10.1 g/dL — ABNORMAL LOW (ref 13.2–17.1)
MCH: 25.5 pg — ABNORMAL LOW (ref 27.0–33.0)
MCHC: 30.8 g/dL — ABNORMAL LOW (ref 32.0–36.0)
MCV: 82.8 fL (ref 80.0–100.0)
MPV: 10.3 fL (ref 7.5–12.5)
Monocytes Relative: 11.5 %
Neutro Abs: 3224 {cells}/uL (ref 1500–7800)
Neutrophils Relative %: 40.3 %
Platelets: 293 10*3/uL (ref 140–400)
RBC: 3.96 Million/uL — ABNORMAL LOW (ref 4.20–5.80)
RDW: 13.5 % (ref 11.0–15.0)
Total Lymphocyte: 43.9 %
WBC: 8 10*3/uL (ref 3.8–10.8)

## 2023-09-12 LAB — CK: Total CK: 361 U/L — ABNORMAL HIGH (ref 19–278)

## 2023-09-12 NOTE — Progress Notes (Signed)
 Uric acid 5.9 in the desirable range.  CMP normal.  Hemoglobin is low at 10.1.  CK is stable at 361.  Please forward results to his PCP.  He may need further evaluation of anemia.

## 2023-09-16 ENCOUNTER — Other Ambulatory Visit: Payer: Self-pay | Admitting: Emergency Medicine

## 2023-09-17 ENCOUNTER — Ambulatory Visit (INDEPENDENT_AMBULATORY_CARE_PROVIDER_SITE_OTHER): Payer: Medicare HMO | Admitting: Emergency Medicine

## 2023-09-17 ENCOUNTER — Encounter: Payer: Self-pay | Admitting: Emergency Medicine

## 2023-09-17 VITALS — BP 122/64 | HR 80 | Temp 97.8°F | Ht 67.0 in | Wt 210.0 lb

## 2023-09-17 DIAGNOSIS — K7581 Nonalcoholic steatohepatitis (NASH): Secondary | ICD-10-CM | POA: Diagnosis not present

## 2023-09-17 DIAGNOSIS — M1A0720 Idiopathic chronic gout, left ankle and foot, without tophus (tophi): Secondary | ICD-10-CM | POA: Diagnosis not present

## 2023-09-17 DIAGNOSIS — E039 Hypothyroidism, unspecified: Secondary | ICD-10-CM

## 2023-09-17 DIAGNOSIS — N1831 Chronic kidney disease, stage 3a: Secondary | ICD-10-CM | POA: Diagnosis not present

## 2023-09-17 DIAGNOSIS — I10 Essential (primary) hypertension: Secondary | ICD-10-CM

## 2023-09-17 DIAGNOSIS — R399 Unspecified symptoms and signs involving the genitourinary system: Secondary | ICD-10-CM

## 2023-09-17 DIAGNOSIS — K429 Umbilical hernia without obstruction or gangrene: Secondary | ICD-10-CM | POA: Diagnosis not present

## 2023-09-17 DIAGNOSIS — D869 Sarcoidosis, unspecified: Secondary | ICD-10-CM

## 2023-09-17 DIAGNOSIS — G4733 Obstructive sleep apnea (adult) (pediatric): Secondary | ICD-10-CM | POA: Diagnosis not present

## 2023-09-17 DIAGNOSIS — D509 Iron deficiency anemia, unspecified: Secondary | ICD-10-CM

## 2023-09-17 NOTE — Assessment & Plan Note (Signed)
Stable.  On CPAP treatment. Recently seen by neurologist Office visit notes reviewed

## 2023-09-17 NOTE — Assessment & Plan Note (Signed)
 Last GFR 63 Advised to stay well-hydrated and avoid NSAIDs is much as possible

## 2023-09-17 NOTE — Assessment & Plan Note (Signed)
Well-controlled hypertension. Continue amlodipine 10 mg and valsartan 160 mg daily Cardiovascular risks associated with hypertension discussed Diet and nutrition discussed.

## 2023-09-17 NOTE — Assessment & Plan Note (Signed)
 Clinically stable.  Asymptomatic. May need general surgery evaluation in the near future

## 2023-09-17 NOTE — Assessment & Plan Note (Signed)
Much improved with tamsulosin 0.4 mg daily.

## 2023-09-17 NOTE — Assessment & Plan Note (Signed)
 Clinically euthyroid Continues Synthroid  200 mcg daily Lab Results  Component Value Date   TSH 0.14 (L) 03/19/2023

## 2023-09-17 NOTE — Patient Instructions (Signed)
 Health Maintenance After Age 71 After age 4, you are at a higher risk for certain long-term diseases and infections as well as injuries from falls. Falls are a major cause of broken bones and head injuries in people who are older than age 47. Getting regular preventive care can help to keep you healthy and well. Preventive care includes getting regular testing and making lifestyle changes as recommended by your health care provider. Talk with your health care provider about: Which screenings and tests you should have. A screening is a test that checks for a disease when you have no symptoms. A diet and exercise plan that is right for you. What should I know about screenings and tests to prevent falls? Screening and testing are the best ways to find a health problem early. Early diagnosis and treatment give you the best chance of managing medical conditions that are common after age 37. Certain conditions and lifestyle choices may make you more likely to have a fall. Your health care provider may recommend: Regular vision checks. Poor vision and conditions such as cataracts can make you more likely to have a fall. If you wear glasses, make sure to get your prescription updated if your vision changes. Medicine review. Work with your health care provider to regularly review all of the medicines you are taking, including over-the-counter medicines. Ask your health care provider about any side effects that may make you more likely to have a fall. Tell your health care provider if any medicines that you take make you feel dizzy or sleepy. Strength and balance checks. Your health care provider may recommend certain tests to check your strength and balance while standing, walking, or changing positions. Foot health exam. Foot pain and numbness, as well as not wearing proper footwear, can make you more likely to have a fall. Screenings, including: Osteoporosis screening. Osteoporosis is a condition that causes  the bones to get weaker and break more easily. Blood pressure screening. Blood pressure changes and medicines to control blood pressure can make you feel dizzy. Depression screening. You may be more likely to have a fall if you have a fear of falling, feel depressed, or feel unable to do activities that you used to do. Alcohol use screening. Using too much alcohol can affect your balance and may make you more likely to have a fall. Follow these instructions at home: Lifestyle Do not drink alcohol if: Your health care provider tells you not to drink. If you drink alcohol: Limit how much you have to: 0-1 drink a day for women. 0-2 drinks a day for men. Know how much alcohol is in your drink. In the [place] one drink equals one 12 oz bottle of beer (355 mL), one 5 oz glass of wine (148 mL), or one 1 oz glass of hard liquor (44 mL). Do not use any products that contain nicotine or tobacco. These products include cigarettes, chewing tobacco, and vaping devices, such as e-cigarettes. If you need help quitting, ask your health care provider. Activity  Follow a regular exercise program to stay fit. This will help you maintain your balance. Ask your health care provider what types of exercise are appropriate for you. If you need a cane or walker, use it as recommended by your health care provider. Wear supportive shoes that have nonskid soles. Safety  Remove any tripping hazards, such as rugs, cords, and clutter. Install safety equipment such as grab bars in bathrooms and safety rails on stairs. Keep rooms and walkways  well-lit. General instructions Talk with your health care provider about your risks for falling. Tell your health care provider if: You fall. Be sure to tell your health care provider about all falls, even ones that seem minor. You feel dizzy, tiredness (fatigue), or off-balance. Take over-the-counter and prescription medicines only as told by your health care provider. These include  supplements. Eat a healthy diet and maintain a healthy weight. A healthy diet includes low-fat dairy products, low-fat (lean) meats, and fiber from whole grains, beans, and lots of fruits and vegetables. Stay current with your vaccines. Schedule regular health, dental, and eye exams. Summary Having a healthy lifestyle and getting preventive care can help to protect your health and wellness after age 11. Screening and testing are the best way to find a health problem early and help you avoid having a fall. Early diagnosis and treatment give you the best chance for managing medical conditions that are more common for people who are older than age 28. Falls are a major cause of broken bones and head injuries in people who are older than age 48. Take precautions to prevent a fall at home. Work with your health care provider to learn what changes you can make to improve your health and wellness and to prevent falls. This information is not intended to replace advice given to you by your health care provider. Make sure you discuss any questions you have with your health care provider. Document Revised: 07/19/2020 Document Reviewed: 07/19/2020 Elsevier Patient Education  2024 [employer].

## 2023-09-17 NOTE — Assessment & Plan Note (Signed)
 Recent onset.  Asymptomatic and clinically stable No clinical signs of GI bleed. Colonoscopy report from 2023 reviewed.  No diverticulosis Precancerous polyps and internal hemorrhoids No rectal bleeding or melena.  Clinically stable. Recent blood work results reviewed Anemia panel done last January showed iron deficiency Ferritin is within normal limits due to chronic inflammatory conditions

## 2023-09-17 NOTE — Assessment & Plan Note (Signed)
 Stable and asymptomatic

## 2023-09-17 NOTE — Assessment & Plan Note (Signed)
Stable.  Diet and nutrition discussed. 

## 2023-09-17 NOTE — Progress Notes (Signed)
 Philip Woodard 73 y.o.   Chief Complaint  Patient presents with   Follow-up    Patient here for 6 month f/u for HTN. Patient states he's been noticing his navel is protruding out more, says it it tender to the touch.     HISTORY OF PRESENT ILLNESS: This is a 73 y.o. male here for 5-month follow-up of hypertension. Overall doing well.  Has no complaints or medical concerns today Wants me to look at umbilical hernia  Recent office visit with rheumatologist, assessment and plan as follows: Assessment / Plan:     Visit Diagnoses: Polymyalgia rheumatica (HCC) - Diagnosed by his PCP.  He has been off prednisone  since 2023.He has not had any flares of PMR.  He has been on Plaquenil  200 mg p.o. twice daily.  We discussed reducing the dose of Plaquenil  to 200 mg once a day if tolerated.  If he starts having increased joint pain or swelling then he may resume it to 200 mg twice daily.  Patient was in agreement.   Sarcoidosis - Positive lymph node biopsy 2001.  Lungs were clear to auscultation.  No lymphadenopathy was noted.  He is asymptomatic.   Chronic idiopathic gout involving toe of left foot without tophus -he is not taking any uricosuric agents.  He has not had any gout flares.  Plan: Uric acid   High risk medication use - Plaquenil  200 mg p.o. twice daily.  Eye exam July 01, 2022 was normal.March 19, 2023 CBC and CMP were normal.- Plan: CBC with Differential/Platelet, Comprehensive metabolic panel with GFR today.   Elevated CK - CK531 on January 10, 2022.  Myositis panel and HMG CR antibodies - August 2024.  He was evaluated by Dr. Buck in the past and had extensive workup including EMG nerve conduction velocity which was all normal.  He denies muscular weakness or tenderness.  He goes to the gym on a regular basis and works out.. - Plan: CK   Chondromalacia patellae, right knee-he is symptomatic.   DDD (degenerative disc disease), cervical-good range of motion without discomfort.    Degeneration of intervertebral disc of lumbar region without discogenic back pain or lower extremity pain-he had good mobility without discomfort.   Other medical problems are listed as follows:   NASH (nonalcoholic steatohepatitis)   Stage 3a chronic kidney disease (HCC) - Followed by nephrology.  Creatinine was 1.30 on March 19, 2023.   Primary hypertension-blood pressure was normal at 130/65 today.   History of asthma   Vitamin D  deficiency   History of hypothyroidism - Status post thyroidectomy at 2002   OSA on CPAP-followed by neurology.  HPI   Prior to Admission medications   Medication Sig Start Date End Date Taking? Authorizing Provider  acetaminophen  (TYLENOL ) 650 MG CR tablet Take 650 mg by mouth every 8 (eight) hours as needed.   Yes [provider]  acyclovir  (ZOVIRAX ) 800 MG tablet Take 1 tablet (800 mg total) by mouth daily. 08/31/22  Yes Curvin Hunger, Emil Schanz, MD  amLODipine  (NORVASC ) 10 MG tablet Take 1 tablet (10 mg total) by mouth daily. 04/02/23  Yes Ashon Rosenberg Jose, MD  cholecalciferol (VITAMIN D3) 25 MCG (1000 UT) tablet Take 1,000 Units by mouth daily.   Yes [provider]  Cyanocobalamin  (VITAMIN B-12 PO) Take by mouth.   Yes [provider]  gabapentin  (NEURONTIN ) 300 MG capsule TAKE 1 CAPSULE BY MOUTH THREE TIMES A DAY 09/16/23  Yes Jamesen Stahnke, Emil Schanz, MD  Glucosamine-Chondroit-Vit C-Mn (GLUCOSAMINE  1500 COMPLEX) CAPS Take by mouth daily.   Yes [provider]  hydroxychloroquine  (PLAQUENIL ) 200 MG tablet Take 1 tablet (200 mg total) by mouth 2 (two) times daily. 09/11/23  Yes Deveshwar, Maya, MD  levothyroxine  (SYNTHROID ) 175 MCG tablet Take 1 tablet (175 mcg total) by mouth daily. 03/20/23  Yes Vashaun Osmon Jose, MD  Multiple Vitamin (MULTIVITAMIN) LIQD Take 5 mLs by mouth daily.   Yes [provider]  Omega-3 Fatty Acids (FISH OIL) 1000 MG CAPS Take 2 capsules by mouth daily.   Yes [provider]  pantoprazole  (PROTONIX ) 40 MG tablet Take 1 tablet (40 mg total) by mouth daily. 04/02/23  Yes Danis, Victory LITTIE MOULD, MD  sildenafil  (VIAGRA ) 100 MG tablet TAKE 1 TABLET BY MOUTH EVERY DAY AS NEEDED FOR ERECTILE DYSFUNCTION 08/19/23  Yes Sahaj Bona, Emil Schanz, MD  tamsulosin  (FLOMAX ) 0.4 MG CAPS capsule Take 1 capsule (0.4 mg total) by mouth daily. 04/02/23  Yes Karesha Trzcinski, Emil Schanz, MD  Turmeric 500 MG TABS Take 1 tablet by mouth 2 (two) times daily.   Yes [provider]  valsartan  (DIOVAN ) 160 MG tablet TAKE 1 TABLET BY MOUTH EVERY DAY 03/12/22  Yes Anselmo Reihl, Emil Schanz, MD  zinc gluconate 50 MG tablet Take 50 mg by mouth daily.   Yes [provider]    Allergies  Allergen Reactions   Other     SEAFOOD   Shellfish Allergy  Swelling    Tongue and facial Swelling Tongue and facial Swelling    Patient Active Problem List   Diagnosis Date Noted   Anemia 03/19/2023   Bilateral carpal tunnel syndrome 08/21/2022   NCGS (non-celiac gluten sensitivity) 01/25/2022   Internal hemorrhoids 01/25/2022   Cervical radiculopathy 10/17/2021   Lower urinary tract symptoms 07/20/2021   Chronic idiopathic gout involving toe of left foot without tophus 04/27/2021   Stage 3a chronic kidney disease (HCC) 03/31/2021   Erectile dysfunction due to arterial insufficiency 03/31/2021   NASH (nonalcoholic steatohepatitis) 02/22/2015   Arthritis of right lower extremity 07/20/2014   Hearing loss 02/19/2014   OSA on CPAP 05/02/2013   Hypothyroid 03/29/2012   Primary hypertension 11/24/2008   HYPOTHYROIDISM, POSTSURGICAL 07/07/2008   HYPERTROPHY PROSTATE W/UR OBST & OTH LUTS 07/07/2008   DISC DISEASE, LUMBAR 12/27/2007   Sarcoidosis 06/06/2007   GOITER, MULTINODULAR 06/06/2007   Hemorrhoids 06/06/2007   Asthma 06/06/2007    Past Medical History:  Diagnosis Date   Alkaline phosphatase raised 06/28/2011   Fluctuating - suspect due to sarcoidosis   Allergic rhinitis     Allergy     Arthritis    knees   Genital herpes    GERD (gastroesophageal reflux disease)    Hemorrhoids    Hyperglycemia    Hyperplastic lymph node 06/27/2011   Submental node excised 1/02; regrowth: re-excision 10/06   Hypertension    Hypothyroidism    Multinodular goiter    Rheumatoid arteritis (HCC)    Routine general medical examination at a health care facility    Sarcoidosis    Sleep apnea    wears cpap     Past Surgical History:  Procedure Laterality Date   COLONOSCOPY  5-23/2003   COLONOSCOPY  02/04/2018   TA   COLONOSCOPY WITH PROPOFOL  05/26/2021   Gwend[facility] Buddy at [facility]   POLYPECTOMY     ROTATOR CUFF REPAIR Bilateral [id number]   THYROIDECTOMY  2002    Social History   Socioeconomic History   Marital status: Married    Spouse name:  Not on file   Number of children: 2   Years of education: Not on file   Highest education level: Bachelor's degree (e.g., BA, AB, BS)  Occupational History   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  Other Topics Concern   Not on file  Social History Narrative   Right handed   Caffeine: 1-2 cups/day    Lives at home with wife and daughter   Social Drivers of Corporate investment banker Strain: Low Risk  (09/16/2023)   Overall Financial Resource Strain (CARDIA)    Difficulty of Paying Living Expenses: Not hard at all  Food Insecurity: No Food Insecurity (09/16/2023)   Hunger Vital Sign    Worried About Running Out of Food in the Last Year: Never true    Ran Out of Food in the Last Year: Never true  Transportation Needs: No Transportation Needs (09/16/2023)   PRAPARE - Administrator, Civil Service (Medical): No    Lack of Transportation (Non-Medical): No   Physical Activity: Insufficiently Active (09/16/2023)   Exercise Vital Sign    Days of Exercise per Week: 3 days    Minutes of Exercise per Session: 30 min  Stress: No Stress Concern Present (09/16/2023)   [employer] of Occupational Health - Occupational Stress Questionnaire    Feeling of Stress: Only a little  Social Connections: Socially Integrated (09/16/2023)   Social Connection and Isolation Panel    Frequency of Communication with Friends and Family: Twice a week    Frequency of Social Gatherings with Friends and Family: Once a week    Attends Religious Services: More than 4 times per year    Active Member of [employer] or Organizations: Yes    Attends Engineer, structural: More than 4 times per year    Marital Status: Married  Catering manager Violence: Not At Risk (04/30/2023)   Humiliation, Afraid, Rape, and Kick questionnaire    Fear of Current or Ex-Partner: No    Emotionally Abused: No    Physically Abused: No    Sexually Abused: No    Family History  Problem Relation Age of Onset   Hypertension Mother    Dementia Father    Sleep apnea Brother    Healthy Brother    Sleep apnea Paternal Aunt    Asthma Cousin    Healthy Daughter    Healthy Daughter    Sarcoidosis Neg Hx    Colon polyps Neg Hx    Colon cancer Neg Hx    Esophageal cancer Neg Hx    Stomach cancer Neg Hx    Rectal cancer Neg Hx      Review of Systems  Constitutional: Negative.  Negative for chills and fever.  HENT: Negative.  Negative for congestion and sore throat.   Respiratory: Negative.  Negative for cough and shortness of breath.   Cardiovascular: Negative.  Negative for chest pain and palpitations.  Gastrointestinal:  Negative for abdominal pain, nausea and vomiting.  Genitourinary: Negative.  Negative for dysuria and hematuria.  Skin: Negative.   Neurological:  Negative for dizziness and headaches.  All other systems reviewed and are negative.   Vitals:  09/17/23 0837  BP:  122/64  Pulse: 80  Temp: 97.8 F (36.6 C)  SpO2: 96%    Physical Exam Vitals reviewed.  Constitutional:      Appearance: Normal appearance.  HENT:     Head: Normocephalic.     Mouth/Throat:     Mouth: Mucous membranes are moist.     Pharynx: Oropharynx is clear.  Eyes:     Extraocular Movements: Extraocular movements intact.     Pupils: Pupils are equal, round, and reactive to light.  Cardiovascular:     Rate and Rhythm: Normal rate and regular rhythm.     Pulses: Normal pulses.     Heart sounds: Normal heart sounds.  Pulmonary:     Effort: Pulmonary effort is normal.     Breath sounds: Normal breath sounds.  Abdominal:     Palpations: Abdomen is soft.     Tenderness: There is no abdominal tenderness.     Hernia: A hernia (Umbilical hernia.  Nontender.  Reducible) is present.  Musculoskeletal:     Cervical back: No tenderness.  Lymphadenopathy:     Cervical: No cervical adenopathy.  Skin:    General: Skin is warm and dry.     Capillary Refill: Capillary refill takes less than 2 seconds.  Neurological:     General: No focal deficit present.     Mental Status: He is alert and oriented to person, place, and time.  Psychiatric:        Mood and Affect: Mood normal.        Behavior: Behavior normal.      ASSESSMENT & PLAN: A total of 44 minutes was spent with the patient and counseling/coordination of care regarding preparing for this visit, review of most recent office visit notes, review of multiple chronic medical conditions and their management, review of all medications, review of most recent bloodwork results, review of health maintenance items, education on nutrition, prognosis, documentation, and need for follow up.   Problem List Items Addressed This Visit       Cardiovascular and Mediastinum   Primary hypertension - Primary   Well-controlled hypertension. Continue amlodipine  10 mg and valsartan  160 mg daily Cardiovascular risks associated with  hypertension discussed Diet and nutrition discussed.        Respiratory   OSA on CPAP   Stable.  On CPAP treatment. Recently seen by neurologist Office visit notes reviewed        Digestive   NASH (nonalcoholic steatohepatitis)   Stable.  Diet and nutrition discussed.        Endocrine   Hypothyroid   Clinically euthyroid Continues Synthroid  200 mcg daily Lab Results  Component Value Date   TSH 0.14 (L) 03/19/2023           Musculoskeletal and Integument   Chronic idiopathic gout involving toe of left foot without tophus   Clinically stable.  No recent flareups. Last uric acid 5.9        Genitourinary   Stage 3a chronic kidney disease (HCC)   Last GFR 63 Advised to stay well-hydrated and avoid NSAIDs is much as possible        Other   Sarcoidosis   Stable and asymptomatic.      Lower urinary tract symptoms   Much improved with tamsulosin  0.4 mg daily.      Anemia   Recent onset.  Asymptomatic and clinically stable No clinical signs of GI bleed. Colonoscopy report from 2023 reviewed.  No diverticulosis Precancerous polyps and  internal hemorrhoids No rectal bleeding or melena.  Clinically stable. Recent blood work results reviewed Anemia panel done last January showed iron deficiency Ferritin is within normal limits due to chronic inflammatory conditions      Umbilical hernia without obstruction and without gangrene   Clinically stable.  Asymptomatic. May need general surgery evaluation in the near future      Patient Instructions  Health Maintenance After Age 38 After age 29, you are at a higher risk for certain long-term diseases and infections as well as injuries from falls. Falls are a major cause of broken bones and head injuries in people who are older than age 47. Getting regular preventive care can help to keep you healthy and well. Preventive care includes getting regular testing and making lifestyle changes as recommended by your health  care provider. Talk with your health care provider about: Which screenings and tests you should have. A screening is a test that checks for a disease when you have no symptoms. A diet and exercise plan that is right for you. What should I know about screenings and tests to prevent falls? Screening and testing are the best ways to find a health problem early. Early diagnosis and treatment give you the best chance of managing medical conditions that are common after age 27. Certain conditions and lifestyle choices may make you more likely to have a fall. Your health care provider may recommend: Regular vision checks. Poor vision and conditions such as cataracts can make you more likely to have a fall. If you wear glasses, make sure to get your prescription updated if your vision changes. Medicine review. Work with your health care provider to regularly review all of the medicines you are taking, including over-the-counter medicines. Ask your health care provider about any side effects that may make you more likely to have a fall. Tell your health care provider if any medicines that you take make you feel dizzy or sleepy. Strength and balance checks. Your health care provider may recommend certain tests to check your strength and balance while standing, walking, or changing positions. Foot health exam. Foot pain and numbness, as well as not wearing proper footwear, can make you more likely to have a fall. Screenings, including: Osteoporosis screening. Osteoporosis is a condition that causes the bones to get weaker and break more easily. Blood pressure screening. Blood pressure changes and medicines to control blood pressure can make you feel dizzy. Depression screening. You may be more likely to have a fall if you have a fear of falling, feel depressed, or feel unable to do activities that you used to do. Alcohol use screening. Using too much alcohol can affect your balance and may make you more likely to  have a fall. Follow these instructions at home: Lifestyle Do not drink alcohol if: Your health care provider tells you not to drink. If you drink alcohol: Limit how much you have to: 0-1 drink a day for women. 0-2 drinks a day for men. Know how much alcohol is in your drink. In the [place] one drink equals one 12 oz bottle of beer (355 mL), one 5 oz glass of wine (148 mL), or one 1 oz glass of hard liquor (44 mL). Do not use any products that contain nicotine or tobacco. These products include cigarettes, chewing tobacco, and vaping devices, such as e-cigarettes. If you need help quitting, ask your health care provider. Activity  Follow a regular exercise program to stay fit. This will help you  maintain your balance. Ask your health care provider what types of exercise are appropriate for you. If you need a cane or walker, use it as recommended by your health care provider. Wear supportive shoes that have nonskid soles. Safety  Remove any tripping hazards, such as rugs, cords, and clutter. Install safety equipment such as grab bars in bathrooms and safety rails on stairs. Keep rooms and walkways well-lit. General instructions Talk with your health care provider about your risks for falling. Tell your health care provider if: You fall. Be sure to tell your health care provider about all falls, even ones that seem minor. You feel dizzy, tiredness (fatigue), or off-balance. Take over-the-counter and prescription medicines only as told by your health care provider. These include supplements. Eat a healthy diet and maintain a healthy weight. A healthy diet includes low-fat dairy products, low-fat (lean) meats, and fiber from whole grains, beans, and lots of fruits and vegetables. Stay current with your vaccines. Schedule regular health, dental, and eye exams. Summary Having a healthy lifestyle and getting preventive care can help to protect your health and wellness after age 65. Screening  and testing are the best way to find a health problem early and help you avoid having a fall. Early diagnosis and treatment give you the best chance for managing medical conditions that are more common for people who are older than age 2. Falls are a major cause of broken bones and head injuries in people who are older than age 22. Take precautions to prevent a fall at home. Work with your health care provider to learn what changes you can make to improve your health and wellness and to prevent falls. This information is not intended to replace advice given to you by your health care provider. Make sure you discuss any questions you have with your health care provider. Document Revised: 07/19/2020 Document Reviewed: 07/19/2020 Elsevier Patient Education  2024 Elsevier Inc.    Emil Schaumann, MD [facility]

## 2023-09-17 NOTE — Assessment & Plan Note (Signed)
 Clinically stable.  No recent flareups. Last uric acid 5.9

## 2023-09-22 ENCOUNTER — Other Ambulatory Visit: Payer: Self-pay | Admitting: Gastroenterology

## 2023-09-22 DIAGNOSIS — K219 Gastro-esophageal reflux disease without esophagitis: Secondary | ICD-10-CM

## 2023-09-22 DIAGNOSIS — R1310 Dysphagia, unspecified: Secondary | ICD-10-CM

## 2023-09-23 NOTE — Telephone Encounter (Signed)
 Approve this refill, but patient needs a clinical visit with APP for further refills or he can ask his PCP to do it.  Last seen by Aneita Mar 2024  - H. Danis

## 2023-09-24 ENCOUNTER — Other Ambulatory Visit: Payer: Self-pay | Admitting: Emergency Medicine

## 2023-09-24 DIAGNOSIS — I10 Essential (primary) hypertension: Secondary | ICD-10-CM

## 2023-09-24 DIAGNOSIS — E039 Hypothyroidism, unspecified: Secondary | ICD-10-CM

## 2023-09-24 NOTE — Telephone Encounter (Signed)
 30 day supply sent. LMTCB and schedule an appt.

## 2023-10-07 ENCOUNTER — Encounter: Payer: Self-pay | Admitting: Emergency Medicine

## 2023-10-09 ENCOUNTER — Other Ambulatory Visit: Payer: Self-pay | Admitting: Radiology

## 2023-10-10 ENCOUNTER — Other Ambulatory Visit: Payer: Self-pay | Admitting: Radiology

## 2023-10-10 DIAGNOSIS — R29898 Other symptoms and signs involving the musculoskeletal system: Secondary | ICD-10-CM

## 2023-10-22 ENCOUNTER — Other Ambulatory Visit: Payer: Self-pay | Admitting: Gastroenterology

## 2023-10-22 DIAGNOSIS — K219 Gastro-esophageal reflux disease without esophagitis: Secondary | ICD-10-CM

## 2023-10-22 DIAGNOSIS — R1310 Dysphagia, unspecified: Secondary | ICD-10-CM

## 2023-10-23 ENCOUNTER — Ambulatory Visit (INDEPENDENT_AMBULATORY_CARE_PROVIDER_SITE_OTHER): Admitting: Orthopaedic Surgery

## 2023-10-23 ENCOUNTER — Other Ambulatory Visit (INDEPENDENT_AMBULATORY_CARE_PROVIDER_SITE_OTHER): Payer: Self-pay

## 2023-10-23 DIAGNOSIS — G8929 Other chronic pain: Secondary | ICD-10-CM | POA: Diagnosis not present

## 2023-10-23 DIAGNOSIS — M25511 Pain in right shoulder: Secondary | ICD-10-CM | POA: Diagnosis not present

## 2023-10-23 NOTE — Progress Notes (Signed)
 Office Visit Note   Patient: Philip Woodard           Date of Birth: January 27, 1951           MRN: [id number] Visit Date: 10/23/2023              Requested by: Purcell Emil Schanz, MD [address] [place],  [place] [address] PCP: Purcell Emil Schanz, MD   Assessment & Plan: Visit Diagnoses:  1. Chronic right shoulder pain     Plan: History of Present Illness Philip Woodard is a 73 year old male with a history of right shoulder surgery who presents with right shoulder pain.  He has experienced right shoulder pain for six to eight weeks, possibly due to 'dreaming off and sleep fighting.' He underwent a previous rotator cuff repair. Initially, the pain caused significant stiffness and discomfort, which has improved with self-administered therapy. His shoulder remains functional, allowing him to perform tasks slowly. He takes ibuprofen  at night for pain management.  Physical Exam MUSCULOSKELETAL: Right shoulder flexibility is functional.  Significant weakness in right infraspinatus and supraspinatus to manual muscle testing.  Assessment and Plan Chronic right rotator cuff deficiency and right shoulder pain.  Status post 2 rotator cuff surgeries in the last 10 to 15 years. Chronic tear with muscle weakness and superior humeral head migration, indicating chronic cuff insufficiency. - Administer shoulder injection for pain and function improvement. - Refer to physical therapy for muscle strengthening and function enhancement. - Advise tapering off ibuprofen  post-injection relief.  Follow-Up Instructions: No follow-ups on file.   Orders:  Orders Placed This Encounter  Procedures   XR Shoulder Right   Ambulatory referral to Physical Therapy   No orders of the defined types were placed in this encounter.     Procedures: No procedures performed   Clinical Data: No additional findings.   Subjective: Chief Complaint  Patient presents with   Right Shoulder - Pain     HPI  Review of Systems  Constitutional: Negative.   HENT: Negative.    Eyes: Negative.   Respiratory: Negative.    Cardiovascular: Negative.   Gastrointestinal: Negative.   Endocrine: Negative.   Genitourinary: Negative.   Skin: Negative.   Allergic/Immunologic: Negative.   Neurological: Negative.   Hematological: Negative.   Psychiatric/Behavioral: Negative.    All other systems reviewed and are negative.    Objective: Vital Signs: There were no vitals taken for this visit.  Physical Exam Vitals and nursing note reviewed.  Constitutional:      Appearance: He is well-developed.  HENT:     Head: Normocephalic and atraumatic.  Eyes:     Pupils: Pupils are equal, round, and reactive to light.  Pulmonary:     Effort: Pulmonary effort is normal.  Abdominal:     Palpations: Abdomen is soft.  Musculoskeletal:        General: Normal range of motion.     Cervical back: Neck supple.  Skin:    General: Skin is warm.  Neurological:     Mental Status: He is alert and oriented to person, place, and time.  Psychiatric:        Behavior: Behavior normal.        Thought Content: Thought content normal.        Judgment: Judgment normal.     Ortho Exam  Specialty Comments:  No specialty comments available.  Imaging: XR Shoulder Right Result Date: 10/23/2023 X-rays of the right shoulder show degenerative irregularity of  the greater tuberosity consistent with prior rotator cuff surgery.  Widened AC joint consistent with prior distal clavicle excision.  Degenerative spurring of the acromion.  High riding humeral head.    PMFS History: Patient Active Problem List   Diagnosis Date Noted   Umbilical hernia without obstruction and without gangrene 09/17/2023   Anemia 03/19/2023   Bilateral carpal tunnel syndrome 08/21/2022   NCGS (non-celiac gluten sensitivity) 01/25/2022   Internal hemorrhoids 01/25/2022   Cervical radiculopathy 10/17/2021   Lower urinary tract  symptoms 07/20/2021   Chronic idiopathic gout involving toe of left foot without tophus 04/27/2021   Stage 3a chronic kidney disease (HCC) 03/31/2021   Erectile dysfunction due to arterial insufficiency 03/31/2021   NASH (nonalcoholic steatohepatitis) 02/22/2015   Arthritis of right lower extremity 07/20/2014   Hearing loss 02/19/2014   OSA on CPAP 05/02/2013   Hypothyroid 03/29/2012   Primary hypertension 11/24/2008   HYPOTHYROIDISM, POSTSURGICAL 07/07/2008   HYPERTROPHY PROSTATE W/UR OBST & OTH LUTS 07/07/2008   DISC DISEASE, LUMBAR 12/27/2007   Sarcoidosis 06/06/2007   GOITER, MULTINODULAR 06/06/2007   Hemorrhoids 06/06/2007   Asthma 06/06/2007   Past Medical History:  Diagnosis Date   Alkaline phosphatase raised 06/28/2011   Fluctuating - suspect due to sarcoidosis   Allergic rhinitis    Allergy     Arthritis    knees   Genital herpes    GERD (gastroesophageal reflux disease)    Hemorrhoids    Hyperglycemia    Hyperplastic lymph node 06/27/2011   Submental node excised 1/02; regrowth: re-excision 10/06   Hypertension    Hypothyroidism    Multinodular goiter    Rheumatoid arteritis (HCC)    Routine general medical examination at a health care facility    Sarcoidosis    Sleep apnea    wears cpap     Family History  Problem Relation Age of Onset   Hypertension Mother    Dementia Father    Sleep apnea Brother    Healthy Brother    Sleep apnea Paternal Aunt    Asthma Cousin    Healthy Daughter    Healthy Daughter    Sarcoidosis Neg Hx    Colon polyps Neg Hx    Colon cancer Neg Hx    Esophageal cancer Neg Hx    Stomach cancer Neg Hx    Rectal cancer Neg Hx     Past Surgical History:  Procedure Laterality Date   COLONOSCOPY  5-23/2003   COLONOSCOPY  02/04/2018   TA   COLONOSCOPY WITH PROPOFOL  05/26/2021   Gwend[facility] Buddy at [facility]   POLYPECTOMY     ROTATOR CUFF REPAIR Bilateral [id number]   THYROIDECTOMY  2002   Social History   Occupational History    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

## 2023-10-26 DIAGNOSIS — D869 Sarcoidosis, unspecified: Secondary | ICD-10-CM | POA: Diagnosis not present

## 2023-10-26 DIAGNOSIS — I129 Hypertensive chronic kidney disease with stage 1 through stage 4 chronic kidney disease, or unspecified chronic kidney disease: Secondary | ICD-10-CM | POA: Diagnosis not present

## 2023-10-26 DIAGNOSIS — N1831 Chronic kidney disease, stage 3a: Secondary | ICD-10-CM | POA: Diagnosis not present

## 2023-10-26 DIAGNOSIS — M109 Gout, unspecified: Secondary | ICD-10-CM | POA: Diagnosis not present

## 2023-10-26 DIAGNOSIS — D472 Monoclonal gammopathy: Secondary | ICD-10-CM | POA: Diagnosis not present

## 2023-11-04 ENCOUNTER — Other Ambulatory Visit: Payer: Self-pay | Admitting: Gastroenterology

## 2023-11-04 DIAGNOSIS — R1310 Dysphagia, unspecified: Secondary | ICD-10-CM

## 2023-11-04 DIAGNOSIS — K219 Gastro-esophageal reflux disease without esophagitis: Secondary | ICD-10-CM

## 2023-11-11 ENCOUNTER — Other Ambulatory Visit: Payer: Self-pay | Admitting: Emergency Medicine

## 2023-11-19 ENCOUNTER — Ambulatory Visit: Admitting: Physical Therapy

## 2023-11-19 ENCOUNTER — Encounter: Payer: Self-pay | Admitting: Physical Therapy

## 2023-11-19 DIAGNOSIS — R600 Localized edema: Secondary | ICD-10-CM

## 2023-11-19 DIAGNOSIS — M6281 Muscle weakness (generalized): Secondary | ICD-10-CM

## 2023-11-19 DIAGNOSIS — M25511 Pain in right shoulder: Secondary | ICD-10-CM | POA: Diagnosis not present

## 2023-11-19 NOTE — Therapy (Signed)
 OUTPATIENT PHYSICAL THERAPY SHOULDER EVALUATION   Patient Name: Philip Woodard MRN: [id number] DOB:[birth year], 73 y.o., male Today's Date: 11/19/2023  END OF SESSION:  PT End of Session - 11/19/23 1652     Visit Number 1    Number of Visits 10    Date for PT Re-Evaluation 02/01/24    Authorization Type UHC/ Medicare    PT Start Time 1608    PT Stop Time 1648    PT Time Calculation (min) 40 min    Activity Tolerance Patient tolerated treatment well    Behavior During Therapy [facility] for tasks assessed/performed          Past Medical History:  Diagnosis Date   Alkaline phosphatase raised 06/28/2011   Fluctuating - suspect due to sarcoidosis   Allergic rhinitis    Allergy     Arthritis    knees   Genital herpes    GERD (gastroesophageal reflux disease)    Hemorrhoids    Hyperglycemia    Hyperplastic lymph node 06/27/2011   Submental node excised 1/02; regrowth: re-excision 10/06   Hypertension    Hypothyroidism    Multinodular goiter    Rheumatoid arteritis (HCC)    Routine general medical examination at a health care facility    Sarcoidosis    Sleep apnea    wears cpap    Past Surgical History:  Procedure Laterality Date   COLONOSCOPY  5-23/2003   COLONOSCOPY  02/04/2018   TA   COLONOSCOPY WITH PROPOFOL  05/26/2021   [facility] Buddy at [facility]   POLYPECTOMY     ROTATOR CUFF REPAIR Bilateral [id number]   THYROIDECTOMY  2002   Patient Active Problem List   Diagnosis Date Noted   Umbilical hernia without obstruction and without gangrene 09/17/2023   Anemia 03/19/2023   Bilateral carpal tunnel syndrome 08/21/2022   NCGS (non-celiac gluten sensitivity) 01/25/2022   Internal hemorrhoids 01/25/2022   Cervical radiculopathy 10/17/2021   Lower urinary tract symptoms 07/20/2021   Chronic idiopathic gout involving toe of left foot without tophus 04/27/2021   Stage 3a chronic kidney disease (HCC) 03/31/2021   Erectile dysfunction due to arterial insufficiency 03/31/2021    NASH (nonalcoholic steatohepatitis) 02/22/2015   Arthritis of right lower extremity 07/20/2014   Hearing loss 02/19/2014   OSA on CPAP 05/02/2013   Hypothyroid 03/29/2012   Primary hypertension 11/24/2008   HYPOTHYROIDISM, POSTSURGICAL 07/07/2008   HYPERTROPHY PROSTATE W/UR OBST & OTH LUTS 07/07/2008   DISC DISEASE, LUMBAR 12/27/2007   Sarcoidosis 06/06/2007   GOITER, MULTINODULAR 06/06/2007   Hemorrhoids 06/06/2007   Asthma 06/06/2007    PCP: Purcell Emil Schanz, MD   REFERRING PROVIDER: Jerri Kay HERO, MD   REFERRING DIAG:  Diagnosis  M25.511,G89.29 (ICD-10-CM) - Chronic right shoulder pain    THERAPY DIAG:  Acute pain of right shoulder  Muscle weakness (generalized)  Localized edema  Rationale for Evaluation and Treatment: Rehabilitation  ONSET DATE: 6 months  SUBJECTIVE:  SUBJECTIVE STATEMENT: Pt arriving today with 6/10 pain in his Rt UE. Pt reporting diffictuly with lifting his Rt UE. Pt stating pain can wake him up at night if he turns on his Rt side.   PERTINENT HISTORY: History of Rotator cuff surgeries bil. HTN, RA, see PM above  PAIN:  NPRS scale: 6/10, worse pain 8/10 Pain location: Rt UE Pain description: achy, stiffness, sharp at times Aggravating factors: raising Rt arm Relieving factors: ice  PRECAUTIONS: None  WEIGHT BEARING RESTRICTIONS: No  FALLS:  Has patient fallen in last 6 months? Yes. 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  Widened AC joint  consistent with prior distal clavicle excision.  Degenerative spurring of  the acromion.  High riding humeral head.   PATIENT SURVEYS:  Patient-Specific Activity Scoring Scheme  0 represents "unable to perform." 10 represents "able to perform at prior level. 0 1 2 3 4 5 6 7 8 9  10 (Date and Score)   Activity Eval  11/19/23    1. Lifting the Rt arm (>8.5 milk) 2     2. Lift arm easily 6     3. Reaching behind me going through belt loops 6   4.    5.    Score 4.6    Total score = sum of the activity scores/number of activities Minimum detectable change (90%CI) for average score = 2 points Minimum detectable change (90%CI) for single activity score = 3 points  COGNITION: Overall cognitive status: [facility]      UPPER EXTREMITY ROM:   ROM Right Eval 11/19/23 active Left Eval 11/19/23 active  Shoulder flexion 55 c pain 158  Shoulder extension 60 65  Shoulder abduction 80 c pain 156  Shoulder adduction    Shoulder internal rotation    Shoulder external rotation 48 65  Elbow flexion    Elbow extension    Wrist flexion    Wrist extension    Wrist ulnar deviation    Wrist radial deviation    Wrist pronation    Wrist supination    (Blank rows = not tested)  UPPER EXTREMITY MMT:  MMT Right Eval 11/19/23 Left Eval 11/19/23  Shoulder flexion 4.2# 23.4#  Shoulder extension    Shoulder abduction 4.3# 15.4#  Shoulder adduction    Shoulder internal rotation    Shoulder external rotation 5.0# 13.3#  Middle trapezius    Lower trapezius    Elbow  flexion    Elbow extension    Wrist flexion    Wrist extension    Wrist ulnar deviation    Wrist radial deviation    Wrist pronation    Wrist supination    Grip strength (lbs)    (Blank rows = not tested)  SHOULDER SPECIAL TESTS: Impingement tests: Hawkins/Kennedy impingement test: positive  TODAY'S TREATMENT:                                                                                                       DATE: 11/19/23 Therex:    HEP instruction/performance c cues for techniques, handout provided.  Trial set performed of each for comprehension and symptom assessment.  See below for exercise list.  Self care:  Sleeping positions using pillows for support   PATIENT EDUCATION: Education details: HEP, POC Person educated: Patient Education method: Explanation, Demonstration, Verbal cues, and Handouts Education comprehension: verbalized understanding, returned demonstration, and verbal cues required  HOME EXERCISE PROGRAM: Exercises - Supine Shoulder Flexion Extension AAROM with Dowel  - 2 x daily - 2 sets - 10 reps - Supine Shoulder External Rotation with Dowel  - 2 x daily - 2 sets - 10 reps - Standing Shoulder Internal Rotation Stretch with Towel  - 2 x daily - 7 x weekly - 10 reps - 3 seconds hold - Standing Shoulder Row with Anchored Resistance  - 2 x daily - 2 sets - 10 reps - 3 seconds hold  ASSESSMENT:  CLINICAL IMPRESSION: Patient is a 74 y.o. who comes to clinic with complaints of Rt shoulder pain with mobility, strength and movement coordination deficits that impair their ability to perform usual daily and recreational functional activities without increase difficulty/symptoms at this time.  Patient to benefit from skilled PT services to address impairments and limitations  to improve to previous level of function without restriction secondary to condition.   OBJECTIVE IMPAIRMENTS: decreased ROM, decreased strength, impaired UE functional use, and pain.   ACTIVITY LIMITATIONS: lifting, dressing, reach over head, and hygiene/grooming  PARTICIPATION LIMITATIONS: driving, community activity, occupation, and yard work  PERSONAL FACTORS: 3+ comorbidities: see PMH above are also affecting patient's functional outcome.   REHAB POTENTIAL: Good  CLINICAL DECISION MAKING: Stable/uncomplicated  EVALUATION COMPLEXITY: Low   GOALS: Goals reviewed with patient? Yes  SHORT TERM GOALS: (target date for Short term goals are 3 weeks 12/10/2023)  1.Patient will demonstrate independent use of home exercise program to maintain progress from in clinic treatments. Goal status: New  LONG TERM GOALS: (target dates for all long term goals are 10 weeks  02/01/2024 )   1. Patient will demonstrate/report pain at worst less than or equal to 2/10 to facilitate minimal limitation in daily activity secondary to pain symptoms. Goal status: New   2. Patient will demonstrate independent use of home exercise program to facilitate ability to maintain/progress functional gains from skilled physical therapy services. Goal status: New   3. Patient will demonstrate Patient specific functional scale avg > or = 6.6 to indicate reduced disability due to condition.  Goal status: New   4.  Patient will demonstrate Rt UE MMT >/= 5 pounds throughout to facilitate lifting, reaching, carrying at PLOF in daily activity.   Goal status: New   5.  Patient will demonstrate Rt GH joint AROM WFL s symptoms to facilitate usual overhead reaching, self care, dressing at PLOF.    Goal status: New   6. Pt  will improve Rt shoulder ER to >/=  65 degrees to facilitate functional mobility tasks.  Goal status: New     PLAN:  PT FREQUENCY: 1x/week  PT DURATION: 10 weeks  PLANNED INTERVENTIONS: Can  include 02853- PT Re-evaluation, 97110-Therapeutic exercises, 97530- Therapeutic activity, [id number]- Neuromuscular re-education, 97535- Self Care, 97140- Manual therapy, [phone]- Gait training, [phone]- Orthotic Fit/training, [phone]- Canalith repositioning, [id number]- Aquatic Therapy, [phone]- Electrical stimulation (unattended), [id number] Physical performance testing, 97016- Vasopneumatic device, [id number]- Ultrasound, [id number]- Traction (mechanical), [id number]- Ionotophoresis 4mg /ml Dexamethasone,  20560 - Needle insertion w/o injection 1 or 2 muscles, 20561 - Needle insertion w/o injection 3 or more muscles.   Patient/Family education, Balance training, Stair training, Taping, Dry Needling, Joint mobilization, Joint manipulation, Spinal manipulation, Spinal mobilization, Scar mobilization, Vestibular training, Visual/preceptual remediation/compensation, DME instructions, Cryotherapy, and Moist heat.  All performed as medically necessary.  All included unless contraindicated  PLAN FOR NEXT SESSION: Review HEP knowledge/results, shoulder ROM,  Add isometrics next visit.     Delon JONELLE Lunger, PT, MPT 11/19/2023, 4:59 PM  Date of referral: 10/23/23 Referring provider: Jerri Kay HERO, MD  Referring diagnosis? M25.511,G89.29 (ICD-10-CM) - Chronic right shoulder pain Treatment diagnosis? (if different than referring diagnosis) M25.11, M62.81, R60.0  What was this (referring dx) caused by? Fall and Arthritis  Nature of Condition: Initial Onset (within last 3 months)   Laterality: Rt  Current Functional Measure Score: Patient Specific Functional Scale 4.6  Objective measurements identify impairments when they are compared to normal values, the uninvolved extremity, and prior level of function.  [x]  Yes  []  No  Objective assessment of functional ability: Moderate functional limitations in Rt UE   Briefly describe symptoms: Decreased ROM, strength and functional mobility with pain noted with movements in Rt UE  How did  symptoms start: Pt reporting a couple of falls out of his bed during a dream where he fell on his Rt shoulder.   Average pain intensity:  Last 24 hours: 6/10  Past week: up to 8/10  How often does the pt experience symptoms? Frequently  How much have the symptoms interfered with usual daily activities? Moderately  How has condition changed since care began at this facility? NA - initial visit  In general, how is the patients overall health? Good   BACK PAIN (STarT Back Screening Tool) No

## 2023-12-04 ENCOUNTER — Encounter: Admitting: Physical Therapy

## 2023-12-09 NOTE — Therapy (Incomplete)
 OUTPATIENT PHYSICAL THERAPY SHOULDER TREATMENT   Patient Name: Philip Woodard MRN: [id number] DOB:03-Apr-1950, 73 y.o., male Today's Date: 12/09/2023  END OF SESSION:***    Past Medical History:  Diagnosis Date   Alkaline phosphatase raised 06/28/2011   Fluctuating - suspect due to sarcoidosis   Allergic rhinitis    Allergy     Arthritis    knees   Genital herpes    GERD (gastroesophageal reflux disease)    Hemorrhoids    Hyperglycemia    Hyperplastic lymph node 06/27/2011   Submental node excised 1/02; regrowth: re-excision 10/06   Hypertension    Hypothyroidism    Multinodular goiter    Rheumatoid arteritis (HCC)    Routine general medical examination at a health care facility    Sarcoidosis    Sleep apnea    wears cpap    Past Surgical History:  Procedure Laterality Date   COLONOSCOPY  5-23/2003   COLONOSCOPY  02/04/2018   TA   COLONOSCOPY WITH PROPOFOL  05/26/2021   Gwendlyn Buddy at [facility]   POLYPECTOMY     ROTATOR CUFF REPAIR Bilateral [id number]   THYROIDECTOMY  2002   Patient Active Problem List   Diagnosis Date Noted   Umbilical hernia without obstruction and without gangrene 09/17/2023   Anemia 03/19/2023   Bilateral carpal tunnel syndrome 08/21/2022   NCGS (non-celiac gluten sensitivity) 01/25/2022   Internal hemorrhoids 01/25/2022   Cervical radiculopathy 10/17/2021   Lower urinary tract symptoms 07/20/2021   Chronic idiopathic gout involving toe of left foot without tophus 04/27/2021   Stage 3a chronic kidney disease (HCC) 03/31/2021   Erectile dysfunction due to arterial insufficiency 03/31/2021   NASH (nonalcoholic steatohepatitis) 02/22/2015   Arthritis of right lower extremity 07/20/2014   Hearing loss 02/19/2014   OSA on CPAP 05/02/2013   Hypothyroid 03/29/2012   Primary hypertension 11/24/2008   HYPOTHYROIDISM, POSTSURGICAL 07/07/2008   HYPERTROPHY PROSTATE W/UR OBST & OTH LUTS 07/07/2008   DISC DISEASE, LUMBAR 12/27/2007   Sarcoidosis  06/06/2007   GOITER, MULTINODULAR 06/06/2007   Hemorrhoids 06/06/2007   Asthma 06/06/2007    PCP: Purcell Emil Schanz, MD   REFERRING PROVIDER: Purcell Emil Schanz, *   REFERRING DIAG:  Diagnosis  M25.511,G89.29 (ICD-10-CM) - Chronic right shoulder pain    THERAPY DIAG:  No diagnosis found.  Rationale for Evaluation and Treatment: Rehabilitation  ONSET DATE: 6 months  SUBJECTIVE:  SUBJECTIVE STATEMENT: ***Pt arriving today with 6/10 pain in his Rt UE. Pt reporting diffictuly with lifting his Rt UE. Pt stating pain can wake him up at night if he turns on his Rt side.   PERTINENT HISTORY: History of Rotator cuff surgeries bil. HTN, RA, see PM above  PAIN:  ***NPRS scale: 6/10, worse pain 8/10 Pain location: Rt UE Pain description: achy, stiffness, sharp at times Aggravating factors: raising Rt arm Relieving factors: ice  PRECAUTIONS: None  WEIGHT BEARING RESTRICTIONS: No  FALLS:  Has patient fallen in last 6 months? Yes. 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  Widened AC joint  consistent with prior distal clavicle excision.  Degenerative spurring of  the acromion.  High riding humeral head.   PATIENT SURVEYS:  Patient-Specific Activity Scoring Scheme  0 represents "unable to perform." 10 represents "able to perform at prior level. 0 1 2 3 4 5 6 7 8 9  10 (Date and Score)   Activity Eval  11/19/23    1. Lifting the Rt arm (>8.5 milk) 2     2. Lift arm easily 6     3. Reaching behind me going through belt loops 6   4.    5.    Score 4.6    Total score = sum of the activity scores/number of activities Minimum detectable change (90%CI) for average score = 2 points Minimum detectable change (90%CI) for single activity score = 3 points  COGNITION: Overall cognitive status: [facility]      UPPER EXTREMITY ROM:   ROM Right Eval 11/19/23 active Left Eval 11/19/23 active  Shoulder flexion 55 c pain 158  Shoulder extension 60 65  Shoulder abduction 80 c pain 156  Shoulder adduction    Shoulder internal rotation    Shoulder external rotation 48 65  Elbow flexion    Elbow extension    Wrist flexion    Wrist extension    Wrist ulnar deviation    Wrist radial deviation    Wrist pronation    Wrist supination    (Blank rows = not tested)  UPPER EXTREMITY MMT:  MMT Right Eval 11/19/23 Left Eval 11/19/23  Shoulder flexion 4.2# 23.4#  Shoulder extension    Shoulder abduction 4.3# 15.4#  Shoulder adduction    Shoulder internal rotation    Shoulder external rotation 5.0# 13.3#  Middle trapezius    Lower trapezius    Elbow flexion    Elbow extension    Wrist flexion    Wrist extension    Wrist ulnar deviation    Wrist radial deviation    Wrist pronation    Wrist supination    Grip strength (lbs)    (Blank rows = not tested)  SHOULDER SPECIAL TESTS: Impingement tests: Hawkins/Kennedy impingement test: positive  TODAY'S TREATMENT:                                                                                                        12/10/23***R Shoulder     DATE: 11/19/23 Therex:    HEP instruction/performance c cues for techniques, handout provided.  Trial set performed of each for comprehension and symptom assessment.  See below for exercise list.  Self care:  Sleeping positions using pillows for support   PATIENT EDUCATION: Education details: HEP, POC Person educated: Patient Education method: Explanation, Demonstration, Verbal cues, and Handouts Education comprehension: verbalized understanding, returned demonstration, and verbal cues required  HOME EXERCISE PROGRAM: Exercises - Supine Shoulder Flexion Extension AAROM with Dowel  - 2 x daily - 2 sets - 10 reps - Supine Shoulder External Rotation with Dowel  - 2 x daily - 2 sets - 10 reps - Standing Shoulder Internal Rotation Stretch with Towel  - 2 x daily - 7 x weekly - 10 reps - 3 seconds hold - Standing Shoulder Row with Anchored Resistance  - 2 x daily - 2 sets - 10 reps - 3 seconds hold  ASSESSMENT:  CLINICAL IMPRESSION: ***Patient is a 73 y.o. who comes to clinic with complaints of Rt shoulder pain with mobility, strength and movement coordination deficits that impair their ability to perform usual daily and recreational functional activities without increase difficulty/symptoms at this time.  Patient to benefit from skilled PT services to address impairments and limitations to improve to previous level of function without restriction secondary to condition.   OBJECTIVE IMPAIRMENTS: decreased ROM, decreased strength, impaired UE functional use, and pain.   ACTIVITY LIMITATIONS: lifting, dressing, reach over head, and hygiene/grooming  PARTICIPATION LIMITATIONS: driving, community activity, occupation, and yard work  PERSONAL FACTORS:  3+ comorbidities: see PMH above are also affecting patient's functional outcome.   REHAB POTENTIAL: Good  CLINICAL DECISION MAKING: Stable/uncomplicated  EVALUATION COMPLEXITY: Low   GOALS: Goals reviewed with patient? Yes  SHORT TERM GOALS: (target date for Short term goals are 3 weeks 12/10/2023)***  1.Patient will demonstrate independent use of home exercise program to maintain progress from in clinic treatments. Goal status: New  LONG TERM GOALS: (target dates for all long term goals are 10 weeks  02/01/2024 )   1. Patient will demonstrate/report pain at worst less than or equal to 2/10 to facilitate minimal limitation in daily activity secondary to pain symptoms. Goal status: New   2. Patient will demonstrate independent use of home exercise program to facilitate ability to maintain/progress functional gains from skilled physical therapy services. Goal status: New   3. Patient will demonstrate Patient specific functional scale avg > or = 6.6 to indicate reduced disability due to condition.  Goal status: New   4.  Patient will demonstrate Rt UE MMT >/= 5 pounds throughout to facilitate lifting, reaching, carrying at PLOF in daily activity.   Goal status: New   5.  Patient will demonstrate Rt GH joint AROM WFL s symptoms to facilitate usual overhead reaching, self care, dressing at PLOF.  Goal status: New   6. Pt will improve Rt shoulder ER to >/=  65 degrees to facilitate functional mobility tasks.  Goal status: New     PLAN:  PT FREQUENCY: 1x/week  PT DURATION: 10 weeks  PLANNED INTERVENTIONS: Can include 02853- PT Re-evaluation, 97110-Therapeutic exercises, 97530- Therapeutic activity, [id number]- Neuromuscular re-education, 97535- Self Care, 97140- Manual therapy, [phone]- Gait training, [phone]- Orthotic Fit/training, [phone]- Canalith repositioning, [id number]- Aquatic Therapy, [phone]- Electrical stimulation (unattended), [id number] Physical performance testing, 97016-  Vasopneumatic device, [id number]- Ultrasound, [id number]- Traction (mechanical), [id number]- Ionotophoresis 4mg /ml Dexamethasone,  20560 - Needle insertion w/o injection 1 or 2 muscles, 20561 - Needle insertion w/o injection 3 or more muscles.   Patient/Family education, Balance training, Stair training, Taping, Dry Needling, Joint mobilization, Joint manipulation, Spinal manipulation, Spinal mobilization, Scar mobilization, Vestibular training, Visual/preceptual remediation/compensation, DME instructions, Cryotherapy, and Moist heat.  All performed as medically necessary.  All included unless contraindicated  PLAN FOR NEXT SESSION:*** Review HEP knowledge/results, shoulder ROM,  Add isometrics next visit.     Burnard Meth, PT 12/09/23  10:38 AM    Date of referral: 10/23/23 Referring provider: Purcell Emil Schanz, *  Referring diagnosis? M25.511,G89.29 (ICD-10-CM) - Chronic right shoulder pain Treatment diagnosis? (if different than referring diagnosis) M25.11, M62.81, R60.0  What was this (referring dx) caused by? Fall and Arthritis  Nature of Condition: Initial Onset (within last 3 months)   Laterality: Rt  Current Functional Measure Score: Patient Specific Functional Scale 4.6  Objective measurements identify impairments when they are compared to normal values, the uninvolved extremity, and prior level of function.  [x]  Yes  []  No  Objective assessment of functional ability: Moderate functional limitations in Rt UE   Briefly describe symptoms: Decreased ROM, strength and functional mobility with pain noted with movements in Rt UE  How did symptoms start: Pt reporting a couple of falls out of his bed during a dream where he fell on his Rt shoulder.   Average pain intensity:  Last 24 hours: 6/10  Past week: up to 8/10  How often does the pt experience symptoms? Frequently  How much have the symptoms interfered with usual daily activities? Moderately  How has condition changed since  care began at this facility? NA - initial visit  In general, how is the patients overall health? Good   BACK PAIN (STarT Back Screening Tool) No

## 2023-12-10 ENCOUNTER — Encounter

## 2023-12-10 ENCOUNTER — Telehealth: Payer: Self-pay

## 2023-12-10 NOTE — Telephone Encounter (Signed)
 Called and LMOVM about missed appointment on 12/10/23 at 4pm.  Left info on message about next appointment 12/19/23.

## 2023-12-19 ENCOUNTER — Encounter: Admitting: Rehabilitative and Restorative Service Providers"

## 2023-12-24 ENCOUNTER — Ambulatory Visit: Admitting: Physical Therapy

## 2023-12-24 ENCOUNTER — Encounter: Payer: Self-pay | Admitting: Physical Therapy

## 2023-12-24 DIAGNOSIS — M6281 Muscle weakness (generalized): Secondary | ICD-10-CM

## 2023-12-24 DIAGNOSIS — R293 Abnormal posture: Secondary | ICD-10-CM

## 2023-12-24 DIAGNOSIS — M542 Cervicalgia: Secondary | ICD-10-CM

## 2023-12-24 DIAGNOSIS — R600 Localized edema: Secondary | ICD-10-CM

## 2023-12-24 DIAGNOSIS — M25511 Pain in right shoulder: Secondary | ICD-10-CM | POA: Diagnosis not present

## 2023-12-24 NOTE — Therapy (Addendum)
 OUTPATIENT PHYSICAL THERAPY SHOULDER TREATMENT   Patient Name: Philip Woodard MRN: [id number] DOB:1950/07/19, 73 y.o., male Today's Date: 12/24/2023  END OF SESSION:  PT End of Session - 12/24/23 1612     Visit Number 2    Number of Visits 10    Date for Recertification  02/01/24    Authorization Type UHC/ Medicare    PT Start Time 1605    PT Stop Time 1645    PT Time Calculation (min) 40 min    Activity Tolerance Patient tolerated treatment well           Past Medical History:  Diagnosis Date   Alkaline phosphatase raised 06/28/2011   Fluctuating - suspect due to sarcoidosis   Allergic rhinitis    Allergy     Arthritis    knees   Genital herpes    GERD (gastroesophageal reflux disease)    Hemorrhoids    Hyperglycemia    Hyperplastic lymph node 06/27/2011   Submental node excised 1/02; regrowth: re-excision 10/06   Hypertension    Hypothyroidism    Multinodular goiter    Rheumatoid arteritis (HCC)    Routine general medical examination at a health care facility    Sarcoidosis    Sleep apnea    wears cpap    Past Surgical History:  Procedure Laterality Date   COLONOSCOPY  5-23/2003   COLONOSCOPY  02/04/2018   TA   COLONOSCOPY WITH PROPOFOL  05/26/2021   Gwendlyn Buddy at [facility]   POLYPECTOMY     ROTATOR CUFF REPAIR Bilateral [id number]   THYROIDECTOMY  2002   Patient Active Problem List   Diagnosis Date Noted   Umbilical hernia without obstruction and without gangrene 09/17/2023   Anemia 03/19/2023   Bilateral carpal tunnel syndrome 08/21/2022   NCGS (non-celiac gluten sensitivity) 01/25/2022   Internal hemorrhoids 01/25/2022   Cervical radiculopathy 10/17/2021   Lower urinary tract symptoms 07/20/2021   Chronic idiopathic gout involving toe of left foot without tophus 04/27/2021   Stage 3a chronic kidney disease (HCC) 03/31/2021   Erectile dysfunction due to arterial insufficiency 03/31/2021   NASH (nonalcoholic steatohepatitis) 02/22/2015    Arthritis of right lower extremity 07/20/2014   Hearing loss 02/19/2014   OSA on CPAP 05/02/2013   Hypothyroid 03/29/2012   Primary hypertension 11/24/2008   HYPOTHYROIDISM, POSTSURGICAL 07/07/2008   HYPERTROPHY PROSTATE W/UR OBST & OTH LUTS 07/07/2008   DISC DISEASE, LUMBAR 12/27/2007   Sarcoidosis 06/06/2007   GOITER, MULTINODULAR 06/06/2007   Hemorrhoids 06/06/2007   Asthma 06/06/2007    PCP: Purcell Emil Schanz, MD   REFERRING PROVIDER: Jerri Kay HERO, MD   REFERRING DIAG:  Diagnosis  M25.511,G89.29 (ICD-10-CM) - Chronic right shoulder pain    THERAPY DIAG:  Acute pain of right shoulder  Muscle weakness (generalized)  Localized edema  Cervicalgia  Abnormal posture  Rationale for Evaluation and Treatment: Rehabilitation  ONSET DATE: 6 months  SUBJECTIVE:  SUBJECTIVE STATEMENT: Pt reporting 5-6/10 pain in his Rt shoulder and neck 3-4/10.   PERTINENT HISTORY: History of Rotator cuff surgeries bil. HTN, RA, see PM above  PAIN:  NPRS scale: 5-6/10, Rt shoulder, 3-4/10 in cervical spine Pain location: Rt UE Pain description: achy, stiffness, sharp at times Aggravating factors: raising Rt arm Relieving factors: ice  PRECAUTIONS: None  WEIGHT BEARING RESTRICTIONS: No  FALLS:  Has patient fallen in last 6 months? Yes. 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  Widened AC joint  consistent with prior distal clavicle excision.  Degenerative spurring of  the acromion.  High riding humeral head.   PATIENT SURVEYS:  Patient-Specific Activity Scoring Scheme  0 represents "unable to perform." 10 represents "able to perform at prior level. 0 1 2 3 4 5 6 7 8 9  10 (Date and Score)   Activity Eval  11/19/23    1. Lifting the Rt arm (>8.5 milk) 2     2. Lift arm easily 6     3. Reaching behind me going through belt loops 6   4.    5.    Score 4.6    Total score = sum of the activity scores/number of activities Minimum detectable change (90%CI) for average score = 2 points Minimum detectable change (90%CI) for single activity score = 3 points  COGNITION: Overall cognitive status: [facility]      UPPER EXTREMITY ROM:   ROM Right Eval 11/19/23 active Left Eval 11/19/23 active  Shoulder flexion 55 c pain 158  Shoulder extension 60 65  Shoulder abduction 80 c pain 156  Shoulder adduction    Shoulder internal rotation    Shoulder external rotation 48 65  Elbow flexion    Elbow extension    Wrist flexion    Wrist extension    Wrist ulnar deviation    Wrist radial deviation    Wrist pronation    Wrist supination    (Blank rows = not tested)  UPPER EXTREMITY MMT:  MMT Right Eval 11/19/23 Left Eval 11/19/23  Shoulder flexion 4.2# 23.4#  Shoulder extension    Shoulder abduction 4.3# 15.4#  Shoulder adduction    Shoulder internal rotation    Shoulder external rotation 5.0# 13.3#  Middle trapezius    Lower trapezius    Elbow flexion    Elbow extension    Wrist flexion    Wrist extension    Wrist ulnar deviation     Wrist radial deviation    Wrist pronation    Wrist supination    Grip strength (lbs)    (Blank rows = not tested)  SHOULDER SPECIAL TESTS: Impingement tests: Hawkins/Kennedy impingement test: positive  TODAY'S TREATMENT:                                                                                                        12/24/23 TherEx Shoulder isometrics x 10 holding 10 sec (flexion, extension, ER and IR) Upper trap stretch: x 5 holding 10 sec Levator scapulae stretch x 5 holding 10 sec Supine ER, using bar 2  x 10  TherAct UBE: 3 minutes each direction Level 2  Rows: blue TB x 15 holding 3 sec Shoulder extension: x 15 holding 3 sec Standing shoulder flexion c 1 # bar  2 x 10  Standing shoulder abduction  2 x 10 c cues to     DATE: 11/19/23 Therex:    HEP instruction/performance c cues for techniques, handout provided.  Trial set performed of each for comprehension and symptom assessment.  See below for exercise list.  Self care:  Sleeping positions using pillows for support   PATIENT EDUCATION: Education details: HEP, POC Person educated: Patient Education method: Explanation, Demonstration, Verbal cues, and Handouts Education comprehension: verbalized understanding, returned demonstration, and verbal cues required  HOME EXERCISE PROGRAM: Access Code: [email or web address] URL: [email or web address] Date: 12/24/2023 Prepared by: [name]  Exercises - Supine Shoulder External Rotation in 45 Degrees Abduction AAROM with Dowel  - 1-2 x daily - 7 x weekly - 10 reps - Standing Shoulder Row with Anchored Resistance  - 1-2 x daily - 7 x weekly - 2 sets - 10 reps - 3 seconds hold - Standing Shoulder Internal Rotation Stretch with Towel  - 1-2 x daily - 7 x weekly - 10  reps - Standing Shoulder Extension with Dowel  - 1 x daily - 7 x weekly - 3 sets - 10 reps - Isometric Shoulder Flexion at Wall  - 1-2 x daily - 7 x weekly - 10 reps - 5 seconds hold - Isometric Shoulder Extension at Wall  - 1-2 x daily - 7 x weekly - 10 reps - 5 seconds hold - Isometric Shoulder External Rotation at Wall  - 1-2 x daily - 7 x weekly - 10 reps - 5 seconds hold - Seated Gentle Upper Trapezius Stretch  - 1-2 x daily - 7 x weekly - 3 reps - 10 seconds hold - Gentle Levator Scapulae Stretch  - 1-2 x daily - 7 x weekly - 3 reps - 10 seconds hold  ASSESSMENT:  CLINICAL IMPRESSION: Pt reporting he is enjoying his home exercises and they are going well. Pt with good tolerance to exercises this visit. Pt needed verbal and tactile cues to help prevent Rt upper trap compensation. Pt's HEP was updated this visit and app explained. Recommending continue treatment plan.   OBJECTIVE IMPAIRMENTS: decreased ROM, decreased strength, impaired UE functional use, and pain.   ACTIVITY LIMITATIONS: lifting, dressing, reach over head, and hygiene/grooming  PARTICIPATION LIMITATIONS: driving, community activity, occupation, and yard work  PERSONAL FACTORS: 3+ comorbidities: see PMH above are also affecting patient's functional outcome.   REHAB POTENTIAL: Good  CLINICAL DECISION MAKING: Stable/uncomplicated  EVALUATION COMPLEXITY: Low  GOALS: Goals reviewed with patient? Yes  SHORT TERM GOALS: (target date for Short term goals are 3 weeks 12/10/2023)  1.Patient will demonstrate independent use of home exercise program to maintain progress from in clinic treatments. Goal status:  On-going 12/24/23  LONG TERM GOALS: (target dates for all long term goals are 10 weeks  02/01/2024 )   1. Patient will demonstrate/report pain at worst less than or equal to 2/10 to facilitate minimal limitation in daily activity secondary to pain symptoms. Goal status: New   2. Patient will demonstrate  independent use of home exercise program to facilitate ability to maintain/progress functional gains from skilled physical therapy services. Goal status: New   3. Patient will demonstrate Patient specific functional scale avg > or = 6.6 to indicate reduced disability due to condition.  Goal status: New   4.  Patient will demonstrate Rt UE MMT >/= 5 pounds throughout to facilitate lifting, reaching, carrying at PLOF in daily activity.   Goal status: New   5.  Patient will demonstrate Rt GH joint AROM WFL s symptoms to facilitate usual overhead reaching, self care, dressing at PLOF.    Goal status: New   6. Pt will improve Rt shoulder ER to >/=  65 degrees to facilitate functional mobility tasks.  Goal status: New     PLAN:  PT FREQUENCY: 1x/week  PT DURATION: 10 weeks  PLANNED INTERVENTIONS: Can include 02853- PT Re-evaluation, 97110-Therapeutic exercises, 97530- Therapeutic activity, [id number]- Neuromuscular re-education, 97535- Self Care, 97140- Manual therapy, [phone]- Gait training, [phone]- Orthotic Fit/training, [id number]- Canalith repositioning, [id number]- Aquatic Therapy, [phone]- Electrical stimulation (unattended), [id number] Physical performance testing, 97016- Vasopneumatic device, [id number]- Ultrasound, [id number]- Traction (mechanical), [id number]- Ionotophoresis 4mg /ml Dexamethasone,  20560 - Needle insertion w/o injection 1 or 2 muscles, 20561 - Needle insertion w/o injection 3 or more muscles.   Patient/Family education, Balance training, Stair training, Taping, Dry Needling, Joint mobilization, Joint manipulation, Spinal manipulation, Spinal mobilization, Scar mobilization, Vestibular training, Visual/preceptual remediation/compensation, DME instructions, Cryotherapy, and Moist heat.  All performed as medically necessary.  All included unless contraindicated  PLAN FOR NEXT SESSION:, shoulder ROM, strengthening as tolerated.  SABRA [name], PT, MPT 12/24/23 4:44 PM   12/24/23  4:44  PM    Date of referral: 10/23/23 Referring provider: Jerri Kay HERO, MD  Referring diagnosis? M25.511,G89.29 (ICD-10-CM) - Chronic right shoulder pain Treatment diagnosis? (if different than referring diagnosis) M25.11, M62.81, R60.0  What was this (referring dx) caused by? Fall and Arthritis  Nature of Condition: Initial Onset (within last 3 months)   Laterality: Rt  Current Functional Measure Score: Patient Specific Functional Scale 4.6  Objective measurements identify impairments when they are compared to normal values, the uninvolved extremity, and prior level of function.  [x]  Yes  []  No  Objective assessment of functional ability: Moderate functional limitations in Rt UE   Briefly describe symptoms: Decreased ROM, strength and functional mobility with pain noted with movements in Rt UE  How did symptoms start: Pt reporting a couple of falls out of his bed during a dream where he fell on his Rt shoulder.   Average pain intensity:  Last 24 hours: 6/10  Past week: up to 8/10  How often does the pt experience symptoms? Frequently  How much have the symptoms interfered with usual daily activities? Moderately  How has condition changed since care began at this facility? NA - initial visit  In general, how is the patients overall health? Good   BACK  PAIN (STarT Back Screening Tool) No

## 2023-12-25 ENCOUNTER — Other Ambulatory Visit: Payer: Self-pay | Admitting: Emergency Medicine

## 2023-12-25 DIAGNOSIS — E039 Hypothyroidism, unspecified: Secondary | ICD-10-CM

## 2023-12-31 ENCOUNTER — Encounter: Payer: Self-pay | Admitting: Rehabilitative and Restorative Service Providers"

## 2023-12-31 ENCOUNTER — Ambulatory Visit: Admitting: Rehabilitative and Restorative Service Providers"

## 2023-12-31 DIAGNOSIS — R600 Localized edema: Secondary | ICD-10-CM

## 2023-12-31 DIAGNOSIS — M6281 Muscle weakness (generalized): Secondary | ICD-10-CM

## 2023-12-31 DIAGNOSIS — M25511 Pain in right shoulder: Secondary | ICD-10-CM

## 2023-12-31 NOTE — Therapy (Signed)
 OUTPATIENT PHYSICAL THERAPY TREATMENT   Patient Name: Philip Woodard MRN: [id number] DOB:1950/04/27, 73 y.o., male Today's Date: 12/31/2023  END OF SESSION:  PT End of Session - 12/31/23 1558     Visit Number 3    Number of Visits 10    Date for Recertification  02/01/24    Authorization Type [employer]/ Medicare $20 copay    Authorization Time Period 11/19/2023-01/28/2024    Authorization - Visit Number 3    Authorization - Number of Visits 10    Progress Note Due on Visit 10    PT Start Time 1558    PT Stop Time 1637    PT Time Calculation (min) 39 min    Activity Tolerance Patient tolerated treatment well            Past Medical History:  Diagnosis Date   Alkaline phosphatase raised 06/28/2011   Fluctuating - suspect due to sarcoidosis   Allergic rhinitis    Allergy     Arthritis    knees   Genital herpes    GERD (gastroesophageal reflux disease)    Hemorrhoids    Hyperglycemia    Hyperplastic lymph node 06/27/2011   Submental node excised 1/02; regrowth: re-excision 10/06   Hypertension    Hypothyroidism    Multinodular goiter    Rheumatoid arteritis (HCC)    Routine general medical examination at a health care facility    Sarcoidosis    Sleep apnea    wears cpap    Past Surgical History:  Procedure Laterality Date   COLONOSCOPY  5-23/2003   COLONOSCOPY  02/04/2018   TA   COLONOSCOPY WITH PROPOFOL  05/26/2021   Gwendlyn Buddy at [facility]   POLYPECTOMY     ROTATOR CUFF REPAIR Bilateral [id number]   THYROIDECTOMY  2002   Patient Active Problem List   Diagnosis Date Noted   Umbilical hernia without obstruction and without gangrene 09/17/2023   Anemia 03/19/2023   Bilateral carpal tunnel syndrome 08/21/2022   NCGS (non-celiac gluten sensitivity) 01/25/2022   Internal hemorrhoids 01/25/2022   Cervical radiculopathy 10/17/2021   Lower urinary tract symptoms 07/20/2021   Chronic idiopathic gout involving toe of left foot without tophus 04/27/2021   Stage 3a  chronic kidney disease (HCC) 03/31/2021   Erectile dysfunction due to arterial insufficiency 03/31/2021   NASH (nonalcoholic steatohepatitis) 02/22/2015   Arthritis of right lower extremity 07/20/2014   Hearing loss 02/19/2014   OSA on CPAP 05/02/2013   Hypothyroid 03/29/2012   Primary hypertension 11/24/2008   HYPOTHYROIDISM, POSTSURGICAL 07/07/2008   HYPERTROPHY PROSTATE W/UR OBST & OTH LUTS 07/07/2008   DISC DISEASE, LUMBAR 12/27/2007   Sarcoidosis 06/06/2007   GOITER, MULTINODULAR 06/06/2007   Hemorrhoids 06/06/2007   Asthma 06/06/2007    PCP: Purcell Emil Schanz, MD   REFERRING PROVIDER: Jerri Kay HERO, MD   REFERRING DIAG:  Diagnosis  M25.511,G89.29 (ICD-10-CM) - Chronic right shoulder pain    THERAPY DIAG:  Acute pain of right shoulder  Muscle weakness (generalized)  Localized edema  Rationale for Evaluation and Treatment: Rehabilitation  ONSET DATE: 6 months  SUBJECTIVE:  SUBJECTIVE STATEMENT: Pt indicated no pain upon arrival today.  Reported 3/10 for Rt shoulder at worst in last few days.  Reported no neck pain.  Feeling like some progress noted in exercises.   PERTINENT HISTORY: History of Rotator cuff surgeries bil. HTN, RA, see PM above  PAIN:  NPRS scale: 3/10 at worst last 3-4 days.  Pain location: Rt shoulder Pain description: achy, stiffness, sharp at times Aggravating factors: raising Rt arm Relieving factors: ice  PRECAUTIONS: None  WEIGHT BEARING RESTRICTIONS: No  FALLS:  Has patient fallen in last 6 months? Yes. 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  Widened AC joint  consistent with prior distal clavicle excision.  Degenerative spurring of  the acromion.  High riding humeral head.   PATIENT SURVEYS:  Patient-Specific Activity Scoring Scheme  0 represents "unable to perform." 10 represents "able to perform at prior level. 0 1 2 3 4 5 6 7 8 9  10 (Date and Score)   Activity Eval  11/19/23 12/31/2023   1. Lifting the Rt arm (>8.5 milk) 2  5   2. Lift arm easily (reaching) 6   6  3. Reaching behind me going through belt loops 6 8  4.    5.    Score 4.6 6.33   Total score = sum of the activity scores/number of activities Minimum detectable change (90%CI) for average score = 2 points Minimum detectable change (90%CI) for single activity score = 3 points  COGNITION: Eval Overall cognitive status: [facility]      UPPER EXTREMITY ROM:   ROM Right Eval 11/19/23 active Left Eval 11/19/23 active Right 12/31/2023 AROM in supine  Shoulder flexion 55 c pain 158 158  Shoulder extension 60 65   Shoulder abduction 80 c pain 156 155  Shoulder adduction     Shoulder internal rotation   55 AROM in 60 deg abduction  Shoulder external rotation 48 65 70 AROM in 60 deg abduction  Elbow flexion     Elbow extension     Wrist flexion     Wrist extension     Wrist ulnar deviation     Wrist radial deviation     Wrist pronation     Wrist supination     (Blank rows = not tested)  UPPER EXTREMITY  MMT:  MMT Right Eval 11/19/23 Left Eval 11/19/23  Shoulder flexion 4.2# 23.4#  Shoulder extension    Shoulder abduction 4.3# 15.4#  Shoulder adduction    Shoulder internal rotation    Shoulder external rotation 5.0# 13.3#  Middle trapezius    Lower trapezius    Elbow flexion    Elbow extension    Wrist flexion    Wrist extension    Wrist ulnar deviation    Wrist radial deviation    Wrist pronation    Wrist supination    Grip strength (lbs)    (Blank rows = not tested)  SHOULDER SPECIAL TESTS:  Eval: Impingement tests: Hawkins/Kennedy impingement test: positive                                                                                                                                                                                                     TODAY'S TREATMENT:                                                                              DATE:  12/31/2023 Therex:   Standing wall flexion bilateral UE slides 2-3 sec hold x 10  Supine Rt shoulder AROM ER in 80 deg abduction 5 sec stretch x 10, IR x 10  UBE fwd/back 3 mins each way lvl 3.0  Review of HEP c cues for updated handout.   Neuro Re-ed (scapular control, muscle activation) Tband rows c scapular retraction bilateral green band 2 x 15 Tband GH ext green band bilateral 2 x 15  Standing green band ER walk outs with arm at side with towel 5 sec hold x 10 bilaterally    TODAY'S TREATMENT:                                                                              DATE: 12/24/2023 TherEx Shoulder isometrics x 10 holding 10 sec (flexion, extension, ER and IR) Upper trap stretch: x 5 holding 10 sec Levator scapulae stretch x 5 holding 10 sec Supine ER, using bar 2  x 10  TherAct UBE: 3 minutes each direction Level 2  Rows: blue TB x 15 holding 3 sec Shoulder extension: x 15 holding 3 sec Standing shoulder flexion c 1 # bar  2 x 10  Standing shoulder abduction  2 x 10 c cues to     DATE:  11/19/23 Therex:    HEP instruction/performance c cues for techniques, handout provided.  Trial set performed of each for comprehension and symptom assessment.  See below for exercise list.  Self care:  Sleeping positions using pillows for support   PATIENT EDUCATION:  Education details: HEP, POC Person educated: Patient Education method: Explanation, Demonstration, Verbal cues, and Handouts Education comprehension: verbalized understanding, returned demonstration, and verbal cues required  HOME EXERCISE PROGRAM: Access Code: [email or web address] URL: [email or web address] Date: 12/31/2023 Prepared by: Ozell Silvan  Exercises - Supine Shoulder External Rotation in 45 Degrees Abduction AAROM with Dowel  - 1-2 x daily - 7 x weekly - 10 reps - Standing Shoulder Row with Anchored Resistance  - 1-2 x daily - 7 x weekly - 2 sets - 10 reps - 3 seconds hold - Standing Shoulder Internal Rotation Stretch with Towel  - 1-2 x daily - 7 x weekly - 10 reps - Standing Shoulder Extension with Dowel  - 1 x daily - 7 x weekly - 3 sets - 10 reps - Isometric Shoulder Flexion at Wall  - 1-2 x daily - 7 x weekly - 10 reps - 5 seconds hold - Isometric Shoulder Extension at Wall  - 1-2 x daily - 7 x weekly - 10 reps - 5 seconds hold - Seated Gentle Upper Trapezius Stretch  - 1-2 x daily - 7 x weekly - 3 reps - 10 seconds hold - Gentle Levator Scapulae Stretch  - 1-2 x daily - 7 x weekly - 3 reps - 10 seconds hold - Shoulder External Rotation Reactive Isometrics (Mirrored)  - 1-2 x daily - 7 x weekly - 1 sets - 10 reps - 5-15 hold - Supine Shoulder External Rotation Stretch (Mirrored)  - 1-2 x daily - 7 x weekly - 1 sets - 10 reps - 5 hold - Supine Shoulder Internal Rotation Stretch (Mirrored)  - 1-2 x daily - 7 x weekly - 1 sets - 10 reps - 5 hold  ASSESSMENT:  CLINICAL IMPRESSION: Improvements noted in reassessment of active range and PSFS.  Still may continue to benefit from strengthening and mobility  intervention to promote continued functional improvements.     OBJECTIVE IMPAIRMENTS: decreased ROM, decreased strength, impaired UE functional use, and pain.   ACTIVITY LIMITATIONS: lifting, dressing, reach over head, and hygiene/grooming  PARTICIPATION LIMITATIONS: driving, community activity, occupation, and yard work  PERSONAL FACTORS: 3+ comorbidities: see PMH above are also affecting patient's functional outcome.   REHAB POTENTIAL: Good  CLINICAL DECISION MAKING: Stable/uncomplicated  EVALUATION COMPLEXITY: Low   GOALS: Goals reviewed with patient? Yes  SHORT TERM GOALS: (target date for Short term goals are 3 weeks 12/10/2023)  1.Patient will demonstrate independent use of home exercise program to maintain progress from in clinic treatments. Goal status: Met  LONG TERM GOALS: (target dates for all long term goals are 10 weeks  02/01/2024 )   1. Patient will demonstrate/report pain at worst less than or equal to 2/10 to facilitate minimal limitation in daily activity secondary to pain symptoms. Goal status: on going 12/31/2023   2. Patient will demonstrate independent use of home exercise program to facilitate ability to maintain/progress functional gains from skilled physical therapy services. Goal status: on going 12/31/2023   3. Patient will demonstrate Patient specific functional scale avg > or = 6.6 to indicate reduced disability due to condition.  Goal status: on going 12/31/2023   4.  Patient will demonstrate Rt UE MMT >/= 5 pounds throughout to facilitate lifting, reaching, carrying at PLOF in daily activity.   Goal status: on going 12/31/2023   5.  Patient will demonstrate Rt GH joint AROM WFL s symptoms to facilitate usual overhead reaching, self care, dressing at PLOF.    Goal status: on going  12/31/2023   6. Pt will improve Rt shoulder ER to >/=  65 degrees to facilitate functional mobility tasks.  Goal status: Met 12/31/2023     PLAN:  PT  FREQUENCY: 1x/week  PT DURATION: 10 weeks  PLANNED INTERVENTIONS: Can include 02853- PT Re-evaluation, 97110-Therapeutic exercises, 97530- Therapeutic activity, 97112- Neuromuscular re-education, 97535- Self Care, 97140- Manual therapy, [phone]- Gait training, [phone]- Orthotic Fit/training, [phone]- Canalith repositioning, [id number]- Aquatic Therapy, [phone]- Electrical stimulation (unattended), [id number] Physical performance testing, 97016- Vasopneumatic device, [id number]- Ultrasound, [id number]- Traction (mechanical), [id number]- Ionotophoresis 4mg /ml Dexamethasone,  20560 - Needle insertion w/o injection 1 or 2 muscles, 20561 - Needle insertion w/o injection 3 or more muscles.   Patient/Family education, Balance training, Stair training, Taping, Dry Needling, Joint mobilization, Joint manipulation, Spinal manipulation, Spinal mobilization, Scar mobilization, Vestibular training, Visual/preceptual remediation/compensation, DME instructions, Cryotherapy, and Moist heat.  All performed as medically necessary.  All included unless contraindicated  PLAN FOR NEXT SESSION:, Functional strengthening, rotation range gains.    Ozell Silvan, PT, DPT, OCS, ATC 12/31/23  4:39 PM       Date of referral: 10/23/23 Referring provider: Jerri Kay HERO, MD  Referring diagnosis? M25.511,G89.29 (ICD-10-CM) - Chronic right shoulder pain Treatment diagnosis? (if different than referring diagnosis) M25.11, M62.81, R60.0  What was this (referring dx) caused by? Fall and Arthritis  Nature of Condition: Initial Onset (within last 3 months)   Laterality: Rt  Current Functional Measure Score: Patient Specific Functional Scale 4.6  Objective measurements identify impairments when they are compared to normal values, the uninvolved extremity, and prior level of function.  [x]  Yes  []  No  Objective assessment of functional ability: Moderate functional limitations in Rt UE   Briefly describe symptoms: Decreased ROM, strength and  functional mobility with pain noted with movements in Rt UE  How did symptoms start: Pt reporting a couple of falls out of his bed during a dream where he fell on his Rt shoulder.   Average pain intensity:  Last 24 hours: 6/10  Past week: up to 8/10  How often does the pt experience symptoms? Frequently  How much have the symptoms interfered with usual daily activities? Moderately  How has condition changed since care began at this facility? NA - initial visit  In general, how is the patients overall health? Good   BACK PAIN (STarT Back Screening Tool) No

## 2024-01-07 ENCOUNTER — Encounter: Admitting: Physical Therapy

## 2024-01-07 ENCOUNTER — Telehealth: Payer: Self-pay | Admitting: Physical Therapy

## 2024-01-07 NOTE — Telephone Encounter (Signed)
 I followed up after pt missed his 4:00 PT appointment today (01/07/24). I left our clinic number [phone]) if pt wished to rescheduled his missed visit. I also reminded pt of his upcoming appointment on 01/15/24 at 2:30.  [name], PT, MPT 01/07/24 4:19 PM

## 2024-01-07 NOTE — Therapy (Deleted)
 OUTPATIENT PHYSICAL THERAPY TREATMENT   Patient Name: Philip Woodard MRN: [id number] DOB:[birth year], 73 y.o., male Today's Date: 01/07/2024  END OF SESSION:      Past Medical History:  Diagnosis Date   Alkaline phosphatase raised 06/28/2011   Fluctuating - suspect due to sarcoidosis   Allergic rhinitis    Allergy     Arthritis    knees   Genital herpes    GERD (gastroesophageal reflux disease)    Hemorrhoids    Hyperglycemia    Hyperplastic lymph node 06/27/2011   Submental node excised 1/02; regrowth: re-excision 10/06   Hypertension    Hypothyroidism    Multinodular goiter    Rheumatoid arteritis (HCC)    Routine general medical examination at a health care facility    Sarcoidosis    Sleep apnea    wears cpap    Past Surgical History:  Procedure Laterality Date   COLONOSCOPY  5-23/2003   COLONOSCOPY  02/04/2018   TA   COLONOSCOPY WITH PROPOFOL  05/26/2021   Gwendlyn Buddy at [facility]   POLYPECTOMY     ROTATOR CUFF REPAIR Bilateral [id number]   THYROIDECTOMY  2002   Patient Active Problem List   Diagnosis Date Noted   Umbilical hernia without obstruction and without gangrene 09/17/2023   Anemia 03/19/2023   Bilateral carpal tunnel syndrome 08/21/2022   NCGS (non-celiac gluten sensitivity) 01/25/2022   Internal hemorrhoids 01/25/2022   Cervical radiculopathy 10/17/2021   Lower urinary tract symptoms 07/20/2021   Chronic idiopathic gout involving toe of left foot without tophus 04/27/2021   Stage 3a chronic kidney disease (HCC) 03/31/2021   Erectile dysfunction due to arterial insufficiency 03/31/2021   NASH (nonalcoholic steatohepatitis) 02/22/2015   Arthritis of right lower extremity 07/20/2014   Hearing loss 02/19/2014   OSA on CPAP 05/02/2013   Hypothyroid 03/29/2012   Primary hypertension 11/24/2008   HYPOTHYROIDISM, POSTSURGICAL 07/07/2008   HYPERTROPHY PROSTATE W/UR OBST & OTH LUTS 07/07/2008   DISC DISEASE, LUMBAR 12/27/2007   Sarcoidosis  06/06/2007   GOITER, MULTINODULAR 06/06/2007   Hemorrhoids 06/06/2007   Asthma 06/06/2007    PCP: Purcell Emil Schanz, MD   REFERRING PROVIDER: Jerri Kay HERO, MD   REFERRING DIAG:  Diagnosis  M25.511,G89.29 (ICD-10-CM) - Chronic right shoulder pain    THERAPY DIAG:  No diagnosis found.  Rationale for Evaluation and Treatment: Rehabilitation  ONSET DATE: 6 months  SUBJECTIVE:  SUBJECTIVE STATEMENT: Pt indicated no pain upon arrival today.  Reported 3/10 for Rt shoulder at worst in last few days.  Reported no neck pain.  Feeling like some progress noted in exercises.   PERTINENT HISTORY: History of Rotator cuff surgeries bil. HTN, RA, see PM above  PAIN:  NPRS scale: 3/10 at worst last 3-4 days.  Pain location: Rt shoulder Pain description: achy, stiffness, sharp at times Aggravating factors: raising Rt arm Relieving factors: ice  PRECAUTIONS: None  WEIGHT BEARING RESTRICTIONS: No  FALLS:  Has patient fallen in last 6 months? Yes. 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  Widened AC joint  consistent with prior distal clavicle excision.  Degenerative spurring of  the acromion.  High riding humeral head.   PATIENT SURVEYS:  Patient-Specific Activity Scoring Scheme  0 represents "unable to perform." 10 represents "able to perform at prior level. 0 1 2 3 4 5 6 7 8 9  10 (Date and Score)   Activity Eval  11/19/23 12/31/2023   1. Lifting the Rt arm (>8.5 milk) 2  5   2. Lift arm easily (reaching) 6   6  3. Reaching behind me going through belt loops 6 8  4.    5.    Score 4.6 6.33   Total score = sum of the activity scores/number of activities Minimum detectable change (90%CI) for average score = 2 points Minimum detectable change (90%CI) for single activity score = 3 points  COGNITION: Eval Overall cognitive status: [facility]      UPPER EXTREMITY ROM:   ROM Right Eval 11/19/23 active Left Eval 11/19/23 active Right 12/31/2023 AROM in supine  Shoulder flexion 55 c pain 158 158  Shoulder extension 60 65   Shoulder abduction 80 c pain 156 155  Shoulder adduction     Shoulder internal rotation   55 AROM in 60 deg abduction  Shoulder external rotation 48 65 70 AROM in 60 deg abduction  Elbow flexion     Elbow extension     Wrist flexion     Wrist extension     Wrist ulnar deviation     Wrist radial deviation     Wrist pronation     Wrist supination     (Blank rows = not tested)  UPPER EXTREMITY MMT:  MMT Right Eval 11/19/23 Left Eval 11/19/23  Shoulder flexion 4.2# 23.4#  Shoulder extension    Shoulder abduction 4.3# 15.4#  Shoulder adduction    Shoulder internal rotation    Shoulder external rotation 5.0# 13.3#  Middle trapezius    Lower trapezius    Elbow flexion    Elbow extension    Wrist flexion    Wrist extension    Wrist ulnar deviation    Wrist radial deviation    Wrist pronation    Wrist supination    Grip strength (lbs)    (Blank rows = not tested)  SHOULDER SPECIAL  TESTS:  Eval: Impingement tests: Hawkins/Kennedy impingement test: positive                                                                                                                                                                                                     TODAY'S TREATMENT:                                                                              DATE:  12/31/2023 Therex:   Standing wall flexion bilateral UE slides 2-3 sec hold x 10  Supine Rt shoulder AROM ER in 80 deg abduction 5 sec stretch x 10, IR x 10  UBE fwd/back 3 mins each way lvl 3.0  Review of HEP c cues for updated handout.   Neuro Re-ed (scapular control, muscle activation) Tband rows c scapular retraction bilateral green band 2 x 15 Tband GH ext green band bilateral 2 x 15  Standing green band ER walk outs with arm at side with towel 5 sec hold x 10 bilaterally    TODAY'S TREATMENT:                                                                              DATE: 12/24/2023 TherEx Shoulder isometrics x 10 holding 10 sec (flexion, extension, ER and IR) Upper trap stretch: x 5 holding 10 sec Levator scapulae stretch x 5 holding 10 sec Supine ER, using bar 2  x 10  TherAct UBE: 3 minutes each direction Level 2  Rows: blue TB x 15 holding 3 sec Shoulder extension: x 15 holding 3 sec Standing shoulder flexion c 1 # bar  2 x 10  Standing shoulder abduction  2 x 10 c cues to     DATE: 11/19/23 Therex:    HEP instruction/performance c cues for techniques, handout provided.  Trial set performed of each for comprehension and symptom assessment.  See below for exercise list.  Self care:  Sleeping positions using pillows for support   PATIENT EDUCATION: Education  details: HEP, POC Person educated: Patient Education method: Explanation, Demonstration, Verbal cues, and Handouts Education comprehension: verbalized understanding, returned demonstration, and verbal cues required  HOME EXERCISE  PROGRAM: Access Code: [email or web address] URL: [email or web address] Date: 12/31/2023 Prepared by: Ozell Silvan  Exercises - Supine Shoulder External Rotation in 45 Degrees Abduction AAROM with Dowel  - 1-2 x daily - 7 x weekly - 10 reps - Standing Shoulder Row with Anchored Resistance  - 1-2 x daily - 7 x weekly - 2 sets - 10 reps - 3 seconds hold - Standing Shoulder Internal Rotation Stretch with Towel  - 1-2 x daily - 7 x weekly - 10 reps - Standing Shoulder Extension with Dowel  - 1 x daily - 7 x weekly - 3 sets - 10 reps - Isometric Shoulder Flexion at Wall  - 1-2 x daily - 7 x weekly - 10 reps - 5 seconds hold - Isometric Shoulder Extension at Wall  - 1-2 x daily - 7 x weekly - 10 reps - 5 seconds hold - Seated Gentle Upper Trapezius Stretch  - 1-2 x daily - 7 x weekly - 3 reps - 10 seconds hold - Gentle Levator Scapulae Stretch  - 1-2 x daily - 7 x weekly - 3 reps - 10 seconds hold - Shoulder External Rotation Reactive Isometrics (Mirrored)  - 1-2 x daily - 7 x weekly - 1 sets - 10 reps - 5-15 hold - Supine Shoulder External Rotation Stretch (Mirrored)  - 1-2 x daily - 7 x weekly - 1 sets - 10 reps - 5 hold - Supine Shoulder Internal Rotation Stretch (Mirrored)  - 1-2 x daily - 7 x weekly - 1 sets - 10 reps - 5 hold  ASSESSMENT:  CLINICAL IMPRESSION:     OBJECTIVE IMPAIRMENTS: decreased ROM, decreased strength, impaired UE functional use, and pain.   ACTIVITY LIMITATIONS: lifting, dressing, reach over head, and hygiene/grooming  PARTICIPATION LIMITATIONS: driving, community activity, occupation, and yard work  PERSONAL FACTORS: 3+ comorbidities: see PMH above are also affecting patient's functional outcome.   REHAB POTENTIAL: Good  CLINICAL DECISION MAKING: Stable/uncomplicated  EVALUATION COMPLEXITY: Low   GOALS: Goals reviewed with patient? Yes  SHORT TERM GOALS: (target date for Short term goals are 3 weeks 12/10/2023)  1.Patient will demonstrate  independent use of home exercise program to maintain progress from in clinic treatments. Goal status: Met  LONG TERM GOALS: (target dates for all long term goals are 10 weeks  02/01/2024 )   1. Patient will demonstrate/report pain at worst less than or equal to 2/10 to facilitate minimal limitation in daily activity secondary to pain symptoms. Goal status: on going 12/31/2023   2. Patient will demonstrate independent use of home exercise program to facilitate ability to maintain/progress functional gains from skilled physical therapy services. Goal status: on going 12/31/2023   3. Patient will demonstrate Patient specific functional scale avg > or = 6.6 to indicate reduced disability due to condition.  Goal status: on going 12/31/2023   4.  Patient will demonstrate Rt UE MMT >/= 5 pounds throughout to facilitate lifting, reaching, carrying at PLOF in daily activity.   Goal status: on going 12/31/2023   5.  Patient will demonstrate Rt GH joint AROM WFL s symptoms to facilitate usual overhead reaching, self care, dressing at PLOF.    Goal status: on going 12/31/2023   6. Pt will improve Rt shoulder ER to >/=  65 degrees to facilitate functional mobility tasks.  Goal status: Met 12/31/2023  PLAN:  PT FREQUENCY: 1x/week  PT DURATION: 10 weeks  PLANNED INTERVENTIONS: Can include 02853- PT Re-evaluation, 97110-Therapeutic exercises, 97530- Therapeutic activity, [id number]- Neuromuscular re-education, 97535- Self Care, 97140- Manual therapy, [id number]- Gait training, [phone]- Orthotic Fit/training, [phone]- Canalith repositioning, [id number]- Aquatic Therapy, [phone]- Electrical stimulation (unattended), [id number] Physical performance testing, 97016- Vasopneumatic device, [id number]- Ultrasound, [id number]- Traction (mechanical), [id number]- Ionotophoresis 4mg /ml Dexamethasone,  20560 - Needle insertion w/o injection 1 or 2 muscles, 20561 - Needle insertion w/o injection 3 or more muscles.   Patient/Family education,  Balance training, Stair training, Taping, Dry Needling, Joint mobilization, Joint manipulation, Spinal manipulation, Spinal mobilization, Scar mobilization, Vestibular training, Visual/preceptual remediation/compensation, DME instructions, Cryotherapy, and Moist heat.  All performed as medically necessary.  All included unless contraindicated  PLAN FOR NEXT SESSION:, Functional strengthening, rotation range gains.    [name], PT, MPT 01/07/24 3:08 PM   01/07/24  3:08 PM       Date of referral: 10/23/23 Referring provider: Jerri Kay HERO, MD  Referring diagnosis? M25.511,G89.29 (ICD-10-CM) - Chronic right shoulder pain Treatment diagnosis? (if different than referring diagnosis) M25.11, M62.81, R60.0  What was this (referring dx) caused by? Fall and Arthritis  Nature of Condition: Initial Onset (within last 3 months)   Laterality: Rt  Current Functional Measure Score: Patient Specific Functional Scale 4.6  Objective measurements identify impairments when they are compared to normal values, the uninvolved extremity, and prior level of function.  [x]  Yes  []  No  Objective assessment of functional ability: Moderate functional limitations in Rt UE   Briefly describe symptoms: Decreased ROM, strength and functional mobility with pain noted with movements in Rt UE  How did symptoms start: Pt reporting a couple of falls out of his bed during a dream where he fell on his Rt shoulder.   Average pain intensity:  Last 24 hours: 6/10  Past week: up to 8/10  How often does the pt experience symptoms? Frequently  How much have the symptoms interfered with usual daily activities? Moderately  How has condition changed since care began at this facility? NA - initial visit  In general, how is the patients overall health? Good   BACK PAIN (STarT Back Screening Tool) No

## 2024-01-14 ENCOUNTER — Encounter: Payer: Self-pay | Admitting: Radiology

## 2024-01-15 ENCOUNTER — Ambulatory Visit: Admitting: Physical Therapy

## 2024-01-15 ENCOUNTER — Encounter: Payer: Self-pay | Admitting: Physical Therapy

## 2024-01-15 DIAGNOSIS — M542 Cervicalgia: Secondary | ICD-10-CM

## 2024-01-15 DIAGNOSIS — M25511 Pain in right shoulder: Secondary | ICD-10-CM

## 2024-01-15 DIAGNOSIS — R293 Abnormal posture: Secondary | ICD-10-CM

## 2024-01-15 DIAGNOSIS — R600 Localized edema: Secondary | ICD-10-CM | POA: Diagnosis not present

## 2024-01-15 DIAGNOSIS — M6281 Muscle weakness (generalized): Secondary | ICD-10-CM

## 2024-01-15 NOTE — Therapy (Signed)
 OUTPATIENT PHYSICAL THERAPY TREATMENT   Patient Name: Philip Woodard MRN: [id number] DOB:1951/02/06, 73 y.o., male Today's Date: 01/15/2024  END OF SESSION:  PT End of Session - 01/15/24 1434     Visit Number 4    Number of Visits 10    Date for Recertification  02/01/24    Authorization Type UHC/ Medicare $20 copay    Authorization Time Period 11/19/2023-01/28/2024    Authorization - Number of Visits 10    Progress Note Due on Visit 10    PT Start Time 1432    PT Stop Time 1510    PT Time Calculation (min) 38 min    Activity Tolerance Patient tolerated treatment well    Behavior During Therapy [facility] for tasks assessed/performed            Past Medical History:  Diagnosis Date   Alkaline phosphatase raised 06/28/2011   Fluctuating - suspect due to sarcoidosis   Allergic rhinitis    Allergy     Arthritis    knees   Genital herpes    GERD (gastroesophageal reflux disease)    Hemorrhoids    Hyperglycemia    Hyperplastic lymph node 06/27/2011   Submental node excised 1/02; regrowth: re-excision 10/06   Hypertension    Hypothyroidism    Multinodular goiter    Rheumatoid arteritis (HCC)    Routine general medical examination at a health care facility    Sarcoidosis    Sleep apnea    wears cpap    Past Surgical History:  Procedure Laterality Date   COLONOSCOPY  5-23/2003   COLONOSCOPY  02/04/2018   TA   COLONOSCOPY WITH PROPOFOL  05/26/2021   Gwendlyn Buddy at [facility]   POLYPECTOMY     ROTATOR CUFF REPAIR Bilateral [id number]   THYROIDECTOMY  2002   Patient Active Problem List   Diagnosis Date Noted   Umbilical hernia without obstruction and without gangrene 09/17/2023   Anemia 03/19/2023   Bilateral carpal tunnel syndrome 08/21/2022   NCGS (non-celiac gluten sensitivity) 01/25/2022   Internal hemorrhoids 01/25/2022   Cervical radiculopathy 10/17/2021   Lower urinary tract symptoms 07/20/2021   Chronic idiopathic gout involving toe of left foot without tophus  04/27/2021   Stage 3a chronic kidney disease (HCC) 03/31/2021   Erectile dysfunction due to arterial insufficiency 03/31/2021   NASH (nonalcoholic steatohepatitis) 02/22/2015   Arthritis of right lower extremity 07/20/2014   Hearing loss 02/19/2014   OSA on CPAP 05/02/2013   Hypothyroid 03/29/2012   Primary hypertension 11/24/2008   HYPOTHYROIDISM, POSTSURGICAL 07/07/2008   HYPERTROPHY PROSTATE W/UR OBST & OTH LUTS 07/07/2008   DISC DISEASE, LUMBAR 12/27/2007   Sarcoidosis 06/06/2007   GOITER, MULTINODULAR 06/06/2007   Hemorrhoids 06/06/2007   Asthma 06/06/2007    PCP: Purcell Emil Schanz, MD   REFERRING PROVIDER: Jerri Kay HERO, MD   REFERRING DIAG:  Diagnosis  M25.511,G89.29 (ICD-10-CM) - Chronic right shoulder pain    THERAPY DIAG:  Acute pain of right shoulder  Muscle weakness (generalized)  Localized edema  Cervicalgia  Abnormal posture  Rationale for Evaluation and Treatment: Rehabilitation  ONSET DATE: 6 months  SUBJECTIVE:  SUBJECTIVE STATEMENT: Pt arriving today reporting no pain.   PERTINENT HISTORY: History of Rotator cuff surgeries bil. HTN, RA, see PM above  PAIN:  NPRS scale: no pain today Pain location: Rt shoulder Pain description: achy, stiffness, sharp at times Aggravating factors: raising Rt arm Relieving factors: ice  PRECAUTIONS: None  WEIGHT BEARING RESTRICTIONS: No  FALLS:  Has patient fallen in last 6 months? Yes. 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  Widened AC joint  consistent with prior distal clavicle excision.  Degenerative spurring of  the acromion.  High riding humeral head.   PATIENT SURVEYS:  Patient-Specific Activity Scoring Scheme  0 represents "unable to perform." 10 represents "able to perform at prior level. 0 1 2 3 4 5 6 7 8 9  10 (Date and Score)   Activity Eval  11/19/23 12/31/2023   1. Lifting the Rt arm (>8.5 milk) 2  5   2. Lift arm easily (reaching) 6   6  3. Reaching behind me going through belt loops 6 8  4.    5.    Score 4.6 6.33   Total score = sum of the activity scores/number of activities Minimum detectable change (90%CI) for average score = 2 points Minimum detectable change (90%CI) for single activity score = 3 points  COGNITION: Eval Overall cognitive status: [facility]      UPPER EXTREMITY ROM:   ROM Right Eval 11/19/23 active Left Eval 11/19/23 active Right 12/31/2023 AROM in supine Rt 01/15/24 AROM supine  Shoulder flexion 55 c pain 158 158 160  Shoulder extension 60 65    Shoulder abduction 80 c pain 156 155 160  Shoulder adduction      Shoulder internal rotation   55 AROM in 60 deg abduction 55 AROM in 60 deg abduction  Shoulder external rotation 48 65 70 AROM in 60 deg abduction 68 AROM in 60 deg abduction,  70 deg passively  Elbow flexion      Elbow extension      Wrist flexion      Wrist extension      Wrist ulnar deviation      Wrist radial deviation      Wrist pronation      Wrist supination      (Blank rows =  not tested)  UPPER EXTREMITY MMT:  MMT Right Eval 11/19/23 Left Eval 11/19/23 Rt 01/15/24  Shoulder flexion 4.2# 23.4# 7.4#  Shoulder extension     Shoulder abduction 4.3# 15.4# 6.4#  Shoulder adduction     Shoulder internal rotation     Shoulder external rotation 5.0# 13.3# 8.6#  Middle trapezius     Lower trapezius     Elbow flexion     Elbow extension     Wrist flexion     Wrist extension     Wrist ulnar deviation     Wrist radial deviation     Wrist pronation     Wrist  supination     Grip strength (lbs)     (Blank rows = not tested)  SHOULDER SPECIAL TESTS: Eval: Impingement tests: Hawkins/Kennedy impingement test: positive                                                                                                                                                                                                   TODAY'S TREATMENT:                                                                              DATE: 01/15/2024 TherEx Shoulder flexion wall slides bil using towel x 10,  Shoulder scaption wall slides with towel x 10  Shoulder stability shoulder circles with ball on the wall x 15 each direction AA shoulder abd c 1 # bar x 10  IR c green TB  2 x 10  MMT and ROM updated TherAct UBE: 3 minutes each direction Level 2  Rows: blue TB x 15 holding 3 sec Shoulder extension: x 15 holding 3 sec ER walk outs: green TB c towel under elbow 2 x 10 Reaching 1# onto clinic 1st shelf x 10 and then 2nd shelf    TODAY'S TREATMENT:                                                                              DATE:  12/31/2023 Therex:   Standing wall flexion bilateral UE slides 2-3 sec hold x 10  Supine Rt shoulder AROM ER in 80 deg abduction 5 sec stretch x 10, IR x 10  UBE fwd/back 3 mins each way lvl 3.0  Review of HEP c cues for updated handout.   Neuro Re-ed (scapular control, muscle activation) Tband rows c scapular retraction bilateral green band 2 x 15 Tband  GH ext green band bilateral 2 x 15  Standing green band ER walk outs with arm at side with towel 5 sec hold x 10 bilaterally    TODAY'S TREATMENT:  DATE: 12/24/2023 TherEx Shoulder isometrics x 10 holding 10 sec (flexion, extension, ER and IR) Upper trap stretch: x 5 holding 10 sec Levator scapulae stretch x 5 holding 10 sec Supine ER, using bar 2  x 10  TherAct UBE: 3 minutes each direction Level 2  Rows: blue TB x 15 holding 3 sec Shoulder extension: x 15 holding 3 sec Standing shoulder flexion c 1 # bar  2 x 10  Standing shoulder abduction  2 x 10 c cues to      PATIENT EDUCATION: Education details: HEP, POC Person educated: Patient Education method: Programmer, Multimedia, Facilities Manager, Verbal cues, and Handouts Education comprehension: verbalized understanding, returned demonstration, and verbal cues required  HOME EXERCISE PROGRAM: Access Code: [email or web address] URL: [email or web address] Date: 12/31/2023 Prepared by: Ozell Silvan  Exercises - Supine Shoulder External Rotation in 45 Degrees Abduction AAROM with Dowel  - 1-2 x daily - 7 x weekly - 10 reps - Standing Shoulder Row with Anchored Resistance  - 1-2 x daily - 7 x weekly - 2 sets - 10 reps - 3 seconds hold - Standing Shoulder Internal Rotation Stretch with Towel  - 1-2 x daily - 7 x weekly - 10 reps - Standing Shoulder Extension with Dowel  - 1 x daily - 7 x weekly - 3 sets - 10 reps - Isometric Shoulder Flexion at Wall  - 1-2 x daily - 7 x weekly - 10 reps - 5 seconds hold - Isometric Shoulder Extension at Wall  - 1-2 x daily - 7 x weekly - 10 reps - 5 seconds hold - Seated Gentle Upper Trapezius Stretch  - 1-2 x daily - 7 x weekly - 3 reps - 10 seconds hold - Gentle Levator Scapulae Stretch  - 1-2 x daily - 7 x weekly - 3 reps - 10 seconds hold - Shoulder External Rotation Reactive Isometrics (Mirrored)  - 1-2 x daily - 7 x weekly - 1  sets - 10 reps - 5-15 hold - Supine Shoulder External Rotation Stretch (Mirrored)  - 1-2 x daily - 7 x weekly - 1 sets - 10 reps - 5 hold - Supine Shoulder Internal Rotation Stretch (Mirrored)  - 1-2 x daily - 7 x weekly - 1 sets - 10 reps - 5 hold  ASSESSMENT:  CLINICAL IMPRESSION: Pt has made progress since beginning PT with strength however still lacking functional strength in his Rt shoulder. Pt having difficulty with gravity induced reaching to greater than 90 degrees.  Pt tolerating all exercises well and is currently independent in his HEP. Recommending continued skilled PT to maximize pt's function.     OBJECTIVE IMPAIRMENTS: decreased ROM, decreased strength, impaired UE functional use, and pain.   ACTIVITY LIMITATIONS: lifting, dressing, reach over head, and hygiene/grooming  PARTICIPATION LIMITATIONS: driving, community activity, occupation, and yard work  PERSONAL FACTORS: 3+ comorbidities: see PMH above are also affecting patient's functional outcome.   REHAB POTENTIAL: Good  CLINICAL DECISION MAKING: Stable/uncomplicated  EVALUATION COMPLEXITY: Low   GOALS: Goals reviewed with patient? Yes  SHORT TERM GOALS: (target date for Short term goals are 3 weeks 12/10/2023)  1.Patient will demonstrate independent use of home exercise program to maintain progress from in clinic treatments. Goal status: Met  LONG TERM GOALS: (target dates for all long term goals are 10 weeks  02/01/2024 )   1. Patient will demonstrate/report pain at worst less than or equal to 2/10 to facilitate minimal limitation in daily activity secondary to pain symptoms. Goal status: on going 12/31/2023  2. Patient will demonstrate independent use of home exercise program to facilitate ability to maintain/progress functional gains from skilled physical therapy services. Goal status: on going 12/31/2023   3. Patient will demonstrate Patient specific functional scale avg > or = 6.6 to indicate reduced  disability due to condition.  Goal status: on going 12/31/2023   4.  Patient will demonstrate Rt UE MMT >/= 5 pounds throughout to facilitate lifting, reaching, carrying at PLOF in daily activity.   Goal status: on going 12/31/2023   5.  Patient will demonstrate Rt GH joint AROM WFL s symptoms to facilitate usual overhead reaching, self care, dressing at PLOF.    Goal status: on going 12/31/2023   6. Pt will improve Rt shoulder ER to >/=  65 degrees to facilitate functional mobility tasks.  Goal status: Met 12/31/2023     PLAN:  PT FREQUENCY: 1x/week  PT DURATION: 10 weeks  PLANNED INTERVENTIONS: Can include 02853- PT Re-evaluation, 97110-Therapeutic exercises, 97530- Therapeutic activity, 97112- Neuromuscular re-education, 97535- Self Care, 97140- Manual therapy, [phone]- Gait training, [phone]- Orthotic Fit/training, [phone]- Canalith repositioning, [id number]- Aquatic Therapy, [phone]- Electrical stimulation (unattended), [id number] Physical performance testing, 97016- Vasopneumatic device, [id number]- Ultrasound, [id number]- Traction (mechanical), [id number]- Ionotophoresis 4mg /ml Dexamethasone,  20560 - Needle insertion w/o injection 1 or 2 muscles, 20561 - Needle insertion w/o injection 3 or more muscles.   Patient/Family education, Balance training, Stair training, Taping, Dry Needling, Joint mobilization, Joint manipulation, Spinal manipulation, Spinal mobilization, Scar mobilization, Vestibular training, Visual/preceptual remediation/compensation, DME instructions, Cryotherapy, and Moist heat.  All performed as medically necessary.  All included unless contraindicated  PLAN FOR NEXT SESSION:, Functional strengthening, IR and ER strengthening and ROM as tolerated   [name], PT, MPT 01/15/24 3:12 PM   01/15/24  3:12 PM       Date of referral: 10/23/23 Referring provider: Jerri Kay HERO, MD  Referring diagnosis? M25.511,G89.29 (ICD-10-CM) - Chronic right shoulder pain Treatment diagnosis? (if  different than referring diagnosis) M25.11, M62.81, R60.0  What was this (referring dx) caused by? Fall and Arthritis  Nature of Condition: Initial Onset (within last 3 months)   Laterality: Rt  Current Functional Measure Score: Patient Specific Functional Scale 4.6  Objective measurements identify impairments when they are compared to normal values, the uninvolved extremity, and prior level of function.  [x]  Yes  []  No  Objective assessment of functional ability: Moderate functional limitations in Rt UE   Briefly describe symptoms: Decreased ROM, strength and functional mobility with pain noted with movements in Rt UE  How did symptoms start: Pt reporting a couple of falls out of his bed during a dream where he fell on his Rt shoulder.   Average pain intensity:  Last 24 hours: 6/10  Past week: up to 8/10  How often does the pt experience symptoms? Frequently  How much have the symptoms interfered with usual daily activities? Moderately  How has condition changed since care began at this facility? NA - initial visit  In general, how is the patients overall health? Good   BACK PAIN (STarT Back Screening Tool) No

## 2024-01-18 NOTE — Therapy (Signed)
 OUTPATIENT PHYSICAL THERAPY TREATMENT   Patient Name: Philip Woodard MRN: [id number] DOB:[birth year], 73 y.o., male Today's Date: 01/21/2024  END OF SESSION:  PT End of Session - 01/21/24 1306     Visit Number 5    Number of Visits 10    Date for Recertification  02/01/24    Authorization Type [employer]/ Medicare $20 copay    Authorization Time Period 11/19/2023-01/28/2024    Authorization - Number of Visits 10    Progress Note Due on Visit 10    PT Start Time 1306    PT Stop Time 1345    PT Time Calculation (min) 39 min    Activity Tolerance Patient tolerated treatment well    Behavior During Therapy [facility] for tasks assessed/performed             Past Medical History:  Diagnosis Date   Alkaline phosphatase raised 06/28/2011   Fluctuating - suspect due to sarcoidosis   Allergic rhinitis    Allergy     Arthritis    knees   Genital herpes    GERD (gastroesophageal reflux disease)    Hemorrhoids    Hyperglycemia    Hyperplastic lymph node 06/27/2011   Submental node excised 1/02; regrowth: re-excision 10/06   Hypertension    Hypothyroidism    Multinodular goiter    Rheumatoid arteritis (HCC)    Routine general medical examination at a health care facility    Sarcoidosis    Sleep apnea    wears cpap    Past Surgical History:  Procedure Laterality Date   COLONOSCOPY  5-23/2003   COLONOSCOPY  02/04/2018   TA   COLONOSCOPY WITH PROPOFOL  05/26/2021   Gwendlyn Buddy at [facility]   POLYPECTOMY     ROTATOR CUFF REPAIR Bilateral [id number]   THYROIDECTOMY  2002   Patient Active Problem List   Diagnosis Date Noted   Umbilical hernia without obstruction and without gangrene 09/17/2023   Anemia 03/19/2023   Bilateral carpal tunnel syndrome 08/21/2022   NCGS (non-celiac gluten sensitivity) 01/25/2022   Internal hemorrhoids 01/25/2022   Cervical radiculopathy 10/17/2021   Lower urinary tract symptoms 07/20/2021   Chronic idiopathic gout involving toe of left foot without tophus  04/27/2021   Stage 3a chronic kidney disease (HCC) 03/31/2021   Erectile dysfunction due to arterial insufficiency 03/31/2021   NASH (nonalcoholic steatohepatitis) 02/22/2015   Arthritis of right lower extremity 07/20/2014   Hearing loss 02/19/2014   OSA on CPAP 05/02/2013   Hypothyroid 03/29/2012   Primary hypertension 11/24/2008   HYPOTHYROIDISM, POSTSURGICAL 07/07/2008   HYPERTROPHY PROSTATE W/UR OBST & OTH LUTS 07/07/2008   DISC DISEASE, LUMBAR 12/27/2007   Sarcoidosis 06/06/2007   GOITER, MULTINODULAR 06/06/2007   Hemorrhoids 06/06/2007   Asthma 06/06/2007    PCP: Purcell Emil Schanz, MD   REFERRING PROVIDER: Jerri Kay HERO, MD   REFERRING DIAG:  Diagnosis  M25.511,G89.29 (ICD-10-CM) - Chronic right shoulder pain    THERAPY DIAG:  Acute pain of right shoulder  Muscle weakness (generalized)  Localized edema  Cervicalgia  Abnormal posture  Rationale for Evaluation and Treatment: Rehabilitation  ONSET DATE: 6 months  SUBJECTIVE:  SUBJECTIVE STATEMENT: Patient reporting continuation of no pain in shoulder.   PERTINENT HISTORY: History of Rotator cuff surgeries bil. HTN, RA, see PM above  PAIN:  NPRS scale: no pain today Pain location: Rt shoulder Pain description: achy, stiffness, sharp at times Aggravating factors: raising Rt arm Relieving factors: ice  PRECAUTIONS: None  WEIGHT BEARING RESTRICTIONS: No  FALLS:  Has patient fallen in last 6 months? Yes. 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  Widened AC joint  consistent with prior distal clavicle excision.  Degenerative spurring of  the acromion.  High riding humeral head.   PATIENT SURVEYS:  Patient-Specific Activity Scoring Scheme  0 represents "unable to perform." 10 represents "able to perform at prior level. 0 1 2 3 4 5 6 7 8 9  10 (Date and Score)   Activity Eval  11/19/23 12/31/2023   1. Lifting the Rt arm (>8.5 milk) 2  5   2. Lift arm easily (reaching) 6   6  3. Reaching behind me going through belt loops 6 8  4.    5.    Score 4.6 6.33   Total score = sum of the activity scores/number of activities Minimum detectable change (90%CI) for average score = 2 points Minimum detectable change (90%CI) for single activity score = 3 points  COGNITION: Eval Overall cognitive status: [facility]      UPPER EXTREMITY ROM:   ROM Right Eval 11/19/23 active Left Eval 11/19/23 active Right 12/31/2023 AROM in supine Rt 01/15/24 AROM supine  Shoulder flexion 55 c pain 158 158 160  Shoulder extension 60 65    Shoulder abduction 80 c pain 156 155 160  Shoulder adduction      Shoulder internal rotation   55 AROM in 60 deg abduction 55 AROM in 60 deg abduction  Shoulder external rotation 48 65 70 AROM in 60 deg abduction 68 AROM in 60 deg abduction,  70 deg passively  Elbow flexion      Elbow extension      Wrist flexion      Wrist extension      Wrist ulnar deviation      Wrist radial deviation      Wrist pronation      Wrist supination      (Blank rows  = not tested)  UPPER EXTREMITY MMT:  MMT Right Eval 11/19/23 Left Eval 11/19/23 Rt 01/15/24  Shoulder flexion 4.2# 23.4# 7.4#  Shoulder extension     Shoulder abduction 4.3# 15.4# 6.4#  Shoulder adduction     Shoulder internal rotation     Shoulder external rotation 5.0# 13.3# 8.6#  Middle trapezius     Lower trapezius     Elbow flexion     Elbow extension     Wrist flexion     Wrist extension     Wrist ulnar deviation     Wrist radial deviation     Wrist pronation  Wrist supination     Grip strength (lbs)     (Blank rows = not tested)  SHOULDER SPECIAL TESTS: Eval: Impingement tests: Hawkins/Kennedy impingement test: positive                                                                                                                                                                                                   TODAY'S TREATMENT:                                                                              DATE: 01/21/2024 TherEx:  UBE with bilat UE only level 2 for 3 min fwd/3 min back  ER walkouts 1x8 then into active ER with green TB 1x8 , difficult AROM with  IR walkouts 1x8 then into active IR with green TB  Standing horizontal abduction with red TB 2x8  TherAct: Wall ladder into shoulder flexion x10, scaption/abduction x10  Standing rows with blue TB 2x12 with 2s hold  Standing straight arm pulldowns with green TB 2x12 with 2s hold  Reaching to cabinet with 1# DB to all 3 shelves 1x10     TODAY'S TREATMENT:                                                                              DATE: 01/15/2024 TherEx Shoulder flexion wall slides bil using towel x 10,  Shoulder scaption wall slides with towel x 10  Shoulder stability shoulder circles with ball on the wall x 15 each direction AA shoulder abd c 1 # bar x 10  IR c green TB  2 x 10  MMT and ROM updated TherAct UBE: 3 minutes each direction Level 2  Rows: blue TB x 15 holding 3 sec Shoulder  extension: x 15 holding 3 sec ER walk outs: green TB c towel under elbow 2 x 10 Reaching 1# onto clinic 1st shelf x 10 and then 2nd shelf    TODAY'S TREATMENT:  DATE:  12/31/2023 Therex:   Standing wall flexion bilateral UE slides 2-3 sec hold x 10  Supine Rt shoulder AROM ER in 80 deg abduction 5 sec stretch x 10, IR x 10  UBE fwd/back 3 mins each way lvl 3.0  Review of HEP c cues for updated handout.   Neuro Re-ed (scapular control, muscle activation) Tband rows c scapular retraction bilateral green band 2 x 15 Tband GH ext green band bilateral 2 x 15  Standing green band ER walk outs with arm at side with towel 5 sec hold x 10 bilaterally    TODAY'S TREATMENT:                                                                              DATE: 12/24/2023 TherEx Shoulder isometrics x 10 holding 10 sec (flexion, extension, ER and IR) Upper trap stretch: x 5 holding 10 sec Levator scapulae stretch x 5 holding 10 sec Supine ER, using bar 2  x 10  TherAct UBE: 3 minutes each direction Level 2  Rows: blue TB x 15 holding 3 sec Shoulder extension: x 15 holding 3 sec Standing shoulder flexion c 1 # bar  2 x 10  Standing shoulder abduction  2 x 10 c cues to      PATIENT EDUCATION: Education details: HEP, POC Person educated: Patient Education method: Programmer, Multimedia, Facilities Manager, Verbal cues, and Handouts Education comprehension: verbalized understanding, returned demonstration, and verbal cues required  HOME EXERCISE PROGRAM: Access Code: [email or web address] URL: [email or web address] Date: 12/31/2023 Prepared by: Ozell Silvan  Exercises - Supine Shoulder External Rotation in 45 Degrees Abduction AAROM with Dowel  - 1-2 x daily - 7 x weekly - 10 reps - Standing Shoulder Row with Anchored Resistance  - 1-2 x daily - 7 x weekly - 2 sets - 10 reps - 3 seconds hold - Standing Shoulder Internal  Rotation Stretch with Towel  - 1-2 x daily - 7 x weekly - 10 reps - Standing Shoulder Extension with Dowel  - 1 x daily - 7 x weekly - 3 sets - 10 reps - Isometric Shoulder Flexion at Wall  - 1-2 x daily - 7 x weekly - 10 reps - 5 seconds hold - Isometric Shoulder Extension at Wall  - 1-2 x daily - 7 x weekly - 10 reps - 5 seconds hold - Seated Gentle Upper Trapezius Stretch  - 1-2 x daily - 7 x weekly - 3 reps - 10 seconds hold - Gentle Levator Scapulae Stretch  - 1-2 x daily - 7 x weekly - 3 reps - 10 seconds hold - Shoulder External Rotation Reactive Isometrics (Mirrored)  - 1-2 x daily - 7 x weekly - 1 sets - 10 reps - 5-15 hold - Supine Shoulder External Rotation Stretch (Mirrored)  - 1-2 x daily - 7 x weekly - 1 sets - 10 reps - 5 hold - Supine Shoulder Internal Rotation Stretch (Mirrored)  - 1-2 x daily - 7 x weekly - 1 sets - 10 reps - 5 hold  ASSESSMENT:  CLINICAL IMPRESSION: Patient arrived to session noting no pain or soreness, though can feel the difference in strength between the Rt vs Lt. Patient tolerated all  activities this date with only one instance of increased soreness in anterior Rt shoulder with horizontal abduction that improved with verbal cues for posture. Patient will continue to benefit from skilled PT.     OBJECTIVE IMPAIRMENTS: decreased ROM, decreased strength, impaired UE functional use, and pain.   ACTIVITY LIMITATIONS: lifting, dressing, reach over head, and hygiene/grooming  PARTICIPATION LIMITATIONS: driving, community activity, occupation, and yard work  PERSONAL FACTORS: 3+ comorbidities: see PMH above are also affecting patient's functional outcome.   REHAB POTENTIAL: Good  CLINICAL DECISION MAKING: Stable/uncomplicated  EVALUATION COMPLEXITY: Low   GOALS: Goals reviewed with patient? Yes  SHORT TERM GOALS: (target date for Short term goals are 3 weeks 12/10/2023)  1.Patient will demonstrate independent use of home exercise program to maintain  progress from in clinic treatments. Goal status: Met  LONG TERM GOALS: (target dates for all long term goals are 10 weeks  02/01/2024 )   1. Patient will demonstrate/report pain at worst less than or equal to 2/10 to facilitate minimal limitation in daily activity secondary to pain symptoms. Goal status: on going 12/31/2023   2. Patient will demonstrate independent use of home exercise program to facilitate ability to maintain/progress functional gains from skilled physical therapy services. Goal status: on going 12/31/2023   3. Patient will demonstrate Patient specific functional scale avg > or = 6.6 to indicate reduced disability due to condition.  Goal status: on going 12/31/2023   4.  Patient will demonstrate Rt UE MMT >/= 5 pounds throughout to facilitate lifting, reaching, carrying at PLOF in daily activity.   Goal status: on going 12/31/2023   5.  Patient will demonstrate Rt GH joint AROM WFL s symptoms to facilitate usual overhead reaching, self care, dressing at PLOF.    Goal status: on going 12/31/2023   6. Pt will improve Rt shoulder ER to >/=  65 degrees to facilitate functional mobility tasks.  Goal status: Met 12/31/2023     PLAN:  PT FREQUENCY: 1x/week  PT DURATION: 10 weeks  PLANNED INTERVENTIONS: Can include 02853- PT Re-evaluation, 97110-Therapeutic exercises, 97530- Therapeutic activity, 97112- Neuromuscular re-education, 97535- Self Care, 97140- Manual therapy, [phone]- Gait training, [phone]- Orthotic Fit/training, [phone]- Canalith repositioning, [id number]- Aquatic Therapy, [phone]- Electrical stimulation (unattended), [id number] Physical performance testing, 97016- Vasopneumatic device, [id number]- Ultrasound, [id number]- Traction (mechanical), [id number]- Ionotophoresis 4mg /ml Dexamethasone,  20560 - Needle insertion w/o injection 1 or 2 muscles, 20561 - Needle insertion w/o injection 3 or more muscles.   Patient/Family education, Balance training, Stair training, Taping, Dry Needling,  Joint mobilization, Joint manipulation, Spinal manipulation, Spinal mobilization, Scar mobilization, Vestibular training, Visual/preceptual remediation/compensation, DME instructions, Cryotherapy, and Moist heat.  All performed as medically necessary.  All included unless contraindicated  PLAN FOR NEXT SESSION:, DISCHARGE , Functional strengthening, IR and ER strengthening and ROM as tolerated   Susannah Daring, PT, DPT 01/21/24 2:31 PM      Date of referral: 10/23/23 Referring provider: Jerri Kay HERO, MD  Referring diagnosis? M25.511,G89.29 (ICD-10-CM) - Chronic right shoulder pain Treatment diagnosis? (if different than referring diagnosis) M25.11, M62.81, R60.0  What was this (referring dx) caused by? Fall and Arthritis  Nature of Condition: Initial Onset (within last 3 months)   Laterality: Rt  Current Functional Measure Score: Patient Specific Functional Scale 4.6  Objective measurements identify impairments when they are compared to normal values, the uninvolved extremity, and prior level of function.  [x]  Yes  []  No  Objective assessment of functional ability: Moderate functional limitations in Rt UE  Briefly describe symptoms: Decreased ROM, strength and functional mobility with pain noted with movements in Rt UE  How did symptoms start: Pt reporting a couple of falls out of his bed during a dream where he fell on his Rt shoulder.   Average pain intensity:  Last 24 hours: 6/10  Past week: up to 8/10  How often does the pt experience symptoms? Frequently  How much have the symptoms interfered with usual daily activities? Moderately  How has condition changed since care began at this facility? NA - initial visit  In general, how is the patients overall health? Good   BACK PAIN (STarT Back Screening Tool) No

## 2024-01-21 ENCOUNTER — Ambulatory Visit

## 2024-01-21 DIAGNOSIS — M542 Cervicalgia: Secondary | ICD-10-CM

## 2024-01-21 DIAGNOSIS — M25511 Pain in right shoulder: Secondary | ICD-10-CM

## 2024-01-21 DIAGNOSIS — R293 Abnormal posture: Secondary | ICD-10-CM

## 2024-01-21 DIAGNOSIS — M6281 Muscle weakness (generalized): Secondary | ICD-10-CM | POA: Diagnosis not present

## 2024-01-21 DIAGNOSIS — R600 Localized edema: Secondary | ICD-10-CM | POA: Diagnosis not present

## 2024-01-25 NOTE — Therapy (Signed)
 OUTPATIENT PHYSICAL THERAPY TREATMENT   Patient Name: Philip Woodard MRN: [id number] DOB:June 12, 1950, 73 y.o., male Today's Date: 01/28/2024  END OF SESSION:  PT End of Session - 01/28/24 1346     Visit Number 6    Number of Visits 10    Date for Recertification  02/01/24    Authorization Type [employer]/ Medicare $20 copay    Authorization Time Period 11/19/2023-01/28/2024    Authorization - Visit Number 6    Authorization - Number of Visits 10    Progress Note Due on Visit 10    PT Start Time 1348    PT Stop Time 1427    PT Time Calculation (min) 39 min    Activity Tolerance Patient tolerated treatment well    Behavior During Therapy [facility] for tasks assessed/performed           Past Medical History:  Diagnosis Date   Alkaline phosphatase raised 06/28/2011   Fluctuating - suspect due to sarcoidosis   Allergic rhinitis    Allergy     Arthritis    knees   Genital herpes    GERD (gastroesophageal reflux disease)    Hemorrhoids    Hyperglycemia    Hyperplastic lymph node 06/27/2011   Submental node excised 1/02; regrowth: re-excision 10/06   Hypertension    Hypothyroidism    Multinodular goiter    Rheumatoid arteritis (HCC)    Routine general medical examination at a health care facility    Sarcoidosis    Sleep apnea    wears cpap    Past Surgical History:  Procedure Laterality Date   COLONOSCOPY  5-23/2003   COLONOSCOPY  02/04/2018   TA   COLONOSCOPY WITH PROPOFOL  05/26/2021   Gwendlyn Buddy at [facility]   POLYPECTOMY     ROTATOR CUFF REPAIR Bilateral [id number]   THYROIDECTOMY  2002   Patient Active Problem List   Diagnosis Date Noted   Umbilical hernia without obstruction and without gangrene 09/17/2023   Anemia 03/19/2023   Bilateral carpal tunnel syndrome 08/21/2022   NCGS (non-celiac gluten sensitivity) 01/25/2022   Internal hemorrhoids 01/25/2022   Cervical radiculopathy 10/17/2021   Lower urinary tract symptoms 07/20/2021   Chronic idiopathic gout involving  toe of left foot without tophus 04/27/2021   Stage 3a chronic kidney disease (HCC) 03/31/2021   Erectile dysfunction due to arterial insufficiency 03/31/2021   NASH (nonalcoholic steatohepatitis) 02/22/2015   Arthritis of right lower extremity 07/20/2014   Hearing loss 02/19/2014   OSA on CPAP 05/02/2013   Hypothyroid 03/29/2012   Primary hypertension 11/24/2008   HYPOTHYROIDISM, POSTSURGICAL 07/07/2008   HYPERTROPHY PROSTATE W/UR OBST & OTH LUTS 07/07/2008   DISC DISEASE, LUMBAR 12/27/2007   Sarcoidosis 06/06/2007   GOITER, MULTINODULAR 06/06/2007   Hemorrhoids 06/06/2007   Asthma 06/06/2007    PCP: Purcell Emil Schanz, MD   REFERRING PROVIDER: Jerri Kay HERO, MD   REFERRING DIAG:  Diagnosis  M25.511,G89.29 (ICD-10-CM) - Chronic right shoulder pain    THERAPY DIAG:  Acute pain of right shoulder  Muscle weakness (generalized)  Localized edema  Cervicalgia  Abnormal posture  Rationale for Evaluation and Treatment: Rehabilitation  ONSET DATE: 6 months  SUBJECTIVE:  SUBJECTIVE STATEMENT:   PERTINENT HISTORY: History of Rotator cuff surgeries bil. HTN, RA, see PM above  PAIN:  NPRS scale: 0/10 Pain location: Rt shoulder Pain description: achy, stiffness, sharp at times Aggravating factors: raising Rt arm Relieving factors: ice  PRECAUTIONS: None  WEIGHT BEARING RESTRICTIONS: No  FALLS:  Has patient fallen in last 6 months? Yes. 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  Widened AC joint  consistent with prior distal clavicle excision.  Degenerative spurring of  the acromion.  High riding humeral head.   PATIENT SURVEYS:  Patient-Specific Activity Scoring Scheme  0 represents "unable to perform." 10 represents "able to perform at prior level. 0 1 2 3 4 5 6 7 8 9  10 (Date and Score)   Activity Eval  11/19/23 12/31/2023  01/28/2024  1. Lifting the Rt arm (>8.5 milk) 2  5  6   2. Lift arm easily (reaching) 6   6 9   3. Reaching behind me going through belt loops 6 8 9   4.     5.     Score 4.6 6.33 8   Total score = sum of the activity scores/number of activities Minimum detectable change (90%CI) for average score = 2 points Minimum detectable change (90%CI) for single activity score = 3 points  COGNITION: Eval Overall cognitive status: [facility]      UPPER EXTREMITY ROM:   ROM Right Eval 11/19/23 active Left Eval 11/19/23 active Right 12/31/2023 AROM in supine Rt 01/15/24 AROM supine  Shoulder flexion 55 c pain 158 158 160  Shoulder extension 60 65    Shoulder abduction 80 c pain 156 155 160  Shoulder adduction      Shoulder internal rotation   55 AROM in 60 deg abduction 55 AROM in 60 deg abduction  Shoulder external rotation 48 65 70 AROM in 60 deg abduction 68 AROM in 60 deg abduction,  70 deg passively  Elbow flexion      Elbow extension      Wrist flexion      Wrist extension      Wrist ulnar deviation      Wrist radial deviation      Wrist pronation      Wrist supination      (Blank rows = not  tested)  UPPER EXTREMITY MMT:  MMT Right Eval 11/19/23 Left Eval 11/19/23 Rt 01/15/24  Shoulder flexion 4.2# 23.4# 7.4#  Shoulder extension     Shoulder abduction 4.3# 15.4# 6.4#  Shoulder adduction     Shoulder internal rotation     Shoulder external rotation 5.0# 13.3# 8.6#  Middle trapezius     Lower trapezius     Elbow flexion     Elbow extension     Wrist flexion     Wrist extension     Wrist ulnar deviation     Wrist radial deviation     Wrist pronation     Wrist supination  Grip strength (lbs)     (Blank rows = not tested)  SHOULDER SPECIAL TESTS: Eval: Impingement tests: Hawkins/Kennedy impingement test: positive                                                                                                                                                                                                   TODAY'S TREATMENT:                                                                              DATE: 01/28/2024 TherEx:  Wall ladder into shoulder flexion x10, scaption/abduction x10  UBE with bilat UE only level 2 for 3 min fwd/3 min back  Shoulder flexion into horizontal abduction then down and then reverse 2x5 with 2# DBs Standing shoulder scaption 2x6 with 2# DBs  Cable machine rows 2x12 with 3s hold with 20#  Bicep curls 1x15 each direction with 5# DBs (neutral, supinated)  TODAY'S TREATMENT:                                                                              DATE: 01/21/2024 TherEx:  UBE with bilat UE only level 2 for 3 min fwd/3 min back  ER walkouts 1x8 then into active ER with green TB 1x8 , difficult AROM with  IR walkouts 1x8 then into active IR with green TB  Standing horizontal abduction with red TB 2x8  TherAct: Wall ladder into shoulder flexion x10, scaption/abduction x10  Standing rows with blue TB 2x12 with 2s hold  Standing straight arm pulldowns with green TB 2x12 with 2s hold  Reaching to cabinet with 1# DB to all 3 shelves  1x10     TODAY'S TREATMENT:  DATE: 01/15/2024 TherEx Shoulder flexion wall slides bil using towel x 10,  Shoulder scaption wall slides with towel x 10  Shoulder stability shoulder circles with ball on the wall x 15 each direction AA shoulder abd c 1 # bar x 10  IR c green TB  2 x 10  MMT and ROM updated TherAct UBE: 3 minutes each direction Level 2  Rows: blue TB x 15 holding 3 sec Shoulder extension: x 15 holding 3 sec ER walk outs: green TB c towel under elbow 2 x 10 Reaching 1# onto clinic 1st shelf x 10 and then 2nd shelf    TODAY'S TREATMENT:                                                                              DATE:  12/31/2023 Therex:   Standing wall flexion bilateral UE slides 2-3 sec hold x 10  Supine Rt shoulder AROM ER in 80 deg abduction 5 sec stretch x 10, IR x 10  UBE fwd/back 3 mins each way lvl 3.0  Review of HEP c cues for updated handout.   Neuro Re-ed (scapular control, muscle activation) Tband rows c scapular retraction bilateral green band 2 x 15 Tband GH ext green band bilateral 2 x 15  Standing green band ER walk outs with arm at side with towel 5 sec hold x 10 bilaterally       PATIENT EDUCATION: Education details: HEP, POC Person educated: Patient Education method: Programmer, Multimedia, Demonstration, Verbal cues, and Handouts Education comprehension: verbalized understanding, returned demonstration, and verbal cues required  HOME EXERCISE PROGRAM: Access Code: [email or web address] URL: [email or web address] Date: 12/31/2023 Prepared by: Ozell Silvan  Exercises - Supine Shoulder External Rotation in 45 Degrees Abduction AAROM with Dowel  - 1-2 x daily - 7 x weekly - 10 reps - Standing Shoulder Row with Anchored Resistance  - 1-2 x daily - 7 x weekly - 2 sets - 10 reps - 3 seconds hold - Standing Shoulder Internal Rotation Stretch with Towel  - 1-2 x daily - 7 x  weekly - 10 reps - Standing Shoulder Extension with Dowel  - 1 x daily - 7 x weekly - 3 sets - 10 reps - Isometric Shoulder Flexion at Wall  - 1-2 x daily - 7 x weekly - 10 reps - 5 seconds hold - Isometric Shoulder Extension at Wall  - 1-2 x daily - 7 x weekly - 10 reps - 5 seconds hold - Seated Gentle Upper Trapezius Stretch  - 1-2 x daily - 7 x weekly - 3 reps - 10 seconds hold - Gentle Levator Scapulae Stretch  - 1-2 x daily - 7 x weekly - 3 reps - 10 seconds hold - Shoulder External Rotation Reactive Isometrics (Mirrored)  - 1-2 x daily - 7 x weekly - 1 sets - 10 reps - 5-15 hold - Supine Shoulder External Rotation Stretch (Mirrored)  - 1-2 x daily - 7 x weekly - 1 sets - 10 reps - 5 hold - Supine Shoulder Internal Rotation Stretch (Mirrored)  - 1-2 x daily - 7 x weekly - 1 sets - 10 reps - 5 hold  ASSESSMENT:  CLINICAL IMPRESSION: Patient arrived to  session noting continued improvement with no pain. Patient tolerated all activities this date and feels comfortable with best ways to continue strengthening in the future. Patient is an appropriate candidate for discharge this date.      OBJECTIVE IMPAIRMENTS: decreased ROM, decreased strength, impaired UE functional use, and pain.   ACTIVITY LIMITATIONS: lifting, dressing, reach over head, and hygiene/grooming  PARTICIPATION LIMITATIONS: driving, community activity, occupation, and yard work  PERSONAL FACTORS: 3+ comorbidities: see PMH above are also affecting patient's functional outcome.   REHAB POTENTIAL: Good  CLINICAL DECISION MAKING: Stable/uncomplicated  EVALUATION COMPLEXITY: Low   GOALS: Goals reviewed with patient? Yes  SHORT TERM GOALS: (target date for Short term goals are 3 weeks 12/10/2023)  1.Patient will demonstrate independent use of home exercise program to maintain progress from in clinic treatments. Goal status: Met  LONG TERM GOALS: (target dates for all long term goals are 10 weeks  02/01/2024 )   1.  Patient will demonstrate/report pain at worst less than or equal to 2/10 to facilitate minimal limitation in daily activity secondary to pain symptoms. Goal status: GOAL MET, 01/28/2024   2. Patient will demonstrate independent use of home exercise program to facilitate ability to maintain/progress functional gains from skilled physical therapy services. Goal status: GOAL MET, 01/28/2024   3. Patient will demonstrate Patient specific functional scale avg > or = 6.6 to indicate reduced disability due to condition.  Goal status: GOAL MET, 01/28/2024   4.  Patient will demonstrate Rt UE MMT >/= 5 pounds throughout to facilitate lifting, reaching, carrying at PLOF in daily activity.   Goal status: GOAL MET, 01/28/2024   5.  Patient will demonstrate Rt GH joint AROM WFL s symptoms to facilitate usual overhead reaching, self care, dressing at PLOF.    Goal status: on going 12/31/2023   6. Pt will improve Rt shoulder ER to >/=  65 degrees to facilitate functional mobility tasks.  Goal status: Met 12/31/2023     PLAN:  PT FREQUENCY: 1x/week  PT DURATION: 10 weeks  PLANNED INTERVENTIONS: Can include 02853- PT Re-evaluation, 97110-Therapeutic exercises, 97530- Therapeutic activity, 97112- Neuromuscular re-education, 97535- Self Care, 97140- Manual therapy, [phone]- Gait training, [phone]- Orthotic Fit/training, [phone]- Canalith repositioning, [id number]- Aquatic Therapy, [phone]- Electrical stimulation (unattended), [id number] Physical performance testing, 97016- Vasopneumatic device, [id number]- Ultrasound, [id number]- Traction (mechanical), [id number]- Ionotophoresis 4mg /ml Dexamethasone,  20560 - Needle insertion w/o injection 1 or 2 muscles, 20561 - Needle insertion w/o injection 3 or more muscles.   Patient/Family education, Balance training, Stair training, Taping, Dry Needling, Joint mobilization, Joint manipulation, Spinal manipulation, Spinal mobilization, Scar mobilization, Vestibular training, Visual/preceptual  remediation/compensation, DME instructions, Cryotherapy, and Moist heat.  All performed as medically necessary.  All included unless contraindicated  PLAN FOR NEXT SESSION:,   PHYSICAL THERAPY DISCHARGE SUMMARY  Visits from Start of Care: 6  Current functional level related to goals / functional outcomes: See above    Remaining deficits: See above    Education / Equipment: Discussed final HEP and important exercises to    Patient agrees to discharge. Patient goals were met. Patient is being discharged due to meeting the stated rehab goals.   Susannah Daring, PT, DPT 01/28/24 2:34 PM      Date of referral: 10/23/23 Referring provider: Jerri Kay HERO, MD  Referring diagnosis? M25.511,G89.29 (ICD-10-CM) - Chronic right shoulder pain Treatment diagnosis? (if different than referring diagnosis) M25.11, M62.81, R60.0  What was this (referring dx) caused by? Fall and Arthritis  Nature of Condition: Initial Onset (  within last 3 months)   Laterality: Rt  Current Functional Measure Score: Patient Specific Functional Scale 4.6  Objective measurements identify impairments when they are compared to normal values, the uninvolved extremity, and prior level of function.  [x]  Yes  []  No  Objective assessment of functional ability: Moderate functional limitations in Rt UE   Briefly describe symptoms: Decreased ROM, strength and functional mobility with pain noted with movements in Rt UE  How did symptoms start: Pt reporting a couple of falls out of his bed during a dream where he fell on his Rt shoulder.   Average pain intensity:  Last 24 hours: 6/10  Past week: up to 8/10  How often does the pt experience symptoms? Frequently  How much have the symptoms interfered with usual daily activities? Moderately  How has condition changed since care began at this facility? NA - initial visit  In general, how is the patients overall health? Good   BACK PAIN (STarT Back Screening  Tool) No

## 2024-01-28 ENCOUNTER — Ambulatory Visit

## 2024-01-28 DIAGNOSIS — R600 Localized edema: Secondary | ICD-10-CM | POA: Diagnosis not present

## 2024-01-28 DIAGNOSIS — M25511 Pain in right shoulder: Secondary | ICD-10-CM

## 2024-01-28 DIAGNOSIS — M6281 Muscle weakness (generalized): Secondary | ICD-10-CM

## 2024-01-28 DIAGNOSIS — R293 Abnormal posture: Secondary | ICD-10-CM

## 2024-01-28 DIAGNOSIS — M542 Cervicalgia: Secondary | ICD-10-CM | POA: Diagnosis not present

## 2024-03-04 NOTE — Progress Notes (Signed)
 "  Office Visit Note  Patient: Philip Woodard             Date of Birth: Mar 31, 1950           MRN: [id number]             PCP: Purcell Emil Schanz, MD Referring: Purcell Emil Schanz, MD Visit Date: 03/18/2024 Occupation: Data Unavailable  Subjective:  Medication management  History of Present Illness: Philip Woodard is a 73 y.o. male with polymyalgia rheumatica, osteoarthritis, gout and sarcoidosis.  He returns today after his last visit in July 2025.  He has not had any flares of sarcoidosis.  He denies any shortness of breath or rash.  He denies any increased muscular weakness or tenderness.  He has been going to the gym on a regular basis and having no difficulty doing routine activities.  He has not had a gout flare.  He is on hydroxychloroquine  200 mg twice a day .  He has an eye examination scheduled in January 2026.    Activities of Daily Living:  Patient reports morning stiffness for 10-15 minutes.   Patient Denies nocturnal pain.  Difficulty dressing/grooming: Denies Difficulty climbing stairs: Denies Difficulty getting out of chair: Denies Difficulty using hands for taps, buttons, cutlery, and/or writing: Denies  Review of Systems  Constitutional:  Negative for fatigue.  HENT:  Negative for mouth sores and mouth dryness.   Eyes:  Negative for dryness.  Respiratory:  Negative for shortness of breath.   Cardiovascular:  Negative for chest pain and palpitations.  Gastrointestinal:  Negative for blood in stool, constipation and diarrhea.  Endocrine: Negative for increased urination.  Genitourinary:  Negative for involuntary urination.  Musculoskeletal:  Positive for morning stiffness. Negative for joint pain, gait problem, joint pain, joint swelling, myalgias, muscle weakness, muscle tenderness and myalgias.  Skin:  Negative for color change, rash and sensitivity to sunlight.  Allergic/Immunologic: Negative for susceptible to infections.  Neurological:  Negative for  dizziness and headaches.  Hematological:  Negative for swollen glands.  Psychiatric/Behavioral:  Negative for depressed mood and sleep disturbance. The patient is not nervous/anxious.     PMFS History:  Patient Active Problem List   Diagnosis Date Noted   Umbilical hernia without obstruction and without gangrene 09/17/2023   Anemia 03/19/2023   Bilateral carpal tunnel syndrome 08/21/2022   NCGS (non-celiac gluten sensitivity) 01/25/2022   Internal hemorrhoids 01/25/2022   Cervical radiculopathy 10/17/2021   Lower urinary tract symptoms 07/20/2021   Chronic idiopathic gout involving toe of left foot without tophus 04/27/2021   Stage 3a chronic kidney disease (HCC) 03/31/2021   Erectile dysfunction due to arterial insufficiency 03/31/2021   NASH (nonalcoholic steatohepatitis) 02/22/2015   Arthritis of right lower extremity 07/20/2014   Hearing loss 02/19/2014   OSA on CPAP 05/02/2013   Hypothyroid 03/29/2012   Primary hypertension 11/24/2008   HYPOTHYROIDISM, POSTSURGICAL 07/07/2008   HYPERTROPHY PROSTATE W/UR OBST & OTH LUTS 07/07/2008   DISC DISEASE, LUMBAR 12/27/2007   Sarcoidosis 06/06/2007   GOITER, MULTINODULAR 06/06/2007   Hemorrhoids 06/06/2007   Asthma 06/06/2007    Past Medical History:  Diagnosis Date   Alkaline phosphatase raised 06/28/2011   Fluctuating - suspect due to sarcoidosis   Allergic rhinitis    Allergy     Arthritis    knees   Genital herpes    GERD (gastroesophageal reflux disease)    Hemorrhoids    Hyperglycemia    Hyperplastic lymph node 06/27/2011   Submental node excised 1/02;  regrowth: re-excision 10/06   Hypertension    Hypothyroidism    Multinodular goiter    Rheumatoid arteritis (HCC)    Routine general medical examination at a health care facility    Sarcoidosis    Sleep apnea    wears cpap     Family History  Problem Relation Age of Onset   Hypertension Mother    Dementia Father    Sleep apnea Brother    Healthy Brother     Sleep apnea Paternal Aunt    Asthma Cousin    Healthy Daughter    Healthy Daughter    Sarcoidosis Neg Hx    Colon polyps Neg Hx    Colon cancer Neg Hx    Esophageal cancer Neg Hx    Stomach cancer Neg Hx    Rectal cancer Neg Hx    Past Surgical History:  Procedure Laterality Date   COLONOSCOPY  5-23/2003   COLONOSCOPY  02/04/2018   TA   COLONOSCOPY WITH PROPOFOL   05/26/2021   Gwendlyn Buddy at [facility]   POLYPECTOMY     ROTATOR CUFF REPAIR Bilateral [id number]   THYROIDECTOMY  2002   Social History[1] Social History   Social History Narrative   Right handed   Caffeine: 1-2 cups/day    Lives at home with wife and daughter     Immunization History  Administered Date(s) Administered   Fluad Quad(high Dose 65+) 12/11/2018, 01/20/2020, 03/31/2021, 01/25/2022   Fluad Trivalent(High Dose 65+) 03/19/2023   INFLUENZA, HIGH DOSE SEASONAL PF 11/14/2017   Influenza Whole 12/11/2009, 12/11/2011   Influenza,inj,Quad PF,6+ Mos 01/31/2013, 12/10/2013, 02/22/2016, 12/19/2016   Moderna Sars-Covid-2 Vaccination 03/18/2020   PFIZER(Purple Top)SARS-COV-2 Vaccination 04/04/2019, 04/25/2019   PNEUMOCOCCAL CONJUGATE-20 01/25/2022   Pneumococcal Conjugate-13 11/14/2017   Pneumococcal Polysaccharide-23 01/31/2013   Td 10/23/2005   Tdap 02/22/2016   Zoster, Live 01/31/2013     Objective: Vital Signs: BP 132/76   Pulse 80   Temp 98.3 F (36.8 C)   Resp 16   Ht 5' 7 (1.702 m)   Wt 212 lb 9.6 oz (96.4 kg)   BMI 33.30 kg/m    Physical Exam Vitals and nursing note reviewed.  Constitutional:      Appearance: He is well-developed.  HENT:     Head: Normocephalic and atraumatic.  Eyes:     Conjunctiva/sclera: Conjunctivae normal.     Pupils: Pupils are equal, round, and reactive to light.  Cardiovascular:     Rate and Rhythm: Normal rate and regular rhythm.     Heart sounds: Normal heart sounds.  Pulmonary:     Effort: Pulmonary effort is normal.     Breath sounds: Normal breath  sounds.  Abdominal:     General: Bowel sounds are normal.     Palpations: Abdomen is soft.  Musculoskeletal:     Cervical back: Normal range of motion and neck supple.  Skin:    General: Skin is warm and dry.     Capillary Refill: Capillary refill takes less than 2 seconds.  Neurological:     Mental Status: He is alert and oriented to person, place, and time.  Psychiatric:        Behavior: Behavior normal.      Musculoskeletal Exam: Cervical, thoracic and lumbar spine were in good range of motion.  There was no SI joint tenderness.  Shoulder joints, right elbow contracture was noted.  Wrist joints, MCPs, PIPs and DIPs were in good range of motion with no synovitis.  Hip joints and  knee joints were in good range of motion without any warmth swelling or effusion.  There was no tenderness over ankles or MTPs.   CDAI Exam: CDAI Score: -- Patient Global: --; Provider Global: -- Swollen: --; Tender: -- Joint Exam 03/18/2024   No joint exam has been documented for this visit   There is currently no information documented on the homunculus. Go to the Rheumatology activity and complete the homunculus joint exam.  Investigation: No additional findings.  Imaging: No results found.  Recent Labs: Lab Results  Component Value Date   WBC 8.0 09/11/2023   HGB 10.1 (L) 09/11/2023   PLT 293 09/11/2023   NA 141 09/11/2023   K 4.1 09/11/2023   CL 107 09/11/2023   CO2 28 09/11/2023   GLUCOSE 84 09/11/2023   BUN 22 09/11/2023   CREATININE 1.22 09/11/2023   BILITOT 0.7 09/11/2023   ALKPHOS 91 03/19/2023   AST 25 09/11/2023   ALT 22 09/11/2023   PROT 6.9 09/11/2023   ALBUMIN 4.4 03/19/2023   CALCIUM 9.3 09/11/2023   GFRAA  02/20/2009    >60        The eGFR has been calculated using the MDRD equation. This calculation has not been validated in all clinical situations. eGFR's persistently <60 mL/min signify possible Chronic Kidney Disease.   [facility] NEGATIVE 07/08/2021     Speciality Comments: PLQ Eye Exam 12/01/2022 normal [facility] f/u 12 months. MTX-dcd due to increase in Cr PLQ eye exam scheduled for January 2026 per patient.  Procedures:  No procedures performed Allergies: Other and Shellfish allergy    Assessment / Plan:     Visit Diagnoses: Polymyalgia rheumatica - Diagnosed by his PCP.  He has been off prednisone  since 2023.  He denies any muscular weakness or tenderness.  He has no difficulty doing routine activities.  He has been on hydroxychloroquine  200 mg twice daily.  No joint swelling or synovitis was noted.  Sarcoidosis - Positive lymph node biopsy 2001.  He denies shortness of breath.  Lungs are clear to auscultation.  He denies any rash.  Last chest x-ray was June 02, 2021.  Patient was advised to see his pulmonologist.  He has scheduled a follow-up appointment.  Chronic idiopathic gout involving toe of left foot without tophus-controlled with diet.  He is not had any gout flare.  I will check uric acid level today.  High risk medication use - Plaquenil  200 mg p.o. twice daily.  PLQ Eye Exam 12/01/2022.  Patient reports that an eye examination is scheduled for January 2025.  Labs obtained on September 11, 2023 showed hemoglobin of 10.1.  Patient states he did not have workup for anemia.  Will recheck labs today.  CMP was normal.  Information regarding medications placed in the AVS.  Elevated CK - CK531 on January 10, 2022.  Myositis panel and HMG CR antibodies negative and August 2024.  No muscular weakness or tenderness was noted.  CK was 361 on September 11, 2023.  I will check CK again today.  Chondromalacia patellae, right knee-currently asymptomatic.  DDD (degenerative disc disease), cervical-8 good range of motion without discomfort.  Degeneration of intervertebral disc of lumbar region without discogenic back pain or lower extremity pain-he denies any discomfort or radiculopathy.  NASH (nonalcoholic steatohepatitis)-LFTs were  normal in July 2025.  Stage 3a chronic kidney disease (HCC) - Followed by nephrology.  Creatinine remains mildly elevated.  Other medical problems are listed as follows:  History of asthma  Primary hypertension-blood pressure  was elevated at 144/81.  Repeat blood pressure was 132/76.  He was advised to monitor blood pressure closely and follow-up with his PCP.  History of hypothyroidism - Status post thyroidectomy at 2002  Vitamin D  deficiency  OSA on CPAP - -followed by neurology.  Orders: Orders Placed This Encounter  Procedures   CBC with Differential/Platelet   Comprehensive metabolic panel with GFR   CK   Uric acid   No orders of the defined types were placed in this encounter.    Follow-Up Instructions: Return in about 5 months (around 08/16/2024) for PMR, OA, Gout.   Maya Nash, MD  Note - This record has been created using Animal nutritionist.  Chart creation errors have been sought, but may not always  have been located. Such creation errors do not reflect on  the standard of medical care.     [1]  Social History Tobacco Use   Smoking status: Never    Passive exposure: Never   Smokeless tobacco: Never  Vaping Use   Vaping status: Never Used  Substance Use Topics   Alcohol use: Yes    Comment: 3 times per month   Drug use: Never   "

## 2024-03-13 ENCOUNTER — Encounter: Payer: Self-pay | Admitting: Emergency Medicine

## 2024-03-18 ENCOUNTER — Encounter: Payer: Self-pay | Admitting: Rheumatology

## 2024-03-18 ENCOUNTER — Ambulatory Visit: Attending: Rheumatology | Admitting: Rheumatology

## 2024-03-18 VITALS — BP 132/76 | HR 80 | Temp 98.3°F | Resp 16 | Ht 67.0 in | Wt 212.6 lb

## 2024-03-18 DIAGNOSIS — M1A0720 Idiopathic chronic gout, left ankle and foot, without tophus (tophi): Secondary | ICD-10-CM

## 2024-03-18 DIAGNOSIS — K7581 Nonalcoholic steatohepatitis (NASH): Secondary | ICD-10-CM | POA: Diagnosis not present

## 2024-03-18 DIAGNOSIS — M51369 Other intervertebral disc degeneration, lumbar region without mention of lumbar back pain or lower extremity pain: Secondary | ICD-10-CM | POA: Diagnosis not present

## 2024-03-18 DIAGNOSIS — D869 Sarcoidosis, unspecified: Secondary | ICD-10-CM

## 2024-03-18 DIAGNOSIS — M353 Polymyalgia rheumatica: Secondary | ICD-10-CM

## 2024-03-18 DIAGNOSIS — N1831 Chronic kidney disease, stage 3a: Secondary | ICD-10-CM

## 2024-03-18 DIAGNOSIS — M2241 Chondromalacia patellae, right knee: Secondary | ICD-10-CM | POA: Diagnosis not present

## 2024-03-18 DIAGNOSIS — M5030 Other cervical disc degeneration, unspecified cervical region: Secondary | ICD-10-CM

## 2024-03-18 DIAGNOSIS — Z8709 Personal history of other diseases of the respiratory system: Secondary | ICD-10-CM

## 2024-03-18 DIAGNOSIS — I10 Essential (primary) hypertension: Secondary | ICD-10-CM

## 2024-03-18 DIAGNOSIS — Z79899 Other long term (current) drug therapy: Secondary | ICD-10-CM | POA: Diagnosis not present

## 2024-03-18 DIAGNOSIS — R748 Abnormal levels of other serum enzymes: Secondary | ICD-10-CM | POA: Diagnosis not present

## 2024-03-18 DIAGNOSIS — G4733 Obstructive sleep apnea (adult) (pediatric): Secondary | ICD-10-CM

## 2024-03-18 DIAGNOSIS — Z8639 Personal history of other endocrine, nutritional and metabolic disease: Secondary | ICD-10-CM

## 2024-03-18 DIAGNOSIS — E559 Vitamin D deficiency, unspecified: Secondary | ICD-10-CM

## 2024-03-18 LAB — CBC WITH DIFFERENTIAL/PLATELET
Absolute Lymphocytes: 2706 {cells}/uL (ref 850–3900)
Absolute Monocytes: 738 {cells}/uL (ref 200–950)
Basophils Absolute: 57 {cells}/uL (ref 0–200)
Basophils Relative: 0.7 %
Eosinophils Absolute: 221 {cells}/uL (ref 15–500)
Eosinophils Relative: 2.7 %
HCT: 40.6 % (ref 39.4–51.1)
Hemoglobin: 12.9 g/dL — ABNORMAL LOW (ref 13.2–17.1)
MCH: 26.4 pg — ABNORMAL LOW (ref 27.0–33.0)
MCHC: 31.8 g/dL (ref 31.6–35.4)
MCV: 83 fL (ref 81.4–101.7)
MPV: 10.3 fL (ref 7.5–12.5)
Monocytes Relative: 9 %
Neutro Abs: 4477 {cells}/uL (ref 1500–7800)
Neutrophils Relative %: 54.6 %
Platelets: 308 Thousand/uL (ref 140–400)
RBC: 4.89 Million/uL (ref 4.20–5.80)
RDW: 15.5 % — ABNORMAL HIGH (ref 11.0–15.0)
Total Lymphocyte: 33 %
WBC: 8.2 Thousand/uL (ref 3.8–10.8)

## 2024-03-18 LAB — COMPREHENSIVE METABOLIC PANEL WITH GFR
AG Ratio: 1.7 (calc) (ref 1.0–2.5)
ALT: 26 U/L (ref 9–46)
AST: 33 U/L (ref 10–35)
Albumin: 4.5 g/dL (ref 3.6–5.1)
Alkaline phosphatase (APISO): 86 U/L (ref 35–144)
BUN/Creatinine Ratio: 15 (calc) (ref 6–22)
BUN: 20 mg/dL (ref 7–25)
CO2: 30 mmol/L (ref 20–32)
Calcium: 9.6 mg/dL (ref 8.6–10.3)
Chloride: 102 mmol/L (ref 98–110)
Creat: 1.37 mg/dL — ABNORMAL HIGH (ref 0.70–1.28)
Globulin: 2.6 g/dL (ref 1.9–3.7)
Glucose, Bld: 101 mg/dL — ABNORMAL HIGH (ref 65–99)
Potassium: 4.8 mmol/L (ref 3.5–5.3)
Sodium: 139 mmol/L (ref 135–146)
Total Bilirubin: 0.7 mg/dL (ref 0.2–1.2)
Total Protein: 7.1 g/dL (ref 6.1–8.1)
eGFR: 55 mL/min/1.73m2 — ABNORMAL LOW

## 2024-03-18 LAB — CK: Total CK: 713 U/L — ABNORMAL HIGH (ref 19–278)

## 2024-03-18 LAB — URIC ACID: Uric Acid, Serum: 5.4 mg/dL (ref 4.0–8.0)

## 2024-03-18 NOTE — Patient Instructions (Addendum)
 Please schedule a follow-up visit your pulmonologist  Vaccines You are taking a medication(s) that can suppress your immune system.  The following immunizations are recommended: Flu annually Covid-19  Td/Tdap (tetanus, diphtheria, pertussis) every 10 years Pneumonia (Prevnar 15 then Pneumovax 23 at least 1 year apart.  Alternatively, can take Prevnar 20 without needing additional dose) Shingrix: 2 doses from 4 weeks to 6 months apart  Please check with your PCP to make sure you are up to date.

## 2024-03-19 ENCOUNTER — Ambulatory Visit: Payer: Self-pay | Admitting: Rheumatology

## 2024-03-19 ENCOUNTER — Encounter: Payer: Self-pay | Admitting: Emergency Medicine

## 2024-03-19 ENCOUNTER — Ambulatory Visit (INDEPENDENT_AMBULATORY_CARE_PROVIDER_SITE_OTHER): Admitting: Emergency Medicine

## 2024-03-19 VITALS — BP 124/78 | HR 81 | Temp 98.1°F | Ht 67.0 in | Wt 212.0 lb

## 2024-03-19 DIAGNOSIS — M2141 Flat foot [pes planus] (acquired), right foot: Secondary | ICD-10-CM | POA: Diagnosis not present

## 2024-03-19 DIAGNOSIS — M353 Polymyalgia rheumatica: Secondary | ICD-10-CM | POA: Diagnosis not present

## 2024-03-19 DIAGNOSIS — N1831 Chronic kidney disease, stage 3a: Secondary | ICD-10-CM | POA: Diagnosis not present

## 2024-03-19 DIAGNOSIS — D869 Sarcoidosis, unspecified: Secondary | ICD-10-CM

## 2024-03-19 DIAGNOSIS — M2142 Flat foot [pes planus] (acquired), left foot: Secondary | ICD-10-CM | POA: Diagnosis not present

## 2024-03-19 DIAGNOSIS — I10 Essential (primary) hypertension: Secondary | ICD-10-CM

## 2024-03-19 DIAGNOSIS — G4733 Obstructive sleep apnea (adult) (pediatric): Secondary | ICD-10-CM | POA: Diagnosis not present

## 2024-03-19 DIAGNOSIS — K429 Umbilical hernia without obstruction or gangrene: Secondary | ICD-10-CM

## 2024-03-19 DIAGNOSIS — E890 Postprocedural hypothyroidism: Secondary | ICD-10-CM | POA: Diagnosis not present

## 2024-03-19 DIAGNOSIS — K7581 Nonalcoholic steatohepatitis (NASH): Secondary | ICD-10-CM | POA: Diagnosis not present

## 2024-03-19 NOTE — Patient Instructions (Signed)
 Health Maintenance After Age 72 After age 27, you are at a higher risk for certain long-term diseases and infections as well as injuries from falls. Falls are a major cause of broken bones and head injuries in people who are older than age 73. Getting regular preventive care can help to keep you healthy and well. Preventive care includes getting regular testing and making lifestyle changes as recommended by your health care provider. Talk with your health care provider about: Which screenings and tests you should have. A screening is a test that checks for a disease when you have no symptoms. A diet and exercise plan that is right for you. What should I know about screenings and tests to prevent falls? Screening and testing are the best ways to find a health problem early. Early diagnosis and treatment give you the best chance of managing medical conditions that are common after age 90. Certain conditions and lifestyle choices may make you more likely to have a fall. Your health care provider may recommend: Regular vision checks. Poor vision and conditions such as cataracts can make you more likely to have a fall. If you wear glasses, make sure to get your prescription updated if your vision changes. Medicine review. Work with your health care provider to regularly review all of the medicines you are taking, including over-the-counter medicines. Ask your health care provider about any side effects that may make you more likely to have a fall. Tell your health care provider if any medicines that you take make you feel dizzy or sleepy. Strength and balance checks. Your health care provider may recommend certain tests to check your strength and balance while standing, walking, or changing positions. Foot health exam. Foot pain and numbness, as well as not wearing proper footwear, can make you more likely to have a fall. Screenings, including: Osteoporosis screening. Osteoporosis is a condition that causes  the bones to get weaker and break more easily. Blood pressure screening. Blood pressure changes and medicines to control blood pressure can make you feel dizzy. Depression screening. You may be more likely to have a fall if you have a fear of falling, feel depressed, or feel unable to do activities that you used to do. Alcohol  use screening. Using too much alcohol  can affect your balance and may make you more likely to have a fall. Follow these instructions at home: Lifestyle Do not drink alcohol  if: Your health care provider tells you not to drink. If you drink alcohol : Limit how much you have to: 0-1 drink a day for women. 0-2 drinks a day for men. Know how much alcohol  is in your drink. In the [place] one drink equals one 12 oz bottle of beer (355 mL), one 5 oz glass of wine (148 mL), or one 1 oz glass of hard liquor (44 mL). Do not use any products that contain nicotine or tobacco. These products include cigarettes, chewing tobacco, and vaping devices, such as e-cigarettes. If you need help quitting, ask your health care provider. Activity  Follow a regular exercise program to stay fit. This will help you maintain your balance. Ask your health care provider what types of exercise are appropriate for you. If you need a cane or walker, use it as recommended by your health care provider. Wear supportive shoes that have nonskid soles. Safety  Remove any tripping hazards, such as rugs, cords, and clutter. Install safety equipment such as grab bars in bathrooms and safety rails on stairs. Keep rooms and walkways  well-lit. General instructions Talk with your health care provider about your risks for falling. Tell your health care provider if: You fall. Be sure to tell your health care provider about all falls, even ones that seem minor. You feel dizzy, tiredness (fatigue), or off-balance. Take over-the-counter and prescription medicines only as told by your health care provider. These include  supplements. Eat a healthy diet and maintain a healthy weight. A healthy diet includes low-fat dairy products, low-fat (lean) meats, and fiber from whole grains, beans, and lots of fruits and vegetables. Stay current with your vaccines. Schedule regular health, dental, and eye exams. Summary Having a healthy lifestyle and getting preventive care can help to protect your health and wellness after age 15. Screening and testing are the best way to find a health problem early and help you avoid having a fall. Early diagnosis and treatment give you the best chance for managing medical conditions that are more common for people who are older than age 42. Falls are a major cause of broken bones and head injuries in people who are older than age 64. Take precautions to prevent a fall at home. Work with your health care provider to learn what changes you can make to improve your health and wellness and to prevent falls. This information is not intended to replace advice given to you by your health care provider. Make sure you discuss any questions you have with your health care provider. Document Revised: 07/19/2020 Document Reviewed: 07/19/2020 Elsevier Patient Education  2024 [employer].

## 2024-03-19 NOTE — Assessment & Plan Note (Signed)
Stable.  Diet and nutrition discussed. 

## 2024-03-19 NOTE — Assessment & Plan Note (Addendum)
 Last GFR 55 Advised to stay well-hydrated and avoid NSAIDs is much as possible

## 2024-03-19 NOTE — Assessment & Plan Note (Signed)
 As per rheumatology's note yesterday:  Polymyalgia rheumatica - Diagnosed by his PCP.  He has been off prednisone  since 2023.  He denies any muscular weakness or tenderness.  He has no difficulty doing routine activities.  He has been on hydroxychloroquine  200 mg twice daily.  No joint swelling or synovitis was noted.

## 2024-03-19 NOTE — Progress Notes (Signed)
 Hemoglobin is better at 12.9.  CK is elevated at 713.  Most likely related to working out at the gym.  I recommend repeating CK in a month after stopping lifting weights for 1 week.  Creatinine is elevated at 1.37.  GFR is low at 55.  Uric acid is in the desirable range at 5.4.  Please forward results to his nephrologist and PCP.

## 2024-03-19 NOTE — Assessment & Plan Note (Signed)
 Clinically euthyroid Continues Synthroid  200 mcg daily

## 2024-03-19 NOTE — Assessment & Plan Note (Signed)
Stable.  On CPAP treatment. Recently seen by neurologist Office visit notes reviewed

## 2024-03-19 NOTE — Progress Notes (Signed)
 Philip Woodard 74 y.o.   Chief Complaint  Patient presents with   Follow-up    Pt has concerns about his feet being feel flat and would like to talk with someone about getting the inserts, pt also has been having some dizzy spells , pt also states that his navel is getting bigger    HISTORY OF PRESENT ILLNESS: This is a 74 y.o. male here for 31-month follow-up of hypertension Also has umbilical hernia.  Getting bigger.  Needs referral to general surgeon Needs referral to podiatrist for flatfeet Has occasional dizzy spells.  Mild brief episodes mostly when bringing head back up from lower position. No other complaints or medical concerns today. BP Readings from Last 3 Encounters:  03/19/24 126/80  03/18/24 132/76  09/17/23 122/64  Seen by rheumatologist yesterday.  Office visit notes assessment and plan as follows:  Assessment / Plan:     Visit Diagnoses: Polymyalgia rheumatica - Diagnosed by his PCP.  He has been off prednisone  since 2023.  He denies any muscular weakness or tenderness.  He has no difficulty doing routine activities.  He has been on hydroxychloroquine  200 mg twice daily.  No joint swelling or synovitis was noted.   Sarcoidosis - Positive lymph node biopsy 2001.  He denies shortness of breath.  Lungs are clear to auscultation.  He denies any rash.  Last chest x-ray was June 02, 2021.  Patient was advised to see his pulmonologist.  He has scheduled a follow-up appointment.   Chronic idiopathic gout involving toe of left foot without tophus-controlled with diet.  He is not had any gout flare.  I will check uric acid level today.   High risk medication use - Plaquenil  200 mg p.o. twice daily.  PLQ Eye Exam 12/01/2022.  Patient reports that an eye examination is scheduled for January 2025.  Labs obtained on September 11, 2023 showed hemoglobin of 10.1.  Patient states he did not have workup for anemia.  Will recheck labs today.  CMP was normal.  Information regarding medications  placed in the AVS.   Elevated CK - CK531 on January 10, 2022.  Myositis panel and HMG CR antibodies negative and August 2024.  No muscular weakness or tenderness was noted.  CK was 361 on September 11, 2023.  I will check CK again today.   Chondromalacia patellae, right knee-currently asymptomatic.   DDD (degenerative disc disease), cervical-8 good range of motion without discomfort.   Degeneration of intervertebral disc of lumbar region without discogenic back pain or lower extremity pain-he denies any discomfort or radiculopathy.   NASH (nonalcoholic steatohepatitis)-LFTs were normal in July 2025.   Stage 3a chronic kidney disease (HCC) - Followed by nephrology.  Creatinine remains mildly elevated.   Other medical problems are listed as follows:   History of asthma   Primary hypertension-blood pressure was elevated at 144/81.  Repeat blood pressure was 132/76.  He was advised to monitor blood pressure closely and follow-up with his PCP.   History of hypothyroidism - Status post thyroidectomy at 2002   Vitamin D  deficiency   OSA on CPAP - -followed by neurology.   Orders:    Orders Placed This Encounter  Procedures   CBC with Differential/Platelet   Comprehensive metabolic panel with GFR   CK   Uric acid    No orders of the defined types were placed in this encounter.       Follow-Up Instructions: Return in about 5 months (around 08/16/2024) for PMR, OA, Gout.  Maya Nash, MD  HPI   Prior to Admission medications  Medication Sig Start Date End Date Taking? Authorizing Provider  acetaminophen  (TYLENOL ) 650 MG CR tablet Take 650 mg by mouth every 8 (eight) hours as needed.   Yes [provider]  acyclovir  (ZOVIRAX ) 800 MG tablet Take 1 tablet (800 mg total) by mouth daily. 08/31/22  Yes Purcell Emil Schanz, MD  amLODipine  (NORVASC ) 10 MG tablet TAKE 1 TABLET BY MOUTH EVERY DAY 09/24/23  Yes Stefan Karen, Emil Schanz, MD  cholecalciferol (VITAMIN D3) 25 MCG  (1000 UT) tablet Take 1,000 Units by mouth daily.   Yes [provider]  Cyanocobalamin  (VITAMIN B-12 PO) Take by mouth.   Yes [provider]  gabapentin  (NEURONTIN ) 300 MG capsule TAKE 1 CAPSULE BY MOUTH THREE TIMES A DAY 11/12/23  Yes Ryanna Teschner, Emil Schanz, MD  Glucosamine-Chondroit-Vit C-Mn (GLUCOSAMINE 1500 COMPLEX) CAPS Take by mouth daily.   Yes [provider]  hydroxychloroquine  (PLAQUENIL ) 200 MG tablet Take 1 tablet (200 mg total) by mouth 2 (two) times daily. 09/11/23  Yes Deveshwar, Maya, MD  levothyroxine  (SYNTHROID ) 175 MCG tablet TAKE 1 TABLET BY MOUTH EVERY DAY 12/25/23  Yes Zaid Tomes, Emil Schanz, MD  Multiple Vitamin (MULTIVITAMIN) LIQD Take 5 mLs by mouth daily.   Yes [provider]  Omega-3 Fatty Acids (FISH OIL) 1000 MG CAPS Take 2 capsules by mouth daily.   Yes [provider]  pantoprazole  (PROTONIX ) 40 MG tablet Take 1 tablet (40 mg total) by mouth daily. Call office at [phone] to schedule appt for additional refills 09/24/23  Yes Danis, Victory CROME III, MD  sildenafil  (VIAGRA ) 100 MG tablet TAKE 1 TABLET BY MOUTH EVERY DAY AS NEEDED FOR ERECTILE DYSFUNCTION 08/19/23  Yes Timi Reeser, Emil Schanz, MD  tamsulosin  (FLOMAX ) 0.4 MG CAPS capsule Take 1 capsule (0.4 mg total) by mouth daily. 04/02/23  Yes Elizabethann Lackey, Emil Schanz, MD  Turmeric 500 MG TABS Take 1 tablet by mouth 2 (two) times daily.   Yes [provider]  valsartan  (DIOVAN ) 160 MG tablet TAKE 1 TABLET BY MOUTH EVERY DAY 03/12/22  Yes Kenika Sahm, Emil Schanz, MD  zinc gluconate 50 MG tablet Take 50 mg by mouth daily.   Yes [provider]    Allergies[1]  Patient Active Problem List   Diagnosis Date Noted   Umbilical hernia without obstruction and without gangrene 09/17/2023   Anemia 03/19/2023   Bilateral carpal tunnel syndrome 08/21/2022   NCGS (non-celiac gluten sensitivity) 01/25/2022   Internal hemorrhoids 01/25/2022   Cervical radiculopathy 10/17/2021    Lower urinary tract symptoms 07/20/2021   Chronic idiopathic gout involving toe of left foot without tophus 04/27/2021   Stage 3a chronic kidney disease (HCC) 03/31/2021   Erectile dysfunction due to arterial insufficiency 03/31/2021   NASH (nonalcoholic steatohepatitis) 02/22/2015   Arthritis of right lower extremity 07/20/2014   Hearing loss 02/19/2014   OSA on CPAP 05/02/2013   Hypothyroid 03/29/2012   Primary hypertension 11/24/2008   HYPOTHYROIDISM, POSTSURGICAL 07/07/2008   HYPERTROPHY PROSTATE W/UR OBST & OTH LUTS 07/07/2008   DISC DISEASE, LUMBAR 12/27/2007   Sarcoidosis 06/06/2007   GOITER, MULTINODULAR 06/06/2007   Hemorrhoids 06/06/2007   Asthma 06/06/2007    Past Medical History:  Diagnosis Date   Alkaline phosphatase raised 06/28/2011   Fluctuating - suspect due to sarcoidosis   Allergic rhinitis    Allergy     Arthritis    knees   Genital herpes    GERD (gastroesophageal reflux disease)    Hemorrhoids  Hyperglycemia    Hyperplastic lymph node 06/27/2011   Submental node excised 1/02; regrowth: re-excision 10/06   Hypertension    Hypothyroidism    Multinodular goiter    Rheumatoid arteritis (HCC)    Routine general medical examination at a health care facility    Sarcoidosis    Sleep apnea    wears cpap     Past Surgical History:  Procedure Laterality Date   COLONOSCOPY  5-23/2003   COLONOSCOPY  02/04/2018   TA   COLONOSCOPY WITH PROPOFOL   05/26/2021   Gwendlyn Buddy at [facility]   POLYPECTOMY     ROTATOR CUFF REPAIR Bilateral [id number]   THYROIDECTOMY  2002    Social History   Socioeconomic History   Marital status: Married    Spouse name: Not on file   Number of children: 2   Years of education: Not on file   Highest education level: Bachelor's degree (e.g., BA, AB, BS)  Occupational History   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  Tobacco Use   Smoking status: Never    Passive exposure: Never   Smokeless tobacco: Never  Vaping Use    Vaping status: Never Used  Substance and Sexual Activity   Alcohol use: Yes    Comment: 3 times per month   Drug use: Never   Sexual activity: Not on file  Other Topics Concern   Not on file  Social History Narrative   Right handed   Caffeine: 1-2 cups/day    Lives at home with wife and daughter   Social Drivers of Health   Tobacco Use: Low Risk (03/18/2024)   Patient History    Smoking Tobacco Use: Never    Smokeless Tobacco Use: Never    Passive Exposure: Never  Financial Resource Strain: Low Risk (09/16/2023)   Overall Financial Resource Strain (CARDIA)    Difficulty of Paying Living Expenses: Not hard at all  Food Insecurity: No Food Insecurity (09/16/2023)   Epic    Worried About Radiation Protection Practitioner of Food in the Last Year: Never true    Ran Out of Food in the Last Year: Never true  Transportation Needs: No Transportation Needs (09/16/2023)   Epic    Lack of Transportation (Medical): No    Lack of Transportation (Non-Medical): No  Physical Activity: Insufficiently Active (09/16/2023)   Exercise Vital Sign    Days of Exercise per Week: 3 days    Minutes of Exercise per Session: 30 min  Stress: No Stress Concern Present (09/16/2023)   [employer] of Occupational Health - Occupational Stress Questionnaire    Feeling of Stress: Only a little  Social Connections: Socially Integrated (09/16/2023)   Social Connection and Isolation Panel    Frequency of Communication with Friends and Family: Twice a week    Frequency of Social Gatherings with Friends and Family: Once a week    Attends Religious Services: More than 4 times per year    Active Member of Clubs or Organizations: Yes    Attends Banker Meetings: More than 4 times per year    Marital Status: Married  Catering Manager Violence: Not At Risk (04/30/2023)   Humiliation, Afraid, Rape, and Kick questionnaire    Fear of Current or Ex-Partner: No    Emotionally Abused: No    Physically Abused: No    Sexually Abused: No   Depression (PHQ2-9): Low Risk (03/19/2024)   Depression (PHQ2-9)    PHQ-2 Score: 0  Alcohol Screen: Low Risk (09/16/2023)   Alcohol Screen  Last Alcohol Screening Score (AUDIT): 2  Housing: Low Risk (09/16/2023)   Epic    Unable to Pay for Housing in the Last Year: No    Number of Times Moved in the Last Year: 0    Homeless in the Last Year: No  Utilities: Not At Risk (04/30/2023)   AHC Utilities    Threatened with loss of utilities: No  Health Literacy: Adequate Health Literacy (04/30/2023)   B1300 Health Literacy    Frequency of need for help with medical instructions: Never    Family History  Problem Relation Age of Onset   Hypertension Mother    Dementia Father    Sleep apnea Brother    Healthy Brother    Sleep apnea Paternal Aunt    Asthma Cousin    Healthy Daughter    Healthy Daughter    Sarcoidosis Neg Hx    Colon polyps Neg Hx    Colon cancer Neg Hx    Esophageal cancer Neg Hx    Stomach cancer Neg Hx    Rectal cancer Neg Hx      Review of Systems  Constitutional: Negative.  Negative for chills and fever.  HENT: Negative.  Negative for congestion and sore throat.   Respiratory: Negative.  Negative for cough and shortness of breath.   Cardiovascular: Negative.  Negative for chest pain and palpitations.  Gastrointestinal:  Negative for abdominal pain, diarrhea, nausea and vomiting.  Genitourinary: Negative.  Negative for dysuria and hematuria.  Skin: Negative.  Negative for rash.  Neurological: Negative.  Negative for dizziness and headaches.  All other systems reviewed and are negative.   Vitals:   03/19/24 0840  BP: 126/80  Pulse: 81  Temp: 98.1 F (36.7 C)  SpO2: 98%    Physical Exam Vitals reviewed.  Constitutional:      Appearance: Normal appearance.  HENT:     Head: Normocephalic.     Mouth/Throat:     Mouth: Mucous membranes are moist.     Pharynx: Oropharynx is clear.  Eyes:     Extraocular Movements: Extraocular movements intact.      Conjunctiva/sclera: Conjunctivae normal.     Pupils: Pupils are equal, round, and reactive to light.  Cardiovascular:     Rate and Rhythm: Normal rate and regular rhythm.     Pulses: Normal pulses.     Heart sounds: Normal heart sounds.  Pulmonary:     Effort: Pulmonary effort is normal.     Breath sounds: Normal breath sounds.  Abdominal:     Hernia: A hernia (Umbilical hernia.  Nontender) is present.  Musculoskeletal:     Cervical back: No tenderness.  Lymphadenopathy:     Cervical: No cervical adenopathy.  Skin:    General: Skin is warm and dry.     Capillary Refill: Capillary refill takes less than 2 seconds.  Neurological:     General: No focal deficit present.     Mental Status: He is alert and oriented to person, place, and time.  Psychiatric:        Mood and Affect: Mood normal.        Behavior: Behavior normal.      ASSESSMENT & PLAN: A total of 40 minutes was spent with the patient and counseling/coordination of care regarding preparing for this visit, review of most recent office visit notes, review of multiple chronic medical conditions and their management, review of all medications, review of most recent bloodwork results, review of health maintenance items, education on  nutrition, prognosis, documentation, and need for follow up.  Problem List Items Addressed This Visit       Cardiovascular and Mediastinum   Primary hypertension - Primary   BP Readings from Last 3 Encounters:  03/19/24 124/78  03/18/24 132/76  09/17/23 122/64  Well-controlled hypertension. Continue amlodipine  10 mg and valsartan  160 mg daily Cardiovascular risks associated with hypertension discussed Diet and nutrition discussed         Respiratory   OSA on CPAP   Stable.  On CPAP treatment. Recently seen by neurologist Office visit notes reviewed        Digestive   NASH (nonalcoholic steatohepatitis)   Stable. Diet and nutrition discussed.         Endocrine   Hypothyroid    Clinically euthyroid Continues Synthroid  200 mcg daily        Genitourinary   Stage 3a chronic kidney disease (HCC)   Last GFR 55 Advised to stay well-hydrated and avoid NSAIDs is much as possible        Other   Sarcoidosis   Stable and asymptomatic.       Umbilical hernia without obstruction and without gangrene   Clinically stable.  Asymptomatic. Slowly getting bigger.  Recommend general surgery evaluation Referral placed today      Relevant Orders   Ambulatory referral to General Surgery   Polymyalgia rheumatica   As per rheumatology's note yesterday:  Polymyalgia rheumatica - Diagnosed by his PCP.  He has been off prednisone  since 2023.  He denies any muscular weakness or tenderness.  He has no difficulty doing routine activities.  He has been on hydroxychloroquine  200 mg twice daily.  No joint swelling or synovitis was noted.      Other Visit Diagnoses       Pes planus of both feet       Relevant Orders   Ambulatory referral to Podiatry      Patient Instructions  Health Maintenance After Age 53 After age 36, you are at a higher risk for certain long-term diseases and infections as well as injuries from falls. Falls are a major cause of broken bones and head injuries in people who are older than age 43. Getting regular preventive care can help to keep you healthy and well. Preventive care includes getting regular testing and making lifestyle changes as recommended by your health care provider. Talk with your health care provider about: Which screenings and tests you should have. A screening is a test that checks for a disease when you have no symptoms. A diet and exercise plan that is right for you. What should I know about screenings and tests to prevent falls? Screening and testing are the best ways to find a health problem early. Early diagnosis and treatment give you the best chance of managing medical conditions that are common after age 66. Certain conditions  and lifestyle choices may make you more likely to have a fall. Your health care provider may recommend: Regular vision checks. Poor vision and conditions such as cataracts can make you more likely to have a fall. If you wear glasses, make sure to get your prescription updated if your vision changes. Medicine review. Work with your health care provider to regularly review all of the medicines you are taking, including over-the-counter medicines. Ask your health care provider about any side effects that may make you more likely to have a fall. Tell your health care provider if any medicines that you take make you feel dizzy  or sleepy. Strength and balance checks. Your health care provider may recommend certain tests to check your strength and balance while standing, walking, or changing positions. Foot health exam. Foot pain and numbness, as well as not wearing proper footwear, can make you more likely to have a fall. Screenings, including: Osteoporosis screening. Osteoporosis is a condition that causes the bones to get weaker and break more easily. Blood pressure screening. Blood pressure changes and medicines to control blood pressure can make you feel dizzy. Depression screening. You may be more likely to have a fall if you have a fear of falling, feel depressed, or feel unable to do activities that you used to do. Alcohol use screening. Using too much alcohol can affect your balance and may make you more likely to have a fall. Follow these instructions at home: Lifestyle Do not drink alcohol if: Your health care provider tells you not to drink. If you drink alcohol: Limit how much you have to: 0-1 drink a day for women. 0-2 drinks a day for men. Know how much alcohol is in your drink. In the [place] one drink equals one 12 oz bottle of beer (355 mL), one 5 oz glass of wine (148 mL), or one 1 oz glass of hard liquor (44 mL). Do not use any products that contain nicotine or tobacco. These products  include cigarettes, chewing tobacco, and vaping devices, such as e-cigarettes. If you need help quitting, ask your health care provider. Activity  Follow a regular exercise program to stay fit. This will help you maintain your balance. Ask your health care provider what types of exercise are appropriate for you. If you need a cane or walker, use it as recommended by your health care provider. Wear supportive shoes that have nonskid soles. Safety  Remove any tripping hazards, such as rugs, cords, and clutter. Install safety equipment such as grab bars in bathrooms and safety rails on stairs. Keep rooms and walkways well-lit. General instructions Talk with your health care provider about your risks for falling. Tell your health care provider if: You fall. Be sure to tell your health care provider about all falls, even ones that seem minor. You feel dizzy, tiredness (fatigue), or off-balance. Take over-the-counter and prescription medicines only as told by your health care provider. These include supplements. Eat a healthy diet and maintain a healthy weight. A healthy diet includes low-fat dairy products, low-fat (lean) meats, and fiber from whole grains, beans, and lots of fruits and vegetables. Stay current with your vaccines. Schedule regular health, dental, and eye exams. Summary Having a healthy lifestyle and getting preventive care can help to protect your health and wellness after age 2. Screening and testing are the best way to find a health problem early and help you avoid having a fall. Early diagnosis and treatment give you the best chance for managing medical conditions that are more common for people who are older than age 43. Falls are a major cause of broken bones and head injuries in people who are older than age 44. Take precautions to prevent a fall at home. Work with your health care provider to learn what changes you can make to improve your health and wellness and to prevent  falls. This information is not intended to replace advice given to you by your health care provider. Make sure you discuss any questions you have with your health care provider. Document Revised: 07/19/2020 Document Reviewed: 07/19/2020 Elsevier Patient Education  2024 [employer].  Emil Schaumann, MD  Primary Care at [facility]    [1]  Allergies Allergen Reactions   Other     SEAFOOD   Shellfish Allergy  Swelling    Tongue and facial Swelling Tongue and facial Swelling

## 2024-03-19 NOTE — Assessment & Plan Note (Signed)
 Clinically stable.  Asymptomatic. Slowly getting bigger.  Recommend general surgery evaluation Referral placed today

## 2024-03-19 NOTE — Assessment & Plan Note (Signed)
 Stable and asymptomatic

## 2024-03-19 NOTE — Assessment & Plan Note (Signed)
 BP Readings from Last 3 Encounters:  03/19/24 124/78  03/18/24 132/76  09/17/23 122/64  Well-controlled hypertension. Continue amlodipine  10 mg and valsartan  160 mg daily Cardiovascular risks associated with hypertension discussed Diet and nutrition discussed

## 2024-03-20 ENCOUNTER — Telehealth: Payer: Self-pay | Admitting: Pharmacy Technician

## 2024-03-20 NOTE — Progress Notes (Addendum)
" ° °  03/20/2024  Patient ID: Philip Woodard, male   DOB: 04/28/1950, 74 y.o.   MRN: [id number]  Pharmacy Quality Measure Review  This patient  failed the 2025 adherence measure for hypertension (ACEi/ARB) medications this calendar year.   Medication: Valsartan  Last fill date: 10/26/2023 for 90 day supply  Spoke to patient. HIPAA verified. Patient informs he does take Valsartan  and that he takes 1 whole tablet once a day. Patient informs he has medication at home though he is not sure how many tablets are in his bottle. Patient informs he will return the call later when he arrives home to confirm how many tablets he has remaining. Dietrich Ke, CPhT [facility] Office: [phone] Email: [email or web address]   "

## 2024-03-20 NOTE — Progress Notes (Unsigned)
 SABRA

## 2024-03-24 ENCOUNTER — Ambulatory Visit: Payer: Medicare Other | Admitting: Adult Health

## 2024-03-24 ENCOUNTER — Telehealth: Payer: Self-pay | Admitting: Pharmacy Technician

## 2024-03-24 NOTE — Progress Notes (Signed)
" ° °  03/24/2024 Name: Philip Woodard MRN: [id number] DOB: 1951/02/17  Patient Returned the Call Pharmacy Quality Measure Review  This patient failed the 2025 adherence measure for hypertension (ACEi/ARB) medications this calendar year.   Medication: Valsartan  Last fill date: 10/26/2023 for 90 day supply  Patient returned the call from 03/21/23 and left a voicemail after hours. He informs he has a full bottle of 90 tablets remaining.He had previously verbalized that he does take 1 tablet daily as ordered.   Navi Erber, CPhT [facility] Office: [phone] Email: [email or web address]  "

## 2024-03-25 ENCOUNTER — Other Ambulatory Visit: Payer: Self-pay | Admitting: Emergency Medicine

## 2024-03-25 DIAGNOSIS — I10 Essential (primary) hypertension: Secondary | ICD-10-CM

## 2024-03-27 ENCOUNTER — Inpatient Hospital Stay: Payer: Medicare HMO | Attending: Hematology and Oncology | Admitting: Hematology and Oncology

## 2024-03-27 ENCOUNTER — Inpatient Hospital Stay: Payer: Medicare HMO

## 2024-04-08 ENCOUNTER — Ambulatory Visit: Admitting: Podiatry

## 2024-04-15 ENCOUNTER — Ambulatory Visit: Admitting: Podiatry

## 2024-04-15 VITALS — Ht 67.0 in | Wt 212.0 lb

## 2024-04-15 DIAGNOSIS — M2141 Flat foot [pes planus] (acquired), right foot: Secondary | ICD-10-CM

## 2024-04-15 DIAGNOSIS — M2142 Flat foot [pes planus] (acquired), left foot: Secondary | ICD-10-CM

## 2024-04-15 NOTE — Progress Notes (Signed)
"  °  Subjective:  Patient ID: Philip Woodard, male    DOB: 04-Feb-1951,  MRN: [id number]  Chief Complaint  Patient presents with   Foot Orthotics    RM 1 I have very flat feet and need an insert for my shoes. Pt has no additional concerns beyond acquiring custom orthotics.    74 y.o. male presents with the above complaint. History confirmed with patient.  He returns for follow-up today with pain in both feet for flatfoot deformity.  Has been pleased with previous use of orthotic insoles, he got these from his sports medicine doctor about 4 to 5 years ago  Objective:  Physical Exam: warm, good capillary refill, no trophic changes or ulcerative lesions, normal DP and PT pulses, normal sensory exam, and pes planus deformity bilateral hallux valgus.  Nontender nonpainful.  Assessment:     ICD-10-CM   1. Pes planus of both feet  M21.41    M21.42         Plan:  Patient was evaluated and treated and all questions answered.  He returns for follow-up today, would like to replace his orthotic insoles for his flatfoot deformity.  I inspected his current orthotics and these are a prefabricated accommodative style insole.  I discussed this being replaced with either a custom molded insole or a power step style prefabricated orthosis.  We discussed the pros and cons of each discussed that custom orthotics would not be covered by Medicare for him for this issue, and I think peroneal's would fit him well.  Power steps were dispensed and he will utilize them and return to see me as needed for further issues.  Return if symptoms worsen or fail to improve.   "

## 2024-04-18 ENCOUNTER — Other Ambulatory Visit: Payer: Self-pay | Admitting: Surgery

## 2024-04-25 ENCOUNTER — Ambulatory Visit

## 2024-04-30 ENCOUNTER — Ambulatory Visit: Payer: Medicare Other

## 2024-08-20 ENCOUNTER — Ambulatory Visit: Admitting: Rheumatology

## 2024-08-26 ENCOUNTER — Ambulatory Visit: Admitting: Adult Health

## 2024-09-16 ENCOUNTER — Ambulatory Visit: Admitting: Emergency Medicine
